# Patient Record
Sex: Male | Born: 1950 | Race: White | Hispanic: No | Marital: Married | Smoking: Current every day smoker
Health system: Southern US, Community
[De-identification: ages and names within clinical notes are randomized; demographics above are authoritative.]

## PROBLEM LIST (undated history)

## (undated) DIAGNOSIS — I639 Cerebral infarction, unspecified: Secondary | ICD-10-CM

## (undated) DIAGNOSIS — F0390 Unspecified dementia without behavioral disturbance: Secondary | ICD-10-CM

## (undated) DIAGNOSIS — D649 Anemia, unspecified: Secondary | ICD-10-CM

---

## 2009-12-19 ENCOUNTER — Emergency Department (HOSPITAL_COMMUNITY): Admission: EM | Admit: 2009-12-19 | Discharge: 2009-12-19 | Payer: Self-pay | Admitting: Emergency Medicine

## 2021-04-25 ENCOUNTER — Other Ambulatory Visit: Payer: Self-pay | Admitting: Family Medicine

## 2021-04-25 ENCOUNTER — Ambulatory Visit
Admission: RE | Admit: 2021-04-25 | Discharge: 2021-04-25 | Disposition: A | Discharge status: Home / Self Care | Payer: Medicare Other | Source: Ambulatory Visit | Attending: Family Medicine | Admitting: Family Medicine

## 2021-04-25 DIAGNOSIS — Y92009 Unspecified place in unspecified non-institutional (private) residence as the place of occurrence of the external cause: Secondary | ICD-10-CM

## 2021-04-25 DIAGNOSIS — S0990XA Unspecified injury of head, initial encounter: Secondary | ICD-10-CM

## 2021-04-25 DIAGNOSIS — W19XXXA Unspecified fall, initial encounter: Secondary | ICD-10-CM

## 2021-04-26 ENCOUNTER — Other Ambulatory Visit: Payer: Self-pay | Admitting: Family Medicine

## 2021-04-26 DIAGNOSIS — S0990XA Unspecified injury of head, initial encounter: Secondary | ICD-10-CM

## 2021-04-27 ENCOUNTER — Emergency Department (HOSPITAL_COMMUNITY): Payer: Medicare Other

## 2021-04-27 ENCOUNTER — Encounter (HOSPITAL_COMMUNITY): Payer: Self-pay | Admitting: Emergency Medicine

## 2021-04-27 ENCOUNTER — Emergency Department (HOSPITAL_COMMUNITY)
Admission: EM | Admit: 2021-04-27 | Discharge: 2021-04-27 | Disposition: A | Discharge status: Home / Self Care | Payer: Medicare Other | Attending: Emergency Medicine | Admitting: Emergency Medicine

## 2021-04-27 DIAGNOSIS — W010XXA Fall on same level from slipping, tripping and stumbling without subsequent striking against object, initial encounter: Secondary | ICD-10-CM | POA: Diagnosis not present

## 2021-04-27 DIAGNOSIS — M6281 Muscle weakness (generalized): Secondary | ICD-10-CM | POA: Diagnosis not present

## 2021-04-27 DIAGNOSIS — M5442 Lumbago with sciatica, left side: Secondary | ICD-10-CM

## 2021-04-27 DIAGNOSIS — R32 Unspecified urinary incontinence: Secondary | ICD-10-CM | POA: Diagnosis not present

## 2021-04-27 DIAGNOSIS — Y92000 Kitchen of unspecified non-institutional (private) residence as  the place of occurrence of the external cause: Secondary | ICD-10-CM | POA: Diagnosis not present

## 2021-04-27 DIAGNOSIS — M5441 Lumbago with sciatica, right side: Secondary | ICD-10-CM

## 2021-04-27 DIAGNOSIS — M549 Dorsalgia, unspecified: Secondary | ICD-10-CM | POA: Diagnosis not present

## 2021-04-27 DIAGNOSIS — M48061 Spinal stenosis, lumbar region without neurogenic claudication: Secondary | ICD-10-CM

## 2021-04-27 LAB — CBC WITH DIFFERENTIAL/PLATELET
Abs Immature Granulocytes: 0.03 10*3/uL (ref 0.00–0.07)
Basophils Absolute: 0.1 10*3/uL (ref 0.0–0.1)
Basophils Relative: 1 %
Eosinophils Absolute: 0.1 10*3/uL (ref 0.0–0.5)
Eosinophils Relative: 1 %
HCT: 40.6 % (ref 39.0–52.0)
Hemoglobin: 14.4 g/dL (ref 13.0–17.0)
Immature Granulocytes: 0 %
Lymphocytes Relative: 30 %
Lymphs Abs: 2.7 10*3/uL (ref 0.7–4.0)
MCH: 33.6 pg (ref 26.0–34.0)
MCHC: 35.5 g/dL (ref 30.0–36.0)
MCV: 94.6 fL (ref 80.0–100.0)
Monocytes Absolute: 1.2 10*3/uL — ABNORMAL HIGH (ref 0.1–1.0)
Monocytes Relative: 13 %
Neutro Abs: 5 10*3/uL (ref 1.7–7.7)
Neutrophils Relative %: 55 %
Platelets: 309 10*3/uL (ref 150–400)
RBC: 4.29 MIL/uL (ref 4.22–5.81)
RDW: 12.4 % (ref 11.5–15.5)
WBC: 9 10*3/uL (ref 4.0–10.5)
nRBC: 0 % (ref 0.0–0.2)

## 2021-04-27 LAB — COMPREHENSIVE METABOLIC PANEL
ALT: 39 U/L (ref 0–44)
AST: 40 U/L (ref 15–41)
Albumin: 3.8 g/dL (ref 3.5–5.0)
Alkaline Phosphatase: 63 U/L (ref 38–126)
Anion gap: 10 (ref 5–15)
BUN: 12 mg/dL (ref 8–23)
CO2: 24 mmol/L (ref 22–32)
Calcium: 9.4 mg/dL (ref 8.9–10.3)
Chloride: 100 mmol/L (ref 98–111)
Creatinine, Ser: 0.95 mg/dL (ref 0.61–1.24)
GFR, Estimated: >60 mL/min (ref >60)
Glucose, Bld: 101 mg/dL — ABNORMAL HIGH (ref 70–99)
Potassium: 3.9 mmol/L (ref 3.5–5.1)
Sodium: 134 mmol/L — ABNORMAL LOW (ref 135–145)
Total Bilirubin: 1.4 mg/dL — ABNORMAL HIGH (ref 0.3–1.2)
Total Protein: 6.6 g/dL (ref 6.5–8.1)

## 2021-04-27 LAB — URINALYSIS, ROUTINE W REFLEX MICROSCOPIC
Bilirubin Urine: NEGATIVE
Glucose, UA: NEGATIVE mg/dL
Hgb urine dipstick: NEGATIVE
Ketones, ur: 5 mg/dL — AB
Leukocytes,Ua: NEGATIVE
Nitrite: NEGATIVE
Protein, ur: NEGATIVE mg/dL
Specific Gravity, Urine: 1.016 (ref 1.005–1.030)
pH: 5 (ref 5.0–8.0)

## 2021-04-27 LAB — MAGNESIUM: Magnesium: 1.9 mg/dL (ref 1.7–2.4)

## 2021-04-27 MED ORDER — PREDNISONE 20 MG PO TABS: ORAL_TABLET | ORAL | 0 refills | Status: DC

## 2021-04-27 MED ORDER — DEXAMETHASONE SODIUM PHOSPHATE 10 MG/ML IJ SOLN
10.0000 mg | Freq: Once | INTRAMUSCULAR | Status: AC
  Administered 2021-04-27: 10 mg via INTRAVENOUS
  Filled 2021-04-27: qty 1

## 2021-04-27 MED ORDER — LORAZEPAM 2 MG/ML IJ SOLN
0.5000 mg | Freq: Once | INTRAMUSCULAR | Status: AC
  Administered 2021-04-27: 0.5 mg via INTRAVENOUS
  Filled 2021-04-27: qty 1

## 2021-04-27 MED ORDER — GADOBUTROL 1 MMOL/ML IV SOLN
6.5000 mL | Freq: Once | INTRAVENOUS | Status: AC | PRN
  Administered 2021-04-27: 6.5 mL via INTRAVENOUS

## 2021-04-27 NOTE — ED Provider Notes (Signed)
Emergency Medicine Provider Triage Evaluation Note  Ernest Campbell , a 70 y.o. male  was evaluated in triage.  Pt complains of back pain.  States that 1 month ago he fell from atop his kitchen counter, hit his head, and landed on his back.  Was ambulatory and alert and oriented following, was not initially worked up for symptoms.  However, has been having more difficulty ambulating with tightness in bilateral lower extremities and decreased range of motion.  Had a head CT earlier this week that was negative.  Patient has now lost bladder control, which prompted ordering of outpatient MRI.  Patient was unable to tolerate this due to loud noise, was sent to the ER to receive MRI with sedation.   Review of Systems  Positive: Numbness, tingling, loss of bladder control Negative: Fevers, chills  Physical Exam  BP 134/74 (BP Location: Left Arm)   Pulse 89   Temp 97.9 F (36.6 C) (Oral)   Resp 16   Ht 5\' 7"  (1.702 m)   Wt 68 kg   SpO2 100%   BMI 23.49 kg/m  Gen:   Awake, no distress   Resp:  Normal effort  MSK:   Moves extremities without difficulty  Other:  Mild midline tenderness noted to thoracic and lumbar spine.  Medical Decision Making  Medically screening exam initiated at 6:57 PM.  Appropriate orders placed.  Ernest Campbell was informed that the remainder of the evaluation will be completed by another provider, this initial triage assessment does not replace that evaluation, and the importance of remaining in the ED until their evaluation is complete.     Jacob Moores 04/27/21 1900    04/29/21, DO 04/27/21 1950

## 2021-04-27 NOTE — ED Provider Notes (Signed)
[facility] EMERGENCY DEPARTMENT Provider Note   CSN: [id number] Arrival date & time: 04/27/21  1604     History Chief Complaint  Patient presents with   Ernest Campbell is a 70 y.o. male.  Patient presents to the emergency department for evaluation of back pain and falls.  Patient had a fall about 7 weeks ago in his kitchen.  He landed on his back and hit his head.  Over the past 3 weeks patient has had increasing weakness in his lower extremities, falls, and difficulty with ambulation.  He states that he has had a tight sensation in his legs.  Patient follows with [facility].  He was sent for a head CT 2 days ago which was negative for acute bleeding.  Patient had an MRI attempted today but could not tolerate procedure and was sent to the emergency department for work-up.  He denies headaches or neck pain.  No weakness, numbness, or tingling in his arms.  He reports weakness in his legs, especially when trying to pick up his legs.  It is present on both sides but worse on the right.  Denies numbness in his legs.  Patient has had some urinary incontinence at nighttime but feels that he is usually able to control his urine.  No fecal incontinence.  He has had some shortness of breath with exertion but no chest pain.  Wife remarks on steep physical decline over the past several weeks.      History reviewed. No pertinent past medical history.  There are no problems to display for this patient.   History reviewed. No pertinent surgical history.     No family history on file.     Home Medications Prior to Admission medications   Not on File    Allergies    Patient has no known allergies.  Review of Systems   Review of Systems  Constitutional:  Negative for fever and unexpected weight change.  HENT:  Negative for rhinorrhea and sore throat.   Eyes:  Negative for redness.  Respiratory:  Positive for shortness of breath. Negative for  cough.   Cardiovascular:  Negative for chest pain.  Gastrointestinal:  Negative for abdominal pain, constipation, diarrhea, nausea and vomiting.       Neg for fecal incontinence  Genitourinary:  Positive for enuresis (at night). Negative for difficulty urinating, dysuria, flank pain and hematuria.       Negative for urinary incontinence or retention  Musculoskeletal:  Positive for back pain and gait problem. Negative for myalgias.  Skin:  Negative for rash.  Neurological:  Positive for weakness. Negative for numbness and headaches.       Negative for saddle paresthesias    Physical Exam Updated Vital Signs BP 134/74 (BP Location: Left Arm)   Pulse 89   Temp 97.9 F (36.6 C) (Oral)   Resp 16   Ht 5\' 7"  (1.702 m)   Wt 68 kg   SpO2 100%   BMI 23.49 kg/m   Physical Exam Vitals and nursing note reviewed.  Constitutional:      General: He is not in acute distress.    Appearance: He is well-developed.  HENT:     Head: Normocephalic and atraumatic.  Eyes:     General:        Right eye: No discharge.        Left eye: No discharge.     Conjunctiva/sclera: Conjunctivae normal.  Cardiovascular:  Rate and Rhythm: Normal rate and regular rhythm.     Heart sounds: Normal heart sounds.  Pulmonary:     Effort: Pulmonary effort is normal.     Breath sounds: Normal breath sounds.  Abdominal:     Palpations: Abdomen is soft.     Tenderness: There is no abdominal tenderness. There is no right CVA tenderness or left CVA tenderness.  Musculoskeletal:        General: No tenderness. Normal range of motion.     Cervical back: Normal range of motion and neck supple.     Comments: No step-off noted with palpation of spine.   Skin:    General: Skin is warm and dry.  Neurological:     Mental Status: He is alert and oriented to person, place, and time.     Sensory: No sensory deficit.     Motor: No abnormal muscle tone.     Deep Tendon Reflexes: Reflexes are normal and symmetric. Reflexes  normal.     Comments: Patient was 4 out of 5 strength in lower extremities, weakness is worse on the right.  More pronounced weakness with hip flexion on the right.  No sensation deficit.  Normal bilateral patellar reflexes.  Patient is sitting on the side of the bed.  He is unable to stand to pivot to get into bed due to weakness in his legs.  He needed assistance from 2 people to position him in bed.  Psychiatric:        Mood and Affect: Mood normal.    ED Results / Procedures / Treatments   Labs (all labs ordered are listed, but only abnormal results are displayed) Labs Reviewed  CBC WITH DIFFERENTIAL/PLATELET - Abnormal; Notable for the following components:      Result Value   Monocytes Absolute 1.2 (*)    All other components within normal limits  COMPREHENSIVE METABOLIC PANEL - Abnormal; Notable for the following components:   Sodium 134 (*)    Glucose, Bld 101 (*)    Total Bilirubin 1.4 (*)    All other components within normal limits  URINALYSIS, ROUTINE W REFLEX MICROSCOPIC - Abnormal; Notable for the following components:   Ketones, ur 5 (*)    All other components within normal limits  MAGNESIUM    EKG None  Radiology MR THORACIC SPINE W WO CONTRAST  Result Date: 04/27/2021 CLINICAL DATA:  Low back pain, progressive neurologic deficit. EXAM: MRI THORACIC AND LUMBAR SPINE WITH AND WITHOUT CONTRAST TECHNIQUE: Multiplanar and multiecho pulse sequences of the thoracic and lumbar spine were obtained with and without intravenous contrast. CONTRAST:  6.5  ML Gadavist COMPARISON:  None. FINDINGS: Evaluation is somewhat limited by motion artifact. MRI THORACIC SPINE FINDINGS Alignment:  Physiologic. Vertebrae: No acute or chronic fracture, evidence of discitis, or suspicious bone lesion. Cord: Normal signal and morphology. Evaluation for spinal cord enhancement is somewhat limited by significant motion on postcontrast sequences; within this limitation, no abnormal enhancement is  seen. Paraspinal and other soft tissues: Negative. Disc levels: No significant spinal canal stenosis or neural foraminal narrowing. MRI LUMBAR SPINE FINDINGS Segmentation: 5 lumbar type vertebral bodies, with sacralization of L5. Alignment:  Physiologic. Vertebrae: No acute fracture, evidence of discitis, or suspicious osseous lesion. Endplate degenerative changes at L4-L5. no abnormal enhancement. Conus medullaris and cauda equina: Conus extends to the T12 level. Conus and cauda equina appear normal. No abnormal enhancement, although evaluation is somewhat limited by motion artifact affecting the post-contrast sequences. Paraspinal and other soft  tissues: Right renal cyst. Disc levels: T12-L1: Minimal disc bulge. No spinal canal stenosis or neural foraminal narrowing. L1-L2: Mild disc bulge. Mild facet arthropathy. No spinal canal stenosis or neural foraminal narrowing. L2-L3: Mild disc bulge. Mild facet arthropathy. No spinal canal stenosis or neural foraminal narrowing. L3-L4: Mild disc bulge. No spinal canal stenosis or neural foraminal narrowing. L4-L5: Severe disc height loss with broad-based disc bulge. Narrowing of the right lateral recess. Moderate facet arthropathy. No spinal canal stenosis. Mild left-greater-than-right neural foraminal narrowing. L5-S1: No significant disc bulge. Moderate facet arthropathy. No spinal canal stenosis or neural foraminal narrowing. IMPRESSION: MR THORACIC SPINE IMPRESSION Evaluation is somewhat limited by motion artifact. Within this limitation, no acute process in the thoracic spine. No acute or chronic fracture, spinal canal stenosis, or neural foraminal narrowing. MR LUMBAR SPINE IMPRESSION Evaluation is somewhat limited by motion artifact from within limitation, there are degenerative changes in the lumbar spine, worst at L4-L5, where there is significant disc height loss and mild left greater than right neural foraminal narrowing, as well as narrowing of the right  lateral recess, which could affect the descending right L5 nerve. No spinal canal stenosis. Electronically Signed   By: Wiliam Ke M.D.   On: 04/27/2021 22:02   MR Lumbar Spine W Wo Contrast  Result Date: 04/27/2021 CLINICAL DATA:  Low back pain, progressive neurologic deficit. EXAM: MRI THORACIC AND LUMBAR SPINE WITH AND WITHOUT CONTRAST TECHNIQUE: Multiplanar and multiecho pulse sequences of the thoracic and lumbar spine were obtained with and without intravenous contrast. CONTRAST:  6.5  ML Gadavist COMPARISON:  None. FINDINGS: Evaluation is somewhat limited by motion artifact. MRI THORACIC SPINE FINDINGS Alignment:  Physiologic. Vertebrae: No acute or chronic fracture, evidence of discitis, or suspicious bone lesion. Cord: Normal signal and morphology. Evaluation for spinal cord enhancement is somewhat limited by significant motion on postcontrast sequences; within this limitation, no abnormal enhancement is seen. Paraspinal and other soft tissues: Negative. Disc levels: No significant spinal canal stenosis or neural foraminal narrowing. MRI LUMBAR SPINE FINDINGS Segmentation: 5 lumbar type vertebral bodies, with sacralization of L5. Alignment:  Physiologic. Vertebrae: No acute fracture, evidence of discitis, or suspicious osseous lesion. Endplate degenerative changes at L4-L5. no abnormal enhancement. Conus medullaris and cauda equina: Conus extends to the T12 level. Conus and cauda equina appear normal. No abnormal enhancement, although evaluation is somewhat limited by motion artifact affecting the post-contrast sequences. Paraspinal and other soft tissues: Right renal cyst. Disc levels: T12-L1: Minimal disc bulge. No spinal canal stenosis or neural foraminal narrowing. L1-L2: Mild disc bulge. Mild facet arthropathy. No spinal canal stenosis or neural foraminal narrowing. L2-L3: Mild disc bulge. Mild facet arthropathy. No spinal canal stenosis or neural foraminal narrowing. L3-L4: Mild disc bulge. No  spinal canal stenosis or neural foraminal narrowing. L4-L5: Severe disc height loss with broad-based disc bulge. Narrowing of the right lateral recess. Moderate facet arthropathy. No spinal canal stenosis. Mild left-greater-than-right neural foraminal narrowing. L5-S1: No significant disc bulge. Moderate facet arthropathy. No spinal canal stenosis or neural foraminal narrowing. IMPRESSION: MR THORACIC SPINE IMPRESSION Evaluation is somewhat limited by motion artifact. Within this limitation, no acute process in the thoracic spine. No acute or chronic fracture, spinal canal stenosis, or neural foraminal narrowing. MR LUMBAR SPINE IMPRESSION Evaluation is somewhat limited by motion artifact from within limitation, there are degenerative changes in the lumbar spine, worst at L4-L5, where there is significant disc height loss and mild left greater than right neural foraminal narrowing, as well as narrowing  of the right lateral recess, which could affect the descending right L5 nerve. No spinal canal stenosis. Electronically Signed   By: Wiliam Ke M.D.   On: 04/27/2021 22:02    Procedures Procedures   Medications Ordered in ED Medications  dexamethasone (DECADRON) injection 10 mg (has no administration in time range)  LORazepam (ATIVAN) injection 0.5 mg (0.5 mg Intravenous Given 04/27/21 2009)  gadobutrol (GADAVIST) 1 MMOL/ML injection 6.5 mL (6.5 mLs Intravenous Contrast Given 04/27/21 2130)  gadobutrol (GADAVIST) 1 MMOL/ML injection 6.5 mL (6.5 mLs Intravenous Contrast Given 04/27/21 2134)    ED Course  I have reviewed the triage vital signs and the nursing notes.  Pertinent labs & imaging results that were available during my care of the patient were reviewed by me and considered in my medical decision making (see chart for details).  Patient seen and examined. Work-up initiated. MRIs ordered. Will attempt with dose of ativan.   Vital signs reviewed and are as follows: BP 134/74 (BP Location:  Left Arm)   Pulse 89   Temp 97.9 F (36.6 C) (Oral)   Resp 16   Ht 5\' 7"  (1.702 m)   Wt 68 kg   SpO2 100%   BMI 23.49 kg/m   MRI performed with findings as above.  Discussed findings with patient and wife at bedside.  Discussed admission if unable to walk versus discharge to home with close follow-up.  Both patient and wife are comfortable with discharge.  Patient has a rolling walker which he has been using.  Also family member coming into town tomorrow.  He will be closely monitored.  They are willing to follow-up with Ortho or neurosurgery.  Will give referral to neurosurgery.  Will give dose of Decadron 10 mg IV and started on tapered course at home.  Encouraged return to the emergency department with worsening weakness, inability to ambulate or transfer. Patient was counseled on back pain precautions and told to do activity as tolerated but do not lift, push, or pull.    Again reiterated importance of close follow-up and return with worsening.  The patient verbalizes understanding and agrees with the plan.    MDM Rules/Calculators/A&P                           Patient with back pain and lower extremity weakness, concern for cord etiology.  Patient symptoms are subacute at this point with gradual worsening in his gait and mobility.  Sent by orthopedics today given red flag symptoms including loss of bladder control at night.  He does have some weakness in his legs, right greater than left.  Normal reflexes.  No other warning symptoms of back pain including: fecal incontinence, urinary retention, night sweats, waking from sleep with back pain, unexplained fevers or weight loss, h/o cancer, IVDU, recent trauma.  MRI today without any emergent surgical conditions.  He does have lateral recess stenosis at L4/L5 with mild foraminal stenosis.  Otherwise, no cauda equina or other cord compression.  Lab work-up today with normal hemoglobin, UA, electrolytes and kidney function.  Patient is  motivated to go home.  His wife will continue to help take care of him and she is comfortable with discharge.  He has a rolling walker now and his sister is coming into town tomorrow to help.  Wife works at home.  Discussed importance of close follow-up.  They will either follow-up with their orthopedist or neurosurgery referral given.  Discussed signs  symptoms to return.    Final Clinical Impression(s) / ED Diagnoses Final diagnoses:  Acute bilateral low back pain with bilateral sciatica  Stenosis of lateral recess of lumbar spine    Rx / DC Orders ED Discharge Orders          Ordered    predniSONE (DELTASONE) 20 MG tablet        04/27/21 2303             Renne Crigler, PA-C 04/28/21 2346    Ernie Avena, MD 05/01/21 1513

## 2021-04-27 NOTE — ED Triage Notes (Signed)
Patient sent to [facility] from [facility] for evaluation of ongoing symptoms after a fall at the beginning of August. Patient was standing on kitchen counter when he tripped and fell to tile floor, hit his head on another counter top, and landed on his back. Wife states patient immediately stood back up after falling and did not lose consciousness. MRI attempted at [facility] but patient unable to tolerate noise of machine. Wife reports patient has had an increasing number of falls since original event. Patient alert, oriented, and in no apparent distress at this time.

## 2021-04-27 NOTE — Discharge Instructions (Addendum)
Please read and follow all provided instructions.  Your diagnoses today include:  1. Acute bilateral low back pain with bilateral sciatica   2. Stenosis of lateral recess of lumbar spine     Tests performed today include: Vital signs - see below for your results today MRI mid and lower spine - shows significant disc bulging at the L4/L5 level which may be causing some of the symptoms Blood cell counts (white, red, and platelets) Electrolytes  Kidney function test Urine test to check for infection  Medications prescribed:  Prednisone - steroid medicine   It is best to take this medication in the morning to prevent sleeping problems. If you are diabetic, monitor your blood sugar closely and stop taking Prednisone if blood sugar is over 300. Take with food to prevent stomach upset.   Take any prescribed medications only as directed.  Home care instructions:  Follow any educational materials contained in this packet Please rest, use ice or heat on your back for the next several days Do not lift, push, pull anything more than 10 pounds for the next week  Follow-up instructions: Please follow-up with your orthopedist or the neurosurgeon referral listed.   Return instructions:  SEEK IMMEDIATE MEDICAL ATTENTION IF YOU HAVE: New numbness, tingling, weakness, or problem with the use of your arms or legs Severe back pain not relieved with medications Loss control of your bowels or bladder Increasing pain in any areas of the body (such as chest or abdominal pain) Shortness of breath, dizziness, or fainting.  Worsening nausea (feeling sick to your stomach), vomiting, fever, or sweats Any other emergent concerns regarding your health   Additional Information:  Your vital signs today were: BP 107/65 (BP Location: Right Arm)   Pulse 95   Temp (!) 97.1 F (36.2 C) (Oral)   Resp 16   Ht 5\' 7"  (1.702 m)   Wt 68 kg   SpO2 96%   BMI 23.49 kg/m  If your blood pressure (BP) was elevated  above 135/85 this visit, please have this repeated by your doctor within one month. --------------

## 2021-05-10 ENCOUNTER — Other Ambulatory Visit: Payer: Self-pay | Admitting: Family Medicine

## 2021-05-10 DIAGNOSIS — R269 Unspecified abnormalities of gait and mobility: Secondary | ICD-10-CM

## 2021-05-10 DIAGNOSIS — R413 Other amnesia: Secondary | ICD-10-CM

## 2021-05-12 ENCOUNTER — Other Ambulatory Visit: Payer: Self-pay | Admitting: Neurological Surgery

## 2021-05-12 DIAGNOSIS — M4712 Other spondylosis with myelopathy, cervical region: Secondary | ICD-10-CM

## 2021-05-18 ENCOUNTER — Ambulatory Visit
Admission: RE | Admit: 2021-05-18 | Discharge: 2021-05-18 | Disposition: A | Discharge status: Home / Self Care | Payer: Medicare Other | Source: Ambulatory Visit | Attending: Neurological Surgery | Admitting: Neurological Surgery

## 2021-05-18 ENCOUNTER — Ambulatory Visit
Admission: RE | Admit: 2021-05-18 | Discharge: 2021-05-18 | Disposition: A | Discharge status: Home / Self Care | Payer: Medicare Other | Source: Ambulatory Visit | Attending: Family Medicine | Admitting: Family Medicine

## 2021-05-18 ENCOUNTER — Other Ambulatory Visit: Payer: Self-pay

## 2021-05-18 DIAGNOSIS — M4712 Other spondylosis with myelopathy, cervical region: Secondary | ICD-10-CM

## 2021-05-18 DIAGNOSIS — R413 Other amnesia: Secondary | ICD-10-CM

## 2021-05-18 DIAGNOSIS — R269 Unspecified abnormalities of gait and mobility: Secondary | ICD-10-CM

## 2021-05-18 MED ORDER — GADOBENATE DIMEGLUMINE 529 MG/ML IV SOLN
14.0000 mL | Freq: Once | INTRAVENOUS | Status: AC | PRN
  Administered 2021-05-18: 14 mL via INTRAVENOUS

## 2021-07-03 ENCOUNTER — Ambulatory Visit (INDEPENDENT_AMBULATORY_CARE_PROVIDER_SITE_OTHER): Payer: Medicare Other | Admitting: Neurology

## 2021-07-03 ENCOUNTER — Encounter: Payer: Self-pay | Admitting: Neurology

## 2021-07-03 ENCOUNTER — Other Ambulatory Visit: Payer: Self-pay

## 2021-07-03 VITALS — BP 113/62 | HR 82 | Ht 65.0 in | Wt 141.0 lb

## 2021-07-03 DIAGNOSIS — R441 Visual hallucinations: Secondary | ICD-10-CM

## 2021-07-03 DIAGNOSIS — Z87898 Personal history of other specified conditions: Secondary | ICD-10-CM | POA: Insufficient documentation

## 2021-07-03 DIAGNOSIS — H510 Palsy (spasm) of conjugate gaze: Secondary | ICD-10-CM

## 2021-07-03 DIAGNOSIS — G218 Other secondary parkinsonism: Secondary | ICD-10-CM | POA: Insufficient documentation

## 2021-07-03 DIAGNOSIS — S069X0S Unspecified intracranial injury without loss of consciousness, sequela: Secondary | ICD-10-CM

## 2021-07-03 DIAGNOSIS — R2689 Other abnormalities of gait and mobility: Secondary | ICD-10-CM | POA: Insufficient documentation

## 2021-07-03 DIAGNOSIS — S069XAA Unspecified intracranial injury with loss of consciousness status unknown, initial encounter: Secondary | ICD-10-CM | POA: Insufficient documentation

## 2021-07-03 DIAGNOSIS — N3944 Nocturnal enuresis: Secondary | ICD-10-CM

## 2021-07-03 MED ORDER — CARBIDOPA-LEVODOPA 25-100 MG PO TABS: 1.0000 | ORAL_TABLET | Freq: Three times a day (TID) | ORAL | 5 refills | Status: DC

## 2021-07-03 NOTE — Patient Instructions (Signed)
Normal-Pressure Hydrocephalus Normal-pressure hydrocephalus (NPH) is a buildup of fluid (cerebrospinal fluid, or CSF) inside the brain. This does not typically increase the pressure inside the brain (intracranial pressure). CSF is normally present in the brain, but when too much CSF is produced in the brain, it can affect brain function. NPH usually occurs in people who are older than age 68. Many symptoms of NPH are also associated with aging or certain medical conditions. It is important to pay attention to changes in behavior and mental function. What are the causes? This condition may be caused by anything that blocks the flow of CSF, such as: Head injury. Infections. Bleeding from a blood vessel in the brain. In many cases, the cause of this condition is not known. What are the signs or symptoms? Symptoms of this condition may include: Difficulty walking, such as: Shuffling feet when walking. Unsteadiness. Problems when beginning to walk. Feet feeling as though they are stuck or "frozen" to the floor. Problems with bowel and bladder control. Problems with memory and thinking, such as: Forgetfulness. Lack of concentration. Mood problems. Confusion. How is this diagnosed? This condition is diagnosed based on: Your medical history. A physical exam. This can reveal walking (gait) changes or twitching or sudden muscle tightening (spasms) in the legs. Other tests to confirm the diagnosis and identify the best options for treatment. These tests include: Removal and examination of a large amount of CSF (large-volume lumbar puncture). CT scan of your head. MRI scan of your head. Cisternogram of your head. This test involves a lumbar puncture where a radioactive dye is put into your CSF. Nuclear medicine images are taken to see how well the dye is flowing through the network of cavities within your brain (ventricles). How is this treated? This condition may be treated with surgery in which  a narrow tube (ventriculoperitoneal shunt, or VP shunt) is placed in the brain to drain excess CSF into another part of the body. After a shunt is placed, you will be monitored to make sure CSF is draining properly. Depending on your age and overall health condition, you may not be a candidate for surgery. In some cases, a shunt may be placed in the lumbar spine (lumbar peritoneal shunt) instead of the brain. If your symptoms are mild, your condition may be monitored on a regular basis before a decision is made about whether a shunt is needed. Follow these instructions at home: Medicines Take over-the-counter and prescription medicines only as told by your health care provider. Avoid taking medicines that can affect thinking, such as pain or sleeping medicines. Lifestyle Do not use any products that contain nicotine or tobacco. These products include cigarettes, chewing tobacco, and vaping devices, such as e-cigarettes. If you need help quitting, ask your health care provider. Drink enough fluid to keep your urine pale yellow. If you have a shunt: Do not wear tight-fitting hats or headgear. Before having an MRI or abdominal surgery, tell your health care provider that you have a shunt. Watch for signs of infection, such as: Fever. Redness, pain, or swelling of the skin along the shunt path. Headache or stiff neck. Nausea or vomiting. General instructions Work with your health care provider to determine what you need help with and what your safety needs are. Ask your health care provider when you can return to your normal activities. Keep all follow-up visits. This is important. Contact a health care provider if: You have any new symptoms. Your symptoms get worse. Your symptoms do not  improve after surgery. You lose coordination or balance. You or your family members become concerned for your safety. Get help right away if: You have: A fever or other signs of infection. New or worsening  confusion. New or worsening sleepiness. A hard time staying awake. A headache that is getting worse. Blurred vision, especially early in the morning. You start to twitch or shake (seizure). These symptoms may represent a serious problem that is an emergency. Do not wait to see if the symptoms will go away. Get medical help right away. Call your local emergency services (911 in the [place] Do not drive yourself to the hospital. Summary Normal-pressure hydrocephalus (NPH) is a buildup of fluid (cerebrospinal fluid, or CSF) inside the brain. When too much CSF is produced in the brain, it can affect brain function. Anything that blocks the flow of CSF can cause this condition. In some cases, the cause of this condition is not known. This condition may be treated with surgery in which a narrow tube (ventriculoperitoneal shunt, or VP shunt) is placed in the brain to drain excess CSF into another part of the body. This information is not intended to replace advice given to you by your health care provider. Make sure you discuss any questions you have with your health care provider. Document Revised: 06/20/2020 Document Reviewed: 06/20/2020 Elsevier Patient Education  2022 Elsevier Inc. Carbidopa; Levodopa Tablets What is this medication? CARBIDOPA; LEVODOPA (kar bi DOE pa; lee voe DOE pa) treats the symptoms of Parkinson disease. It works by increasing the amount of dopamine in your brain, a substance which helps manage body movements and coordination. This reduces the symptoms of Parkinson, such as body stiffness and tremors. This medicine may be used for other purposes; ask your health care provider or pharmacist if you have questions. COMMON BRAND NAME(S): Atamet, Dhivy, SINEMET What should I tell my care team before I take this medication? They need to know if you have any of these conditions: Depression or other mental illness Diabetes Glaucoma Heart disease, including history of a heart  attack History of irregular heartbeat Kidney disease Liver disease Lung or breathing disease, like asthma Narcolepsy Sleep apnea Stomach or intestine problems An unusual or allergic reaction to levodopa, carbidopa, other medications, foods, dyes, or preservatives Pregnant or trying to get pregnant Breast-feeding How should I use this medication? Take this medication by mouth with a glass of water. Follow the directions on the prescription label. Take your doses at regular intervals. Do not take your medication more often than directed. Do not stop taking except on the advice of your care team. One form of this medication is a breakable tablet. If your care team has prescribed this, this medication comes with INSTRUCTIONS FOR USE. Ask your pharmacist for directions on how to use this medicine. Read the information carefully. Talk to your pharmacist or care team if you have questions. Talk to your care team about the use of this medication in children. Special care may be needed. Overdosage: If you think you have taken too much of this medicine contact a poison control center or emergency room at once. NOTE: This medicine is only for you. Do not share this medicine with others. What if I miss a dose? If you miss a dose, take it as soon as you can. If it is almost time for your next dose, take only that dose. Do not take double or extra doses. What may interact with this medication? Do not take this medication with any  of the following: MAOIs like Marplan, Nardil, and Parnate Reserpine Tetrabenazine This medication may also interact with the following: Alcohol Droperidol Entacapone Iron supplements or multivitamins with iron Isoniazid, INH Linezolid Medications for depression, anxiety, or psychotic disturbances Medications for high blood pressure Medications for sleep Metoclopramide Papaverine Procarbazine Tedizolid Rasagiline Selegiline Tolcapone This list may not describe all  possible interactions. Give your health care provider a list of all the medicines, herbs, non-prescription drugs, or dietary supplements you use. Also tell them if you smoke, drink alcohol, or use illegal drugs. Some items may interact with your medicine. What should I watch for while using this medication? Visit your care team for regular checks on your progress. Tell your care team if your symptoms do not start to get better or if they get worse. Do not stop taking except on your care team's advice. You may develop a severe reaction. Your care team will tell you how much medication to take. You may experience a wearing of effect prior to the time for your next dose of this medication. You may also experience an on-off effect where the medication apparently stops working for anything from a minute to several hours, then suddenly starts working again. Tell your care team if any of these symptoms happen to you. Your dose may need to be changed. A high protein diet can slow or prevent absorption of this medication. Avoid high protein foods near the time of taking this medication to help to prevent these problems. Take this medication at least 30 minutes before eating or one hour after meals. You may want to eat higher protein foods later in the day or in small amounts. Discuss your diet with your care team. You may get drowsy or dizzy. Do not drive, use machinery, or do anything that needs mental alertness until you know how this medication affects you. Do not stand or sit up quickly, especially if you are an older patient. This reduces the risk of dizzy or fainting spells. Alcohol may interfere with the effect of this medication. Avoid alcoholic drinks. When taking this medication, you may fall asleep without notice. You may be doing activities like driving a car, talking, or eating. You may not feel drowsy before it happens. Contact your care team right away if this happens to you. There have been reports of  increased sexual urges or other strong urges such as gambling while taking this medication. If you experience any of these while taking this medication, you should report this to your care team as soon as possible. If you are diabetic, this medication may interfere with the accuracy of some tests for sugar or ketones in the urine (does not interfere with blood tests). Check with your care team before changing the dose of your diabetic medication. This medication may discolor the urine or sweat, making it look darker or red in color. This is of no cause for concern. However, this may stain clothing or fabrics. This medication may cause a decrease in vitamin B6. You should make sure that you get enough vitamin B6 while you are taking this medication. Discuss the foods you eat and the vitamins you take with your care team. What side effects may I notice from receiving this medication? Side effects that you should report to your care team as soon as possible: Allergic reactions--skin rash, itching, hives, swelling of the face, lips, tongue, or throat Falling asleep during daily activities Heart rhythm changes--fast or irregular heartbeat, dizziness, feeling faint or lightheaded, chest  pain, trouble breathing Low blood pressure--dizziness, feeling faint or lightheaded, blurry vision Mood and behavior changes--anxiety, nervousness, confusion, hallucinations, irritability, hostility, thoughts of suicide or self-harm, worsening mood, feelings of depression New or worsening uncontrolled and repetitive movements of the face, mouth, or upper body Stomach bleeding--bloody or black, tar-like stools, vomiting blood or brown material that looks like coffee grounds Sudden eye pain or change in vision such as blurry vision, seeing halos around lights, vision loss Urges to engage in impulsive behaviors such as gambling, binge eating, sexual activity, or shopping in ways that are unusual for you Side effects that  usually do not require medical attention (report to your care team if they continue or are bothersome): Dark red or black saliva, sweat, or urine Dizziness Drowsiness Headache Nausea This list may not describe all possible side effects. Call your doctor for medical advice about side effects. You may report side effects to FDA at 1-800-FDA-1088. Where should I keep my medication? Keep out of the reach of children. Store at room temperature between 15 and 30 degrees C (59 and 86 degrees F). Protect from light. Throw away any unused medication after the expiration date. NOTE: This sheet is a summary. It may not cover all possible information. If you have questions about this medicine, talk to your doctor, pharmacist, or health care provider.  2022 Elsevier/Gold Standard (2021-02-13 00:00:00)

## 2021-07-03 NOTE — Progress Notes (Signed)
[facility]EP [facility]INE [facility]    Provider:  Melvyn Novas, MD  Primary Care Physician:  Pcp, No No address on file     Referring Provider: Dawley, Alan Mulder, Do [address] [place],  [place] [address]          Chief Complaint according to patient   Patient presents with:     New Patient (Initial Visit)           HISTORY OF PRESENT ILLNESS:  Ernest Campbell is a 70 y.o. year old White or Caucasian male patient seen here in the presence of his spouse, upon consultation request/  referral on 07/03/2021 from PCP .   Chief concern according to patient's wife: The patient fell from a kitchen counter on which she worked backwards and hit his head so this was a quite significant traumatic impact also initially neither his personality nor his abilities for change.  Within 3 weeks or so the spells noted a decline in gait stability, some more forgetfulness and memory lapses and also a change in facial expression.  He was not himself.  The patient presentedv to his orthopedic surgeon Dr. Althea Charon.  From there he was referred for an MRI of the brain but he could not tolerate lying flat in the loud machine and therefore had to do it under sedation at the hospital.  At the hospital there was also an MRI of the spine all 3 compartments were reviewed cervical, lumbar and thoracic.  He was treated with steroids also I am not sure what the treated disease was.  He did have some lower back pain. 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  Rather mild canal stenosis.  Based on these findings and May 18, 2021 the patient had been started on steroids.  He was started I on steroids and became delirious. S he wouldn't sleep and would crawl on the floor, was not responding to verbal stimuli.     Ernest Campbell has no medical HISTORY abstracted in EPIC.     The  patent is a smoker, he hasn't been drinking alcohol  for 2 decades.  Retired from  [employer], since 2016.at age 60, worked outdoors a lot. Caffeine - 2 coffees a day, in AM.   Family medical /sleep history: no  other family member on CPAP with OSA, insomnia, sleep walkers.    Social history:  Patient is lives in a household with spouse, childless,  married for 40 years.  Pets; one cat. Tobacco use yes.  ETOH use moderate not in years.        Review of Systems: Out of a complete 14 system review, the patient complains of only the following symptoms, and all other reviewed systems are negative.:   Memory, falls and urinary incontinence. NPH post fall ?    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  Dentures.  Masked face.  Edentulous.  Laquered tongue.    Falls. Shuffling.  Bedwetting  Misplacing things, trouble folding clothes.   Social History   Socioeconomic History   Marital status: Married    Spouse name: Not on file   Number of children: Not on file   Years of education: Not on file   Highest education level: Not on file  Occupational History   Not on file  Tobacco Use  Smoking status: Every Day    Packs/day: 0.25    Types: Cigarettes   Smokeless tobacco: Not on file  Substance and Sexual Activity   Alcohol use: Never   Drug use: Never   Sexual activity: Not on file  Other Topics Concern   Not on file  Social History Narrative   Not on file   Social Determinants of Health   Financial Resource Strain: Not on file  Food Insecurity: Not on file  Transportation Needs: Not on file  Physical Activity: Not on file  Stress: Not on file  Social Connections: Not on file    Family History  Problem Relation Age of Onset   Dementia Mother    Parkinsonism Father    Breast cancer Sister     No past medical history on file.  No past surgical history on file.   Current Outpatient Medications  on File Prior to Visit  Medication Sig Dispense Refill   ibuprofen (ADVIL) 200 MG tablet Take 200 mg by mouth every 6 (six) hours as needed.     No current facility-administered medications on file prior to visit.    Allergies  Allergen Reactions   Prednisone Other (See Comments)    Physical exam:  Today's Vitals   07/03/21 1422  BP: 113/62  Pulse: 82  Weight: 141 lb (64 kg)  Height: 5\' 5"  (1.651 m)   Body mass index is 23.46 kg/m.   Wt Readings from Last 3 Encounters:  07/03/21 141 lb (64 kg)  04/27/21 150 lb (68 kg)     Ht Readings from Last 3 Encounters:  07/03/21 5\' 5"  (1.651 m)  04/27/21 5\' 7"  (1.702 m)      General: The patient is awake, alert and appears not in acute distress. The patient is well groomed. Head: Normocephalic, atraumatic. Neck is supple.   Mallampati 2,  neck circumference:15 inches . Nasal airflow barely patent.  Retrognathia is  seen.  Dental status: none  Cardiovascular:  Regular rate and cardiac rhythm by pulse,  without distended neck veins. Respiratory: Lungs are clear to auscultation.  Skin:  Without evidence of ankle edema, or rash. Trunk: The patient's posture is erect.   Neurologic exam : The patient is awake and alert, oriented to place and time.   Memory subjective described as  impaired.     MMSE - Mini Mental State Exam 07/03/2021  Orientation to time 5  Orientation to Place 4  Registration 3  Attention/ Calculation 2  Recall 1  Language- name 2 objects 2  Language- repeat 1  Language- follow 3 step command 3  Language- read & follow direction 1  Write a sentence 1  Copy design 0  Total score 23    Attention span & concentration ability not assessed.    Speech with  dysarthria, with dysphonia , and possibly aphasia.  Mood and affect are aloof .   Cranial nerves: no loss of smell or taste reported  Pupils are unequal , left 2 mm, right 3 mm, and briskly reactive to light. Funduscopic exam deferred.     Extraocular movements in vertical and horizontal planes were coatsely saccadic not smooth intact and without nystagmus. No Diplopia. Visual fields by finger perimetry are intact. Hearing was intact to soft voice and finger rubbing.   Facial sensation intact to fine touch. Facial motor strength is symmetric and tongue and uvula move midline.  Neck ROM : rotation, tilt and flexion extension were normal for age and shoulder shrug was  symmetrical.    Motor exam:  Symmetric bulk, tone and ROM.   Normal tone without cog-wheeling, symmetric grip strength.   Sensory:  Fine touch, and vibration were reduced more in the left ankle and patella than on the right.   Proprioception tested in the upper extremities was normal.   Coordination: Rapid alternating movements in the fingers/hands were of normal speed.  The Finger-to-nose maneuver was intact without evidence of ataxia, dysmetria or tremor.   Gait and station: Patient could rise assisted from a seated position, he stood up but couldn't straight himself, stooped posture, delayed initiation of movement,   walked without assistive device. He is shuffling , has no arm-swing  He turned with 5 steps and had to hold on to the left wall- , and no visible tremor. Stance is stooped, arms flexed in front of body-  Toe and heel walk were deferred.  Deep tendon reflexes: in the upper and lower extremities are symmetric -  Babinski response was down going bilaterally.        After spending a total time of 60 minutes face to face and additional time for physical and neurologic examination, review of laboratory studies,  personal review of imaging studies, reports and results of other testing and review of referral information / records as far as provided in visit, I have established the following assessments:  I reviewed the [facility] notes from Sherrilyn Rist MD from 11-17 2022 who had seen the patient for back pain trouble walking buttock pain.   Lumbar axial myofascial pain verbose noted.  There were degenerative changes in the lumbar spine the worst at L4 and L5 is significant loss of height.  The right-sided neural foraminal narrowing was much greater than on the left.  No canal stenosis was noted.  As to the patient's thoracic spine severe disc height loss was not noted evaluation was somewhat limited by motion artifact.  No acute or chronic fracture or spinal canal stenosis was seen.  Patient also underwent cervical spine MRI without contrast severe bilateral foraminal stenosis at C5-C6 foraminal stenosis on the left C4-C5 on the right at C6-C7 and moderate canal stenosis.  The study was read by Mervyn Skeeters on 05-18-2021.   Now becoming to the brain MRI 05/18/2021 normal arterial flow voids were noted there was no evidence of acute intracranial abnormality only a right maxillary sinus retention and anterior right ethmoid air cells were noted.  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  No enhancement was noted.  I reviewed the study with the patient and see no NPH signs, some atrophy of the sulcus.  CT looks as if brain has swelling.   1) visual hallucinations.  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 He began having urinary incontinence.  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  4) MMSE  memory test was abnormal, he had no comparison tests/ studies from PCP or neurosurgeon.    My Plan is to proceed with: CT repeat to allow for spinal tap- opening pressure under fluoroscopy. He needs to be in hospital for testing, cannot tolerate MRI easliy.   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  3)   differential diagnosis is the possible Lewy Body dementia, his mother had the same condition.   I would like to thank Dawley, Alan Mulder, [address] [place],  [place] [address] for allowing me to meet with and to take care of this pleasant patient.   In short, Ernest Campbell is presenting with the possible mentioned symptoms,    I plan to follow up either personally or through our NP within 3-4 month. MMSE repeat.   CC: I will share my notes with PCP.  Electronically signed by: Melvyn Novas, MD 07/03/2021 2:35 PM  [facility] Board certified by The [employer] of Sleep Medicine and Diplomate of the [employer] of Sleep Medicine. Board certified In Neurology through the ABPN, Fellow of the [employer] of Neu[employer]. Medical Director of [facility].

## 2021-07-03 NOTE — Addendum Note (Signed)
Addended by: Judi Cong on: 07/03/2021 03:49 PM   Modules accepted: Orders

## 2021-07-04 ENCOUNTER — Other Ambulatory Visit: Payer: Self-pay | Admitting: *Deleted

## 2021-07-04 ENCOUNTER — Encounter: Payer: Self-pay | Admitting: Neurology

## 2021-07-04 DIAGNOSIS — R2689 Other abnormalities of gait and mobility: Secondary | ICD-10-CM

## 2021-07-04 DIAGNOSIS — R441 Visual hallucinations: Secondary | ICD-10-CM

## 2021-07-04 DIAGNOSIS — G218 Other secondary parkinsonism: Secondary | ICD-10-CM

## 2021-07-04 DIAGNOSIS — S069X0S Unspecified intracranial injury without loss of consciousness, sequela: Secondary | ICD-10-CM

## 2021-07-04 DIAGNOSIS — Z87898 Personal history of other specified conditions: Secondary | ICD-10-CM

## 2021-07-04 DIAGNOSIS — H510 Palsy (spasm) of conjugate gaze: Secondary | ICD-10-CM

## 2021-07-04 LAB — COMPREHENSIVE METABOLIC PANEL
ALT: 14 IU/L (ref 0–44)
AST: 14 IU/L (ref 0–40)
Albumin/Globulin Ratio: 2 (ref 1.2–2.2)
Albumin: 4.3 g/dL (ref 3.8–4.8)
Alkaline Phosphatase: 75 IU/L (ref 44–121)
BUN/Creatinine Ratio: 17 (ref 10–24)
BUN: 19 mg/dL (ref 8–27)
Bilirubin Total: 0.3 mg/dL (ref 0.0–1.2)
CO2: 22 mmol/L (ref 20–29)
Calcium: 9.4 mg/dL (ref 8.6–10.2)
Chloride: 104 mmol/L (ref 96–106)
Creatinine, Ser: 1.1 mg/dL (ref 0.76–1.27)
Globulin, Total: 2.1 g/dL (ref 1.5–4.5)
Glucose: 99 mg/dL (ref 70–99)
Potassium: 4.8 mmol/L (ref 3.5–5.2)
Sodium: 138 mmol/L (ref 134–144)
Total Protein: 6.4 g/dL (ref 6.0–8.5)
eGFR: 73 mL/min/{1.73_m2} (ref >59)

## 2021-07-04 MED ORDER — ALPRAZOLAM 0.25 MG PO TABS: ORAL_TABLET | ORAL | 0 refills | Status: DC

## 2021-07-04 NOTE — Telephone Encounter (Signed)
Called and spoke w/ Ernest Campbell at [facility] (PT). He is already scheduled for pre-evaluation at 12:30pm on 07/13/21 (1 hour prior to LP). She would like [place] to let pt know to check in between 12-1230p. When he gets to [facility], he should go to entrance A off of [address] [employer] parking. Go through double doors and let them know at desk he is there for LP. They will check him in and bring him to radiology. He should be called by radiology this week or early next week to do pre-screening.

## 2021-07-04 NOTE — Progress Notes (Signed)
Ordered prn Xanax  pre imaging study

## 2021-07-04 NOTE — Progress Notes (Signed)
All normal metabolic panel results

## 2021-07-05 ENCOUNTER — Other Ambulatory Visit: Payer: Self-pay | Admitting: Physician Assistant

## 2021-07-13 ENCOUNTER — Ambulatory Visit (HOSPITAL_COMMUNITY)
Admission: RE | Admit: 2021-07-13 | Discharge: 2021-07-13 | Disposition: A | Discharge status: Home / Self Care | Payer: Medicare Other | Source: Ambulatory Visit | Attending: Neurology | Admitting: Neurology

## 2021-07-13 ENCOUNTER — Ambulatory Visit: Payer: Medicare Other | Admitting: Neurology

## 2021-07-13 ENCOUNTER — Other Ambulatory Visit: Payer: Self-pay

## 2021-07-13 ENCOUNTER — Other Ambulatory Visit: Payer: Self-pay | Admitting: Student

## 2021-07-13 DIAGNOSIS — H510 Palsy (spasm) of conjugate gaze: Secondary | ICD-10-CM

## 2021-07-13 DIAGNOSIS — G218 Other secondary parkinsonism: Secondary | ICD-10-CM | POA: Insufficient documentation

## 2021-07-13 DIAGNOSIS — N3944 Nocturnal enuresis: Secondary | ICD-10-CM

## 2021-07-13 DIAGNOSIS — R2689 Other abnormalities of gait and mobility: Secondary | ICD-10-CM

## 2021-07-13 DIAGNOSIS — Z87898 Personal history of other specified conditions: Secondary | ICD-10-CM

## 2021-07-13 DIAGNOSIS — S069X0S Unspecified intracranial injury without loss of consciousness, sequela: Secondary | ICD-10-CM | POA: Insufficient documentation

## 2021-07-13 DIAGNOSIS — X58XXXS Exposure to other specified factors, sequela: Secondary | ICD-10-CM | POA: Insufficient documentation

## 2021-07-13 DIAGNOSIS — R441 Visual hallucinations: Secondary | ICD-10-CM | POA: Insufficient documentation

## 2021-07-13 LAB — CSF CELL COUNT WITH DIFFERENTIAL
RBC Count, CSF: 0 /mm3
Tube #: 3
WBC, CSF: 3 /mm3 (ref 0–5)

## 2021-07-13 LAB — GLUCOSE, CSF: Glucose, CSF: 65 mg/dL (ref 40–70)

## 2021-07-13 LAB — PROTEIN, CSF: Total  Protein, CSF: 47 mg/dL — ABNORMAL HIGH (ref 15–45)

## 2021-07-13 MED ORDER — LIDOCAINE HCL (PF) 1 % IJ SOLN
5.0000 mL | Freq: Once | INTRAMUSCULAR | Status: AC
  Administered 2021-07-13: 5 mL via INTRADERMAL

## 2021-07-13 NOTE — Progress Notes (Signed)
Normal opening pressure, 28 ml were removed CSF testing is documented separately.

## 2021-07-13 NOTE — Procedures (Signed)
Technically successful L3/L4 lumbar puncture yielding 28 ml clear CSF for laboratory studies.  Patient also scheduled for non-contrast MRI today and post-LP physical therapy examination.   Alwyn Ren, [place] [phone] 07/13/2021, 2:11 PM

## 2021-07-13 NOTE — Progress Notes (Signed)
Motion degraded examination, as described and limiting evaluation. No evidence of acute intracranial abnormality. 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 Severe right maxillary sinusitis.

## 2021-07-14 ENCOUNTER — Telehealth: Payer: Self-pay | Admitting: Psychiatry

## 2021-07-14 NOTE — Telephone Encounter (Signed)
Received call from lab regarding gram positive rods on CSF gram stain. On discussion with lab finding was favored to be contaminant. CSF culture preliminarily negative for growth (<24 hours). Routine analysis noninfectious with 3 WBC, normal glucose. Patient afebrile on recorded vitals 12/6 with temperature of 97.4 F.  Victorino Dike Keeshawn Fakhouri 07/14/21 3:34 PM

## 2021-07-15 LAB — VDRL, CSF: VDRL Quant, CSF: NONREACTIVE

## 2021-07-17 ENCOUNTER — Telehealth: Payer: Self-pay | Admitting: *Deleted

## 2021-07-17 DIAGNOSIS — N3944 Nocturnal enuresis: Secondary | ICD-10-CM

## 2021-07-17 DIAGNOSIS — R441 Visual hallucinations: Secondary | ICD-10-CM

## 2021-07-17 DIAGNOSIS — G218 Other secondary parkinsonism: Secondary | ICD-10-CM

## 2021-07-17 DIAGNOSIS — R2689 Other abnormalities of gait and mobility: Secondary | ICD-10-CM

## 2021-07-17 DIAGNOSIS — H510 Palsy (spasm) of conjugate gaze: Secondary | ICD-10-CM

## 2021-07-17 DIAGNOSIS — S069X0S Unspecified intracranial injury without loss of consciousness, sequela: Secondary | ICD-10-CM

## 2021-07-17 DIAGNOSIS — Z87898 Personal history of other specified conditions: Secondary | ICD-10-CM

## 2021-07-17 LAB — CSF CULTURE W GRAM STAIN: Culture: NO GROWTH

## 2021-07-17 NOTE — Telephone Encounter (Signed)
Called and spoke w/ Ernest Campbell (wife). Relayed results:  MRI brain: "Motion degraded examination, as described and limiting evaluation.  No evidence of acute intracranial abnormality.  Moderate chronic small vessel ischemic changes within the cerebral  white matter, not appreciably changed from the brain MRI of 05/18/2021.   Severe right maxillary sinusitis."  LP/CSF: "Mildly elevated protein, normal glucose in CSF and only 3 cells- normal   (not NPH by low opening pressure )" "Normal opening pressure, 28 ml were removed CSF testing is documented separately." "Slight elevation of protein in CSF , no cells- need RV for rule out of GBS or CIDP"   Per Dr. Vickey Huger, she would like to r/o GBS or CIDP via EMG/NCS. She would like him to complete this first and then come in for OV after that to discuss treatment plan. Aware I will have someone reach out to her to schedule EMG/NCS for husband.

## 2021-07-17 NOTE — Telephone Encounter (Signed)
-----   Message from Melvyn Novas, MD sent at 07/17/2021  8:39 AM EST ----- Slight elevation of protein in CSF , no cells- need RV for rule out of GBS or AIDP

## 2021-07-17 NOTE — Telephone Encounter (Signed)
Received the report from the PT that completed testing on the day of the LP procedure. I will provide to Dr Vickey Huger to review and contact pt with results after reviewed.

## 2021-07-17 NOTE — Telephone Encounter (Signed)
Contaminant bacterial culture- no indication of CSF infection.   The pre and post spinal tap evaluation by physical therapy revealed no benefit.  Has a history of urinary incontinence sometimes usually at night, cognitive deficits and restrictive gait disturbance with a problem to initiate movement.  There was no improvement of heel slides, or finger-to-nose coordination testing.  Gait speed was minimally improved.  The fragmentation of turns was even worse after LP.  Mini-Mental status exam revealed 23 out of 30 points and post LP 24 out of 30 points.  Overall no improvement not a candidate for any VP shunt intervention, the differential diagnosis of normal pressure hydrocephalus has been ruled out.

## 2021-07-17 NOTE — Telephone Encounter (Signed)
Took call from phone staff and spoke with Clifton Custard w/ [facility]. She wanted to make a correction to previous report given. She reviewed sample again and it showed:  Did NOT show any gram positive rods and there was NO culture growth

## 2021-09-14 ENCOUNTER — Ambulatory Visit (INDEPENDENT_AMBULATORY_CARE_PROVIDER_SITE_OTHER): Payer: Medicare Other | Admitting: Neurology

## 2021-09-14 DIAGNOSIS — R2689 Other abnormalities of gait and mobility: Secondary | ICD-10-CM | POA: Diagnosis not present

## 2021-09-14 DIAGNOSIS — M545 Low back pain, unspecified: Secondary | ICD-10-CM

## 2021-09-14 DIAGNOSIS — Z87898 Personal history of other specified conditions: Secondary | ICD-10-CM

## 2021-09-14 DIAGNOSIS — G5732 Lesion of lateral popliteal nerve, left lower limb: Secondary | ICD-10-CM

## 2021-09-14 DIAGNOSIS — N3944 Nocturnal enuresis: Secondary | ICD-10-CM

## 2021-09-14 DIAGNOSIS — S069X0S Unspecified intracranial injury without loss of consciousness, sequela: Secondary | ICD-10-CM

## 2021-09-14 DIAGNOSIS — R269 Unspecified abnormalities of gait and mobility: Secondary | ICD-10-CM | POA: Diagnosis not present

## 2021-09-14 DIAGNOSIS — R441 Visual hallucinations: Secondary | ICD-10-CM

## 2021-09-14 DIAGNOSIS — H510 Palsy (spasm) of conjugate gaze: Secondary | ICD-10-CM

## 2021-09-14 DIAGNOSIS — G218 Other secondary parkinsonism: Secondary | ICD-10-CM

## 2021-09-14 NOTE — Progress Notes (Signed)
Patient is here with his wife for EMG nerve conduction study referred by Dr. Porfirio Mylar Dohmeier.  Per Dr. Oliva Bustard notes and report from patient and wife, he has had a decline in gait stability, forgetfulness, and a change in facial expression.  He does have some lower back pain and he was seen at [facility] for low back pain radiating into the buttocks,lumbar axial myofascial pain, degenerative changes in the lumbar spine the worst at L4-L5 with significant loss of height, the right-sided neuroforaminal narrowing was much greater than the left.  On examination Dr. Vickey Huger noted a parkinsonian gait.  His lumbar puncture showed elevated protein, he was sent for EMG nerve conduction studies to evaluate for Guillain-Barr or AIDP.Feeling much better since having the lumbar puncture and starting dopamine. Memory has significantly improved, waking is "perfect" and he is driving now.   I reviewed results of emg/ncs with wife and patient. He has a history of low back pain which can explain the findings of possible remote L5/S1 radiculopathy. He appears asymptomatic for left-sided peroneal neuropathy. He has had lumbar injections which helped tremendously. Discussed peroneal neuropathy with wife and patient and conservative measures such as not crossing legs but he is asymptomatic, only some mild tenderness to palpation at the fibular head. He has also gained 16 pounds. They are extremely appreciative to Dr Vickey Huger. He does have low back pain which is also improved. They saw the neurosurgeon and injections helped.   I spent 20 minutes of face-to-face and non-face-to-face time with patient on the  1. Abnormal gait   2. Shuffling gait   3. Low back pain, unspecified back pain laterality, unspecified chronicity, unspecified whether sciatica present   4. Neuropathy of left peroneal nerve    diagnosis.  This included previsit chart review, lab review, study review, order entry, electronic health record documentation,  patient education on the different diagnostic and therapeutic options, counseling and coordination of care, risks and benefits of management, compliance, or risk factor reduction This does not include time spent on emg/ncs.

## 2021-09-18 NOTE — Procedures (Signed)
Full Name: Ernest Campbell Gender: Male MRN #: [id number] Date of Birth: Dec 20, 1950    Visit Date: 09/14/2021 09:47 Age: 71 Years Examining Physician: Naomie Dean, MD  Referring Physician: Melvyn Novas, MD  History: Decline in gait stability, lower back pain, here with his wife and stating he is much improved with Dr. Oliva Bustard treatment plan.   Summary: Nerve conduction studies were performed on the bilateral lower extremities.  The right peroneal motor nerve (recording from the EDB) showed reduced amplitude (1.7 mV, normal greater than 2).  The left peroneal motor nerve (recording from the EDB) showed reduced amplitude (1.1 mV, normal greater than 2).  The left peroneal motor nerve (recording from the tibialis anterior) showed a conduction block across the fibular head. All remaining nerves (as indicated in the following tables) were within normal limits.  All muscles (as indicated in the following tables) were within normal limits.     Conclusion: There is reduced amplitude of the bilateral EDB peroneal motor nerves along with normal sensory conductions suggestive of bilateral L5/S1 radiculopathy. 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 EMG needle study was however normal which could indicate that these are remote findings. MRI of the lumbar spine could be considered however patient is feeling extremely better.  Please see note dated same day with my feedback to patient.   ------------------------------- Naomie Dean M.D.  [facility] [address], [address] [place], [place] [address] Tel: [phone] Fax: [phone]  Verbal informed consent was obtained from the patient, patient was informed of potential risk of procedure, including bruising, bleeding, hematoma formation, infection, muscle  weakness, muscle pain, numbness, among others.        MNC    Nerve / Sites Muscle Latency Ref. Amplitude Ref. Rel Amp Segments Distance Velocity Ref. Area    ms ms mV mV %  cm m/s m/s mVms  R Peroneal - EDB     Ankle EDB 6.5 ?6.5 1.7 ?2.0 100 Ankle - EDB 9   6.3     Fib head EDB 12.9  1.6  91.5 Fib head - Ankle 28 44 ?44 7.2     Pop fossa EDB 15.1  1.3  85.6 Pop fossa - Fib head 10 44 ?44 6.3         Pop fossa - Ankle      L Peroneal - EDB     Ankle EDB 6.5 ?6.5 1.1 ?2.0 100 Ankle - EDB 9   5.3     Fib head EDB 12.6  1.0  95.3 Fib head - Ankle 27 44 ?44 4.8     Pop fossa EDB 14.9  0.8  83.2 Pop fossa - Fib head 10 44 ?44 3.9         Pop fossa - Ankle      R Tibial - AH     Ankle AH 3.2 ?5.8 14.1 ?4.0 100 Ankle - AH 9   37.8     Pop fossa AH 11.5  11.2  79.5 Pop fossa - Ankle 38 45 ?41 34.3  L Tibial - AH     Ankle AH 3.3 ?5.8 8.8 ?4.0 100 Ankle - AH 9   27.9     Pop fossa AH 12.3  6.6  74.3 Pop fossa - Ankle 39 44 ?41 24.7  R Peroneal - Tib Ant  Fib Head Tib Ant 2.6 ?4.7 4.6 ?3.0 100 Fib Head - Tib Ant 10   27.0     Pop fossa Tib Ant 4.7  5.1  111 Pop fossa - Fib Head 10 48 ?44 30.2  L Peroneal - Tib Ant     Fib Head Tib Ant 2.3 ?4.7 5.4 ?3.0 100 Fib Head - Tib Ant 10   44.9     Pop fossa Tib Ant NR  NR  NR Pop fossa - Fib Head 10 NR ?44 NR                   SNC    Nerve / Sites Rec. Site Peak Lat Ref.  Amp Ref. Segments Distance    ms ms V V  cm  R Sural - Ankle (Calf)     Calf Ankle 3.6 ?4.4 11 ?6 Calf - Ankle 14  L Sural - Ankle (Calf)     Calf Ankle 3.7 ?4.4 8 ?6 Calf - Ankle 14  R Superficial peroneal - Ankle     Lat leg Ankle 3.2 ?4.4 9 ?6 Lat leg - Ankle 14  L Superficial peroneal - Ankle     Lat leg Ankle 4.0 ?4.4 8 ?6 Lat leg - Ankle 14             F  Wave    Nerve F Lat Ref.   ms ms  R Tibial - AH 49.9 ?56.0  L Tibial - AH 49.0 ?56.0         EMG Summary Table    Spontaneous MUAP Recruitment  Muscle IA Fib PSW Fasc Other Amp Dur. Poly Pattern  L.  Vastus medialis Normal None None None _______ Normal Normal Normal Normal  L. Peroneus longus Normal None None None _______ Normal Normal Normal Normal  L. Tibialis anterior Normal None None None _______ Normal Normal Normal Normal  L. Gastrocnemius (Medial head) Normal None None None _______ Normal Normal Normal Normal  L. Extensor hallucis longus Normal None None None _______ Normal Normal Normal Normal  L. Lumbar paraspinals (low) Normal None None None _______ Normal Normal Normal Normal  L. Gluteus maximus Normal None None None _______ Normal Normal Normal Normal  L. Gluteus medius Normal None None None _______ Normal Normal Normal Normal  L. Biceps femoris (long head) Normal None None None _______ Normal Normal Normal Normal

## 2021-09-18 NOTE — Progress Notes (Signed)
Full Name: Ernest Campbell Gender: Male MRN #: [id number] Date of Birth: Dec 20, 1950    Visit Date: 09/14/2021 09:47 Age: 71 Years Examining Physician: Naomie Dean, MD  Referring Physician: Melvyn Novas, MD  History: Decline in gait stability, lower back pain, here with his wife and stating he is much improved with Dr. Oliva Bustard treatment plan.   Summary: Nerve conduction studies were performed on the bilateral lower extremities.  The right peroneal motor nerve (recording from the EDB) showed reduced amplitude (1.7 mV, normal greater than 2).  The left peroneal motor nerve (recording from the EDB) showed reduced amplitude (1.1 mV, normal greater than 2).  The left peroneal motor nerve (recording from the tibialis anterior) showed a conduction block across the fibular head. All remaining nerves (as indicated in the following tables) were within normal limits.  All muscles (as indicated in the following tables) were within normal limits.     Conclusion: There is reduced amplitude of the bilateral EDB peroneal motor nerves along with normal sensory conductions suggestive of bilateral L5/S1 radiculopathy. 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 EMG needle study was however normal which could indicate that these are remote findings. MRI of the lumbar spine could be considered however patient is feeling extremely better.  Please see note dated same day with my feedback to patient.   ------------------------------- Naomie Dean M.D.  [facility] [address], [address] [place], [place] [address] Tel: [phone] Fax: [phone]  Verbal informed consent was obtained from the patient, patient was informed of potential risk of procedure, including bruising, bleeding, hematoma formation, infection, muscle  weakness, muscle pain, numbness, among others.        MNC    Nerve / Sites Muscle Latency Ref. Amplitude Ref. Rel Amp Segments Distance Velocity Ref. Area    ms ms mV mV %  cm m/s m/s mVms  R Peroneal - EDB     Ankle EDB 6.5 ?6.5 1.7 ?2.0 100 Ankle - EDB 9   6.3     Fib head EDB 12.9  1.6  91.5 Fib head - Ankle 28 44 ?44 7.2     Pop fossa EDB 15.1  1.3  85.6 Pop fossa - Fib head 10 44 ?44 6.3         Pop fossa - Ankle      L Peroneal - EDB     Ankle EDB 6.5 ?6.5 1.1 ?2.0 100 Ankle - EDB 9   5.3     Fib head EDB 12.6  1.0  95.3 Fib head - Ankle 27 44 ?44 4.8     Pop fossa EDB 14.9  0.8  83.2 Pop fossa - Fib head 10 44 ?44 3.9         Pop fossa - Ankle      R Tibial - AH     Ankle AH 3.2 ?5.8 14.1 ?4.0 100 Ankle - AH 9   37.8     Pop fossa AH 11.5  11.2  79.5 Pop fossa - Ankle 38 45 ?41 34.3  L Tibial - AH     Ankle AH 3.3 ?5.8 8.8 ?4.0 100 Ankle - AH 9   27.9     Pop fossa AH 12.3  6.6  74.3 Pop fossa - Ankle 39 44 ?41 24.7  R Peroneal - Tib Ant  Fib Head Tib Ant 2.6 ?4.7 4.6 ?3.0 100 Fib Head - Tib Ant 10   27.0  °   Pop fossa Tib Ant 4.7  5.1  111 Pop fossa - Fib Head 10 48 ?44 30.2  °L Peroneal - Tib Ant  °   Fib Head Tib Ant 2.3 ?4.7 5.4 ?3.0 100 Fib Head - Tib Ant 10   44.9  °   Pop fossa Tib Ant NR  NR  NR Pop fossa - Fib Head 10 NR ?44 NR  °               ° ° °SNC °   °Nerve / Sites Rec. Site Peak Lat Ref.  Amp Ref. Segments Distance  °  ms ms µV µV  cm  °R Sural - Ankle (Calf)  °   Calf Ankle 3.6 ?4.4 11 ?6 Calf - Ankle 14  °L Sural - Ankle (Calf)  °   Calf Ankle 3.7 ?4.4 8 ?6 Calf - Ankle 14  °R Superficial peroneal - Ankle  °   Lat leg Ankle 3.2 ?4.4 9 ?6 Lat leg - Ankle 14  °L Superficial peroneal - Ankle  °   Lat leg Ankle 4.0 ?4.4 8 ?6 Lat leg - Ankle 14  °           °F  Wave °   °Nerve F Lat Ref.  ° ms ms  °R Tibial - AH 49.9 ?56.0  °L Tibial - AH 49.0 ?56.0  °       °EMG Summary Table   ° Spontaneous MUAP Recruitment  °Muscle IA Fib PSW Fasc Other Amp Dur. Poly Pattern  °L.  Vastus medialis Normal None None None _______ Normal Normal Normal Normal  °L. Peroneus longus Normal None None None _______ Normal Normal Normal Normal  °L. Tibialis anterior Normal None None None _______ Normal Normal Normal Normal  °L. Gastrocnemius (Medial head) Normal None None None _______ Normal Normal Normal Normal  °L. Extensor hallucis longus Normal None None None _______ Normal Normal Normal Normal  °L. Lumbar paraspinals (low) Normal None None None _______ Normal Normal Normal Normal  °L. Gluteus maximus Normal None None None _______ Normal Normal Normal Normal  °L. Gluteus medius Normal None None None _______ Normal Normal Normal Normal  °L. Biceps femoris (long head) Normal None None None _______ Normal Normal Normal Normal  ° °  °

## 2021-09-21 NOTE — Progress Notes (Signed)
Dr Jaynee Eagles: There is reduced amplitude of the bilateral EDB peroneal motor nerves along with normal sensory conductions suggestive of bilateral L5/S1 radiculopathy.  There is also a conduction block across the left fibular head indicating possible left peroneal neuropathy;  L5 radiculopathy with peroneal neuropathy could indicate a left lower extremity double crush syndrome.  EMG needle study was however normal which could indicate that these are remote findings.  MRI of the lumbar spine could be considered however patient is feeling extremely better. Patient will continue with exercises.   I will hold of on imaging study.  Larey Seat, MD

## 2021-09-21 NOTE — Progress Notes (Signed)
There is reduced amplitude of the bilateral EDB peroneal motor nerves along with normal sensory conductions suggestive of bilateral L5/S1 radiculopathy. 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 EMG needle study was however normal which could indicate that these are remote findings. MRI of the lumbar spine could be considered however patient is feeling extremely better. I will hold off on imaging study for now, the patient is only 6 weeks out from L4-5 TFESI at [facility] and can follow there. He has all spine imaging at [facility]-   He is on sinemet 25-100 tid.  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 Endplate degenerative changes at L4-5 - with loss of disc volume and sacralization.

## 2021-10-30 ENCOUNTER — Encounter: Payer: Self-pay | Admitting: Neurology

## 2021-10-30 ENCOUNTER — Other Ambulatory Visit: Payer: Self-pay | Admitting: Neurology

## 2021-10-30 NOTE — Telephone Encounter (Signed)
Called pt twice to schedule an appt for him but kept on being disconnected.  ?

## 2022-02-01 ENCOUNTER — Ambulatory Visit (INDEPENDENT_AMBULATORY_CARE_PROVIDER_SITE_OTHER): Payer: Medicare Other | Admitting: Neurology

## 2022-02-01 ENCOUNTER — Encounter: Payer: Self-pay | Admitting: Neurology

## 2022-02-01 VITALS — BP 124/61 | HR 63 | Ht 66.0 in | Wt 142.4 lb

## 2022-02-01 DIAGNOSIS — Z87898 Personal history of other specified conditions: Secondary | ICD-10-CM

## 2022-02-01 DIAGNOSIS — R2689 Other abnormalities of gait and mobility: Secondary | ICD-10-CM

## 2022-02-01 MED ORDER — CARBIDOPA-LEVODOPA 25-100 MG PO TABS: 1.0000 | ORAL_TABLET | Freq: Three times a day (TID) | ORAL | 3 refills | Status: DC

## 2022-02-01 NOTE — Patient Instructions (Signed)
Management of Memory Problems  There are some general things you can do to help manage your memory problems.  Your memory may not in fact recover, but by using techniques and strategies you will be able to manage your memory difficulties better.  1)  Establish a routine. Try to establish and then stick to a regular routine.  By doing this, you will get used to what to expect and you will reduce the need to rely on your memory.  Also, try to do things at the same time of day, such as taking your medication or checking your calendar first thing in the morning. Think about think that you can do as a part of a regular routine and make a list.  Then enter them into a daily planner to remind you.  This will help you establish a routine.  2)  Organize your environment. Organize your environment so that it is uncluttered.  Decrease visual stimulation.  Place everyday items such as keys or cell phone in the same place every day (ie.  Basket next to front door) Use post it notes with a brief message to yourself (ie. Turn off light, lock the door) Use labels to indicate where things go (ie. Which cupboards are for food, dishes, etc.) Keep a notepad and pen by the telephone to take messages  3)  Memory Aids A diary or journal/notebook/daily planner Making a list (shopping list, chore list, to do list that needs to be done) Using an alarm as a reminder (kitchen timer or cell phone alarm) Using cell phone to store information (Notes, Calendar, Reminders) Calendar/White board placed in a prominent position Post-it notes  In order for memory aids to be useful, you need to have good habits.  It's no good remembering to make a note in your journal if you don't remember to look in it.  Try setting aside a certain time of day to look in journal.  4)  Improving mood and managing fatigue. There may be other factors that contribute to memory difficulties.  Factors, such as anxiety, depression and tiredness can  affect memory. Regular gentle exercise can help improve your mood and give you more energy. Simple relaxation techniques may help relieve symptoms of anxiety Try to get back to completing activities or hobbies you enjoyed doing in the past. Learn to pace yourself through activities to decrease fatigue. Find out about some local support groups where you can share experiences with others. Try and achieve 7-8 hours of sleep at night.Memory Compensation Strategies  Use "WARM" strategy.  W= write it down  A= associate it  R= repeat it  M= make a mental note  2.   You can keep a Social worker.  Use a 3-ring notebook with sections for the following: calendar, important names and phone numbers,  medications, doctors' names/phone numbers, lists/reminders, and a section to journal what you did  each day.   3.    Use a calendar to write appointments down.  4.    Write yourself a schedule for the day.  This can be placed on the calendar or in a separate section of the Memory Notebook.  Keeping a  regular schedule can help memory.  5.    Use medication organizer with sections for each day or morning/evening pills.  You may need help loading it  6.    Keep a basket, or pegboard by the door.  Place items that you need to take out with you in the basket  or on the pegboard.  You may also want to  include a message board for reminders.  7.    Use sticky notes.  Place sticky notes with reminders in a place where the task is performed.  For example: " turn off the  stove" placed by the stove, "lock the door" placed on the door at eye level, " take your medications" on  the bathroom mirror or by the place where you normally take your medications.  8.    Use alarms/timers.  Use while cooking to remind yourself to check on food or as a reminder to take your medicine, or as a  reminder to make a call, or as a reminder to perform another task, etc.    Carbidopa; Levodopa Tablets What is this  medication? CARBIDOPA; LEVODOPA (kar bi DOE pa; lee voe DOE pa) treats the symptoms of Parkinson disease. It works by increasing the amount of dopamine in your brain, a substance which helps manage body movements and coordination. This reduces the symptoms of Parkinson, such as body stiffness and tremors. This medicine may be used for other purposes; ask your health care provider or pharmacist if you have questions. COMMON BRAND NAME(S): Atamet, Dhivy, SINEMET What should I tell my care team before I take this medication? They need to know if you have any of these conditions: Depression or other mental illness Diabetes Glaucoma Heart disease, including history of a heart attack History of irregular heartbeat Kidney disease Liver disease Lung or breathing disease, like asthma Narcolepsy Sleep apnea Stomach or intestine problems An unusual or allergic reaction to levodopa, carbidopa, other medications, foods, dyes, or preservatives Pregnant or trying to get pregnant Breast-feeding How should I use this medication? Take this medication by mouth with a glass of water. Follow the directions on the prescription label. Take your doses at regular intervals. Do not take your medication more often than directed. Do not stop taking except on the advice of your care team. One form of this medication is a breakable tablet. If your care team has prescribed this, this medication comes with INSTRUCTIONS FOR USE. Ask your pharmacist for directions on how to use this medicine. Read the information carefully. Talk to your pharmacist or care team if you have questions. Talk to your care team about the use of this medication in children. Special care may be needed. Overdosage: If you think you have taken too much of this medicine contact a poison control center or emergency room at once. NOTE: This medicine is only for you. Do not share this medicine with others. What if I miss a dose? If you miss a dose, take  it as soon as you can. If it is almost time for your next dose, take only that dose. Do not take double or extra doses. What may interact with this medication? Do not take this medication with any of the following: MAOIs like Marplan, Nardil, and Parnate Reserpine Tetrabenazine This medication may also interact with the following: Alcohol Droperidol Entacapone Iron supplements or multivitamins with iron Isoniazid, INH Linezolid Medications for depression, anxiety, or psychotic disturbances Medications for high blood pressure Medications for sleep Metoclopramide Papaverine Procarbazine Tedizolid Rasagiline Selegiline Tolcapone This list may not describe all possible interactions. Give your health care provider a list of all the medicines, herbs, non-prescription drugs, or dietary supplements you use. Also tell them if you smoke, drink alcohol, or use illegal drugs. Some items may interact with your medicine. What should I watch for while using  this medication? Visit your care team for regular checks on your progress. Tell your care team if your symptoms do not start to get better or if they get worse. Do not stop taking except on your care team's advice. You may develop a severe reaction. Your care team will tell you how much medication to take. You may experience a wearing of effect prior to the time for your next dose of this medication. You may also experience an on-off effect where the medication apparently stops working for anything from a minute to several hours, then suddenly starts working again. Tell your care team if any of these symptoms happen to you. Your dose may need to be changed. A high protein diet can slow or prevent absorption of this medication. Avoid high protein foods near the time of taking this medication to help to prevent these problems. Take this medication at least 30 minutes before eating or one hour after meals. You may want to eat higher protein foods later  in the day or in small amounts. Discuss your diet with your care team. You may get drowsy or dizzy. Do not drive, use machinery, or do anything that needs mental alertness until you know how this medication affects you. Do not stand or sit up quickly, especially if you are an older patient. This reduces the risk of dizzy or fainting spells. Alcohol may interfere with the effect of this medication. Avoid alcoholic drinks. When taking this medication, you may fall asleep without notice. You may be doing activities like driving a car, talking, or eating. You may not feel drowsy before it happens. Contact your care team right away if this happens to you. There have been reports of increased sexual urges or other strong urges such as gambling while taking this medication. If you experience any of these while taking this medication, you should report this to your care team as soon as possible. If you are diabetic, this medication may interfere with the accuracy of some tests for sugar or ketones in the urine (does not interfere with blood tests). Check with your care team before changing the dose of your diabetic medication. This medication may discolor the urine or sweat, making it look darker or red in color. This is of no cause for concern. However, this may stain clothing or fabrics. This medication may cause a decrease in vitamin B6. You should make sure that you get enough vitamin B6 while you are taking this medication. Discuss the foods you eat and the vitamins you take with your care team. What side effects may I notice from receiving this medication? Side effects that you should report to your care team as soon as possible: Allergic reactions--skin rash, itching, hives, swelling of the face, lips, tongue, or throat Falling asleep during daily activities Heart rhythm changes--fast or irregular heartbeat, dizziness, feeling faint or lightheaded, chest pain, trouble breathing Low blood  pressure--dizziness, feeling faint or lightheaded, blurry vision Mood and behavior changes--anxiety, nervousness, confusion, hallucinations, irritability, hostility, thoughts of suicide or self-harm, worsening mood, feelings of depression New or worsening uncontrolled and repetitive movements of the face, mouth, or upper body Stomach bleeding--bloody or black, tar-like stools, vomiting blood or brown material that looks like coffee grounds Sudden eye pain or change in vision such as blurry vision, seeing halos around lights, vision loss Urges to engage in impulsive behaviors such as gambling, binge eating, sexual activity, or shopping in ways that are unusual for you Side effects that usually do not  require medical attention (report to your care team if they continue or are bothersome): Dark red or black saliva, sweat, or urine Dizziness Drowsiness Headache Nausea This list may not describe all possible side effects. Call your doctor for medical advice about side effects. You may report side effects to FDA at 1-800-FDA-1088. Where should I keep my medication? Keep out of the reach of children. Store at room temperature between 15 and 30 degrees C (59 and 86 degrees F). Protect from light. Throw away any unused medication after the expiration date. NOTE: This sheet is a summary. It may not cover all possible information. If you have questions about this medicine, talk to your doctor, pharmacist, or health care provider.  2023 Elsevier/Gold Standard (2021-02-13 00:00:00)

## 2022-02-01 NOTE — Progress Notes (Signed)
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  After dr Lucia Gaskins reevaluated his  foot drop and back pain: bilateral EDB peroneal motor nerves along with normal sensory conductions suggestive of bilateral L5/S1 radiculopathy. 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 EMG needle study was however normal which could indicate that these are remote findings. He is now receiving epidural injection through [facility] [employer], Dr.O'tuel ,MD.  Here reporting improvement , better strength but not yet stamina. He had almost not walked at all for 4 months when initially seen here. Has been shuffling less, memory improved as well ( subjective). He takes sinemet 25/ 100 mg 90 days for tid use and that has made the most impact.            Chief concern according to patient's wife: The patient fell from a kitchen counter on which she worked backwards and hit his head so this was a quite significant traumatic impact also initially neither his personality nor his abilities for change.  Within 3 wee  ks or so the spells noted a decline in gait stability, some more forgetfulness and memory lapses and also a change in facial expression.  He was not himself.  The patient presentedv to his  orthopedic surgeon Dr. Althea Charon.  From there he was referred for an MRI of the brain but he could not tolerate lying flat in the loud machine and therefore had to do it under sedation at the hospital.  At the hospital there was also an MRI of the spine all 3 compartments were reviewed cervical, lumbar and thoracic.  He was treated with steroids also I am not sure what the treated disease was.  He did have some lower back pain. 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  Rather mild canal stenosis.  Based on these findings and May 18, 2021 the patient had been started on steroids.  He was started I on steroids and became delirious. S he wouldn't sleep and would crawl on the floor, was not responding to verbal stimuli.     BRYLON BRENNING has no medical HISTORY abstracted in EPIC.     The patent is a smoker, he hasn't been drinking alcohol  for 2 decades.  Retired from  [employer], since 2016.at age 72, worked outdoors a lot. Caffeine - 2 coffees a day, in AM.   Family medical /sleep history: no  other family member on CPAP with OSA, insomnia, sleep walkers.    Social history:  Patient is lives in a household with spouse, childless,  married  for 40 years.  Pets; one cat. Tobacco use yes.  ETOH use moderate not in years.        Review of Systems: Out of a complete 14 system review, the patient complains of only the following symptoms, and all other reviewed systems are negative.:   Memory, falls and urinary incontinence. NPH post fall ?    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  Dentures.  Masked face.  Edentulous.  Laquered tongue.    Falls. Shuffling. NPH ? PD?  Bedwetting  Misplacing things, trouble folding clothes. MCI  Social History   Socioeconomic History   Marital status:  Married    Spouse name: Not on file   Number of children: Not on file   Years of education: Not on file   Highest education level: Not on file  Occupational History   Not on file  Tobacco Use   Smoking status: Every Day    Packs/day: 0.25    Types: Cigarettes   Smokeless tobacco: Not on file  Substance and Sexual Activity   Alcohol use: Never   Drug use: Never   Sexual activity: Not on file  Other Topics Concern   Not on file  Social History Narrative   Not on file   Social Determinants of Health   Financial Resource Strain: Not on file  Food Insecurity: Not on file  Transportation Needs: Not on file  Physical Activity: Not on file  Stress: Not on file  Social Connections: Not on file    Family History  Problem Relation Age of Onset   Dementia Mother    Parkinsonism Father    Breast cancer Sister     No past medical history on file.  No past surgical history on file.   Current Outpatient Medications on File Prior to Visit  Medication Sig Dispense Refill   carbidopa-levodopa (SINEMET IR) 25-100 MG tablet TAKE 1 TABLET BY MOUTH THREE TIMES A DAY 270 tablet 0   ibuprofen (ADVIL) 200 MG tablet Take 200 mg by mouth every 6 (six) hours as needed.     No current facility-administered medications on file prior to visit.    Allergies  Allergen Reactions   Prednisone Other (See Comments)    Physical exam:  Today's Vitals   02/01/22 1145  BP: 124/61  Pulse: 63  Weight: 142 lb 6.4 oz (64.6 kg)  Height: 5\' 6"  (1.676 m)   Body mass index is 22.98 kg/m.   Wt Readings from Last 3 Encounters:  02/01/22 142 lb 6.4 oz (64.6 kg)  07/13/21 142 lb (64.4 kg)  07/03/21 141 lb (64 kg)     Ht Readings from Last 3 Encounters:  02/01/22 5\' 6"  (1.676 m)  07/13/21 5\' 6"  (1.676 m)  07/03/21 5\' 5"  (1.651 m)      General: The patient is awake, alert and appears not in acute distress. The patient is well groomed. Head: Normocephalic, atraumatic. Neck is supple.    Mallampati 2,  neck circumference:15 inches . Nasal airflow barely patent.  Retrognathia is  seen.  Dental status: .   Cardiovascular:  Regular rate and cardiac rhythm by pulse,  without distended neck veins. Respiratory: Lungs are clear to auscultation.  Skin:  Without evidence of ankle edema, or rash. Trunk: The patient's posture is erect.   Neurologic exam : The patient is awake and alert, oriented to place and time.   Memory subjective described as  impaired. It has improved- he  was able to spell WORLD backwards but not finish serial 7th.        02/01/2022   11:53 AM 07/03/2021    2:26 PM  MMSE - Mini Mental State Exam  Orientation to time 3 5  Orientation to Place 5 4  Registration 3 3  Attention/ Calculation 1 2  Recall 2 1  Language- name 2 objects 2 2  Language- repeat 1 1  Language- follow 3 step command 3 3  Language- read & follow direction 1 1  Write a sentence 1 1  Copy design 0 0  Total score 22 23    Attention span & concentration ability were limited, losing train of thought   Speech with dysphonia , and possibly aphasia. Sparse output.  Mood and affect are aloof .   Cranial nerves: no loss of smell or taste reported  Pupils are unequal , left 2 mm, right 3 mm, and briskly reactive to light. Funduscopic exam deferred.  Extraocular movements in vertical and horizontal planes were coatsely saccadic not smooth intact and without nystagmus. No Diplopia. Visual fields by finger perimetry are intact. Hearing was intact to soft voice and finger rubbing.   Facial sensation intact to fine touch. Facial motor strength is symmetric and tongue and uvula move midline.  Neck ROM : rotation, tilt and flexion extension were normal for age and shoulder shrug was symmetrical.    Motor exam:  Symmetric bulk, tone and ROM.   Shuffling gait, stooped, turning with 4 steps. Diffuse increased tone without clear cog-wheeling, symmetrically reduced grip strength.    Sensory:  Fine touch, and vibration were reduced more in the left ankle and patella than on the right.  Numbness between hallux and second toe left side.   Proprioception tested in the upper extremities was normal.   Coordination: Rapid alternating movements in the fingers/hands were of reduced speed.  The Finger-to-nose maneuver was intact without evidence of ataxia, dysmetria or tremor.   Gait and station: Patient could rise assisted from a seated position, he stood up but couldn't straight himself, stooped posture, delayed initiation of movement,   walked without assistive device. He is shuffling , has minimal  arm-swing  He turned with 4 steps and no longer  had to hold on to the left wall- ,  and no visible tremor. Stance is stooped. Toe and heel walk were deferred.  Deep tendon reflexes: in the upper and lower extremities are symmetric - trace only  Babinski response was down going bilaterally.        After spending a total time of 40 minutes face to face and additional time for physical and neurologic examination, review of laboratory studies, EMG, NCV, Images and medication, personal review of imaging studies, reports and results of other testing and review of referral information / records as far as provided in visit, I have established the following assessments: 07/17/2021: MRI brain: "Motion degraded examination, as described and limiting evaluation.  No evidence of acute intracranial abnormality.  Moderate chronic small vessel ischemic changes within the cerebral  white matter, not appreciably changed from the brain MRI of 05/18/2021.   Severe right maxillary sinusitis."   LP/CSF: "Mildly elevated protein, normal glucose in CSF and only 3 cells- normal   (not NPH by low opening pressure of 10 cm water )" "Normal opening pressure, 28 ml were removed CSF testing is documented separately." "Slight elevation of protein in CSF , no cells- need RV for rule out of GBS or  CIDP"    not NPH.   Improved movement fluency on Sinemet and with treatment of the L5/ S1 pain.   1) No longer has visual hallucinations. ( very likely Lewy body disease by description, with an appearance of atypical parkinsonism, but this is improved ) .  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 He began having urinary incontinence, now he has no more accidents for the last 4 months. He is also no longer hypersomnolent- used to fall asleep any time of the day, now stays awake until 10 PM. .  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 From 22/ 30 to  26/ 30. Educational level is 11th grade.    My Plan is to proceed with:    MRI was motion degraded.  1)  Post traumatic movement changes have improved, steadier gait and rises unassisted out of a chair.  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  3)  He may tolerate Aricept, MMSE in hospital was 23 and 24 points, here 22 points and 26/30 points.    I would like to thank Buzzy Han, Md No address on file for allowing me to meet with and to take care of this pleasant patient.   In short, LINUX EICHER is presenting with the possible mentioned symptoms, associated with  posttraumatic  parkinsonism.    I plan to follow up either personally or through our NP within 12 month.  MMSE repeat, replace serial 7 with WORLD spelling. Consider aricept.   CC: I will share my notes with PCP.  Electronically signed by: Larey Seat, MD 02/01/2022 12:03 PM  [facility] Board certified by The [employer] of Sleep Medicine and Diplomate of the [employer] of Sleep Medicine. Board  certified In Neurology through the [place], Fellow of the [employer] of Neurology. Medical Director of [facility].

## 2022-12-04 IMAGING — MR MR HEAD W/O CM
7 of 11 series · 40 of 48 positions shown · non-contrast
Comparison: Brain MRI 05/18/2021.

CLINICAL DATA: History of short-term memory loss. Traumatic brain
injury, without loss of consciousness, sequela. Other secondary
parkinsonism. Vertical dissociated gaze palsy. Nocturnal enuresis.
Episodes of formed visual hallucinations. Shuffling gait. Dementia,
[place] body suspected; extrapyramidal or movement disorder; NPH versus
parkinsonism.

EXAM:
MRI HEAD WITHOUT CONTRAST
TECHNIQUE: Multiplanar, multiecho pulse sequences of the brain and surrounding
structures were obtained without intravenous contrast.

[Series 3: DWI · axial · 3.0mm · 1.09mm/px · z∈[-44,+118]mm · 11 of 110 slices shown (1 of 4)]
[im 1/110]
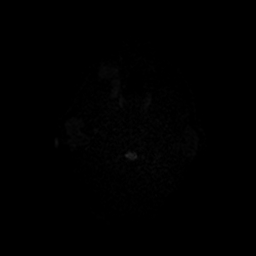
[im 11/110]
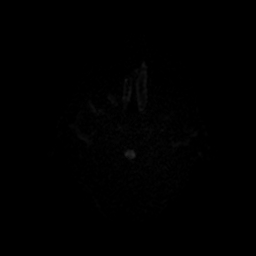
[im 22/110]
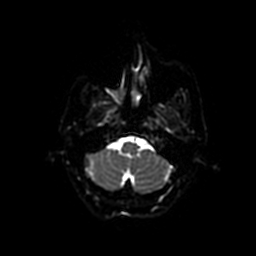
[im 33/110]
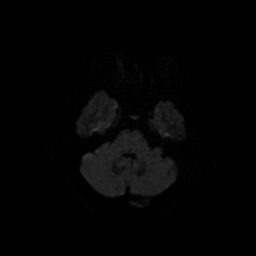
[im 44/110]
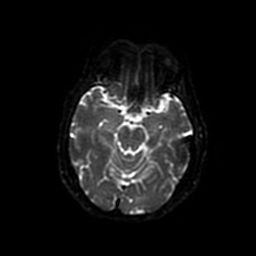
[im 55/110]
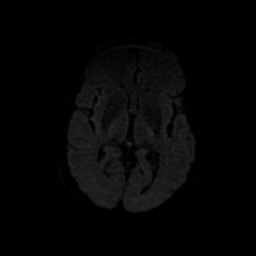
[im 66/110]
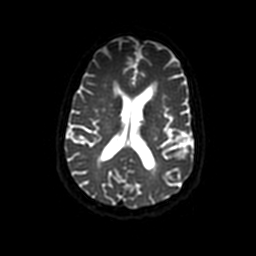
[im 77/110]
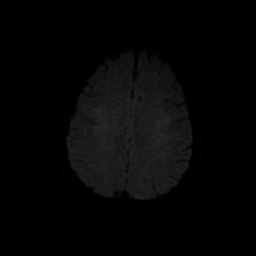
[im 88/110]
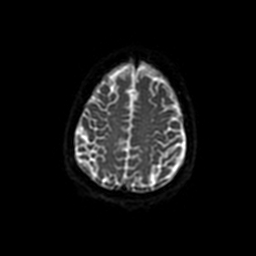
[im 99/110]
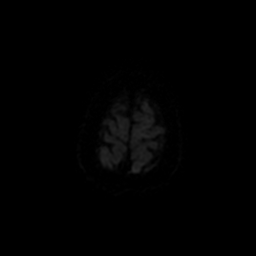
[im 110/110]
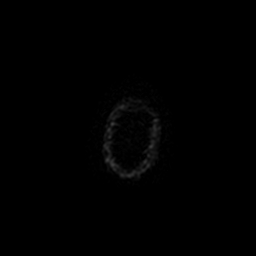

[Series 7: DWI · coronal · 5.0mm · 1.09mm/px · 9 of 88 slices shown (2 of 4)]
[im 1/88]
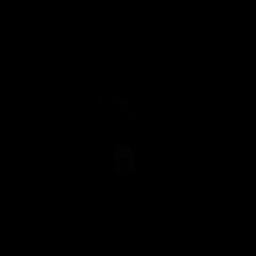
[im 11/88]
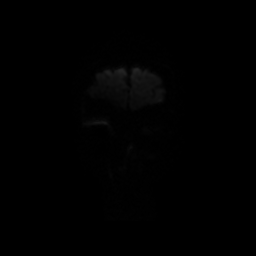
[im 22/88]
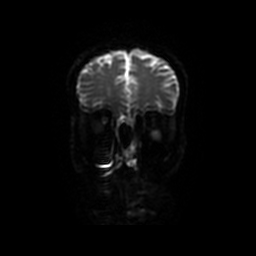
[im 33/88]
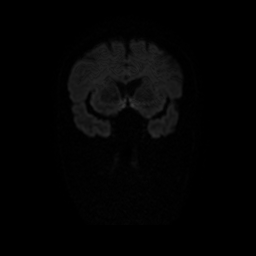
[im 44/88]
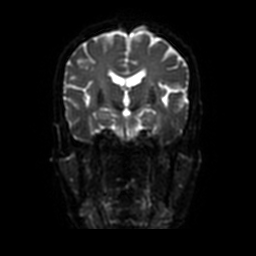
[im 55/88]
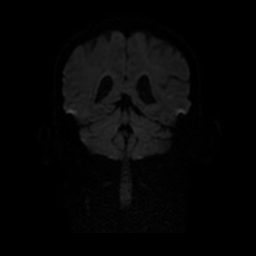
[im 66/88]
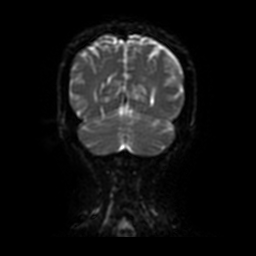
[im 77/88]
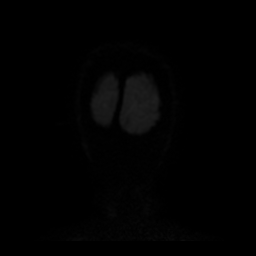
[im 88/88]
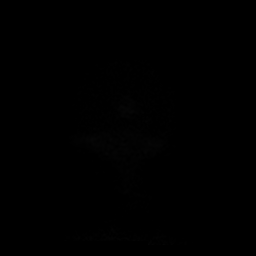

[Series 8: T2 · axial · 5.0mm · 0.45mm/px · z∈[-37,+107]mm · 3 of 25 slices shown (1 of 2)]
[im 1/25]
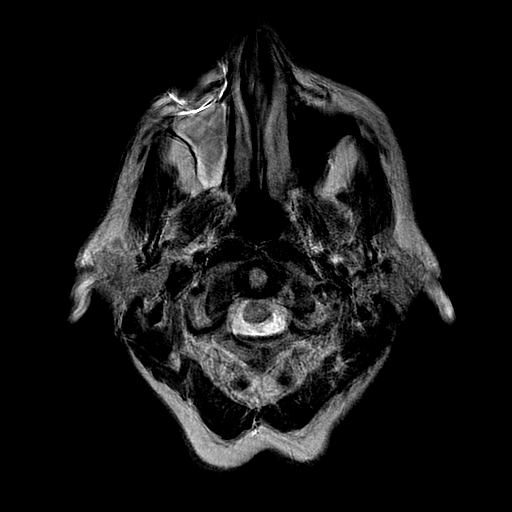
[im 13/25]
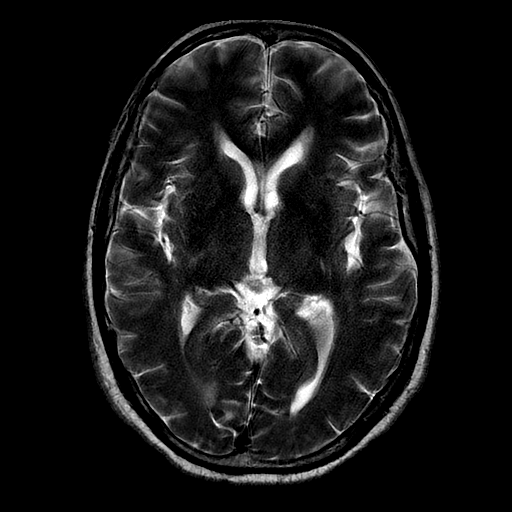
[im 25/25]
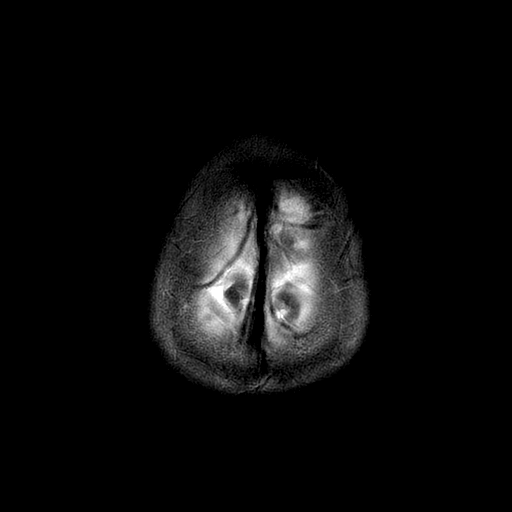

[Series 9: FLAIR · axial · 3.0mm · 0.45mm/px · z∈[-37,+107]mm · 3 of 25 slices shown]
[im 1/25]
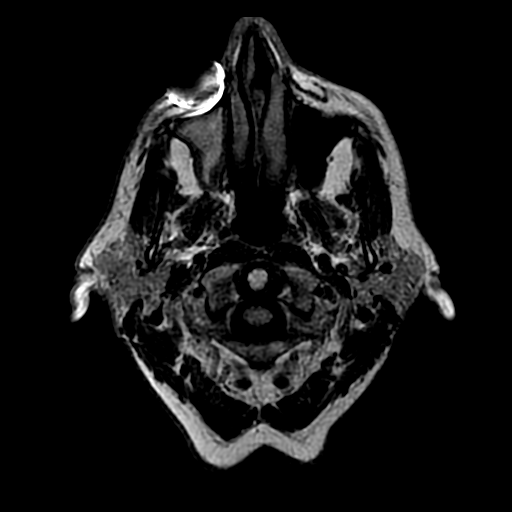
[im 13/25]
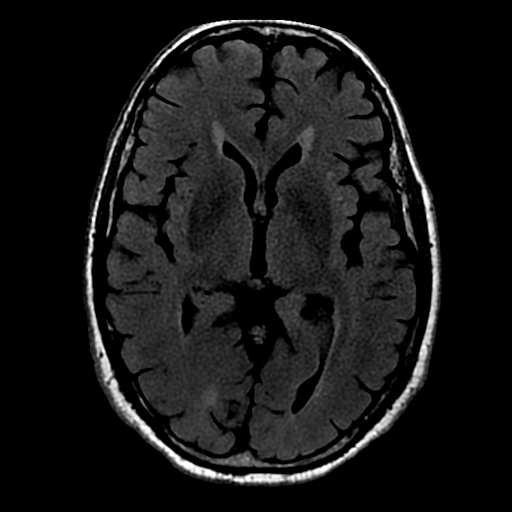
[im 25/25]
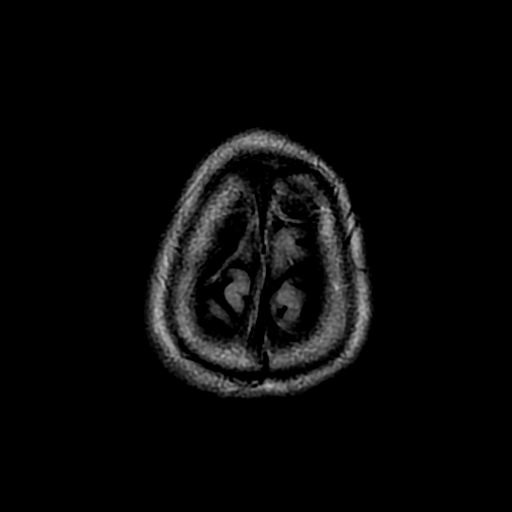

[Series 12: T2 · coronal · 5.0mm · 0.39mm/px · 3 of 24 slices shown (2 of 2)]
[im 1/24]
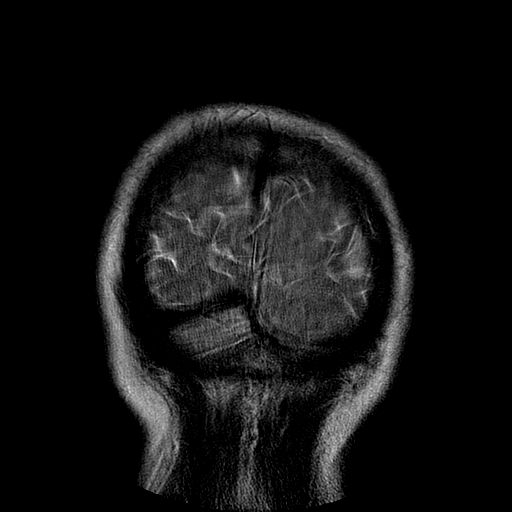
[im 12/24]
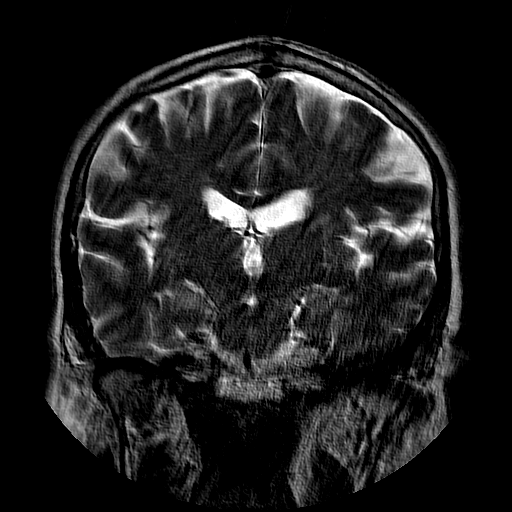
[im 24/24]
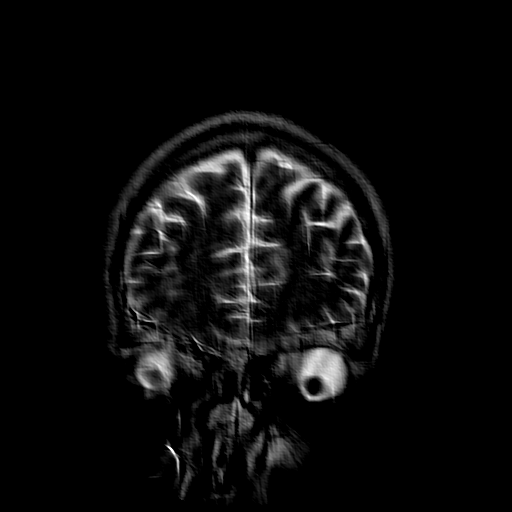

[Series 300: DWI · axial · 3.0mm · 1.09mm/px · z∈[-44,+118]mm · 6 of 55 slices shown (3 of 4)]
[im 1/55]
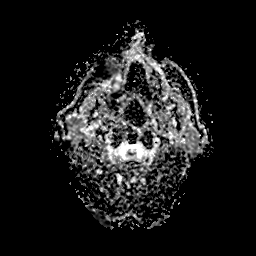
[im 11/55]
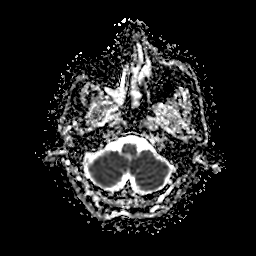
[im 22/55]
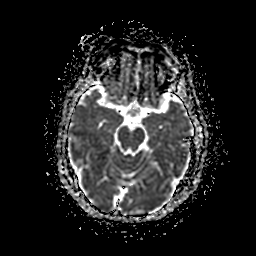
[im 33/55]
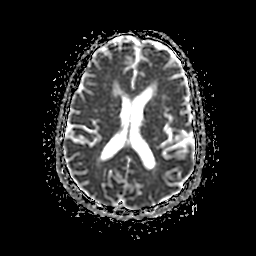
[im 44/55]
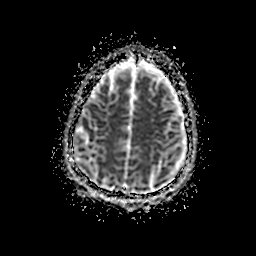
[im 55/55]
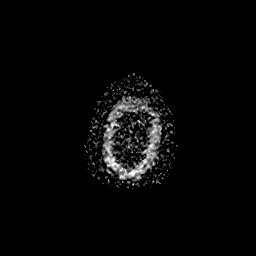

[Series 700: DWI · coronal · 5.0mm · 1.09mm/px · 5 of 43 slices shown (4 of 4)]
[im 1/43]
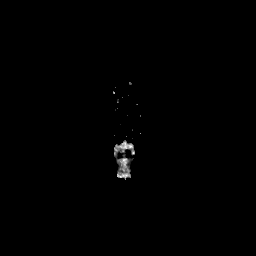
[im 11/43]
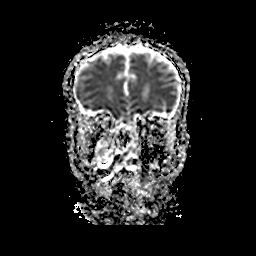
[im 22/43]
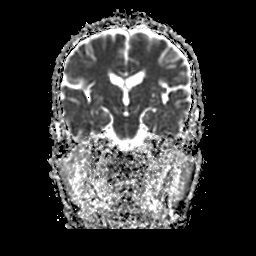
[im 32/43]
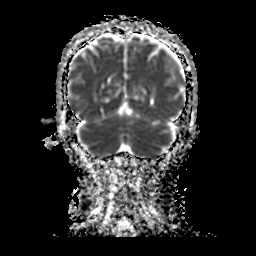
[im 43/43]
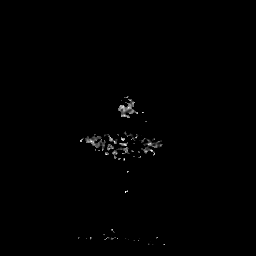

[40 of 48 positions shown; findings below may reference images not displayed]

FINDINGS: Brain:

Intermittently motion degraded examination, limiting evaluation.
Most notably, there is moderate motion degradation of the axial T2
TSE sequence, moderate to moderately severe motion degradation of
the axial T2 FLAIR sequence, moderate to moderately severe motion
degradation of the axial T1 weighted sequence and severe motion
degradation of the coronal T2 TSE sequence.

Cerebral volume appears normal for age.

Moderate multifocal T2 FLAIR hyperintense signal abnormality within
the cerebral white matter, nonspecific but compatible with chronic
small vessel ischemic disease. These findings have not appreciably
changed from the brain MRI of 05/18/2021.

Partially empty sella turcica, a nonspecific finding.

There is no acute infarct. Within the limitations of motion
degradation, no intracranial mass, chronic intracranial blood
products or extra-axial fluid collection is identified. No midline
shift.

Vascular: Maintained flow voids within the proximal large arterial
vessels.

Skull and upper cervical spine: No focal suspicious marrow lesion.

Sinuses/Orbits: Visualized orbits show no acute finding. Complete
opacification of the right maxillary sinus at the imaged levels,
unchanged.
IMPRESSION: Motion degraded examination, as described and limiting evaluation.

No evidence of acute intracranial abnormality.

Moderate chronic small vessel ischemic changes within the cerebral
white matter, not appreciably changed from the brain MRI of
05/18/2021.

Severe right maxillary sinusitis.

## 2023-01-31 ENCOUNTER — Ambulatory Visit (INDEPENDENT_AMBULATORY_CARE_PROVIDER_SITE_OTHER): Payer: Medicare Other | Admitting: Neurology

## 2023-01-31 ENCOUNTER — Encounter: Payer: Self-pay | Admitting: Neurology

## 2023-01-31 VITALS — BP 128/68 | HR 69 | Ht 67.0 in | Wt 140.5 lb

## 2023-01-31 DIAGNOSIS — S069X0S Unspecified intracranial injury without loss of consciousness, sequela: Secondary | ICD-10-CM | POA: Diagnosis not present

## 2023-01-31 DIAGNOSIS — R2689 Other abnormalities of gait and mobility: Secondary | ICD-10-CM

## 2023-01-31 DIAGNOSIS — G218 Other secondary parkinsonism: Secondary | ICD-10-CM | POA: Diagnosis not present

## 2023-01-31 MED ORDER — DONEPEZIL HCL 5 MG PO TABS: ORAL_TABLET | ORAL | 5 refills | Status: DC

## 2023-01-31 MED ORDER — DONEPEZIL HCL 10 MG PO TABS: ORAL_TABLET | ORAL | 5 refills | Status: DC

## 2023-01-31 MED ORDER — CARBIDOPA-LEVODOPA 25-100 MG PO TABS: 1.0000 | ORAL_TABLET | Freq: Three times a day (TID) | ORAL | 3 refills | Status: DC

## 2023-01-31 NOTE — Progress Notes (Signed)
Patient: Ernest Campbell Date of Birth: [birth year]  Reason for Visit: Follow up History from: Patient, wife  Primary Neurologist: Dohmeier   ASSESSMENT AND PLAN 72 y.o. year old male   1.  Posttraumatic parkinsonism  2.  Gait abnormality 3.  Memory impairment, MOCA 17/30  -Overall, feels nearly back to normal, and has had signifincat improvement in his gait/memory since Fall in 2022 -Start Aricept working up to 10 mg at bedtime -Continue the Sinemet 25/100 mg 3 times daily, still some bradykinesia, stiffness on the right side (more in the arm than leg) -Keeps close follow up with neurosurgery for back pain, gets ESI every 3 months -Follow up in 6 months or sooner if needed with Dr. Vickey Huger   Meds ordered this encounter  Medications   DISCONTD: donepezil (ARICEPT) 10 MG tablet    Sig: Start taking 1/2 tablet at bedtime for 1 month, then take 1 tablet    Dispense:  30 tablet    Refill:  5   carbidopa-levodopa (SINEMET IR) 25-100 MG tablet    Sig: Take 1 tablet by mouth 3 (three) times daily.    Dispense:  270 tablet    Refill:  3   donepezil (ARICEPT) 5 MG tablet    Sig: Start taking 1 tablet at bedtime for 1 month, then take 2 tablets at bedtime    Dispense:  60 tablet    Refill:  5    Please cancel Aricept 10 mg, he has arthritis in his hands, hard to cut pills   HISTORY OF PRESENT ILLNESS: Today 01/31/23 Last saw Dr. Vickey Huger in June 2023.  He sustained a backwards fall hitting his head in 2022 which was felt to have initiated his troubles.  Visual hallucinations had resolved.  Responding well to Sinemet 25/100 mg 3 times daily.  His MMSE was 22/30 then 26/30 when calculations were swapped. Dr. Vickey Huger felt atypical parkinsonian after trauma.  He may tolerate Aricept.  Not felt to be NPH.  He is seeing [facility] neurosurgery for low back pain.  Planning for caudal ESI.  It is difficult for him to have MRI due to laying flat. MOCA 17/30 today. Still trouble with short term  memory. Feels his memory is improving to some degree. Wife credits removal of CSF from LP as improving his condition. He remains on Sinemet 25/100 3 times daily 8, 12, 6. They are not sure how much it has helped him. Wife mentions since the fall discovered his back issues, now gets every 3 months injections. When he gets tired his right leg will have a "skip" (tip toe walk) in it, felt to be back related. At his worst with memory, he didn't know his wife, thought she was a new woman. He is essentially back to normal with his cognition, in a lot of other ways he is a lot better. Wife recalls when Sinemet was started due to rigidity in his arms, no arm swing. Only 1-2 urinary accidents in the last year. Good daytime energy. He works in his yard, has a Probation officer.   HISTORY  02/01/22 Dr. Vickey Huger: Ernest Campbell is a 25 - year- old Caucasian male patient seen here in the presence of his spouse, upon consultation request by PCP,RV on  02/01/2022 ,  Dx with S 1/ L5 radiculopathy, with PD and MCI. He had a fall and developed TBI.  After dr Lucia Gaskins reevaluated his  foot drop and back pain: bilateral EDB peroneal motor nerves along with normal  sensory conductions suggestive of bilateral L5/S1 radiculopathy. 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 EMG needle study was however normal which could indicate that these are remote findings. He is now receiving epidural injection through [employer], Dr.O'tuel ,MD.   Here reporting improvement , better strength but not yet stamina. He had almost not walked at all for 4 months when initially seen here. Has been shuffling less, memory improved as well ( subjective). He takes sinemet 25/ 100 mg 90 days for tid use and that has made the most impact.    Chief concern according to patient's wife: The  patient fell from a kitchen counter on which she worked backwards and hit his head so this was a quite significant traumatic impact also initially neither his personality nor his abilities for change.  Within 3 wee   ks or so the spells noted a decline in gait stability, some more forgetfulness and memory lapses and also a change in facial expression.  He was not himself.  The patient presentedv to his orthopedic surgeon Dr. Althea Charon.  From there he was referred for an MRI of the brain but he could not tolerate lying flat in the loud machine and therefore had to do it under sedation at the hospital.  At the hospital there was also an MRI of the spine all 3 compartments were reviewed cervical, lumbar and thoracic.  He was treated with steroids also I am not sure what the treated disease was.  He did have some lower back pain. 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  Rather mild canal stenosis.  Based on these findings and May 18, 2021 the patient had been started on steroids.   He was started I on steroids and became delirious. S he wouldn't sleep and would crawl on the floor, was not responding to verbal stimuli.       [name] has no medical HISTORY abstracted in EPIC.     The patent is a smoker, he hasn't been drinking alcohol  for 2 decades.  Retired from  [employer] representative, since 2016.at age 60, worked outdoors a lot. Caffeine - 2 coffees a day, in AM.   Family medical /sleep history: no  other family member on CPAP with OSA, insomnia, sleep walkers.    Social history:  Patient is lives in a household with spouse, childless,  married for 40 years.  Pets; one cat. Tobacco use yes.  ETOH use moderate not in years.  REVIEW OF SYSTEMS: Out of a complete 14 system review of symptoms, the patient complains only of the following symptoms, and all other  reviewed systems are negative.  See HPI  ALLERGIES: Allergies  Allergen Reactions   Other     Other Reaction(s): Hallucinations  According to patient all opioids cause hallucinations   Prednisone Other (See Comments)   Acetaminophen    Codeine    Morphine    Oxycodone    Oxycodone Hcl     HOME MEDICATIONS: Outpatient Medications Prior to Visit  Medication Sig Dispense Refill   alfuzosin (UROXATRAL) 10 MG 24 hr tablet Take 10 mg by mouth daily with breakfast.     ibuprofen (ADVIL) 200 MG tablet Take 200 mg by mouth every 6 (six) hours as needed.  carbidopa-levodopa (SINEMET IR) 25-100 MG tablet Take 1 tablet by mouth 3 (three) times daily. 270 tablet 3   No facility-administered medications prior to visit.    PAST MEDICAL HISTORY: No past medical history on file.  PAST SURGICAL HISTORY: No past surgical history on file.  FAMILY HISTORY: Family History  Problem Relation Age of Onset   Dementia Mother    Parkinsonism Father    Breast cancer Sister     SOCIAL HISTORY: Social History   Socioeconomic History   Marital status: Married    Spouse name: Not on file   Number of children: Not on file   Years of education: Not on file   Highest education level: Not on file  Occupational History   Not on file  Tobacco Use   Smoking status: Every Day    Packs/day: .25    Types: Cigarettes   Smokeless tobacco: Not on file  Substance and Sexual Activity   Alcohol use: Never   Drug use: Never   Sexual activity: Not on file  Other Topics Concern   Not on file  Social History Narrative   Not on file   Social Determinants of Health   Financial Resource Strain: Not on file  Food Insecurity: Not on file  Transportation Needs: Not on file  Physical Activity: Not on file  Stress: Not on file  Social Connections: Not on file  Intimate Partner Violence: Not on file    PHYSICAL EXAM  Vitals:   01/31/23 0955  BP: 128/68  Pulse: 69  Weight: 140 lb 8 oz (63.7  kg)  Height: 5\' 7"  (1.702 m)   Body mass index is 22.01 kg/m.  Generalized: Well developed, in no acute distress, mild masking face Neurological examination  Mentation: Alert oriented to time, place, history taking. Mild trouble following exam commands, speech is slightly slowed, wife elaborates more Cranial nerve II-XII: Pupils were equal round reactive to light. Extraocular movements were full, visual field were full on confrontational test. Facial sensation and strength were normal. Head turning and shoulder shrug  were normal and symmetric. Motor: The motor testing reveals 5 over 5 strength of all 4 extremities. Good symmetric motor tone is noted throughout.  Sensory: Sensory testing is intact to soft touch on all 4 extremities. No evidence of extinction is noted.  Coordination: Cerebellar testing reveals good finger-nose-finger and heel-to-shin bilaterally.  Slight bradykinesia with uppers, slower.  Gait and station: Able to stand from seated position without push off, gait is normal stance, decreased arm swing on the right, slight limp on the right, caution with turns, appears mildly stiff on the right Reflexes: Deep tendon reflexes are symmetric and normal bilaterally.   DIAGNOSTIC DATA (LABS, IMAGING, TESTING) - I reviewed patient records, labs, notes, testing and imaging myself where available.  Lab Results  Component Value Date   WBC 9.0 04/27/2021   HGB 14.4 04/27/2021   HCT 40.6 04/27/2021   MCV 94.6 04/27/2021   PLT 309 04/27/2021      Component Value Date/Time   NA 138 07/03/2021 1539   K 4.8 07/03/2021 1539   CL 104 07/03/2021 1539   CO2 22 07/03/2021 1539   GLUCOSE 99 07/03/2021 1539   GLUCOSE 101 (H) 04/27/2021 1915   BUN 19 07/03/2021 1539   CREATININE 1.10 07/03/2021 1539   CALCIUM 9.4 07/03/2021 1539   PROT 6.4 07/03/2021 1539   ALBUMIN 4.3 07/03/2021 1539   AST 14 07/03/2021 1539   ALT 14 07/03/2021 1539  ALKPHOS 75 07/03/2021 1539   BILITOT 0.3  07/03/2021 1539   GFRNONAA >60 04/27/2021 1915   No results found for: "CHOL", "HDL", "LDLCALC", "LDLDIRECT", "TRIG", "CHOLHDL" No results found for: "HGBA1C" No results found for: "VITAMINB12" No results found for: "TSH"  Margie Ege, AGNP-C, DNP 01/31/2023, 10:36 AM [facility] [address], [address] [place], [place] [address] [phone]

## 2023-01-31 NOTE — Patient Instructions (Addendum)
Start Aricept working up to 10 mg at bedtime, start taking 5 mg at bedtime for 1 month then increase to 10 mg . Watch for GI upset, Continue Sinemet 25/100 mg 3 times daily. Recommend brain stimulating activity, recommend exercise. Follow up in 6 months

## 2023-04-18 ENCOUNTER — Other Ambulatory Visit: Payer: Self-pay | Admitting: Physician Assistant

## 2023-04-18 DIAGNOSIS — M5416 Radiculopathy, lumbar region: Secondary | ICD-10-CM

## 2023-04-27 ENCOUNTER — Ambulatory Visit
Admission: RE | Admit: 2023-04-27 | Discharge: 2023-04-27 | Disposition: A | Discharge status: Home / Self Care | Payer: Medicare Other | Source: Ambulatory Visit | Attending: Physician Assistant | Admitting: Physician Assistant

## 2023-04-27 DIAGNOSIS — M5416 Radiculopathy, lumbar region: Secondary | ICD-10-CM

## 2023-07-18 ENCOUNTER — Other Ambulatory Visit: Payer: Self-pay

## 2023-07-18 ENCOUNTER — Telehealth: Payer: Self-pay | Admitting: Neurology

## 2023-07-18 ENCOUNTER — Encounter (HOSPITAL_COMMUNITY): Payer: Self-pay

## 2023-07-18 ENCOUNTER — Inpatient Hospital Stay (HOSPITAL_COMMUNITY)
Admission: EM | Admit: 2023-07-18 | Discharge: 2023-07-21 | DRG: 064 | Disposition: A | Discharge status: Home / Self Care | Payer: Medicare Other | Attending: Internal Medicine | Admitting: Internal Medicine

## 2023-07-18 ENCOUNTER — Emergency Department (HOSPITAL_COMMUNITY): Payer: Medicare Other

## 2023-07-18 DIAGNOSIS — G20A1 Parkinson's disease without dyskinesia, without mention of fluctuations: Secondary | ICD-10-CM | POA: Diagnosis present

## 2023-07-18 DIAGNOSIS — F05 Delirium due to known physiological condition: Secondary | ICD-10-CM | POA: Diagnosis not present

## 2023-07-18 DIAGNOSIS — R451 Restlessness and agitation: Secondary | ICD-10-CM | POA: Diagnosis present

## 2023-07-18 DIAGNOSIS — T450X5A Adverse effect of antiallergic and antiemetic drugs, initial encounter: Secondary | ICD-10-CM | POA: Diagnosis present

## 2023-07-18 DIAGNOSIS — I10 Essential (primary) hypertension: Secondary | ICD-10-CM | POA: Diagnosis present

## 2023-07-18 DIAGNOSIS — H532 Diplopia: Secondary | ICD-10-CM | POA: Diagnosis present

## 2023-07-18 DIAGNOSIS — I639 Cerebral infarction, unspecified: Principal | ICD-10-CM | POA: Diagnosis present

## 2023-07-18 DIAGNOSIS — J320 Chronic maxillary sinusitis: Secondary | ICD-10-CM | POA: Diagnosis present

## 2023-07-18 DIAGNOSIS — F17210 Nicotine dependence, cigarettes, uncomplicated: Secondary | ICD-10-CM | POA: Diagnosis present

## 2023-07-18 DIAGNOSIS — Z885 Allergy status to narcotic agent status: Secondary | ICD-10-CM | POA: Diagnosis not present

## 2023-07-18 DIAGNOSIS — Z886 Allergy status to analgesic agent status: Secondary | ICD-10-CM

## 2023-07-18 DIAGNOSIS — Z888 Allergy status to other drugs, medicaments and biological substances status: Secondary | ICD-10-CM | POA: Diagnosis not present

## 2023-07-18 DIAGNOSIS — Z87820 Personal history of traumatic brain injury: Secondary | ICD-10-CM

## 2023-07-18 DIAGNOSIS — R4182 Altered mental status, unspecified: Secondary | ICD-10-CM | POA: Diagnosis not present

## 2023-07-18 DIAGNOSIS — G44009 Cluster headache syndrome, unspecified, not intractable: Secondary | ICD-10-CM | POA: Diagnosis present

## 2023-07-18 DIAGNOSIS — G3183 Dementia with Lewy bodies: Secondary | ICD-10-CM | POA: Diagnosis present

## 2023-07-18 DIAGNOSIS — Z7982 Long term (current) use of aspirin: Secondary | ICD-10-CM

## 2023-07-18 DIAGNOSIS — Z803 Family history of malignant neoplasm of breast: Secondary | ICD-10-CM | POA: Diagnosis not present

## 2023-07-18 DIAGNOSIS — G9349 Other encephalopathy: Secondary | ICD-10-CM | POA: Diagnosis present

## 2023-07-18 DIAGNOSIS — R569 Unspecified convulsions: Secondary | ICD-10-CM | POA: Diagnosis not present

## 2023-07-18 DIAGNOSIS — Z79899 Other long term (current) drug therapy: Secondary | ICD-10-CM

## 2023-07-18 DIAGNOSIS — G928 Other toxic encephalopathy: Secondary | ICD-10-CM | POA: Diagnosis present

## 2023-07-18 DIAGNOSIS — I6389 Other cerebral infarction: Secondary | ICD-10-CM | POA: Diagnosis not present

## 2023-07-18 DIAGNOSIS — I63531 Cerebral infarction due to unspecified occlusion or stenosis of right posterior cerebral artery: Principal | ICD-10-CM | POA: Diagnosis present

## 2023-07-18 DIAGNOSIS — Z1152 Encounter for screening for COVID-19: Secondary | ICD-10-CM | POA: Diagnosis not present

## 2023-07-18 DIAGNOSIS — Z8673 Personal history of transient ischemic attack (TIA), and cerebral infarction without residual deficits: Secondary | ICD-10-CM | POA: Diagnosis not present

## 2023-07-18 LAB — TROPONIN I (HIGH SENSITIVITY)
Troponin I (High Sensitivity): <2 ng/L (ref <18)
Troponin I (High Sensitivity): <2 ng/L (ref <18)

## 2023-07-18 LAB — CBC
HCT: 41.1 % (ref 39.0–52.0)
Hemoglobin: 14.1 g/dL (ref 13.0–17.0)
MCH: 33.3 pg (ref 26.0–34.0)
MCHC: 34.3 g/dL (ref 30.0–36.0)
MCV: 97.2 fL (ref 80.0–100.0)
Platelets: 239 10*3/uL (ref 150–400)
RBC: 4.23 MIL/uL (ref 4.22–5.81)
RDW: 12.9 % (ref 11.5–15.5)
WBC: 9.9 10*3/uL (ref 4.0–10.5)
nRBC: 0 % (ref 0.0–0.2)

## 2023-07-18 LAB — COMPREHENSIVE METABOLIC PANEL
ALT: 6 U/L (ref 0–44)
AST: 16 U/L (ref 15–41)
Albumin: 4.2 g/dL (ref 3.5–5.0)
Alkaline Phosphatase: 61 U/L (ref 38–126)
Anion gap: 8 (ref 5–15)
BUN: 21 mg/dL (ref 8–23)
CO2: 26 mmol/L (ref 22–32)
Calcium: 9.5 mg/dL (ref 8.9–10.3)
Chloride: 103 mmol/L (ref 98–111)
Creatinine, Ser: 0.93 mg/dL (ref 0.61–1.24)
GFR, Estimated: >60 mL/min (ref >60)
Glucose, Bld: 96 mg/dL (ref 70–99)
Potassium: 4 mmol/L (ref 3.5–5.1)
Sodium: 137 mmol/L (ref 135–145)
Total Bilirubin: 0.8 mg/dL (ref <1.2)
Total Protein: 6.6 g/dL (ref 6.5–8.1)

## 2023-07-18 LAB — DIFFERENTIAL
Abs Immature Granulocytes: 0.05 10*3/uL (ref 0.00–0.07)
Basophils Absolute: 0.1 10*3/uL (ref 0.0–0.1)
Basophils Relative: 1 %
Eosinophils Absolute: 0 10*3/uL (ref 0.0–0.5)
Eosinophils Relative: 0 %
Immature Granulocytes: 1 %
Lymphocytes Relative: 20 %
Lymphs Abs: 2 10*3/uL (ref 0.7–4.0)
Monocytes Absolute: 1.1 10*3/uL — ABNORMAL HIGH (ref 0.1–1.0)
Monocytes Relative: 11 %
Neutro Abs: 6.8 10*3/uL (ref 1.7–7.7)
Neutrophils Relative %: 67 %

## 2023-07-18 LAB — I-STAT CHEM 8, ED
BUN: 20 mg/dL (ref 8–23)
Calcium, Ion: 1.24 mmol/L (ref 1.15–1.40)
Chloride: 102 mmol/L (ref 98–111)
Creatinine, Ser: 1.1 mg/dL (ref 0.61–1.24)
Glucose, Bld: 95 mg/dL (ref 70–99)
HCT: 42 % (ref 39.0–52.0)
Hemoglobin: 14.3 g/dL (ref 13.0–17.0)
Potassium: 3.9 mmol/L (ref 3.5–5.1)
Sodium: 138 mmol/L (ref 135–145)
TCO2: 27 mmol/L (ref 22–32)

## 2023-07-18 LAB — CBG MONITORING, ED
Glucose-Capillary: 110 mg/dL — ABNORMAL HIGH (ref 70–99)
Glucose-Capillary: 110 mg/dL — ABNORMAL HIGH (ref 70–99)

## 2023-07-18 LAB — ETHANOL: Alcohol, Ethyl (B): <10 mg/dL (ref <10)

## 2023-07-18 LAB — PROTIME-INR
INR: 1 (ref 0.8–1.2)
Prothrombin Time: 13.5 s (ref 11.4–15.2)

## 2023-07-18 LAB — APTT: aPTT: 34 s (ref 24–36)

## 2023-07-18 MED ORDER — LORAZEPAM 2 MG/ML IJ SOLN
0.5000 mg | Freq: Once | INTRAMUSCULAR | Status: AC
  Administered 2023-07-18: 0.5 mg via INTRAVENOUS
  Filled 2023-07-18: qty 1

## 2023-07-18 MED ORDER — FENTANYL CITRATE PF 50 MCG/ML IJ SOSY
50.0000 ug | PREFILLED_SYRINGE | Freq: Once | INTRAMUSCULAR | Status: AC
Start: 2023-07-18 — End: 2023-07-18
  Administered 2023-07-18: 50 ug via INTRAVENOUS
  Filled 2023-07-18: qty 1

## 2023-07-18 MED ORDER — DIPHENHYDRAMINE HCL 50 MG/ML IJ SOLN
12.5000 mg | Freq: Once | INTRAMUSCULAR | Status: AC
  Administered 2023-07-18: 12.5 mg via INTRAVENOUS
  Filled 2023-07-18: qty 1

## 2023-07-18 MED ORDER — METOCLOPRAMIDE HCL 5 MG/ML IJ SOLN
5.0000 mg | Freq: Once | INTRAMUSCULAR | Status: AC
  Administered 2023-07-18: 5 mg via INTRAVENOUS
  Filled 2023-07-18: qty 2

## 2023-07-18 MED ORDER — IOHEXOL 350 MG/ML SOLN
80.0000 mL | Freq: Once | INTRAVENOUS | Status: AC | PRN
  Administered 2023-07-18: 80 mL via INTRAVENOUS

## 2023-07-18 NOTE — ED Notes (Signed)
Patients linens changed.

## 2023-07-18 NOTE — Telephone Encounter (Signed)
Return call to wife, who states her husband is in [place] long ED to rule out stroke. Wanted to see if Dr. Vickey Huger wanted to order MRI. I advised wife that we do not have privileges while patient being treated in hospital. Advised wife to reach back out after husband is discharged. She verbalized understanding.

## 2023-07-18 NOTE — ED Notes (Signed)
Nurse received pt resting in bed with wife at bedside. Wife asked nurse to take temp. Axillary temp 98.30f. Pt couldn't follow commands for oral temp.

## 2023-07-18 NOTE — ED Notes (Addendum)
Patient trying to get out of bed, unable to be still for MRI. Dr. Freida Busman verbal order for 0.5mg  ativan IV.

## 2023-07-18 NOTE — H&P (Signed)
History and Physical    Patient: Ernest Campbell ZOX:[id number] DOB: 02-17-1951 DOA: 07/18/2023 DOS: the patient was seen and examined on 07/18/2023 PCP: Margot Ables, MD (Inactive)  Patient coming from: Home  Chief Complaint:  Chief Complaint  Patient presents with   Chest Pain   Headache   Blurred Vision   HPI: REEGAN Campbell is a 72 y.o. male with medical history significant for traumatic brain injury 1 year ago which led to 180 days of severe cognitive and physical disability per the wife's report.  He has made a full recovery from those injuries and was active and alert and back to baseline.  This morning the patient woke up with a severe headache.  He does have a history of cluster headaches so it seemed not out of the ordinary.  They were able to get rid of the headache but around 1130 the patient started to have a headache again which is very unusual.  He also complained of some blurry vision and later double vision.  The wife said she heard some banging and went to the bathroom and the patient was in the bathroom and could not figure out how to get out of the bathroom.  None of this behavior is baseline.  He usually has some coffee while sitting in the recliner.  She found him laying on the floor on the table in front of the recliner.  The patient said he was not able to see the table.  He also needed help getting dressed he could not figure out how to put his shirt on himself.  None of that is baseline.  The patient's wife called the doctor and was instructed to bring him to the ER right away.  The patient never had any difficulty speaking or eating or swallowing or walking.  He walked independently into the ER.  He it was the headache and double vision and the behavior that was her primary concern. In the emergency department the patient did have a CT scan of the head which was unremarkable.  A code stroke was not called because the patient woke up with headache.  He did have  an MRI which revealed an acute nonhemorrhagic right PCA stroke.  The patient will be admitted for further workup and neurology consultation. Later in the emergency department the patient developed another headache.  He did receive Benadryl and fentanyl and Reglan.  And later received Ativan but the patient got very agitated and more confused after these medications.  He was not able to lay still to have the CTA that was ordered.   Review of Systems: unable to review all systems due to the inability of the patient to answer questions. The patient's wife answers all the questions.  She is an excellent historian. History reviewed. No pertinent past medical history. History reviewed. No pertinent surgical history. Social History:  reports that he has been smoking cigarettes. He does not have any smokeless tobacco history on file. He reports that he does not drink alcohol and does not use drugs.  Allergies  Allergen Reactions   Other Other (See Comments)    According to the patient, all opioids cause hallucinations   Prednisone Other (See Comments)    Hallucinations    Acetaminophen     Hallucinations   Codeine Other (See Comments)    Hallucinations   Morphine Other (See Comments)    Hallucinations   Oxycodone Other (See Comments)    Hallucinations    Family History  Problem Relation Age of Onset   Dementia Mother    Parkinsonism Father    Breast cancer Sister     Prior to Admission medications   Medication Sig Start Date End Date Taking? Authorizing Provider  Aspirin-Acetaminophen-Caffeine (GOODY HEADACHE PO) Take 1 packet by mouth 2 (two) times daily as needed (for headaches).   Yes [provider]  carbidopa-levodopa (SINEMET IR) 25-100 MG tablet Take 1 tablet by mouth 3 (three) times daily. Patient taking differently: Take 1 tablet by mouth 3 (three) times daily with meals. 01/31/23  Yes Glean Salvo, NP  ibuprofen (ADVIL) 200 MG tablet Take 600 mg by mouth 2 (two)  times daily as needed (for back pain).   Yes [provider]  donepezil (ARICEPT) 5 MG tablet Start taking 1 tablet at bedtime for 1 month, then take 2 tablets at bedtime Patient not taking: Reported on 07/18/2023 01/31/23   Glean Salvo, NP    Physical Exam: Vitals:   07/18/23 2200 07/18/23 2230 07/18/23 2300 07/18/23 2315  BP: 129/84 (!) 127/57 (!) 133/100 92/67  Pulse:  85 80 88  Resp:   16   Temp:      TempSrc:      SpO2:  100% 100% 100%  Weight:      Height:       Physical Exam:  General: The patient was sedated at the time of my exam HEENT: Normocephalic, atraumatic, PERRL Cardiovascular: Normal rate and rhythm. Distal pulses intact. Pulmonary: Normal pulmonary effort, normal breath sounds Gastrointestinal: Nondistended abdomen, soft, non-tender, normoactive bowel sounds Musculoskeletal:Normal ROM, no lower ext edema Skin: Skin is warm and dry. Neuro: patient sedated.  When I wake him up he is slightly agitated and not following commands.  He has observed to move each of his extremities purposefully and without clear evidence of weakness.  PSYCH: sedated  Data Reviewed:  Results for orders placed or performed during the hospital encounter of 07/18/23 (from the past 24 hours)  CBG monitoring, ED     Status: Abnormal   Collection Time: 07/18/23  3:08 PM  Result Value Ref Range   Glucose-Capillary 110 (H) 70 - 99 mg/dL  CBG monitoring, ED     Status: Abnormal   Collection Time: 07/18/23  3:19 PM  Result Value Ref Range   Glucose-Capillary 110 (H) 70 - 99 mg/dL  Troponin I (High Sensitivity)     Status: None   Collection Time: 07/18/23  3:23 PM  Result Value Ref Range   Troponin I (High Sensitivity) <2 <18 ng/L  Protime-INR     Status: None   Collection Time: 07/18/23  3:23 PM  Result Value Ref Range   Prothrombin Time 13.5 11.4 - 15.2 seconds   INR 1.0 0.8 - 1.2  APTT     Status: None   Collection Time: 07/18/23  3:23 PM  Result Value Ref Range   aPTT  34 24 - 36 seconds  CBC     Status: None   Collection Time: 07/18/23  3:23 PM  Result Value Ref Range   WBC 9.9 4.0 - 10.5 K/uL   RBC 4.23 4.22 - 5.81 MIL/uL   Hemoglobin 14.1 13.0 - 17.0 g/dL   HCT 16.1 09.6 - 04.5 %   MCV 97.2 80.0 - 100.0 fL   MCH 33.3 26.0 - 34.0 pg   MCHC 34.3 30.0 - 36.0 g/dL   RDW 40.9 81.1 - 91.4 %   Platelets 239 150 - 400 K/uL   nRBC  0.0 0.0 - 0.2 %  Differential     Status: Abnormal   Collection Time: 07/18/23  3:23 PM  Result Value Ref Range   Neutrophils Relative % 67 %   Neutro Abs 6.8 1.7 - 7.7 K/uL   Lymphocytes Relative 20 %   Lymphs Abs 2.0 0.7 - 4.0 K/uL   Monocytes Relative 11 %   Monocytes Absolute 1.1 (H) 0.1 - 1.0 K/uL   Eosinophils Relative 0 %   Eosinophils Absolute 0.0 0.0 - 0.5 K/uL   Basophils Relative 1 %   Basophils Absolute 0.1 0.0 - 0.1 K/uL   Immature Granulocytes 1 %   Abs Immature Granulocytes 0.05 0.00 - 0.07 K/uL  Comprehensive metabolic panel     Status: None   Collection Time: 07/18/23  3:23 PM  Result Value Ref Range   Sodium 137 135 - 145 mmol/L   Potassium 4.0 3.5 - 5.1 mmol/L   Chloride 103 98 - 111 mmol/L   CO2 26 22 - 32 mmol/L   Glucose, Bld 96 70 - 99 mg/dL   BUN 21 8 - 23 mg/dL   Creatinine, Ser 0.98 0.61 - 1.24 mg/dL   Calcium 9.5 8.9 - 11.9 mg/dL   Total Protein 6.6 6.5 - 8.1 g/dL   Albumin 4.2 3.5 - 5.0 g/dL   AST 16 15 - 41 U/L   ALT 6 0 - 44 U/L   Alkaline Phosphatase 61 38 - 126 U/L   Total Bilirubin 0.8 <1.2 mg/dL   GFR, Estimated >14 >78 mL/min   Anion gap 8 5 - 15  Ethanol     Status: None   Collection Time: 07/18/23  3:23 PM  Result Value Ref Range   Alcohol, Ethyl (B) <10 <10 mg/dL  I-stat chem 8, ED     Status: None   Collection Time: 07/18/23  3:28 PM  Result Value Ref Range   Sodium 138 135 - 145 mmol/L   Potassium 3.9 3.5 - 5.1 mmol/L   Chloride 102 98 - 111 mmol/L   BUN 20 8 - 23 mg/dL   Creatinine, Ser 2.95 0.61 - 1.24 mg/dL   Glucose, Bld 95 70 - 99 mg/dL   Calcium, Ion 6.21  3.08 - 1.40 mmol/L   TCO2 27 22 - 32 mmol/L   Hemoglobin 14.3 13.0 - 17.0 g/dL   HCT 65.7 84.6 - 96.2 %  Troponin I (High Sensitivity)     Status: None   Collection Time: 07/18/23  5:28 PM  Result Value Ref Range   Troponin I (High Sensitivity) <2 <18 ng/L     Assessment and Plan: Acute right PCA stroke - Echo, CTA neck, PT/OT/Speech per protocol - Await recommendations from neurology.  The patient follows with Dr. Vickey Huger (neurology) as an outpatient - Aspirin x 1.  He had trouble with very severe bruising when he was on aspirin in the past.    Advance Care Planning:   Code Status: Not on file the patient will be full code by default.  CODE STATUS discussion was deferred as he was sedated.  Consults: Neurology  Family Communication: The patient's wife is at bedside and gives the full history  Severity of Illness: The appropriate patient status for this patient is INPATIENT. Inpatient status is judged to be reasonable and necessary in order to provide the required intensity of service to ensure the patient's safety. The patient's presenting symptoms, physical exam findings, and initial radiographic and laboratory data in the context of their chronic comorbidities  is felt to place them at high risk for further clinical deterioration. Furthermore, it is not anticipated that the patient will be medically stable for discharge from the hospital within 2 midnights of admission.   * I certify that at the point of admission it is my clinical judgment that the patient will require inpatient hospital care spanning beyond 2 midnights from the point of admission due to high intensity of service, high risk for further deterioration and high frequency of surveillance required.*  Author: Buena Irish, MD 07/18/2023 11:55 PM  For on call review [email or web address].

## 2023-07-18 NOTE — ED Provider Notes (Signed)
[place] EMERGENCY DEPARTMENT AT [facility] Provider Note   CSN: [id number] Arrival date & time: 07/18/23  1441     History  Chief Complaint  Patient presents with   Chest Pain   Headache   Blurred Vision    Ernest Campbell is a 72 y.o. male.  72 year old male presents with headaches and diplopia that began today.  Patient awoke with the symptoms.  Has history of cluster headaches and this is similar.  States he currently has a headache but not having some diplopia since that time.  Patient states that the headaches have been bitemporal and was today relieved with taking Goody powders as well as ibuprofen.  Patient's gait is normally off due to prior low back issues.  He has not had any fever or neck pain or vomiting.  No focal weakness in his arms or legs.  Has been having recurrent chest pain over the last several days.  States that the pain is not exertional.  Not associated with nausea, diaphoresis, shortness of breath.  Had 1 and bout of chest pain today was last for proxy 15 minutes and that occurred at rest and was resolved spontaneously.       Home Medications Prior to Admission medications   Medication Sig Start Date End Date Taking? Authorizing Provider  alfuzosin (UROXATRAL) 10 MG 24 hr tablet Take 10 mg by mouth daily with breakfast.    [provider]  carbidopa-levodopa (SINEMET IR) 25-100 MG tablet Take 1 tablet by mouth 3 (three) times daily. 01/31/23   Glean Salvo, NP  donepezil (ARICEPT) 5 MG tablet Start taking 1 tablet at bedtime for 1 month, then take 2 tablets at bedtime 01/31/23   Glean Salvo, NP  ibuprofen (ADVIL) 200 MG tablet Take 200 mg by mouth every 6 (six) hours as needed.    [provider]      Allergies    Other, Prednisone, Acetaminophen, Codeine, Morphine, Oxycodone, and Oxycodone hcl    Review of Systems   Review of Systems  All other systems reviewed and are negative.   Physical Exam Updated Vital  Signs BP 125/64   Pulse 75   Temp 97.7 F (36.5 C)   Resp 17   Ht 1.702 m (5\' 7" )   Wt 63.5 kg   SpO2 100%   BMI 21.93 kg/m  Physical Exam Vitals and nursing note reviewed.  Constitutional:      General: He is not in acute distress.    Appearance: Normal appearance. He is well-developed. He is not toxic-appearing.  HENT:     Head: Normocephalic and atraumatic.  Eyes:     General: Lids are normal. No visual field deficit.    Conjunctiva/sclera: Conjunctivae normal.     Pupils: Pupils are equal, round, and reactive to light.  Neck:     Thyroid: No thyroid mass.     Trachea: No tracheal deviation.  Cardiovascular:     Rate and Rhythm: Normal rate and regular rhythm.     Heart sounds: Normal heart sounds. No murmur heard.    No gallop.  Pulmonary:     Effort: Pulmonary effort is normal. No respiratory distress.     Breath sounds: Normal breath sounds. No stridor. No decreased breath sounds, wheezing, rhonchi or rales.  Abdominal:     General: There is no distension.     Palpations: Abdomen is soft.     Tenderness: There is no abdominal tenderness. There is no rebound.  Musculoskeletal:        General: No tenderness. Normal range of motion.     Cervical back: Normal range of motion and neck supple.  Skin:    General: Skin is warm and dry.     Findings: No abrasion or rash.  Neurological:     General: No focal deficit present.     Mental Status: He is alert and oriented to person, place, and time. Mental status is at baseline.     GCS: GCS eye subscore is 4. GCS verbal subscore is 5. GCS motor subscore is 6.     Cranial Nerves: Cranial nerves 2-12 are intact. No cranial nerve deficit.     Sensory: No sensory deficit.     Motor: Tremor present. No weakness.     Coordination: Finger-Nose-Finger Test normal.  Psychiatric:        Attention and Perception: Attention normal.        Speech: Speech normal.        Behavior: Behavior normal.     ED Results / Procedures /  Treatments   Labs (all labs ordered are listed, but only abnormal results are displayed) Labs Reviewed  DIFFERENTIAL - Abnormal; Notable for the following components:      Result Value   Monocytes Absolute 1.1 (*)    All other components within normal limits  CBG MONITORING, ED - Abnormal; Notable for the following components:   Glucose-Capillary 110 (*)    All other components within normal limits  CBG MONITORING, ED - Abnormal; Notable for the following components:   Glucose-Capillary 110 (*)    All other components within normal limits  CBC  PROTIME-INR  APTT  COMPREHENSIVE METABOLIC PANEL  ETHANOL  I-STAT CHEM 8, ED  TROPONIN I (HIGH SENSITIVITY)    EKG EKG Interpretation Date/Time:  Thursday July 18 2023 15:10:03 EST Ventricular Rate:  79 PR Interval:  150 QRS Duration:  74 QT Interval:  340 QTC Calculation: 389 R Axis:   71  Text Interpretation: Normal sinus rhythm Septal infarct , age undetermined Abnormal ECG No previous ECGs available Confirmed by Lorre Nick ([id number]) on 07/18/2023 3:17:24 PM  Radiology No results found.  Procedures Procedures    Medications Ordered in ED Medications - No data to display  ED Course/ Medical Decision Making/ A&P                                 Medical Decision Making Amount and/or Complexity of Data Reviewed Labs: ordered. Radiology: ordered.  Risk Prescription drug management.   Patient is EKG per interpretation shows normal sinus rhythm.  Patient given medication for his headache which reoccurred.  Head CT per my interpretation showed no acute findings.  Patient continued to no diplopia. Patient did require Ativan for his MRI due to increased agitation. 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  Blood pressure stable at this time.  No acute intervention needed. Will consult neurology due to his acute stroke.  Will likely admit to the  hospitalist team or at [facility].  Family at bedside and notified along with patient  CRITICAL CARE Performed by: Toy Baker Total critical care time: 50 minutes Critical care time was exclusive of separately billable procedures and treating other patients. Critical care was necessary to treat or prevent imminent or life-threatening deterioration. Critical care was time spent personally by me  on the following activities: development of treatment plan with patient and/or surrogate as well as nursing, discussions with consultants, evaluation of patient's response to treatment, examination of patient, obtaining history from patient or surrogate, ordering and performing treatments and interventions, ordering and review of laboratory studies, ordering and review of radiographic studies, pulse oximetry and re-evaluation of patient's condition.         Final Clinical Impression(s) / ED Diagnoses Final diagnoses:  None    Rx / DC Orders ED Discharge Orders     None         Lorre Nick, MD 07/18/23 [phone]

## 2023-07-18 NOTE — Telephone Encounter (Signed)
Pt's wife states pt is currently hospitalized and would like to go over the current issue's with a Engineer, maintenance (IT).  Requesting call back

## 2023-07-18 NOTE — H&P (Incomplete)
  History and Physical    Patient: Ernest Campbell HQI:[id number] DOB: [birth year] DOA: 07/18/2023 DOS: the patient was seen and examined on 07/18/2023 PCP: Ernest Ables, MD (Inactive)  Patient coming from: Home  Chief Complaint:  Chief Complaint  Patient presents with  . Chest Pain  . Headache  . Blurred Vision   HPI: Ernest Campbell is a 72 y.o. 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 The patient's wife answers all the questions.  She is an excellent historian. History reviewed. No pertinent past medical history. History reviewed. No pertinent surgical history. Social History:  reports that he has been smoking cigarettes. He does not have any smokeless tobacco history on file. He reports that he does not drink alcohol and does not use drugs.  Allergies  Allergen Reactions  . Other Other (See Comments)    According to the patient, all opioids cause hallucinations  . Prednisone Other (See Comments)    Hallucinations   . Acetaminophen     Hallucinations  . Codeine Other (See Comments)    Hallucinations  . Morphine Other (See Comments)    Hallucinations  . Oxycodone Other (See Comments)    Hallucinations    Family History  Problem Relation Age of Onset  . Dementia Mother   . Parkinsonism Father   . Breast cancer Sister     Prior to Admission medications   Medication Sig Start Date End Date Taking? Authorizing Provider  Aspirin-Acetaminophen-Caffeine (GOODY HEADACHE PO) Take 1 packet by mouth 2 (two) times daily as needed (for headaches).   Yes [provider]  carbidopa-levodopa (SINEMET IR) 25-100 MG tablet Take 1 tablet by mouth 3 (three) times daily. Patient taking differently: Take 1 tablet by mouth 3 (three) times daily with meals. 01/31/23  Yes Ernest Salvo, NP   ibuprofen (ADVIL) 200 MG tablet Take 600 mg by mouth 2 (two) times daily as needed (for back pain).   Yes [provider]  donepezil (ARICEPT) 5 MG tablet Start taking 1 tablet at bedtime for 1 month, then take 2 tablets at bedtime Patient not taking: Reported on 07/18/2023 01/31/23   Ernest Salvo, NP    Physical Exam: Vitals:   07/18/23 2200 07/18/23 2230 07/18/23 2300 07/18/23 2315  BP: 129/84 (!) 127/57 (!) 133/100 92/67  Pulse:  85 80 88  Resp:   16   Temp:      TempSrc:      SpO2:  100% 100% 100%  Weight:      Height:       *** Data Reviewed: {Tip this will not be part of the note when signed- Document your independent interpretation of telemetry tracing, EKG, lab, Radiology test or any other diagnostic tests. Add any new diagnostic test ordered today. (Optional):26781} {Results:26384}  Assessment and Plan: No notes have been filed under this hospital service. Service: Hospitalist     Advance Care Planning:   Code Status: Not on file ***  Consults: ***  Family Communication: ***  Severity of Illness: {Observation/Inpatient:21159}  Author: Buena Irish, MD 07/18/2023 11:55 PM  For on call review [email or web address].

## 2023-07-18 NOTE — ED Notes (Signed)
Patient transported to MRI 

## 2023-07-18 NOTE — ED Triage Notes (Addendum)
Pt arrives via POV. Pt reports intermittent chest pain for the past two weeks. He woke up this morning around 0830 with a bad headache. Since then family reports he has had difficulty doing normal tasks at home. Pt arrives AxOx4. Pt currently denies headache or chest pain at this time. Pt states he does currently have blurred and double vision in both eyes. Denies dizziness. Pt has hx brain bleed back in 2022.

## 2023-07-19 ENCOUNTER — Inpatient Hospital Stay (HOSPITAL_COMMUNITY): Payer: Medicare Other

## 2023-07-19 DIAGNOSIS — R569 Unspecified convulsions: Secondary | ICD-10-CM

## 2023-07-19 DIAGNOSIS — R4182 Altered mental status, unspecified: Secondary | ICD-10-CM | POA: Diagnosis not present

## 2023-07-19 DIAGNOSIS — I6389 Other cerebral infarction: Secondary | ICD-10-CM

## 2023-07-19 DIAGNOSIS — I639 Cerebral infarction, unspecified: Secondary | ICD-10-CM | POA: Diagnosis not present

## 2023-07-19 LAB — URINALYSIS, ROUTINE W REFLEX MICROSCOPIC
Bilirubin Urine: NEGATIVE
Glucose, UA: NEGATIVE mg/dL
Hgb urine dipstick: NEGATIVE
Ketones, ur: NEGATIVE mg/dL
Leukocytes,Ua: NEGATIVE
Nitrite: NEGATIVE
Protein, ur: NEGATIVE mg/dL
Specific Gravity, Urine: 1.014 (ref 1.005–1.030)
pH: 5 (ref 5.0–8.0)

## 2023-07-19 LAB — SEDIMENTATION RATE: Sed Rate: 1 mm/h (ref 0–16)

## 2023-07-19 LAB — ECHOCARDIOGRAM COMPLETE
Height: 67 in
S' Lateral: 2.6 cm
Weight: 2240 [oz_av]

## 2023-07-19 LAB — LIPID PANEL
Cholesterol: 189 mg/dL (ref 0–200)
HDL: 51 mg/dL (ref >40)
LDL Cholesterol: 128 mg/dL — ABNORMAL HIGH (ref 0–99)
Total CHOL/HDL Ratio: 3.7 {ratio}
Triglycerides: 48 mg/dL (ref <150)
VLDL: 10 mg/dL (ref 0–40)

## 2023-07-19 LAB — C-REACTIVE PROTEIN: CRP: 0.5 mg/dL (ref <1.0)

## 2023-07-19 LAB — HIV ANTIBODY (ROUTINE TESTING W REFLEX): HIV Screen 4th Generation wRfx: NONREACTIVE

## 2023-07-19 LAB — HEMOGLOBIN A1C
Hgb A1c MFr Bld: 5.8 % — ABNORMAL HIGH (ref 4.8–5.6)
Mean Plasma Glucose: 119.76 mg/dL

## 2023-07-19 MED ORDER — HALOPERIDOL LACTATE 5 MG/ML IJ SOLN
INTRAMUSCULAR | Status: AC
  Administered 2023-07-19: 5 mg
  Filled 2023-07-19: qty 1

## 2023-07-19 MED ORDER — ASPIRIN 81 MG PO CHEW
324.0000 mg | CHEWABLE_TABLET | Freq: Once | ORAL | Status: AC
  Administered 2023-07-19: 324 mg via ORAL
  Filled 2023-07-19: qty 4

## 2023-07-19 MED ORDER — SODIUM CHLORIDE 0.9 % IV SOLN
2.0000 g | Freq: Two times a day (BID) | INTRAVENOUS | Status: DC
  Administered 2023-07-19 – 2023-07-21 (×5): 2 g via INTRAVENOUS
  Filled 2023-07-19 (×5): qty 20

## 2023-07-19 MED ORDER — DEXTROSE 5 % IV SOLN
650.0000 mg | Freq: Three times a day (TID) | INTRAVENOUS | Status: DC
  Administered 2023-07-19 – 2023-07-20 (×4): 650 mg via INTRAVENOUS
  Filled 2023-07-19 (×6): qty 13

## 2023-07-19 MED ORDER — DONEPEZIL HCL 10 MG PO TABS
5.0000 mg | ORAL_TABLET | Freq: Every day | ORAL | Status: DC
  Administered 2023-07-19 – 2023-07-20 (×2): 5 mg via ORAL
  Filled 2023-07-19 (×3): qty 1

## 2023-07-19 MED ORDER — LACTATED RINGERS IV SOLN: INTRAVENOUS | Status: AC

## 2023-07-19 MED ORDER — HALOPERIDOL LACTATE 5 MG/ML IJ SOLN
2.0000 mg | Freq: Once | INTRAMUSCULAR | Status: DC
  Filled 2023-07-19: qty 1

## 2023-07-19 MED ORDER — VANCOMYCIN HCL 1250 MG/250ML IV SOLN
1250.0000 mg | Freq: Once | INTRAVENOUS | Status: AC
  Administered 2023-07-19: 1250 mg via INTRAVENOUS
  Filled 2023-07-19: qty 250

## 2023-07-19 MED ORDER — SENNOSIDES-DOCUSATE SODIUM 8.6-50 MG PO TABS: 1.0000 | ORAL_TABLET | Freq: Every evening | ORAL | Status: DC | PRN

## 2023-07-19 MED ORDER — ASPIRIN 81 MG PO TBEC
81.0000 mg | DELAYED_RELEASE_TABLET | Freq: Every day | ORAL | Status: DC
  Administered 2023-07-19 – 2023-07-21 (×3): 81 mg via ORAL
  Filled 2023-07-19 (×3): qty 1

## 2023-07-19 MED ORDER — ENOXAPARIN SODIUM 40 MG/0.4ML IJ SOSY
40.0000 mg | PREFILLED_SYRINGE | INTRAMUSCULAR | Status: DC
  Administered 2023-07-19 – 2023-07-20 (×2): 40 mg via SUBCUTANEOUS
  Filled 2023-07-19 (×2): qty 0.4

## 2023-07-19 MED ORDER — CARBIDOPA-LEVODOPA 25-100 MG PO TABS
1.0000 | ORAL_TABLET | Freq: Three times a day (TID) | ORAL | Status: DC
  Administered 2023-07-19 – 2023-07-21 (×8): 1 via ORAL
  Filled 2023-07-19 (×9): qty 1

## 2023-07-19 MED ORDER — AMPICILLIN SODIUM 2 G IJ SOLR
2.0000 g | INTRAMUSCULAR | Status: DC
  Administered 2023-07-19 – 2023-07-21 (×11): 2 g via INTRAVENOUS
  Filled 2023-07-19 (×13): qty 2000

## 2023-07-19 MED ORDER — VANCOMYCIN HCL 500 MG/100ML IV SOLN
500.0000 mg | Freq: Two times a day (BID) | INTRAVENOUS | Status: DC
  Administered 2023-07-19 – 2023-07-21 (×3): 500 mg via INTRAVENOUS
  Filled 2023-07-19 (×4): qty 100

## 2023-07-19 MED ORDER — HALOPERIDOL LACTATE 5 MG/ML IJ SOLN: 3.0000 mg | Freq: Once | INTRAMUSCULAR | Status: DC

## 2023-07-19 MED ORDER — STROKE: EARLY STAGES OF RECOVERY BOOK
Freq: Once | Status: AC
  Filled 2023-07-19: qty 1

## 2023-07-19 MED ORDER — ORAL CARE MOUTH RINSE: 15.0000 mL | OROMUCOSAL | Status: DC | PRN

## 2023-07-19 MED ORDER — LORAZEPAM 2 MG/ML IJ SOLN: 2.0000 mg | Freq: Once | INTRAMUSCULAR | Status: DC

## 2023-07-19 MED ORDER — OLANZAPINE 10 MG IM SOLR
2.5000 mg | Freq: Once | INTRAMUSCULAR | Status: AC
Start: 2023-07-19 — End: 2023-07-19
  Administered 2023-07-19: 2.5 mg via INTRAMUSCULAR
  Filled 2023-07-19: qty 10

## 2023-07-19 MED ORDER — HALOPERIDOL LACTATE 5 MG/ML IJ SOLN
5.0000 mg | Freq: Once | INTRAMUSCULAR | Status: AC
Start: 2023-07-19 — End: 2023-07-19
  Administered 2023-07-19: 5 mg via INTRAVENOUS
  Filled 2023-07-19: qty 1

## 2023-07-19 NOTE — Progress Notes (Signed)
Patient continues to pull lines, tele sitter alerted that patient need to be helped in bed because her was sideways with his legs hanging off.  Wife was in room, on phone not facing patient.  During bedside report, wife asked that we remove mittens because they were making the patient agitated, I told her that we can try him out of mitts if she stays at the bedside and keeps him from pulling at lines, and she agreed that she would.  Once myself and the charge nurse, Marchelle Folks, placed the patient back correctly in bed, we applied the mittens because the patient's condom cath had been pulled off and he was wet, and his IV site was dripping blood from being pulled.  We then walked out of the room, and within seconds that wife came to the door, yelling at myself and the charge nurse to "take those gd mittens off my husband".  We both attempted to explain that he needs the mittens to keep his lines so he can get the antibiotics he needs.  She was irate, told many times to stop yelling in the hallway.  I told her that she is impeding the care of the patient and I would have to ask her to leave, she refused.  Security was called to the unit, they explained that because she was yelling and not following staff rules that she is being asked to leave and now trespassing.  Once she understood that one of the officers was actual [employer], she asked to stay and I said no, that she could return tomorrow and only during regular visitor hours, no staying overnight.  Patient complied with Security, gathered her belongs and left.  Patient was then found to be increasing agitated , on hands and knees in bed and attempting to hit staff as we tried to get him back in bed correctly.  Dr Minerva Areola made aware, new orders received to include restraints.  Will continue to monitor.

## 2023-07-19 NOTE — Progress Notes (Signed)
Pt is severely agitated and trying to hit staff. Haldol was ordered last shift but not yet given. Plan to give Haldol now and use restraints for now d/t concern for patient and staff safety.

## 2023-07-19 NOTE — ED Notes (Signed)
Nurse unable to perform NIH SS. Pt cannot follow commands, spouse stated she believes this is an adverse reaction to ativan. Nurse made MD aware.

## 2023-07-19 NOTE — ED Notes (Signed)
Nurse gave report to [facility].

## 2023-07-19 NOTE — Progress Notes (Signed)
EEG complete - results pending 

## 2023-07-19 NOTE — Progress Notes (Signed)
  Inpatient Rehabilitation Admissions Coordinator   Met with patient at bedside for rehab assessment. No family at bedside. They had just returned home. I contacted his wife by phone. His previous rehab was provided in the home. We discussed goals and expectations of a CIR admit. Discussed that it is too early to know what rehab venue will be needed. We will follow up next week. Please call me with any questions.   Ottie Glazier, RN, MSN Rehab Admissions Coordinator [phone]

## 2023-07-19 NOTE — Progress Notes (Signed)
? ?  Inpatient Rehab Admissions Coordinator : ? ?Per therapy recommendations, patient was screened for CIR candidacy by Blue Ruggerio RN MSN.  At this time patient appears to be a potential candidate for CIR. I will place a rehab consult per protocol for full assessment. Please call me with any questions. ? ?Jerriyah Louis RN MSN ?Admissions Coordinator ?[phone] ?  ?

## 2023-07-19 NOTE — Hospital Course (Addendum)
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  He has made a full recovery from those injuries and was active and alert and back to baseline.Marland Kitchen He iwoke up with a severe headache, does have a history of cluster headaches so it seemed not out of the ordinary.  They were able to get rid of the headache but around 1130 the patient started to have a headache again which is very unusual.  He also complained of some blurry vision and later double vision.  The wife said she heard some banging and went to the bathroom and the patient was in the bathroom and could not figure out how to get out of the bathroom.  None of this behavior is baseline.  He usually has some coffee while sitting in the recliner.  She found him laying on the floor on the table in front of the recliner. The patient said he was not able to see the table.  He also needed help getting dressed he could not figure out how to put his shirt on himself.  None of that is baseline.  The patient's wife called the doctor and was instructed to bring him to the ER right away.  The patient never had any difficulty speaking or eating or swallowing or walking.  He walked independently into the ER.  He it was the headache and double vision and the behavior that was her primary concern. In the ED: Vitals fairly stable afebrile Unremarkable labs CT scan of the head which was unremarkable. MRI which revealed an acute nonhemorrhagic right PCA stroke. Neurology was consulted and admitted for further management Later in the emergency department the patient developed another headache.  He did receive Benadryl and fentanyl and Reglan.  And later received Ativan but the patient got very agitated and more confused after these medications.  He was not able to lay still to have the CTA that was ordered. Seen by neurology placed on empiric antibiotics for meningitis 12/14:  Underwent LP,  CT angio head and neck- no LVO Per neuro no further work up- okay for stopping antibiotics and okay for dc home today.

## 2023-07-19 NOTE — Procedures (Addendum)
LUMBAR PUNCTURE (SPINAL TAP) PROCEDURE NOTE  Indication: AMS    Proceduralists: Gevena Mart NP/Dr. Amada Jupiter    Risks, benefits and alternatives of the procedure were dicussed with the patient including but not limited to post-LP headache, bleeding, infection, weakness/numbness of legs(radiculopathy), death.    Consent obtained from: patient's wife Dreydan Sotto   Procedure Note The patient was prepped and draped, and using sterile technique a 20 gauge quinke spinal needle was inserted in the L4-5 space despite multiple attempts, bony resistance was repeatedly met.  Patient tolerated the procedure well and blood loss was minimal.    Gevena Mart DNP, ACNPC-AG  [facility]  I was present for the entirety of the above procedure.  Ritta Slot, MD [facility] [phone]  If 7pm- 7am, please page neurology on call as listed in AMION.

## 2023-07-19 NOTE — Progress Notes (Signed)
Pt doesn't follow instructions, unable to complete NIHSS scale

## 2023-07-19 NOTE — Progress Notes (Signed)
  Echocardiogram 2D Echocardiogram has been performed. Patient unable to tolerate exam. Patient pushed probe away.   Ernest Campbell 07/19/2023, 2:55 PM

## 2023-07-19 NOTE — Progress Notes (Signed)
PHARMACY ANTIBIOTIC CONSULT NOTE   Ernest Campbell a 72 y.o. male presenting with stroke. Also concern for meningitis/encephalitis given acute HA and neck stiffness Pharmacy has been consulted for acyclovir, vancomycin, ampicillin, CRO dosing.  12/12: Scr 1.10 (~BL), WBC 9.9  Vital Signs: afebrile, BP soft, HR WNL   Estimated Creatinine Clearance: 56.1 mL/min (by C-G formula based on SCr of 1.1 mg/dL).  Plan: START Vancomycin 1,500 mg IV x1, followed by 500 mg IV Q12H (dosing via nomogram, trough based dosing)  START Ceftriaxone 2g IV Q12H  START ampicillin 2g IV Q4H  START acyclovir 650 mg IV Q8H ( ~10 mg/kg)  START LR @125  mL/hour (patient must remain on mIVF while on acyclovir to minimize risk of post-renal AKI)  Monitor renal function, F/U LP results, F/U de-escalation   Allergies:  Allergies  Allergen Reactions   Other Other (See Comments)    According to the patient, all opioids cause hallucinations   Prednisone Other (See Comments)    Hallucinations    Acetaminophen     Hallucinations   Codeine Other (See Comments)    Hallucinations   Morphine Other (See Comments)    Hallucinations   Oxycodone Other (See Comments)    Hallucinations    Filed Weights   07/18/23 1457  Weight: 63.5 kg (140 lb)       Latest Ref Rng & Units 07/18/2023    3:28 PM 07/18/2023    3:23 PM 04/27/2021    7:15 PM  CBC  WBC 4.0 - 10.5 K/uL  9.9  9.0   Hemoglobin 13.0 - 17.0 g/dL 16.6  06.3  01.6   Hematocrit 39.0 - 52.0 % 42.0  41.1  40.6   Platelets 150 - 400 K/uL  239  309     Antibiotics Given (last 72 hours)     None       Antimicrobials this admission: Vancomycin 12/13>>c Acyclovir 12/13>>c  Ampicillin 12/13>>c  Ceftriaxone 12/13>c   Microbiology results: none  Thank you for allowing pharmacy to be a part of this patient's care.  Jani Gravel, PharmD Clinical Pharmacist  07/19/2023 10:25 AM

## 2023-07-19 NOTE — Progress Notes (Signed)
Pt wife refused to have pt get Aricept, she said they stopped taking at home a long time ago

## 2023-07-19 NOTE — Evaluation (Signed)
Physical Therapy Evaluation Patient Details Name: Ernest Campbell MRN: [id number] DOB: 30-Apr-1951 Today's Date: 07/19/2023  History of Present Illness  72 y.o. male Presents to ED 12/13 with complaints of headache, and double vision. 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  He has made a full recovery and was active and alert and back to baseline.  Clinical Impression  PTA pt living with his wife in multistory home with 6 steps to enter. Per wife pt was independent in community ambulation and driving. Pt unable to participate in questions regarding PLOF and home set up. Pt currently with functional limitations due to the deficits listed below (see PT Problem List). Patient will benefit from intensive inpatient follow up therapy, >3 hours/day. PT will continue to follow acutely.          If plan is discharge home, recommend the following: A lot of help with walking and/or transfers;A lot of help with bathing/dressing/bathroom;Assistance with cooking/housework;Assistance with feeding;Direct supervision/assist for medications management;Direct supervision/assist for financial management;Assist for transportation;Help with stairs or ramp for entrance;Supervision due to cognitive status   Can travel by private vehicle        Equipment Recommendations Wheelchair (measurements PT);Wheelchair cushion (measurements PT)  Recommendations for Other Services  Rehab consult    Functional Status Assessment Patient has had a recent decline in their functional status and demonstrates the ability to make significant improvements in function in a reasonable and predictable amount of time.     Precautions / Restrictions Precautions Precautions: Fall Restrictions Weight Bearing Restrictions Per Provider Order: No      Mobility  Bed Mobility Overal bed  mobility: Needs Assistance Bed Mobility: Supine to Sit, Sit to Supine     Supine to sit: Mod assist, +2 for physical assistance Sit to supine: Total assist, +2 for physical assistance   General bed mobility comments: mod physical Ax2, maximal cuing, and hand over hand placement of hand on bed rail. Pt did scoot hips towards EOB while seated when provided VC    Transfers Overall transfer level: Needs assistance Equipment used: None, 2 person hand held assist Transfers: Sit to/from Stand, Bed to chair/wheelchair/BSC Sit to Stand: Mod assist, +2 physical assistance, +2 safety/equipment, From elevated surface           General transfer comment: VC provided to iniitate sit to stand with 2 person assist. Pt with strong posterior bias. Unable to take steps forward toward window when cued. [place] steps taken when cued to side step towards HOB.         Balance Overall balance assessment: Needs assistance, History of Falls Sitting-balance support: Bilateral upper extremity supported, Feet supported Sitting balance-Leahy Scale: Zero Sitting balance - Comments: strong posterior bias. Able to intermittently maintain sitting balance with Min A. Posterior and anterior support used for safety. Postural control: Posterior lean Standing balance support: Bilateral upper extremity supported Standing balance-Leahy Scale: Poor Standing balance comment: standing EOB. Reliant on 2 person assist to maintain standing balance. Fluctuated between requiring max physical assist to Mod with 2 person.                             Pertinent Vitals/Pain Pain Assessment Pain Assessment: Faces Faces Pain Scale: No hurt    Home Living Family/patient expects to be discharged to:: Private residence Living Arrangements:  Spouse/significant other Available Help at Discharge: Family;Available 24 hours/day Type of Home: House Home Access: Stairs to enter Entrance Stairs-Rails: None Entrance  Stairs-Number of Steps: 6   Home Layout: Two level;Able to live on main level with bedroom/bathroom Home Equipment: Cane - single point;Rollator (4 wheels);Rolling Walker (2 wheels);Grab bars - tub/shower      Prior Function Prior Level of Function : Independent/Modified Independent;Driving             Mobility Comments: Was not using any AD at baseline ADLs Comments: Wife manages finances     Extremity/Trunk Assessment   Upper Extremity Assessment Upper Extremity Assessment: Defer to OT evaluation    Lower Extremity Assessment Lower Extremity Assessment: Difficult to assess due to impaired cognition;Generalized weakness    Cervical / Trunk Assessment Cervical / Trunk Assessment: Kyphotic  Communication   Communication Communication: Difficulty following commands/understanding;Difficulty communicating thoughts/reduced clarity of speech Following commands: Follows one step commands inconsistently Cueing Techniques: Verbal cues;Gestural cues;Tactile cues;Visual cues  Cognition Arousal: Alert Behavior During Therapy: Restless, Flat affect, Impulsive Overall Cognitive Status: Impaired/Different from baseline Area of Impairment: Orientation, Attention, Following commands, Safety/judgement, Awareness, Problem solving                 Orientation Level: Disoriented to, Place, Time, Situation Current Attention Level: Selective   Following Commands: Follows one step commands inconsistently, Follows multi-step commands inconsistently Safety/Judgement: Decreased awareness of safety, Decreased awareness of deficits Awareness: Intellectual Problem Solving: Slow processing, Decreased initiation, Difficulty sequencing, Requires verbal cues, Requires tactile cues General Comments: impaired command follow, difficult to distinguish how much visual deficits are impairing ability to follow commands but definitely contributary        General Comments General comments (skin  integrity, edema, etc.): VSS on RA        Assessment/Plan    PT Assessment Patient needs continued PT services  PT Problem List Decreased strength;Decreased range of motion;Decreased activity tolerance;Decreased balance;Decreased mobility;Decreased coordination;Decreased cognition;Decreased knowledge of use of DME;Decreased safety awareness;Decreased knowledge of precautions       PT Treatment Interventions DME instruction;Gait training;Stair training;Functional mobility training;Therapeutic activities;Therapeutic exercise;Balance training;Neuromuscular re-education;Cognitive remediation;Patient/family education;Wheelchair mobility training    PT Goals (Current goals can be found in the Care Plan section)  Acute Rehab PT Goals PT Goal Formulation: Patient unable to participate in goal setting Time For Goal Achievement: 08/02/23 Potential to Achieve Goals: Fair    Frequency Min 1X/week     Co-evaluation PT/OT/SLP Co-Evaluation/Treatment: Yes Reason for Co-Treatment: To address functional/ADL transfers PT goals addressed during session: Mobility/safety with mobility OT goals addressed during session: ADL's and self-care;Proper use of Adaptive equipment and DME;Strengthening/ROM       AM-PAC PT "6 Clicks" Mobility  Outcome Measure Help needed turning from your back to your side while in a flat bed without using bedrails?: A Little Help needed moving from lying on your back to sitting on the side of a flat bed without using bedrails?: Total Help needed moving to and from a bed to a chair (including a wheelchair)?: Total Help needed standing up from a chair using your arms (e.g., wheelchair or bedside chair)?: Total Help needed to walk in hospital room?: Total Help needed climbing 3-5 steps with a railing? : Total 6 Click Score: 8    End of Session Equipment Utilized During Treatment: Gait belt Activity Tolerance: Patient tolerated treatment well Patient left: in bed;with call  bell/phone within reach;with bed alarm set Nurse Communication: Mobility status PT Visit Diagnosis: Unsteadiness on feet (R26.81);Other abnormalities  of gait and mobility (R26.89);Muscle weakness (generalized) (M62.81);Difficulty in walking, not elsewhere classified (R26.2);History of falling (Z91.81);Other symptoms and signs involving the nervous system (A63.016)    Time: 0109-3235 PT Time Calculation (min) (ACUTE ONLY): 21 min   Charges:   PT Evaluation $PT Eval Moderate Complexity: 1 Mod   PT General Charges $$ ACUTE PT VISIT: 1 Visit         Harrol Novello B. Beverely Risen PT, DPT Acute Rehabilitation Services Please use secure chat or  Call Office [phone]   Elon Alas [facility] 07/19/2023, 11:07 AM

## 2023-07-19 NOTE — Procedures (Signed)
Patient Name: Ernest Campbell  MRN: [id number]  Epilepsy Attending: Charlsie Quest  Referring Physician/Provider: Caryl Pina, MD  Date: 07/19/2023 Duration: 23.34 mins  Patient history: 72 yo M presented on a bad headache that started the morning he presented to the ED, in conjunction with blurred vision, diplopia and acute confusion. EEG to evaluate for seizure  Level of alertness: Awake  AEDs during EEG study: None  Technical aspects: This EEG study was done with scalp electrodes positioned according to the 10-20 International system of electrode placement. Electrical activity was reviewed with band pass filter of 1-70Hz , sensitivity of 7 uV/mm, display speed of 46mm/sec with a 60Hz  notched filter applied as appropriate. EEG data were recorded continuously and digitally stored.  Video monitoring was available and reviewed as appropriate.  Description: The posterior dominant rhythm consists of 7 Hz activity of moderate voltage (25-35 uV) seen predominantly in posterior head regions, symmetric and reactive to eye opening and eye closing. EEG showed continuous generalized 5 to 7 Hz theta slowing. Hyperventilation and photic stimulation were not performed.     ABNORMALITY - Continuous slow, generalized  IMPRESSION: This study is suggestive of mild diffuse encephalopathy. No seizures or epileptiform discharges were seen throughout the recording.  [name] Annabelle Harman

## 2023-07-19 NOTE — ED Notes (Signed)
Nurse attempted to perform swallow screening, pt declined. Pt spouse attempted as well and he declined.

## 2023-07-19 NOTE — ED Notes (Signed)
[facility] contacted 

## 2023-07-19 NOTE — Progress Notes (Signed)
Carotid artery duplex has been completed. Preliminary results can be found in CV Proc through chart review.   07/19/23 1:41 PM Olen Cordial RVT

## 2023-07-19 NOTE — Plan of Care (Signed)

## 2023-07-19 NOTE — Evaluation (Signed)
Occupational Therapy Evaluation Patient Details Name: Ernest Campbell MRN: [id number] DOB: Jun 24, 1951 Today's Date: 07/19/2023   History of Present Illness 72 y.o. male Presents to ED 12/13 with complaints of headache, and double vision. 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  He has made a full recovery and was active and alert and back to baseline.   Clinical Impression   Pt admitted with R PCA stroke. Pt currently with functional limitations due to the deficits listed below (see OT Problem List). Prior to admit, pt was living with his wife independent with all ADL tasks and functional mobility including driving. Wife provided background information as pt was unable to. Patient will benefit from intensive inpatient follow up therapy, >3 hours/day. Pt will benefit from acute skilled OT to increase their safety and independence with ADL and functional mobility for ADL to facilitate discharge. OT will continue to follow patient acutely.         If plan is discharge home, recommend the following: Two people to help with walking and/or transfers;Two people to help with bathing/dressing/bathroom;Assistance with feeding;Help with stairs or ramp for entrance    Functional Status Assessment  Patient has had a recent decline in their functional status and demonstrates the ability to make significant improvements in function in a reasonable and predictable amount of time.  Equipment Recommendations  Other (comment) (defer to next venue of care)    Recommendations for Other Services Rehab consult     Precautions / Restrictions Precautions Precautions: Fall Restrictions Weight Bearing Restrictions Per Provider Order: No      Mobility Bed Mobility    General bed mobility comments: mod physical Ax2, maximal cuing, and hand over hand placement of  hand on bed rail. Pt did scoot hips towards EOB while seated when provided VC    Transfers Overall transfer level: Needs assistance Equipment used: None, 2 person hand held assist Transfers: Sit to/from Stand, Bed to chair/wheelchair/BSC Sit to Stand: Mod assist, +2 physical assistance, +2 safety/equipment, From elevated surface           General transfer comment: VC provided to iniitate sit to stand with 2 person assist. Pt with strong posterior bias. Unable to take steps forward toward window when cued. [place] steps taken when cued to side step towards HOB.      Balance Overall balance assessment: Needs assistance, History of Falls Sitting-balance support: Bilateral upper extremity supported, Feet supported Sitting balance-Leahy Scale: Zero Sitting balance - Comments: strong posterior bias. Able to intermittently maintain sitting balance with Min A. Posterior and anterior support used for safety. Postural control: Posterior lean Standing balance support: Bilateral upper extremity supported Standing balance-Leahy Scale: Poor Standing balance comment: standing EOB. Reliant on 2 person assist to maintain standing balance. Fluctuated between requiring max physical assist to Mod with 2 person.          ADL either performed or assessed with clinical judgement   ADL    General ADL Comments: At this time, pt requires 2 person assist for all BADL tasks for safety and cognition     Vision Baseline Vision/History: 1 Wears glasses (pt reports bifocals) Ability to See in Adequate Light: 0 Adequate Patient Visual Report:  (unable to assess) Vision Assessment?: Wears glasses for reading;Vision impaired- to be further tested in functional context Additional Comments: While seated on EOB, pt unable to visuallty  track. Pt looking at items and attempting to grab at items not there.     Perception Perception: Impaired Preception Impairment Details: Inattention/Neglect, Figure ground,  Spatial orientation Perception-Other Comments: severely impaired   Praxis Praxis: Impaired Praxis Impairment Details: Motor planning, Organization, Perseveration     Pertinent Vitals/Pain       Extremity/Trunk Assessment Upper Extremity Assessment Upper Extremity Assessment: Generalized weakness;Difficult to assess due to impaired cognition   Lower Extremity Assessment Lower Extremity Assessment: Difficult to assess due to impaired cognition   Cervical / Trunk Assessment Cervical / Trunk Assessment: Kyphotic   Communication Communication Communication: Difficulty following commands/understanding;Difficulty communicating thoughts/reduced clarity of speech Following commands: Follows one step commands inconsistently Cueing Techniques: Verbal cues;Gestural cues;Tactile cues;Visual cues   Cognition Arousal: Alert    General Comments  VSS on RA            Home Living Family/patient expects to be discharged to:: Private residence Living Arrangements: Spouse/significant other Available Help at Discharge: Family;Available 24 hours/day Type of Home: House Home Access: Stairs to enter [employer] of Steps: 6 Entrance Stairs-Rails: None Home Layout: Two level;Able to live on main level with bedroom/bathroom     Bathroom Shower/Tub: Walk-in shower;Tub/shower unit   Bathroom Toilet: Standard Bathroom Accessibility: Yes   Home Equipment: Cane - single point;Rollator (4 wheels);Rolling Walker (2 wheels);Grab bars - tub/shower          Prior Functioning/Environment Prior Level of Function : Independent/Modified Independent;Driving             Mobility Comments: Was not using any AD at baseline ADLs Comments: Wife manages finances        OT Problem List: Decreased strength;Decreased activity tolerance;Impaired balance (sitting and/or standing);Decreased coordination;Decreased cognition;Decreased safety awareness;Decreased knowledge of use of DME or  AE;Decreased knowledge of precautions      OT Treatment/Interventions: Self-care/ADL training;Therapeutic exercise;Energy conservation;Neuromuscular education;DME and/or AE instruction;Therapeutic activities;Cognitive remediation/compensation;Visual/perceptual remediation/compensation;Patient/family education;Balance training    OT Goals(Current goals can be found in the care plan section) Acute Rehab OT Goals Patient Stated Goal: None stated OT Goal Formulation: Patient unable to participate in goal setting Time For Goal Achievement: 08/02/23 Potential to Achieve Goals: Good  OT Frequency: Min 1X/week    Co-evaluation PT/OT/SLP Co-Evaluation/Treatment: Yes Reason for Co-Treatment: To address functional/ADL transfers PT goals addressed during session: Mobility/safety with mobility OT goals addressed during session: ADL's and self-care;Proper use of Adaptive equipment and DME;Strengthening/ROM      AM-PAC OT "6 Clicks" Daily Activity     Outcome Measure Help from another person eating meals?: Total Help from another person taking care of personal grooming?: Total Help from another person toileting, which includes using toliet, bedpan, or urinal?: Total Help from another person bathing (including washing, rinsing, drying)?: Total Help from another person to put on and taking off regular upper body clothing?: Total Help from another person to put on and taking off regular lower body clothing?: Total 6 Click Score: 6   End of Session Equipment Utilized During Treatment: Gait belt Nurse Communication: Mobility status  Activity Tolerance: Other (comment) (treatment limited secondary to cognition) Patient left: in bed;with call bell/phone within reach;with bed alarm set  OT Visit Diagnosis: Unsteadiness on feet (R26.81);Muscle weakness (generalized) (M62.81);History of falling (Z91.81)                Time: 0093-8182 OT Time Calculation (min): 22 min Charges:  OT General Charges $OT  Visit: 1 Visit OT Evaluation $OT Eval Moderate Complexity: 1 Mod  Tunisha Ruland, OTR/L,CBIS  Supplemental OT - [facility] and [facility] Secure Chat Preferred    Lecretia Buczek, Charisse March 07/19/2023, 10:54 AM

## 2023-07-19 NOTE — Progress Notes (Signed)
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  He has made a full recovery from those injuries and was active and alert and back to baseline.Marland Kitchen He iwoke up with a severe headache, does have a history of cluster headaches so it seemed not out of the ordinary.  They were able to get rid of the headache but around 1130 the patient started to have a headache again which is very unusual.  He also complained of some blurry vision and later double vision.  The wife said she heard some banging and went to the bathroom and the patient was in the bathroom and could not figure out how to get out of the bathroom.  None of this behavior is baseline.  He usually has some coffee while sitting in the recliner.  She found him laying on the floor on the table in front of the recliner. The patient said he was not able to see the table.  He also needed help getting dressed he could not figure out how to put his shirt on himself.  None of that is baseline.  The patient's wife called the doctor and was instructed to bring him to the ER right away.  The patient never had any difficulty speaking or eating or swallowing or walking.  He walked independently into the ER.  He it was the headache and double vision and the behavior that was her primary concern. In the ED: Vitals fairly stable afebrile Unremarkable labs CT scan of the head which was unremarkable. MRI which revealed an acute nonhemorrhagic right PCA stroke. Neurology was consulted and admitted for further management Later in the emergency department the patient developed another headache.  He did receive Benadryl and fentanyl and Reglan.  And later received Ativan but the patient got very agitated and more confused  after these medications.  He was not able to lay still to have the CTA that was ordered.     Subjective: Seen this am  ife at bedside  LAst night was thrashing was bad per wife Now mo realert awake no mr oeheadache but bit confused speaks low and answeriing questions intermittnetly Later patient was getting agitated  Assessment and Plan: Principal Problem:   CVA (cerebral vascular accident) (HCC)   Acute right PCA stroke: Neurology consulted, complete stroke workup PT OT speech eval as below.  LDL 128 goal less than 70 F6E 5.8 goal less than 7. BP goal : Permissive HTN upto 220/110 mmHg  fu Echocardiogram. Prophylactic therapy- pending neuro recs PT consult, OT consult, Speech consult Telemetry monitoring. Frequent neuro checks   Acute encephalopathy: ?Etiology. ? due to Ativan. Keep on Delirium precaution supportive care fall precaution ?LP- empiric Abx for ?meningitis ordered per neuro.  History of TBI Cluster headache: Continue home Sinemet, pain management, Aricept  DVT prophylaxis: enoxaparin (LOVENOX) injection 40 mg Start: 07/19/23 2016 Code Status:   Code Status: Full Code Family Communication: plan of care discussed with patient/wife at bedside. Patient status is: Remains hospitalized because of severity of illness Level of care: Progressive   Dispo: The patient is from: home            Anticipated disposition: TBD Objective: Vitals last 24 hrs: Vitals:   07/19/23 0000 07/19/23 3329 07/19/23 0421 07/19/23 0757  BP:  114/63 107/64 (!) 118/59  Pulse:  85 78 75  Resp:  16 18 17   Temp: 98.8 F (37.1 C) 98.6 F (37 C) 98.1 F (36.7 C) 98.5 F (36.9 C)  TempSrc: Axillary Axillary Oral Oral  SpO2:  99% 100% 98%  Weight:      Height:       Weight change:   Physical Examination: General exam: alert awake, intermittently answers questions, mildly agitated at baseline, older than stated age HEENT:Oral mucosa moist, Ear/Nose WNL grossly Respiratory system:  Bilaterally clear BS,no use of accessory muscle Cardiovascular system: S1 & S2 +, No JVD. Gastrointestinal system: Abdomen soft,NT,ND, BS+ Nervous System: Alert, awake, moving all extremities,and following commands. Extremities: LE edema neg,distal peripheral pulses palpable and warm.  Skin: No rashes,no icterus. MSK: Normal muscle bulk,tone, power   Medications reviewed:  Scheduled Meds:  [START ON 07/20/2023]  stroke: early stages of recovery book   Does not apply Once   carbidopa-levodopa  1 tablet Oral TID WC   donepezil  5 mg Oral QHS   enoxaparin (LOVENOX) injection  40 mg Subcutaneous Q24H   Continuous Infusions:    Diet Order             Diet NPO time specified  Diet effective now                           No intake or output data in the 24 hours ending 07/19/23 0839 Net IO Since Admission: No IO data has been entered for this period [07/19/23 0839]  Wt Readings from Last 3 Encounters:  07/18/23 63.5 kg  01/31/23 63.7 kg  02/01/22 64.6 kg     Unresulted Labs (From admission, onward)     Start     Ordered   07/19/23 0017  HIV Antibody (routine testing w rflx)  (HIV Antibody (Routine testing w reflex) panel)  Once,   R        07/19/23 0019          Data Reviewed: I have personally reviewed following labs and imaging studies CBC: Recent Labs  Lab 07/18/23 1523 07/18/23 1528  WBC 9.9  --   NEUTROABS 6.8  --   HGB 14.1 14.3  HCT 41.1 42.0  MCV 97.2  --   PLT 239  --    Basic Metabolic Panel:  Recent Labs  Lab 07/18/23 1523 07/18/23 1528  NA 137 138  K 4.0 3.9  CL 103 102  CO2 26  --   GLUCOSE 96 95  BUN 21 20  CREATININE 0.93 1.10  CALCIUM 9.5  --    GFR: Estimated Creatinine Clearance: 56.1 mL/min (by C-G formula based on SCr of 1.1 mg/dL). Liver Function Tests:  Recent Labs  Lab 07/18/23 1523  AST 16  ALT 6  ALKPHOS 61  BILITOT 0.8  PROT 6.6  ALBUMIN 4.2   No results for input(s): "LIPASE", "AMYLASE" in the last 168 hours.  No results for input(s): "AMMONIA" in the last 168 hours. Coagulation Profile:  Recent Labs  Lab 07/18/23 1523  INR 1.0   No results for input(s): "PROBNP" in the last 168 hours.  Recent Labs    07/19/23 0642  HGBA1C 5.8*   Recent Labs  Lab 07/18/23 1508 07/18/23 1519  GLUCAP 110* 110*   Recent Labs    07/19/23 0642  CHOL 189  HDL 51  LDLCALC 128*  TRIG 48  CHOLHDL 3.7   No  results for input(s): "TSH", "T4TOTAL", "FREET4", "T3FREE", "THYROIDAB" in the last 72 hours. Sepsis Labs: No results for input(s): "PROCALCITON", "LATICACIDVEN" in the last 168 hours. No results found for this or any previous visit (from the past 240 hours).  Antimicrobials/Microbiology: Anti-infectives (From admission, onward)    None         Component Value Date/Time   SDES CSF 07/13/2021 1505   SPECREQUEST NONE 07/13/2021 1505   CULT  07/13/2021 1505    NO GROWTH 3 DAYS Performed at [facility], [address], [place], [place] [address]    REPTSTATUS 07/17/2021 FINAL 07/13/2021 1505     Radiology Studies: MR Brain Wo Contrast (neuro protocol) Result Date: 07/18/2023 CLINICAL DATA:  Initial evaluation for acute neuro deficit, stroke suspected. EXAM: MRI HEAD WITHOUT CONTRAST TECHNIQUE: Multiplanar, multiecho pulse sequences of the brain and surrounding structures were obtained without intravenous contrast. COMPARISON:  Prior CT from earlier the same day. FINDINGS: Brain: Examination mildly limited due to motion artifact. Cerebral volume within normal limits. Patchy T2/FLAIR hyperintensity seen involving the periventricular deep white matter both cerebral hemispheres, consistent with chronic small vessel ischemic disease, mild to moderate in nature. Punctate 4 mm focus of restricted diffusion seen involving the right occipital pole, consistent with a small acute ischemic infarct, right PCA distribution (series 5, image 12). No associated hemorrhage or mass effect. No other evidence  for acute or subacute ischemia. Gray-white matter differentiation otherwise maintained. No areas of chronic cortical infarction. No acute or chronic intracranial blood products. No mass lesion, midline shift or mass effect. No hydrocephalus or extra-axial fluid collection. Pituitary gland and suprasellar region within normal limits. Vascular: Major intracranial vascular flow voids are maintained. Skull and upper cervical spine: Craniocervical junction within normal limits. Bone marrow signal intensity normal. No scalp soft tissue abnormality. Sinuses/Orbits: Susceptibility artifact partially obscures the right globe. Visualized globes and orbits grossly within normal limits. Chronic right maxillary sinusitis. No mastoid effusion. Other: None. IMPRESSION: 1. Punctate 4 mm acute ischemic nonhemorrhagic right PCA distribution infarct involving the right occipital pole. 2. Underlying mild to moderate chronic microvascular ischemic disease. 3. Chronic right maxillary sinusitis. Electronically Signed   By: Rise Mu M.D.   On: 07/18/2023 19:34   DG Chest 2 View Result Date: 07/18/2023 CLINICAL DATA:  Chest pain. EXAM: CHEST - 2 VIEW COMPARISON:  None Available. FINDINGS: Clear lungs. Normal heart size and mediastinal contours. No pleural effusion or pneumothorax. Visualized bones and upper abdomen are unremarkable. IMPRESSION: No evidence of acute cardiopulmonary disease. Electronically Signed   By: Orvan Falconer M.D.   On: 07/18/2023 16:15   CT HEAD WO CONTRAST Result Date: 07/18/2023 CLINICAL DATA:  Headache, neuro deficit. EXAM: CT HEAD WITHOUT CONTRAST TECHNIQUE: Contiguous axial images were obtained from the base of the skull through the vertex without intravenous contrast. RADIATION DOSE REDUCTION: This exam was performed according to the departmental dose-optimization program which includes automated exposure control, adjustment of the mA and/or kV according to patient size and/or use of  iterative reconstruction technique. COMPARISON:  Head CT 04/25/2021.  MRI brain 07/13/2021. FINDINGS: Brain: No acute hemorrhage. Unchanged mild chronic small-vessel disease. Cortical gray-white differentiation is otherwise preserved. Prominence of the ventricles and sulci within expected range for age. No hydrocephalus or extra-axial collection. No mass effect or midline shift. Vascular: No hyperdense vessel or unexpected calcification. Skull: No calvarial fracture or suspicious bone lesion. Skull base is unremarkable. Sinuses/Orbits: Right maxillary sinus atelectasis. Orbits are unremarkable. Other: None. IMPRESSION: 1. No acute  intracranial abnormality. 2. Unchanged mild chronic small-vessel disease. 3. Right maxillary sinus atelectasis. Electronically Signed   By: Orvan Falconer M.D.   On: 07/18/2023 16:14     LOS: 1 day   Total time spent in review of labs and imaging, patient evaluation, formulation of plan, documentation and communication with family: 50 minutes  Lanae Boast, MD [facility]  07/19/2023, 8:39 AM

## 2023-07-19 NOTE — Consult Note (Signed)
NEUROLOGY CONSULT NOTE   Date of service: July 19, 2023 Patient Name: Ernest Campbell MRN:  [id number] DOB:  January 08, 1951 Chief Complaint: Headache, double vision, blurred vision and AMS Requesting Provider: Lanae Boast, MD  History of Present Illness  Ernest Campbell is a 72 y.o. male with a PMHx of Parkinson's disease on Sinemet, mild cognitive impairment, TBI and brain bleed s/p fall in 2022 which led to 180 days of severe cognitive and physical disability per the wife's report, cluster headaches, back pain with bilateral S1/L5 radiculopathy (diagnosed at [facility] with electrophysiological and clinical assessment), prior epidural injection through [facility] Dr. Merilyn Baba and foot drop, last seen by outpatient Neurologist Dr. Vickey Huger on 02/01/22, who presented on Thursday afternoon with intermittent chest pain x 2 weeks as well as a bad headache that started the morning he presented to the ED. Wife felt that he was having trouble doing normal tasks at home. The wife said she heard some banging and went to the bathroom and the patient was in the bathroom and could not figure out how to get out of the bathroom. None of this behavior is baseline. He usually has some coffee while sitting in the recliner. She found him laying on the floor on the table in front of the recliner. The patient said he was not able to see the table. He also needed help getting dressed he could not figure out how to put his shirt on himself. None of that is baseline. The patient's wife called the doctor and was instructed to bring him to the ER right away. He endorsed blurred and double vision in both eyes on initial assessment. The patient had endorsed that prior episodes of cluster headache had been associated with diplopia as well. His headache was described as bitemporal.   MRI was obtained for his headache, which revealed a punctate 4 mm acute ischemic nonhemorrhagic right PCA infarct in the occipital lobe.   He required  sedation with Ativan, Benadryl and fentanyl yesterday for MRI. Since then he has had worsened mental status. His mentation had deteriorated yesterday night to the extent that he was not able to follow commands for taking his oral temperature. He was afebrile by axillary temp and does not have a white count.   Prior MRI brain: "Motion degraded examination, as described and limiting evaluation.  No evidence of acute intracranial abnormality.  Moderate chronic small vessel ischemic changes within the cerebral  white matter, not appreciably changed from the brain MRI of 05/18/2021.   Severe right maxillary sinusitis."   Prior LP/CSF: "Mildly elevated protein, normal glucose in CSF and only 3 cells- normal   (not NPH by low opening pressure of 10 cm water )" "Normal opening pressure, 28 ml were removed CSF testing is documented separately." "Slight elevation of protein in CSF , no cells- need RV for rule out of GBS or CIDP"  Of note, he has had visual hallucinations in the past, that Dr. Vickey Huger felt were very likely due to Lewy body disease based on the description, in conjunction with his Parkinsonism. Last MMSE at her office was improved to 26/30 from 22/30 on a prior visit. 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 She did not feel that he had NPH at his last visit to her office in June of 2023.     ROS  Unable to obtain due to AMS.   Past  History    History reviewed. No pertinent surgical history.  Family History: Family History  Problem Relation Age of Onset   Dementia Mother    Parkinsonism Father    Breast cancer Sister     Social History  reports that he has been smoking cigarettes. He does not have any smokeless tobacco history on file. He reports that he does not drink alcohol and does not use drugs.  Allergies  Allergen Reactions   Other Other (See Comments)     According to the patient, all opioids cause hallucinations   Prednisone Other (See Comments)    Hallucinations    Acetaminophen     Hallucinations   Codeine Other (See Comments)    Hallucinations   Morphine Other (See Comments)    Hallucinations   Oxycodone Other (See Comments)    Hallucinations    Medications   Current Facility-Administered Medications:    [START ON 07/20/2023]  stroke: early stages of recovery book, , Does not apply, Once, Buena Irish, MD   carbidopa-levodopa (SINEMET IR) 25-100 MG per tablet immediate release 1 tablet, 1 tablet, Oral, TID WC, Buena Irish, MD   donepezil (ARICEPT) tablet 5 mg, 5 mg, Oral, QHS, Buena Irish, MD   enoxaparin (LOVENOX) injection 40 mg, 40 mg, Subcutaneous, Q24H, Buena Irish, MD   Oral care mouth rinse, 15 mL, Mouth Rinse, PRN, Buena Irish, MD   senna-docusate (Senokot-S) tablet 1 tablet, 1 tablet, Oral, QHS PRN, Buena Irish, MD  Vitals   Vitals:   07/18/23 2315 07/19/23 0000 07/19/23 0204 07/19/23 0421  BP: 92/67  114/63 107/64  Pulse: 88  85 78  Resp:   16 18  Temp:  98.8 F (37.1 C) 98.6 F (37 C) 98.1 F (36.7 C)  TempSrc:  Axillary Axillary Oral  SpO2: 100%  99% 100%  Weight:      Height:        Body mass index is 21.93 kg/m.  Physical Exam   Physical Exam  HEENT-  [place]/AT. No neck stiffness with lateral rotation of the head/neck to left and right. Mild resistance when flexing the neck.     Lungs- Respirations unlabored Extremities- No edema  Neurological Examination Mental Status: Lethargic and drowsy. Initially was sleeping with sonorous respirations. On arousal he was somnolent, frequently falling back asleep without stimulation, then drowsy and able to stay awake later in the exam. Speech is sparse and hypophonic, consisting only of 1-3 word answers to questions. He is only oriented to the city and state, not being able to recall the day of the week, year or month. He is  not able to state why he is in the hospital. Has difficulty following several commands. Able to name a pen and a penlight. Little to no eye contact with his gaze having a staring quality.  Cranial Nerves: II:  PERRL No consistent responses when testing blink to threat. Unable to cooperate with formal visual field testing. Able to visually identify two objects, but was unable to target objects visually with either hand when asking him to grasp a sock and when attempting to test FNF. Does not appear to fixate on any objects held in front of him and exhibited searching-like movements of his hands when attempting to test FNF.  III,IV, VI: No ptosis. EOMI when asked to look to the left and right, but was not able to follow commands for assessment of visual tracking. No nystagmus. .  V: Reacts to touch bilaterally  VII: Smile symmetric VIII:  HOH IX,X: Prominently hypophonic XI: Head is midline XII: Midline tongue extension; required multiple requests for him to comprehend command Motor: BUE 5/5 proximally and distally with bradykinesia and mild rigidity BLE 5/5 proximally and distally with bradykinesia and mild rigidity Sensory: Reacts to touch x 4 Deep Tendon Reflexes: 1+ and symmetric throughout Cerebellar: No gross ataxia. 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  Gait: Deferred   Labs/Imaging/Neurodiagnostic studies   CBC:  Recent Labs  Lab August 15, 2023 1523 08-15-23 1528  WBC 9.9  --   NEUTROABS 6.8  --   HGB 14.1 14.3  HCT 41.1 42.0  MCV 97.2  --   PLT 239  --    Basic Metabolic Panel:  Lab Results  Component Value Date   NA 138 15-Aug-2023   K 3.9 15-Aug-2023   CO2 26 08/15/2023   GLUCOSE 95 15-Aug-2023   BUN 20 Aug 15, 2023   CREATININE 1.10 2023-08-15   CALCIUM 9.5 2023/08/15   GFRNONAA >60 08/15/23   Lipid Panel: No results found for: "LDLCALC" HgbA1c: No results found for: "HGBA1C" Urine Drug Screen: No results found for: "LABOPIA",  "COCAINSCRNUR", "LABBENZ", "AMPHETMU", "THCU", "LABBARB"  Alcohol Level     Component Value Date/Time   ETH <10 08-15-2023 1523   INR  Lab Results  Component Value Date   INR 1.0 08-15-2023   APTT  Lab Results  Component Value Date   APTT 34 08-15-23   AED levels: No results found for: "PHENYTOIN", "ZONISAMIDE", "LAMOTRIGINE", "LEVETIRACETA"    ASSESSMENT  72 y.o. male with a PMHx of Parkinson's disease on Sinemet, mild cognitive impairment, TBI and brain bleed s/p fall in 2022 which led to 180 days of severe cognitive and physical disability per the wife's report, possible Lewy body disease per prior outpatient neurology assessment in 2023, cluster headaches, back pain with bilateral S1/L5 radiculopathy (diagnosed at [facility] with electrophysiological and clinical assessment), prior epidural injection through [facility] Dr. Merilyn Baba and foot drop, last seen by outpatient Neurologist Dr. Vickey Huger on 02/01/22, who presented on a bad headache that started the morning he presented to the ED, in conjunction with blurred vision, diplopia and acute confusion.  - Exam findings reveal an encephalopathic patient with poor orientation, sparse speech output, difficulty following commands, optic ataxia and findings suggestive of cortical blindness.  - MRI brain: Punctate 4 mm acute ischemic nonhemorrhagic right PCA distribution infarct involving the right occipital pole is only visible on axial DWI, with no ADC correlate and no corresponding hyperintensity on coronal DWI. Underlying mild to moderate chronic microvascular ischemic disease. Chronic right maxillary sinusitis. - CMP on presentation was normal. Troponin < 2. WBC normal. Glucose 110. EtOH < 10.  - Impressions:  - DDx for his cognitive presentation includes Lewy body dementia with severe acute cognitive fluctuation, a bizarre presentation of subclinical status epilepticus, infection and acute worsening of mentation secondary to the  cocktail of sedating meds (fentanyl, Benadryl and Ativan) that he received yesterday for MRI. The punctate right occipital lobe DWI hyperintensity that was seen on MRI does not explain his presentation.  - Meningitis/encephalitis is also on the DDx given acute headache, AMS and mild neck stiffness in flexion. However, his neck is supple in rotation, he has no white count and he is afebrile - DDx for his acute visual impairment includes a bizarre presentation of temporal arteritis versus lethargy/AMS masquerading as cortical blindness - He has baseline decreased neurological reserve given his known chronic cognitive impairment.  - PRES would  be a consideration given his visual impairment on exam, but MRI shows no evidence for this and he is not hypertensive. His apparent visual impairment with ability to answer correctly to some visual identification questions may also be explained by severe delirium.    RECOMMENDATIONS  - ESR and CRP (ordered) - EEG (ordered) - Frequent neuro checks - Empiric meningitis-dose antibiotics: Ampicillin, acyclovir, vancomycin and ceftriaxone. Pharmacy has been consulted.  - Uncertain if an LP will be able to be obtained without fluoro guidance under sedation as the patient is uncooperative due to his AMS, frequently moving about in his bed.  - Stroke work up to include cardiac telemetry, CTA of head and neck and TTE - Continue Sinemet and Aricept - ASA 81 mg po every day.  - BP management per standard protocol. He is out of the permissive HTN time window.  - PT/OT/Speech  ______________________________________________________________________    Ernest Campbell, Ernest Avakian, MD [facility]

## 2023-07-19 NOTE — Evaluation (Signed)
Clinical/Bedside Swallow Evaluation Patient Details  Name: Ernest Campbell MRN: [id number] Date of Birth: October 31, 1950  Today's Date: 07/19/2023 Time: SLP Start Time (ACUTE ONLY): 1052 SLP Stop Time (ACUTE ONLY): 1102 SLP Time Calculation (min) (ACUTE ONLY): 10 min  Past Medical History: History reviewed. No pertinent past medical history. Past Surgical History: History reviewed. No pertinent surgical history. HPI:  72 y.o. male presents to ED 12/13 with complaints of headache and double vision. 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  He has made a full recovery and was active and alert and back to baseline.    Assessment / Plan / Recommendation  Clinical Impression  Pt's oropharyngeal swallow appears to be [facility], although there is some elevated risk for inadequate intake and/or aspiration in light of current mentation. During this evaluation he was not very attentive to POs as he was actively trying to get OOB for most of it. Discussed with nurse that I do think he is capable of PO intake, but how much he takes in will depend on how his cognition progresses. Suspect that alternative feeding may only further increase confusion and current behaviors, so would try feeding him PO as much as possible. Will start with Dys 3 solids and thin liquids with careful assistance from staff during meals.  SLP Visit Diagnosis: Dysphagia, unspecified (R13.10)    Aspiration Risk  Mild aspiration risk;Moderate aspiration risk;Risk for inadequate nutrition/hydration    Diet Recommendation Dysphagia 3 (Mech soft);Thin liquid    Liquid Administration via: Cup;Straw Medication Administration: Whole meds with liquid Supervision: Full supervision/cueing for  compensatory strategies Compensations: Minimize environmental distractions;Slow rate;Small sips/bites Postural Changes: Seated upright at 90 degrees    Other  Recommendations Oral Care Recommendations: Oral care BID    Recommendations for follow up therapy are one component of a multi-disciplinary discharge planning process, led by the attending physician.  Recommendations may be updated based on patient status, additional functional criteria and insurance authorization.  Follow up Recommendations Acute inpatient rehab (3hours/day)      Assistance Recommended at Discharge    Functional Status Assessment Patient has had a recent decline in their functional status and demonstrates the ability to make significant improvements in function in a reasonable and predictable amount of time.  Frequency and Duration min 2x/week  2 weeks       Prognosis Prognosis for improved oropharyngeal function: Good Barriers to Reach Goals: Cognitive deficits      Swallow Study   General HPI: 72 y.o. male presents to ED 12/13 with complaints of headache and double vision. 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  He has made a full recovery and was active and alert and back to baseline. Type of Study: Bedside Swallow Evaluation Previous Swallow Assessment: none in chart Diet Prior to this Study: NPO Temperature Spikes Noted: No Respiratory Status: Room air History of Recent Intubation: No Behavior/Cognition: Alert;Confused;Agitated Oral Care Completed by SLP: No Oral Cavity - Dentition: Adequate natural dentition Self-Feeding Abilities: Total assist Patient Positioning: Upright in bed Baseline Vocal  Quality: Normal    Oral/Motor/Sensory  Function     Ice Chips Ice chips: Not tested   Thin Liquid Thin Liquid: Within functional limits Presentation: Straw    Nectar Thick Nectar Thick Liquid: Not tested   Honey Thick Honey Thick Liquid: Not tested   Puree Puree: Within functional limits Presentation: Spoon   Solid     Solid: Within functional limits      Mahala Menghini., M.A. CCC-SLP Acute Rehabilitation Services Office [phone]  Secure chat preferred  07/19/2023,11:28 AM

## 2023-07-20 ENCOUNTER — Inpatient Hospital Stay (HOSPITAL_COMMUNITY): Payer: Medicare Other

## 2023-07-20 DIAGNOSIS — I639 Cerebral infarction, unspecified: Secondary | ICD-10-CM | POA: Diagnosis not present

## 2023-07-20 DIAGNOSIS — R4182 Altered mental status, unspecified: Secondary | ICD-10-CM | POA: Diagnosis not present

## 2023-07-20 LAB — COMPREHENSIVE METABOLIC PANEL
ALT: 17 U/L (ref 0–44)
AST: 20 U/L (ref 15–41)
Albumin: 3 g/dL — ABNORMAL LOW (ref 3.5–5.0)
Alkaline Phosphatase: 49 U/L (ref 38–126)
Anion gap: 9 (ref 5–15)
BUN: 10 mg/dL (ref 8–23)
CO2: 23 mmol/L (ref 22–32)
Calcium: 8.6 mg/dL — ABNORMAL LOW (ref 8.9–10.3)
Chloride: 107 mmol/L (ref 98–111)
Creatinine, Ser: 1.12 mg/dL (ref 0.61–1.24)
GFR, Estimated: >60 mL/min (ref >60)
Glucose, Bld: 85 mg/dL (ref 70–99)
Potassium: 4.2 mmol/L (ref 3.5–5.1)
Sodium: 139 mmol/L (ref 135–145)
Total Bilirubin: 1.3 mg/dL — ABNORMAL HIGH (ref <1.2)
Total Protein: 5.3 g/dL — ABNORMAL LOW (ref 6.5–8.1)

## 2023-07-20 LAB — CBC
HCT: 38 % — ABNORMAL LOW (ref 39.0–52.0)
Hemoglobin: 13.1 g/dL (ref 13.0–17.0)
MCH: 32.5 pg (ref 26.0–34.0)
MCHC: 34.5 g/dL (ref 30.0–36.0)
MCV: 94.3 fL (ref 80.0–100.0)
Platelets: 216 10*3/uL (ref 150–400)
RBC: 4.03 MIL/uL — ABNORMAL LOW (ref 4.22–5.81)
RDW: 12.8 % (ref 11.5–15.5)
WBC: 8.5 10*3/uL (ref 4.0–10.5)
nRBC: 0 % (ref 0.0–0.2)

## 2023-07-20 LAB — CSF CELL COUNT WITH DIFFERENTIAL
RBC Count, CSF: 3200 /mm3 — ABNORMAL HIGH
RBC Count, CSF: 660 /mm3 — ABNORMAL HIGH
Tube #: 3
Tube #: 1
WBC, CSF: 9 /mm3 — ABNORMAL HIGH (ref 0–5)
WBC, CSF: 3 /mm3 (ref 0–5)

## 2023-07-20 LAB — PROTEIN AND GLUCOSE, CSF
Glucose, CSF: 59 mg/dL (ref 40–70)
Total  Protein, CSF: 52 mg/dL — ABNORMAL HIGH (ref 15–45)

## 2023-07-20 MED ORDER — DEXTROSE 5 % IV SOLN
650.0000 mg | Freq: Three times a day (TID) | INTRAVENOUS | Status: DC
  Administered 2023-07-20 – 2023-07-21 (×2): 650 mg via INTRAVENOUS
  Filled 2023-07-20 (×4): qty 13

## 2023-07-20 MED ORDER — ATORVASTATIN CALCIUM 40 MG PO TABS
40.0000 mg | ORAL_TABLET | Freq: Every day | ORAL | Status: DC
  Administered 2023-07-20 – 2023-07-21 (×2): 40 mg via ORAL
  Filled 2023-07-20 (×2): qty 1

## 2023-07-20 MED ORDER — NICOTINE 14 MG/24HR TD PT24
14.0000 mg | MEDICATED_PATCH | Freq: Every day | TRANSDERMAL | Status: DC
  Administered 2023-07-20 – 2023-07-21 (×2): 14 mg via TRANSDERMAL
  Filled 2023-07-20 (×2): qty 1

## 2023-07-20 MED ORDER — LIDOCAINE 1 % OPTIME INJ - NO CHARGE
5.0000 mL | Freq: Once | INTRAMUSCULAR | Status: AC
  Administered 2023-07-20: 2 mL via INTRADERMAL

## 2023-07-20 NOTE — Progress Notes (Signed)
Physical Therapy Treatment Patient Details Name: Ernest Campbell MRN: [id number] DOB: 1950-12-14 Today's Date: 07/20/2023   History of Present Illness 72 y.o. male Presents to ED 12/13 with complaints of headache, and double vision. 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  He has made a full recovery and was active and alert and back to baseline.    PT Comments  Pt received in bed and with large BM of which is was unaware. Pt oriented only to self. Pt reports L shoulder pain with mvmt though later reports that is does not hurt, continues to grimace with elevation though. Pt needed mod A to come to EOB and maintains posterior bias throughout all mobility. Mod A +2 needed to transfer to [facility] and then ambulate 4' with recliner. Pt has difficulty sequencing steps and needs visual and tactile cues throughout. Visual deficits playing into these balance deficits as well. Pt positioned in recliner with posey alarm belt and wrist restraints. PT will continue to follow.     If plan is discharge home, recommend the following: A lot of help with walking and/or transfers;A lot of help with bathing/dressing/bathroom;Assistance with cooking/housework;Assistance with feeding;Direct supervision/assist for medications management;Direct supervision/assist for financial management;Assist for transportation;Help with stairs or ramp for entrance;Supervision due to cognitive status   Can travel by private vehicle        Equipment Recommendations  Wheelchair (measurements PT);Wheelchair cushion (measurements PT)    Recommendations for Other Services       Precautions / Restrictions Precautions Precautions: Fall Restrictions Weight Bearing Restrictions Per Provider Order: No     Mobility  Bed Mobility Overal bed mobility: Needs Assistance Bed Mobility: Supine to  Sit, Rolling Rolling: Min assist   Supine to sit: Mod assist     General bed mobility comments: min A to roll R and L for bowel cleanup, heavy use of rail. Mod A at LE's and trunk to come to sitting and max A needed to scoot hips to EOB. Posterior bias noted in sitting    Transfers Overall transfer level: Needs assistance Equipment used: 2 person hand held assist Transfers: Sit to/from Stand, Bed to chair/wheelchair/BSC Sit to Stand: Mod assist, +2 physical assistance, +2 safety/equipment, From elevated surface   Step pivot transfers: Mod assist, +2 physical assistance       General transfer comment: pt woth posterior lean throughout standing. Pivoted from bed to [facility]. Needed single step cues for stepping each foot and pt still struggled to move feet    Ambulation/Gait Ambulation/Gait assistance: Mod assist, +2 physical assistance Gait Distance (Feet): 4 Feet Assistive device: 2 person hand held assist Gait Pattern/deviations: Step-to pattern, Leaning posteriorly Gait velocity: decreased Gait velocity interpretation: <1.31 ft/sec, indicative of household ambulator   General Gait Details: stepped from [facility] to recliner. Visual cues needed ("Step towards my foot") to keep moving. Pt had difficulty stepping bkwd once at chair. Posterior lean maintained furing ambulation.   Stairs             Wheelchair Mobility     Tilt Bed    Modified Rankin (Stroke Patients Only) Modified Rankin (Stroke Patients Only) Pre-Morbid Rankin Score: No symptoms Modified Rankin: Severe disability     Balance Overall balance assessment: Needs assistance, History of Falls Sitting-balance support: Bilateral upper extremity supported, Feet supported Sitting balance-Leahy Scale: Zero Sitting balance - Comments: strong posterior  bias. Able to intermittently maintain sitting balance with Min A. Posterior and anterior support used for safety. Postural control: Posterior lean Standing balance  support: Bilateral upper extremity supported Standing balance-Leahy Scale: Poor Standing balance comment: standing EOB. Reliant on 2 person assist to maintain standing balance. Fluctuated between requiring max physical assist to Mod with 2 person.                            Cognition Arousal: Alert Behavior During Therapy: Restless, Flat affect, Impulsive Overall Cognitive Status: Impaired/Different from baseline Area of Impairment: Orientation, Attention, Following commands, Safety/judgement, Awareness, Problem solving                 Orientation Level: Disoriented to, Place, Time, Situation Current Attention Level: Sustained   Following Commands: Follows one step commands inconsistently, Follows multi-step commands inconsistently Safety/Judgement: Decreased awareness of safety, Decreased awareness of deficits Awareness: Intellectual Problem Solving: Slow processing, Decreased initiation, Difficulty sequencing, Requires verbal cues, Requires tactile cues General Comments: pt has difficulty sequencing and follows commands ~50% of time. R sided visual deficits playing into this.        Exercises      General Comments General comments (skin integrity, edema, etc.): VSS on RA. Pt seated in recliner with posey alarm belt and wrist restraints      Pertinent Vitals/Pain Pain Assessment Pain Assessment: Faces Faces Pain Scale: Hurts little more Pain Location: L shoudler with mvmt, though later denied that it hurt. Still winced when elevating though Pain Descriptors / Indicators: Grimacing Pain Intervention(s): Monitored during session, Repositioned    Home Living                          Prior Function            PT Goals (current goals can now be found in the care plan section) Acute Rehab PT Goals Patient Stated Goal: none stated PT Goal Formulation: Patient unable to participate in goal setting Time For Goal Achievement: 08/02/23 Potential to  Achieve Goals: Fair Progress towards PT goals: Progressing toward goals    Frequency    Min 1X/week      PT Plan      Co-evaluation              AM-PAC PT "6 Clicks" Mobility   Outcome Measure  Help needed turning from your back to your side while in a flat bed without using bedrails?: A Little Help needed moving from lying on your back to sitting on the side of a flat bed without using bedrails?: Total Help needed moving to and from a bed to a chair (including a wheelchair)?: Total Help needed standing up from a chair using your arms (e.g., wheelchair or bedside chair)?: Total Help needed to walk in hospital room?: Total Help needed climbing 3-5 steps with a railing? : Total 6 Click Score: 8    End of Session Equipment Utilized During Treatment: Gait belt Activity Tolerance: Patient tolerated treatment well Patient left: with call bell/phone within reach;in chair;with chair alarm set;with restraints reapplied;with nursing/sitter in room Nurse Communication: Mobility status PT Visit Diagnosis: Unsteadiness on feet (R26.81);Other abnormalities of gait and mobility (R26.89);Muscle weakness (generalized) (M62.81);Difficulty in walking, not elsewhere classified (R26.2);History of falling (Z91.81);Other symptoms and signs involving the nervous system (R29.898)     Time: 1610-9604 PT Time Calculation (min) (ACUTE ONLY): 40 min  Charges:    $Gait Training:  8-22 mins $Therapeutic Activity: 23-37 mins PT General Charges $$ ACUTE PT VISIT: 1 Visit                     Lyanne Co, PT  Acute Rehab Services Secure chat preferred Office [phone]    Lawana Chambers Stefen Juba 07/20/2023, 11:06 AM

## 2023-07-20 NOTE — Progress Notes (Signed)
Speech Language Pathology Treatment: Dysphagia  Patient Details Name: Ernest Campbell MRN: [id number] DOB: 11-19-1950 Today's Date: 07/20/2023 Time: 6644-0347 SLP Time Calculation (min) (ACUTE ONLY): 9 min  Assessment / Plan / Recommendation Clinical Impression  SLP followed up for dysphagia management. Pt upright in chair at bedside restrained and with spouse at bedside. Pt cooperative throughout sessions but remains confused overall. Upper dentures in place, lower dentures placed. Assessed with regular, thin liquids, and mixed consistency textures. Pt without overt s/sx of aspiration with any PO. Recommend diet upgrade to regular textures and thin liquids. Will follow dysphagia for additional meal observation with regular textures in setting of pt hx of PD, TBI, and most recent acute CVA with exacerbation of cognitive deficits.    HPI HPI: SELIG Campbell is a 72 y.o. male with a PMHx of Parkinson's disease on Sinemet, mild cognitive impairment, TBI and brain bleed s/p fall in 2022 which led to 180 days of severe cognitive and physical disability per the wife's report, cluster headaches, back pain with bilateral S1/L5 radiculopathy (diagnosed at [facility] with electrophysiological and clinical assessment), prior epidural injection through [facility] Ernest Campbell and foot drop, last seen by outpatient Neurologist Dr. Vickey Campbell on 02/01/22, who presented on Thursday afternoon with intermittent chest pain x 2 weeks as well as a bad headache that started the morning he presented to the ED. Wife felt that he was having trouble doing normal tasks at home.  MRI of head revealed  Punctate 4 mm acute ischemic nonhemorrhagic right PCA  distribution infarct involving the right occipital pole.      SLP Plan  Continue with current plan of care      Recommendations for follow up therapy are one component of a multi-disciplinary discharge planning process, led by the attending physician.  Recommendations may  be updated based on patient status, additional functional criteria and insurance authorization.    Recommendations  Diet recommendations: Regular;Thin liquid Liquids provided via: Cup;Straw Medication Administration: Whole meds with liquid Supervision: Patient able to self feed;Full supervision/cueing for compensatory strategies Compensations: Minimize environmental distractions;Slow rate;Small sips/bites Postural Changes and/or Swallow Maneuvers: Seated upright 90 degrees;Upright 30-60 min after meal                Rehab consult Oral care BID   Frequent or constant Supervision/Assistance Dysphagia, unspecified (R13.10)     Continue with current plan of care     Ernest Gal MA, CCC-SLP Acute Rehabilitation Services   07/20/2023, 11:39 AM

## 2023-07-20 NOTE — Progress Notes (Signed)
NEUROLOGY CONSULT FOLLOW UP NOTE   Date of service: July 20, 2023 Patient Name: Ernest Campbell:  [id number] DOB:  [birth year]  Brief HPI  Ernest Campbell is a 72 y.o. male who presented on 12/12 with headache, and mild confusion.   He was given fentanyl, Benadryl, lorazepam, Reglan. While in the emergency department, his confusion became markedly worse.   He was transferred to [facility] for neurology evaluation and MRI.  MRI revealed an incidental punctate area of diffusion abnormality, but on evaluation by neurology there was concern for neck stiffness.  He was therefore started on empiric meningitic antibiotic coverage.  He has a history of difficulty walking, but had marked improvement when started on Sinemet.  It was noted by Dr. Vickey Huger that he was complaining of visual hallucinations at night and sundowning.  There was concern for Lewy body dementia versus atypical parkinsonism.  He has not seen her since 2023.  MoCA in June 23 was 22.   Interval Hx/subjective   He is much better than he was yesterday. Vitals   Vitals:   07/20/23 0404 07/20/23 0733 07/20/23 0735 07/20/23 1122  BP: (!) 130/92 (!) 106/50  110/61  Pulse: 71 68  78  Resp:  20  20  Temp: 98 F (36.7 C)  98 F (36.7 C) 97.7 F (36.5 C)  TempSrc: Oral  Oral Oral  SpO2:  100%  100%  Weight:      Height:         Body mass index is 21.93 kg/m.  Physical Exam   General: In chair, NAD  Neurologic Examination   MS: Awake, alert, oriented to month, year, recommends his wife.  World backwards is "d-l-o-r-d" CN: EOMI, visual fields full Motor: Moves all extremities well Sensory: Intact to light touch  Labs and Diagnostic Imaging   CBC:  Recent Labs  Lab 07/18/23 1523 07/18/23 1528 07/20/23 0843  WBC 9.9  --  8.5  NEUTROABS 6.8  --   --   HGB 14.1 14.3 13.1  HCT 41.1 42.0 38.0*  MCV 97.2  --  94.3  PLT 239  --  216    Basic Metabolic Panel:  Lab Results  Component Value Date   NA 139  07/20/2023   K 4.2 07/20/2023   CO2 23 07/20/2023   GLUCOSE 85 07/20/2023   BUN 10 07/20/2023   CREATININE 1.12 07/20/2023   CALCIUM 8.6 (L) 07/20/2023   GFRNONAA >60 07/20/2023   Lipid Panel:  Lab Results  Component Value Date   LDLCALC 128 (H) 07/19/2023   HgbA1c:  Lab Results  Component Value Date   HGBA1C 5.8 (H) 07/19/2023   Urine Drug Screen: No results found for: "LABOPIA", "COCAINSCRNUR", "LABBENZ", "AMPHETMU", "THCU", "LABBARB"  Alcohol Level     Component Value Date/Time   ETH <10 07/18/2023 1523   INR  Lab Results  Component Value Date   INR 1.0 07/18/2023   APTT  Lab Results  Component Value Date   APTT 34 07/18/2023    Impression   [name] is a 72 y.o. male who presented with headache and confusion and has had marked improvement on antibiotics.  He was given several deliriogenic medications (Benadryl and fentanyl) and treatment of his headache and therefore I do think it is slightly unclear whether his worsening mental status in the [facility], ED was true worsening versus medication effect.  In any case, however, given that he presented with headache and has improved in the  setting of antibiotics, I do think a lumbar puncture continues to be indicated as it will guide antibiotic treatment.  Recommendations  LP for cells, protein, glucose, cultures Continue antibiotics pending LP Continue home Sinemet Avoid neuroleptics including Reglan Neurology will follow ______________________________________________________________________   Thank you for the opportunity to take part in the care of this patient. If you have any further questions, please contact the neurology consultation team on call. Updated oncall schedule is listed on AMION.  Signed,  Ritta Slot, MD [facility] [phone]  If 7pm- 7am, please page neurology on call as listed in AMION.

## 2023-07-20 NOTE — Progress Notes (Signed)
Patient transported to [facility]uro for lumbar puncture.

## 2023-07-20 NOTE — Evaluation (Addendum)
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 No pertinent past medical history. Past Surgical History: History reviewed. No pertinent surgical history. HPI:  Ernest Campbell is a 72 y.o. male with a PMHx of Parkinson's disease on Sinemet, mild cognitive impairment, TBI and brain bleed s/p fall in 2022 which led to 180 days of severe cognitive and physical disability per the wife's report, cluster headaches, back pain with bilateral S1/L5 radiculopathy (diagnosed at [facility] with electrophysiological and clinical assessment), prior epidural injection through [facility] Dr. Merilyn Baba and foot drop, last seen by outpatient Neurologist Dr. Vickey Huger on 02/01/22, who presented on Thursday afternoon with intermittent chest pain x 2 weeks as well as a bad headache that started the morning he presented to the ED. Wife felt that he was having trouble doing normal tasks at home.  MRI of head revealed  Punctate 4 mm acute ischemic nonhemorrhagic right PCA  distribution infarct involving the right occipital pole.   Assessment / Plan / Recommendation Clinical Impression   Pt presents with at least a moderate level cognitive impairment this date following acute CVA likely exacerbated with hx of TBI. Note hx of PD and mild cognitive impairment as described in neurology consult. [employer]  Mental Status exam given: 5/30 with deficits noted in orientation, immediate and delayed recall, visual spatial skills, executive function, awareness, and problem solving. Spouse present and assisted with PLOF. 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 Pts receptive and expressive language skills appeared intact. Pt did however have some lower vocal intensity of speech making it challenging to understand communication at the conversational level. Suspect this is multifactorial in nature from PD, prior TBI and CVA. Pt would benefit from respiratory muscle strength training if able to sequence appropriately in future sessions.  Pt resides with spouse (who works remote) and two granddaughters. Recommend continued speech intervention for novel deficits described above to maximize safety and independence.      SLP Assessment  SLP Recommendation/Assessment: Patient needs continued Speech Lanaguage Pathology Services SLP Visit Diagnosis: Cognitive communication deficit (R41.841)    Recommendations for follow up therapy are one component of a multi-disciplinary discharge planning process, led by the attending physician.  Recommendations may be updated based on patient status, additional functional criteria and insurance authorization.    Follow Up Recommendations  Acute inpatient rehab (3hours/day)    Assistance Recommended at Discharge  Frequent or constant Supervision/Assistance  Functional Status Assessment Patient has had a recent decline in their functional status and demonstrates the ability to make significant improvements in function in a reasonable and predictable amount of time.  Frequency and Duration min 2x/week  2 weeks      SLP Evaluation Cognition  Overall Cognitive Status: Impaired/Different from  baseline Arousal/Alertness: Awake/alert Orientation Level: Oriented  to person;Disoriented to time;Disoriented to situation Year:  (2004) Month: August Day of Week: Incorrect Attention: Focused;Sustained Focused Attention: Impaired Focused Attention Impairment: Verbal complex;Functional complex Sustained Attention: Impaired Sustained Attention Impairment: Verbal complex;Functional complex Memory: Impaired Memory Impairment: Decreased short term memory;Decreased recall of new information Decreased Short Term Memory: Verbal complex;Functional complex Awareness: Impaired Problem Solving: Impaired Problem Solving Impairment: Verbal complex;Verbal basic;Functional basic;Functional complex Executive Function: Organizing;Sequencing;Self Monitoring Sequencing: Impaired Organizing: Impaired Self Monitoring: Impaired Safety/Judgment: Impaired       Comprehension  Auditory Comprehension Overall Auditory Comprehension: Appears within functional limits for tasks assessed    Expression Expression Primary Mode of Expression: Verbal Verbal Expression Overall Verbal Expression: Appears within functional limits for tasks assessed Written Expression Dominant Hand: Right   Oral / Motor  Oral Motor/Sensory Function Overall Oral Motor/Sensory Function: Within functional limits Motor Speech Overall Motor Speech: Other (comment) (spouse reports low vocal intensity since TBI but notes no difference since stroke) Respiration: Impaired Level of Impairment: Sentence Phonation: Low vocal intensity Resonance: Within functional limits Articulation: Impaired Motor Planning: Witnin functional limits Effective Techniques: Slow rate;Increased vocal intensity;Over-articulate           Ardyth Gal MA, CCC-SLP Acute Rehabilitation Services   07/20/2023, 11:49 AM

## 2023-07-20 NOTE — Progress Notes (Signed)
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  He has made a full recovery from those injuries and was active and alert and back to baseline.Marland Kitchen He iwoke up with a severe headache, does have a history of cluster headaches so it seemed not out of the ordinary.  They were able to get rid of the headache but around 1130 the patient started to have a headache again which is very unusual.  He also complained of some blurry vision and later double vision.  The wife said she heard some banging and went to the bathroom and the patient was in the bathroom and could not figure out how to get out of the bathroom.  None of this behavior is baseline.  He usually has some coffee while sitting in the recliner.  She found him laying on the floor on the table in front of the recliner. The patient said he was not able to see the table.  He also needed help getting dressed he could not figure out how to put his shirt on himself.  None of that is baseline.  The patient's wife called the doctor and was instructed to bring him to the ER right away.  The patient never had any difficulty speaking or eating or swallowing or walking.  He walked independently into the ER.  He it was the headache and double vision and the behavior that was her primary concern. In the ED: Vitals fairly stable afebrile Unremarkable labs CT scan of the head which was unremarkable. MRI which revealed an acute nonhemorrhagic right PCA stroke. Neurology was consulted and admitted for further management Later in the emergency department the patient developed another headache.  He did receive Benadryl and fentanyl and Reglan.  And later received Ativan but the patient got very agitated and more confused  after these medications.  He was not able to lay still to have the CTA that was ordered. Seen by neurology placed on empiric antibiotics for meningitis   Subjective: Seen and examined. Working w/ PTOT on bedside commode No neck stiffness able to tell me his name but confused Overnight patient had been afebrile BP stable not hypoxic Labs reviewed with a stable CBC CRP 0.5 LDL 128, A1c 5.8 UA unremarkable Was agitated overnight-received Haldol 5 mg at 10:30 PM, had received Zyprexa in the afternoon 12/13  Assessment and Plan: Principal Problem:   CVA (cerebral vascular accident) (HCC)  Acute right PCA stroke: Complete stroke workup PT OT speech eval as below. LDL 128 goal <70,A1c 5.8 goal  BP goal : Permissive HTN upto 220/110 mmHg  Echocardiogram-was not completed but EF 55-60%. Carotid duplex ok. Cont PT OT SLP, Telemetry monitoring and frequent neuro checks Continue aspirin 81   Acute encephalopathy with agitation: ?Etiology. CRP 0.5 UA unremarkable, chest x-ray 12/12 no pneumonia. Appreciate input from neurology started on empiric antibiotic for meningitis, awaiting for LP studies Continue delirium precaution fall precaution supportive care. More alert today but still confused. Movign all hsi extremities and follows some commands. Cont one to one, restraints as needed: and prn haldol  1. Avoid/Minimize benzodiazepines, antihistamines, anticholinergics, and minimize opiate if possible 2: Assess, prevent and manage pain as lack of treatment can result in delirium.  3: Provide appropriate lighting and clear signage;  a clock and calendar should be easily visible to the patient. 4: Monitor environmental factors. Reduce light and noise at night (close shades, turn off lights, turn off TV, ect). Correct any alterations in sleep cycle. 5: Reorient the patient to person, place, time and situation on each encounter.  6: Correct sensory deficits if possible (replace eye glasses, hearing  aids, ect). 7: Avoid restraints if able. Severely delirious patients benefit from constant observation by a sitter  Keep on Delirium precaution supportive care fall precaution  History of TBI Cluster headache: Continue home Sinemet, pain management, Aricept  DVT prophylaxis: enoxaparin (LOVENOX) injection 40 mg Start: 07/19/23 2016 Code Status:   Code Status: Full Code Family Communication: plan of care discussed with patient/wife at bedside 12/13. Patient status is: Remains hospitalized because of severity of illness Level of care: Progressive   Dispo: The patient is from: home            Anticipated disposition: TBD Objective: Vitals last 24 hrs: Vitals:   07/20/23 0034 07/20/23 0404 07/20/23 0733 07/20/23 0735  BP: (!) 132/98 (!) 130/92 (!) 106/50   Pulse: 86 71 68   Resp:   20   Temp: 97.8 F (36.6 C) 98 F (36.7 C)  98 F (36.7 C)  TempSrc: Oral Oral  Oral  SpO2:   100%   Weight:      Height:       Weight change:   Physical Examination: General exam: alert awake, oriented  to self only HEENT:Oral mucosa moist, Ear/Nose WNL grossly Respiratory system: Bilaterally clear BS,no use of accessory muscle Cardiovascular system: S1 & S2 +, No JVD. Gastrointestinal system: Abdomen soft,NT,ND, BS+ Nervous System: Alert, awake, moving all his extremities ok and able to followi some commands. Extremities: LE edema neg,distal peripheral pulses palpable and warm.  Skin: No rashes,no icterus. MSK: Normal muscle bulk,tone, power   Medications reviewed:  Scheduled Meds:   stroke: early stages of recovery book   Does not apply Once   aspirin EC  81 mg Oral Daily   carbidopa-levodopa  1 tablet Oral TID WC   donepezil  5 mg Oral QHS   enoxaparin (LOVENOX) injection  40 mg Subcutaneous Q24H   Continuous Infusions:  acyclovir 650 mg (07/20/23 0403)   ampicillin (OMNIPEN) IV 2 g (07/20/23 0549)   cefTRIAXone (ROCEPHIN)  IV 2 g (07/19/23 2201)   lactated ringers 125 mL/hr at  07/19/23 1036   vancomycin 500 mg (07/19/23 2245)    Diet Order             DIET DYS 3 Room service appropriate? No; Fluid consistency: Thin  Diet effective now                  Intake/Output Summary (Last 24 hours) at 07/20/2023 0949 Last data filed at 07/20/2023 0900 Gross per 24 hour  Intake 1552.2 ml  Output 1600 ml  Net -47.8 ml   Net IO Since Admission: -47.8 mL [07/20/23 0949]  Wt Readings from Last 3 Encounters:  07/18/23 63.5 kg  01/31/23 63.7 kg  02/01/22 64.6 kg     Unresulted Labs (From admission, onward)     Start     Ordered   07/20/23 0817  Comprehensive metabolic panel  Daily,   R      07/20/23 0816   07/20/23 0817  CBC  Daily,   R      07/20/23 0816          Data Reviewed: I have  personally reviewed following labs and imaging studies CBC: Recent Labs  Lab 07/18/23 1523 07/18/23 1528 07/20/23 0843  WBC 9.9  --  8.5  NEUTROABS 6.8  --   --   HGB 14.1 14.3 13.1  HCT 41.1 42.0 38.0*  MCV 97.2  --  94.3  PLT 239  --  216   Basic Metabolic Panel:  Recent Labs  Lab 07/18/23 1523 07/18/23 1528 07/20/23 0843  NA 137 138 139  K 4.0 3.9 4.2  CL 103 102 107  CO2 26  --  23  GLUCOSE 96 95 85  BUN 21 20 10   CREATININE 0.93 1.10 1.12  CALCIUM 9.5  --  8.6*   GFR: Estimated Creatinine Clearance: 55.1 mL/min (by C-G formula based on SCr of 1.12 mg/dL). Liver Function Tests:  Recent Labs  Lab 07/18/23 1523 07/20/23 0843  AST 16 20  ALT 6 17  ALKPHOS 61 49  BILITOT 0.8 1.3*  PROT 6.6 5.3*  ALBUMIN 4.2 3.0*   No results for input(s): "LIPASE", "AMYLASE" in the last 168 hours. No results for input(s): "AMMONIA" in the last 168 hours. Coagulation Profile:  Recent Labs  Lab 07/18/23 1523  INR 1.0   No results for input(s): "PROBNP" in the last 168 hours.  Recent Labs    07/19/23 0642  HGBA1C 5.8*   Recent Labs  Lab 07/18/23 1508 07/18/23 1519  GLUCAP 110* 110*   Recent Labs    07/19/23 0642  CHOL 189  HDL 51  LDLCALC  128*  TRIG 48  CHOLHDL 3.7  Antimicrobials/Microbiology: Anti-infectives (From admission, onward)    Start     Dose/Rate Route Frequency Ordered Stop   07/19/23 2315  vancomycin (VANCOREADY) IVPB 500 mg/100 mL       Placed in "Followed by" Linked Group   500 mg 100 mL/hr over 60 Minutes Intravenous Every 12 hours 07/19/23 1024     07/19/23 1200  ampicillin (OMNIPEN) 2 g in sodium chloride 0.9 % 100 mL IVPB        2 g 300 mL/hr over 20 Minutes Intravenous Every 4 hours 07/19/23 1024     07/19/23 1115  cefTRIAXone (ROCEPHIN) 2 g in sodium chloride 0.9 % 100 mL IVPB        2 g 200 mL/hr over 30 Minutes Intravenous Every 12 hours 07/19/23 1024     07/19/23 1115  acyclovir (ZOVIRAX) 650 mg in dextrose 5 % 100 mL IVPB        650 mg 113 mL/hr over 60 Minutes Intravenous Every 8 hours 07/19/23 1024     07/19/23 1115  vancomycin (VANCOREADY) IVPB 1250 mg/250 mL       Placed in "Followed by" Linked Group   1,250 mg 166.7 mL/hr over 90 Minutes Intravenous  Once 07/19/23 1024 07/19/23 1508         Component Value Date/Time   SDES CSF 07/13/2021 1505   SPECREQUEST NONE 07/13/2021 1505   CULT  07/13/2021 1505    NO GROWTH 3 DAYS Performed at [facility], [address] [place], [place] [address]    REPTSTATUS 07/17/2021 FINAL 07/13/2021 1505     Radiology Studies: ECHOCARDIOGRAM COMPLETE Result Date: 07/19/2023    ECHOCARDIOGRAM REPORT   Patient Name:   Ernest Campbell Date of Exam: 07/19/2023 Medical Rec #:  [id number]        Height:       67.0 in Accession #:    [id number]  Weight:       140.0 lb Date of Birth:  1951-01-12         BSA:          1.738 m Patient Age:    72 years         BP:           118/59 mmHg Patient Gender: M                HR:           80 bpm. Exam Location:  Inpatient Procedure: 2D Echo Indications:    Stroke  History:        Patient has no prior history of Echocardiogram examinations.                 Stroke; Signs/Symptoms:Altered Mental Status and  Chest Pain.  Sonographer:    Sheralyn Boatman RDCS Referring Phys: [phone] CLAUDIA CLAIBORNE  Sonographer Comments: Image acquisition challenging due to uncooperative patient. Patient pushed probe away and put arms across chest. Exam ended. IMPRESSIONS  1. Incomplete echocardiogram due to patient preference. Only one diagnostic image obtained.  2. Left ventricular ejection fraction, by estimation, is 55 to 60%. The left ventricle has normal function.  3. Right ventricular systolic function is normal. The right ventricular size is normal. FINDINGS  Left Ventricle: Left ventricular ejection fraction, by estimation, is 55 to 60%. The left ventricle has normal function. There is no left ventricular hypertrophy. Right Ventricle: The right ventricular size is normal. Right ventricular systolic function is normal. Aorta: The aortic root is normal in size and structure.  LEFT VENTRICLE PLAX 2D LVIDd:         3.90 cm LVIDs:         2.60 cm LV PW:         0.80 cm LV IVS:        0.90 cm  LEFT ATRIUM         Index LA diam:    2.90 cm 1.67 cm/m   AORTA Ao Root diam: 3.10 cm Weston Brass MD Electronically signed by Weston Brass MD Signature Date/Time: 07/19/2023/4:51:32 PM    Final    VAS US CAROTID (at [facility] and [facility] only) Result Date: 07/19/2023 Carotid Arterial Duplex Study Patient Name:  Ernest Campbell  Date of Exam:   07/19/2023 Medical Rec #: [id number]         Accession #:    [id number] Date of Birth: Nov 20, 1950          Patient Gender: M Patient Age:   67 years Exam Location:  [facility] Procedure:      VAS US CAROTID Referring Phys: Buena Irish --------------------------------------------------------------------------------  Indications:       CVA. Risk Factors:      None. Limitations        Today's exam was limited due to the patient's inability or                    unwillingness to cooperate, the patient's respiratory                    variation and patient constant movement. Comparison Study:  No prior  studies. Performing Technologist: Chanda Busing RVT  Examination Guidelines: A complete evaluation includes B-mode imaging, spectral Doppler, color Doppler, and power Doppler as needed of all accessible portions of each vessel. Bilateral testing is considered an integral part of a complete examination. Limited examinations for reoccurring indications  may be performed as noted.  Right Carotid Findings: +----------+--------+--------+--------+-----------------------+--------+           PSV cm/sEDV cm/sStenosisPlaque Description     Comments +----------+--------+--------+--------+-----------------------+--------+ CCA Prox  105     17              smooth and heterogenous         +----------+--------+--------+--------+-----------------------+--------+ CCA Distal115     28              smooth and heterogenous         +----------+--------+--------+--------+-----------------------+--------+ ICA Prox  105     24              smooth and heterogenous         +----------+--------+--------+--------+-----------------------+--------+ ICA Mid   94      31                                              +----------+--------+--------+--------+-----------------------+--------+ ICA Distal60      18                                              +----------+--------+--------+--------+-----------------------+--------+ ECA       151     14                                              +----------+--------+--------+--------+-----------------------+--------+ +----------+--------+-------+--------+-------------------+           PSV cm/sEDV cmsDescribeArm Pressure (mmHG) +----------+--------+-------+--------+-------------------+ [id number]                                        +----------+--------+-------+--------+-------------------+ +---------+--------+--+--------+--+---------+ VertebralPSV cm/s77EDV cm/s16Antegrade +---------+--------+--+--------+--+---------+  Left Carotid  Findings: +----------+--------+--------+--------+-----------------------+--------+           PSV cm/sEDV cm/sStenosisPlaque Description     Comments +----------+--------+--------+--------+-----------------------+--------+ CCA Prox  152     27              smooth and heterogenous         +----------+--------+--------+--------+-----------------------+--------+ CCA Distal93      27              smooth and heterogenous         +----------+--------+--------+--------+-----------------------+--------+ ICA Prox  76      23              smooth and heterogenous         +----------+--------+--------+--------+-----------------------+--------+ ICA Mid   67      18                                              +----------+--------+--------+--------+-----------------------+--------+ ICA Distal101     26                                              +----------+--------+--------+--------+-----------------------+--------+  ECA       167     22                                              +----------+--------+--------+--------+-----------------------+--------+ +----------+--------+--------+--------+-------------------+           PSV cm/sEDV cm/sDescribeArm Pressure (mmHG) +----------+--------+--------+--------+-------------------+ Subclavian175                                         +----------+--------+--------+--------+-------------------+ +---------+--------+--+--------+--+---------+ VertebralPSV cm/s41EDV cm/s11Antegrade +---------+--------+--+--------+--+---------+   Summary: Right Carotid: Velocities in the right ICA are consistent with a 1-39% stenosis. Left Carotid: Velocities in the left ICA are consistent with a 1-39% stenosis. Vertebrals: Bilateral vertebral arteries demonstrate antegrade flow. *See table(s) above for measurements and observations.     Preliminary    EEG adult Result Date: 07/19/2023 Charlsie Quest, MD     07/19/2023 12:30 PM Patient  Name: Ernest Campbell MRN: [id number] Epilepsy Attending: Charlsie Quest Referring Physician/Provider: Caryl Pina, MD Date: 07/19/2023 Duration: 23.34 mins Patient history: 72 yo M presented on a bad headache that started the morning he presented to the ED, in conjunction with blurred vision, diplopia and acute confusion. EEG to evaluate for seizure Level of alertness: Awake AEDs during EEG study: None Technical aspects: This EEG study was done with scalp electrodes positioned according to the 10-20 International system of electrode placement. Electrical activity was reviewed with band pass filter of 1-70Hz , sensitivity of 7 uV/mm, display speed of 62mm/sec with a 60Hz  notched filter applied as appropriate. EEG data were recorded continuously and digitally stored.  Video monitoring was available and reviewed as appropriate. Description: The posterior dominant rhythm consists of 7 Hz activity of moderate voltage (25-35 uV) seen predominantly in posterior head regions, symmetric and reactive to eye opening and eye closing. EEG showed continuous generalized 5 to 7 Hz theta slowing. Hyperventilation and photic stimulation were not performed.   ABNORMALITY - Continuous slow, generalized IMPRESSION: This study is suggestive of mild diffuse encephalopathy. No seizures or epileptiform discharges were seen throughout the recording. Charlsie Quest   MR Brain Wo Contrast (neuro protocol) Result Date: 07/18/2023 CLINICAL DATA:  Initial evaluation for acute neuro deficit, stroke suspected. EXAM: MRI HEAD WITHOUT CONTRAST TECHNIQUE: Multiplanar, multiecho pulse sequences of the brain and surrounding structures were obtained without intravenous contrast. COMPARISON:  Prior CT from earlier the same day. FINDINGS: Brain: Examination mildly limited due to motion artifact. Cerebral volume within normal limits. Patchy T2/FLAIR hyperintensity seen involving the periventricular deep white matter both cerebral hemispheres,  consistent with chronic small vessel ischemic disease, mild to moderate in nature. Punctate 4 mm focus of restricted diffusion seen involving the right occipital pole, consistent with a small acute ischemic infarct, right PCA distribution (series 5, image 12). No associated hemorrhage or mass effect. No other evidence for acute or subacute ischemia. Gray-white matter differentiation otherwise maintained. No areas of chronic cortical infarction. No acute or chronic intracranial blood products. No mass lesion, midline shift or mass effect. No hydrocephalus or extra-axial fluid collection. Pituitary gland and suprasellar region within normal limits. Vascular: Major intracranial vascular flow voids are maintained. Skull and upper cervical spine: Craniocervical junction within normal limits. Bone marrow signal intensity normal. No scalp soft tissue abnormality. Sinuses/Orbits: Susceptibility artifact partially obscures  the right globe. Visualized globes and orbits grossly within normal limits. Chronic right maxillary sinusitis. No mastoid effusion. Other: None. IMPRESSION: 1. Punctate 4 mm acute ischemic nonhemorrhagic right PCA distribution infarct involving the right occipital pole. 2. Underlying mild to moderate chronic microvascular ischemic disease. 3. Chronic right maxillary sinusitis. Electronically Signed   By: Rise Mu M.D.   On: 07/18/2023 19:34   DG Chest 2 View Result Date: 07/18/2023 CLINICAL DATA:  Chest pain. EXAM: CHEST - 2 VIEW COMPARISON:  None Available. FINDINGS: Clear lungs. Normal heart size and mediastinal contours. No pleural effusion or pneumothorax. Visualized bones and upper abdomen are unremarkable. IMPRESSION: No evidence of acute cardiopulmonary disease. Electronically Signed   By: Orvan Falconer M.D.   On: 07/18/2023 16:15   CT HEAD WO CONTRAST Result Date: 07/18/2023 CLINICAL DATA:  Headache, neuro deficit. EXAM: CT HEAD WITHOUT CONTRAST TECHNIQUE: Contiguous axial  images were obtained from the base of the skull through the vertex without intravenous contrast. RADIATION DOSE REDUCTION: This exam was performed according to the departmental dose-optimization program which includes automated exposure control, adjustment of the mA and/or kV according to patient size and/or use of iterative reconstruction technique. COMPARISON:  Head CT 04/25/2021.  MRI brain 07/13/2021. FINDINGS: Brain: No acute hemorrhage. Unchanged mild chronic small-vessel disease. Cortical gray-white differentiation is otherwise preserved. Prominence of the ventricles and sulci within expected range for age. No hydrocephalus or extra-axial collection. No mass effect or midline shift. Vascular: No hyperdense vessel or unexpected calcification. Skull: No calvarial fracture or suspicious bone lesion. Skull base is unremarkable. Sinuses/Orbits: Right maxillary sinus atelectasis. Orbits are unremarkable. Other: None. IMPRESSION: 1. No acute intracranial abnormality. 2. Unchanged mild chronic small-vessel disease. 3. Right maxillary sinus atelectasis. Electronically Signed   By: Orvan Falconer M.D.   On: 07/18/2023 16:14     LOS: 2 days   Total time spent in review of labs and imaging, patient evaluation, formulation of plan, documentation and communication with family: 50 minutes  Lanae Boast, MD [facility]  07/20/2023, 9:49 AM

## 2023-07-20 NOTE — Procedures (Addendum)
PROCEDURE SUMMARY:  Successful image-guided lumbar puncture at level of L2-3.  Opening pressure 10 cm water.  Yielded 9 mL of  CSF, CSF was clear and colorless but had blood tinge in tube 3 - neurology notified.  No immediate complications.  EBL = trace. Patient tolerated well.   Specimen was sent for labs.  Please see imaging section of Epic for full dictation.   Post procedure orders placed.  - Bedrest with bathroom privileges for 4 hours  - Remain flat in bed for 2 hours  - Remain flat in the bed for rest of the day as much as possible  - Patient is allergic to Tylenol, may consider non opoid analgesic for HA, plan per primary team.   Willette Brace PA-C 07/20/2023 1:44 PM

## 2023-07-20 NOTE — Plan of Care (Signed)
  Problem: Elimination: Goal: Will not experience complications related to bowel motility Outcome: Progressing Goal: Will not experience complications related to urinary retention Outcome: Progressing   Problem: Pain Management: Goal: General experience of comfort will improve Outcome: Progressing   Problem: Safety: Goal: Ability to remain free from injury will improve Outcome: Progressing   Problem: Skin Integrity: Goal: Risk for impaired skin integrity will decrease Outcome: Progressing   Problem: Safety: Goal: Non-violent Restraint(s) Outcome: Progressing

## 2023-07-21 ENCOUNTER — Inpatient Hospital Stay (HOSPITAL_COMMUNITY): Payer: Medicare Other

## 2023-07-21 DIAGNOSIS — I639 Cerebral infarction, unspecified: Secondary | ICD-10-CM | POA: Diagnosis not present

## 2023-07-21 DIAGNOSIS — R4182 Altered mental status, unspecified: Secondary | ICD-10-CM

## 2023-07-21 LAB — COMPREHENSIVE METABOLIC PANEL
ALT: 18 U/L (ref 0–44)
AST: 20 U/L (ref 15–41)
Albumin: 3 g/dL — ABNORMAL LOW (ref 3.5–5.0)
Alkaline Phosphatase: 54 U/L (ref 38–126)
Anion gap: 11 (ref 5–15)
BUN: 9 mg/dL (ref 8–23)
CO2: 18 mmol/L — ABNORMAL LOW (ref 22–32)
Calcium: 8.7 mg/dL — ABNORMAL LOW (ref 8.9–10.3)
Chloride: 108 mmol/L (ref 98–111)
Creatinine, Ser: 1.19 mg/dL (ref 0.61–1.24)
GFR, Estimated: >60 mL/min (ref >60)
Glucose, Bld: 98 mg/dL (ref 70–99)
Potassium: 4 mmol/L (ref 3.5–5.1)
Sodium: 137 mmol/L (ref 135–145)
Total Bilirubin: 0.9 mg/dL (ref <1.2)
Total Protein: 5.6 g/dL — ABNORMAL LOW (ref 6.5–8.1)

## 2023-07-21 LAB — CBC
HCT: 40 % (ref 39.0–52.0)
Hemoglobin: 13.5 g/dL (ref 13.0–17.0)
MCH: 32.5 pg (ref 26.0–34.0)
MCHC: 33.8 g/dL (ref 30.0–36.0)
MCV: 96.4 fL (ref 80.0–100.0)
Platelets: 225 10*3/uL (ref 150–400)
RBC: 4.15 MIL/uL — ABNORMAL LOW (ref 4.22–5.81)
RDW: 12.8 % (ref 11.5–15.5)
WBC: 7.9 10*3/uL (ref 4.0–10.5)
nRBC: 0 % (ref 0.0–0.2)

## 2023-07-21 MED ORDER — IOHEXOL 350 MG/ML SOLN
75.0000 mL | Freq: Once | INTRAVENOUS | Status: AC | PRN
  Administered 2023-07-21: 75 mL via INTRAVENOUS

## 2023-07-21 MED ORDER — ATORVASTATIN CALCIUM 40 MG PO TABS: 40.0000 mg | ORAL_TABLET | Freq: Every day | ORAL | 0 refills | Status: DC

## 2023-07-21 MED ORDER — MENTHOL 3 MG MT LOZG: 1.0000 | LOZENGE | OROMUCOSAL | Status: DC | PRN

## 2023-07-21 MED ORDER — ASPIRIN 81 MG PO TBEC: 81.0000 mg | DELAYED_RELEASE_TABLET | Freq: Every day | ORAL | 12 refills | Status: DC

## 2023-07-21 NOTE — Progress Notes (Signed)
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  He has made a full recovery from those injuries and was active and alert and back to baseline.Marland Kitchen He iwoke up with a severe headache, does have a history of cluster headaches so it seemed not out of the ordinary.  They were able to get rid of the headache but around 1130 the patient started to have a headache again which is very unusual.  He also complained of some blurry vision and later double vision.  The wife said she heard some banging and went to the bathroom and the patient was in the bathroom and could not figure out how to get out of the bathroom.  None of this behavior is baseline.  He usually has some coffee while sitting in the recliner.  She found him laying on the floor on the table in front of the recliner. The patient said he was not able to see the table.  He also needed help getting dressed he could not figure out how to put his shirt on himself.  None of that is baseline.  The patient's wife called the doctor and was instructed to bring him to the ER right away.  The patient never had any difficulty speaking or eating or swallowing or walking.  He walked independently into the ER.  He it was the headache and double vision and the behavior that was her primary concern. In the ED: Vitals fairly stable afebrile Unremarkable labs CT scan of the head which was unremarkable. MRI which revealed an acute nonhemorrhagic right PCA stroke. Neurology was consulted and admitted for further management Later in the emergency department the patient developed another headache.  He did receive Benadryl and fentanyl and Reglan.  And later received Ativan but the patient got very agitated and more confused  after these medications.  He was not able to lay still to have the CTA that was ordered. Seen by neurology placed on empiric antibiotics for meningitis 12/14: Underwent LP,  CT angio head and neck- no LVO   Subjective: Seen this am Doing well this am aaox3 slept well-see she thinks patient is back to baseline Past 24 hours patient has been afebrile BP stable  CBC CMP unremarkable  Underwent successful LP under fluoroscopy yesterday   Assessment and Plan: Principal Problem:   CVA (cerebral vascular accident) (HCC)  Acute right PCA stroke: Complete stroke workup PT OT neurology eval So far LDL 128 goal <70,A1c 5.8 goal TTE- limited but EF 55-60%. Carotid duplex ok, CTAngio h/neck NO lvo Cont PT OT SLP, Telemetry monitoring and frequent neuro checks. Continue aspirin 81, started on Lipitor.   Acute encephalopathy with agitation: ?Etiology.  Multifactorial likely multifactorial, patient received multiple deliriogenic  MEDS- Benedryl fentanyl benzo for his headache.CRP 0.5.UA unremarkable, CXR 12/12 no pneumonia. Now he is aaox3 Seen by neurology-?  Meningitis-empiric acyclovir/ampicillin/ceftriaxone/vancomycin started  S/P  LP:12/14-WBC 3, glucose 59 protein 52, Gram stain no organisms seen culture pending.  Await further input from neurology-less likely meningitis.  Mentation has improved at this time. Continue on delirium precaution fall precaution supportive care PRN Haldol   History of TBI Cluster headache: Continue home Sinemet, pain management, Aricept  DVT prophylaxis: enoxaparin (LOVENOX) injection 40 mg Start: 07/19/23 2016 Code Status:  Code Status: Full Code Family Communication: plan of care discussed with patient/wife updated at bedside 12/13. Patient status is: Remains hospitalized because of severity of illness Level of care: Progressive   Dispo: The patient is from: home            Anticipated disposition: CIR per OT and they want to go home.  Anticipate discharge  tomorrow  Objective: Vitals last 24 hrs: Vitals:   07/21/23 0040 07/21/23 0400 07/21/23 0437 07/21/23 0756  BP: (!) 121/57 123/64  (!) 131/59  Pulse: 76 62  68  Resp: 17   17  Temp: 98.5 F (36.9 C) 98.1 F (36.7 C)  (!) 97.3 F (36.3 C)  TempSrc: Axillary Axillary  Oral  SpO2: 99% (!) 83% 99% 100%  Weight:      Height:       Weight change:   Physical Examination: General exam: alert awake, orientedx3, shuffling gait and weak HEENT:Oral mucosa moist, Ear/Nose WNL grossly Respiratory system: Bilaterally clear BS,no use of accessory muscle Cardiovascular system: S1 & S2 +, No JVD. Gastrointestinal system: Abdomen soft,NT,ND, BS+ Nervous System: Alert, awake, moving all extremities,and following commands. Extremities: LE edema neg,distal peripheral pulses palpable and warm.  Skin: No rashes,no icterus. MSK: Normal muscle bulk,tone, power   Medications reviewed:  Scheduled Meds:  aspirin EC  81 mg Oral Daily   atorvastatin  40 mg Oral Daily   carbidopa-levodopa  1 tablet Oral TID WC   donepezil  5 mg Oral QHS   enoxaparin (LOVENOX) injection  40 mg Subcutaneous Q24H   nicotine  14 mg Transdermal Daily   Continuous Infusions:  acyclovir 650 mg (07/21/23 0559)   ampicillin (OMNIPEN) IV 2 g (07/21/23 0844)   cefTRIAXone (ROCEPHIN)  IV 2 g (07/21/23 0847)   vancomycin 100 mL/hr at 07/21/23 0042    Diet Order             Diet regular Room service appropriate? Yes with Assist; Fluid consistency: Thin  Diet effective now                  Intake/Output Summary (Last 24 hours) at 07/21/2023 0858 Last data filed at 07/21/2023 0414 Gross per 24 hour  Intake 1646.34 ml  Output 1500 ml  Net 146.34 ml   Net IO Since Admission: 180.54 mL [07/21/23 0858]  Wt Readings from Last 3 Encounters:  07/18/23 63.5 kg  01/31/23 63.7 kg  02/01/22 64.6 kg     Unresulted Labs (From admission, onward)     Start     Ordered   07/20/23 0817  Comprehensive metabolic panel  Daily,    R      07/20/23 0816   07/20/23 0817  CBC  Daily,   R      07/20/23 0816          Data Reviewed: I have personally reviewed following labs and imaging studies CBC: Recent Labs  Lab 07/18/23 1523 07/18/23 1528 07/20/23 0843 07/21/23 0722  WBC 9.9  --  8.5 7.9  NEUTROABS 6.8  --   --   --   HGB 14.1 14.3 13.1 13.5  HCT 41.1 42.0 38.0* 40.0  MCV 97.2  --  94.3 96.4  PLT 239  --  216 225   Basic Metabolic Panel:  Recent Labs  Lab 07/18/23 1523 07/18/23 1528 07/20/23 0843 07/21/23 0722  NA 137 138 139 137  K 4.0 3.9 4.2 4.0  CL 103 102 107 108  CO2 26  --  23 18*  GLUCOSE 96 95 85 98  BUN 21 20 10 9   CREATININE 0.93 1.10 1.12 1.19  CALCIUM 9.5  --  8.6* 8.7*   GFR: Estimated Creatinine Clearance: 51.9 mL/min (by C-G formula based on SCr of 1.19 mg/dL). Liver Function Tests:  Recent Labs  Lab 07/18/23 1523 07/20/23 0843 07/21/23 0722  AST 16 20 20   ALT 6 17 18   ALKPHOS 61 49 54  BILITOT 0.8 1.3* 0.9  PROT 6.6 5.3* 5.6*  ALBUMIN 4.2 3.0* 3.0*   No results for input(s): "LIPASE", "AMYLASE" in the last 168 hours. No results for input(s): "AMMONIA" in the last 168 hours. Coagulation Profile:  Recent Labs  Lab 07/18/23 1523  INR 1.0   No results for input(s): "PROBNP" in the last 168 hours.  Recent Labs    07/19/23 0642  HGBA1C 5.8*   Recent Labs  Lab 07/18/23 1508 07/18/23 1519  GLUCAP 110* 110*   Recent Labs    07/19/23 0642  CHOL 189  HDL 51  LDLCALC 128*  TRIG 48  CHOLHDL 3.7  Antimicrobials/Microbiology: Anti-infectives (From admission, onward)    Start     Dose/Rate Route Frequency Ordered Stop   07/20/23 2230  acyclovir (ZOVIRAX) 650 mg in dextrose 5 % 100 mL IVPB        650 mg 113 mL/hr over 60 Minutes Intravenous Every 8 hours 07/20/23 1617     07/19/23 2315  vancomycin (VANCOREADY) IVPB 500 mg/100 mL       Placed in "Followed by" Linked Group   500 mg 100 mL/hr over 60 Minutes Intravenous Every 12 hours 07/19/23 1024      07/19/23 1200  ampicillin (OMNIPEN) 2 g in sodium chloride 0.9 % 100 mL IVPB        2 g 300 mL/hr over 20 Minutes Intravenous Every 4 hours 07/19/23 1024     07/19/23 1115  cefTRIAXone (ROCEPHIN) 2 g in sodium chloride 0.9 % 100 mL IVPB        2 g 200 mL/hr over 30 Minutes Intravenous Every 12 hours 07/19/23 1024     07/19/23 1115  acyclovir (ZOVIRAX) 650 mg in dextrose 5 % 100 mL IVPB  Status:  Discontinued        650 mg 113 mL/hr over 60 Minutes Intravenous Every 8 hours 07/19/23 1024 07/20/23 1617   07/19/23 1115  vancomycin (VANCOREADY) IVPB 1250 mg/250 mL       Placed in "Followed by" Linked Group   1,250 mg 166.7 mL/hr over 90 Minutes Intravenous  Once 07/19/23 1024 07/19/23 1508         Component Value Date/Time   SDES CSF 07/20/2023 1355   SPECREQUEST NONE 07/20/2023 1355   CULT PENDING 07/20/2023 1355   REPTSTATUS PENDING 07/20/2023 1355     Radiology Studies: CT ANGIO HEAD NECK W WO CM Result Date: 07/21/2023 CLINICAL DATA:  Follow-up examination for stroke, diplopia. EXAM: CT ANGIOGRAPHY HEAD AND NECK WITH AND WITHOUT CONTRAST TECHNIQUE: Multidetector CT imaging of the head and neck was performed using the standard protocol during bolus administration of intravenous contrast. Multiplanar CT image reconstructions and MIPs were obtained to evaluate the vascular anatomy. Carotid stenosis measurements (when applicable) are obtained utilizing NASCET criteria, using the distal internal carotid diameter as the denominator. RADIATION DOSE REDUCTION: This exam was performed according to the departmental dose-optimization program which includes automated exposure control, adjustment of the mA and/or kV according to patient size and/or use of iterative reconstruction technique. CONTRAST:  75mL OMNIPAQUE IOHEXOL 350 MG/ML SOLN COMPARISON:  Comparison made with prior CT and MRI from 07/18/2023. FINDINGS: CT HEAD FINDINGS Brain: Cerebral volume within normal limits. Mild-to-moderate chronic  microvascular ischemic disease noted. Previously identified punctate right occipital infarct not visible by CT. No other acute large vessel territory infarct. No acute intracranial hemorrhage. No mass lesion, midline shift or mass effect. No hydrocephalus or extra-axial fluid collection. Vascular: No abnormal hyperdense vessel. Scattered vascular calcifications noted within the carotid siphons. Skull: Scalp soft tissues within normal limits.  Calvarium intact. Sinuses/Orbits: Globes and orbital soft tissues within normal limits. Changes of chronic right maxillary sinusitis again noted. Mastoid air cells are clear. Other: None. Review of the MIP images confirms the above findings CTA NECK FINDINGS Aortic arch: Visualized aortic arch within normal limits for caliber. Bovine branching pattern noted. Mild aortic atherosclerosis. No significant stenosis about the origin of the great vessels. Right carotid system: Origin of the right CCA not well seen due to streak artifact from adjacent venous contamination. Visualized right common and internal carotid arteries or otherwise patent without stenosis or dissection. Left carotid system: Left common and internal carotid arteries are patent without dissection. Minor atheromatous change about the left carotid bulb without stenosis. Vertebral arteries: Both vertebral arteries arise from subclavian arteries. No proximal subclavian artery stenosis. Vertebral arteries are patent without stenosis or dissection. Skeleton: No discrete or worrisome osseous lesions. Moderate spondylosis present at C5-6 and C6-7. Patient is edentulous. Other neck: No other acute finding. 3 mm nodular lesion seen arising from the right true vocal cord (series 9, image 268), indeterminate. Upper chest: Emphysema.  No other acute finding. Review of the MIP images confirms the above findings CTA HEAD FINDINGS Anterior circulation: Mild atheromatous change about the carotid siphons without hemodynamically  significant stenosis. A1 segments patent bilaterally. Normal anterior communicating artery complex. Anterior cerebral arteries patent without stenosis. No M1 stenosis or occlusion. Distal MCA branches perfused and symmetric. Posterior circulation: Both V4 segments patent without stenosis. Both PICA patent. Basilar patent without stenosis superior cerebellar and posterior cerebral arteries widely patent bilaterally. Venous sinuses: Patent allowing for timing the contrast bolus. Anatomic variants: None significant.  No aneurysm. Review of the MIP images confirms the above findings IMPRESSION: CT HEAD: 1. No acute intracranial abnormality. Previously identified punctate right occipital infarct not visible by CT. 2. Mild-to-moderate chronic microvascular ischemic disease. CTA HEAD AND NECK: 1. Negative CTA for large vessel occlusion or other emergent finding. 2. Mild atheromatous change about the carotid siphons and left carotid bulb without hemodynamically significant stenosis. 3. 3 mm nodular lesion arising from the right true vocal cord, indeterminate. ENT consultation and referral for direct visualization recommended. 4. Aortic Atherosclerosis (ICD10-I70.0) and Emphysema (ICD10-J43.9). Electronically Signed   By: Rise Mu M.D.   On: 07/21/2023 04:10   DG FL GUIDED LUMBAR PUNCTURE Result Date: 07/20/2023 CLINICAL DATA:  72 year old male presents with confusion, headache, and neck stiffness concerning for meningitis. Request for fluoro guided lumbar puncture. EXAM: LUMBAR PUNCTURE UNDER FLUOROSCOPY PROCEDURE: Informed consent was obtained from the patient's granddaughter and spouse Mrs. Collie Siad prior to the procedure, including potential risks and complications of headache, allergy, bleeding, infection, spinal hematoma which may need surgery, and persistence CFS leak which may require additional procedure. Time-out was performed to verify correct patient incorrect procedure. Allergies were  reviewed. An appropriate skin entry site was determined fluoroscopically. Operator donned sterile gloves and mask. Skin site was marked, then prepped with Betadine, draped in usual sterile fashion, and infiltrated  locally with 1% lidocaine. A 20 gauge spinal needle advanced into the thecal sac at L2-L3 from a paramedial interlaminar approach. Clear colorless CSF spontaneously returned, with opening pressure of 10 cm water. 9 ml CSF were collected and divided among 4 sterile vials for the requested laboratory studies. Blood-tinged CSF was noted in vial number 3. The needle was then removed, sterile dressing was placed. The patient tolerated the procedure well and there were no complications. FLUOROSCOPY: Radiation Exposure Index (as provided by the fluoroscopic device): 2.8 mGy Kerma IMPRESSION: Technically successful lumbar puncture under fluoroscopic guidance. This exam was performed by Lawernce Ion, PA-C, and was supervised and interpreted by Myles Rosenthal, MD. Electronically Signed   By: Danae Orleans M.D.   On: 07/20/2023 14:48   ECHOCARDIOGRAM COMPLETE Result Date: 07/19/2023    ECHOCARDIOGRAM REPORT   Patient Name:   Ernest Campbell Date of Exam: 07/19/2023 Medical Rec #:  [id number]        Height:       67.0 in Accession #:    [id number]       Weight:       140.0 lb Date of Birth:  April 27, 1951         BSA:          1.738 m Patient Age:    72 years         BP:           118/59 mmHg Patient Gender: M                HR:           80 bpm. Exam Location:  Inpatient Procedure: 2D Echo Indications:    Stroke  History:        Patient has no prior history of Echocardiogram examinations.                 Stroke; Signs/Symptoms:Altered Mental Status and Chest Pain.  Sonographer:    Sheralyn Boatman RDCS Referring Phys: [phone] CLAUDIA CLAIBORNE  Sonographer Comments: Image acquisition challenging due to uncooperative patient. Patient pushed probe away and put arms across chest. Exam ended. IMPRESSIONS  1. Incomplete echocardiogram due  to patient preference. Only one diagnostic image obtained.  2. Left ventricular ejection fraction, by estimation, is 55 to 60%. The left ventricle has normal function.  3. Right ventricular systolic function is normal. The right ventricular size is normal. FINDINGS  Left Ventricle: Left ventricular ejection fraction, by estimation, is 55 to 60%. The left ventricle has normal function. There is no left ventricular hypertrophy. Right Ventricle: The right ventricular size is normal. Right ventricular systolic function is normal. Aorta: The aortic root is normal in size and structure.  LEFT VENTRICLE PLAX 2D LVIDd:         3.90 cm LVIDs:         2.60 cm LV PW:         0.80 cm LV IVS:        0.90 cm  LEFT ATRIUM         Index LA diam:    2.90 cm 1.67 cm/m   AORTA Ao Root diam: 3.10 cm Weston Brass MD Electronically signed by Weston Brass MD Signature Date/Time: 07/19/2023/4:51:32 PM    Final    VAS US CAROTID (at [facility] and [facility] only) Result Date: 07/19/2023 Carotid Arterial Duplex Study Patient Name:  HASNAIN MEITZ  Date of Exam:   07/19/2023 Medical Rec #: [id number]  Accession #:    [id number] Date of Birth: [birth year]          Patient Gender: M Patient Age:   56 years Exam Location:  [facility] Procedure:      VAS US CAROTID Referring Phys: Buena Irish --------------------------------------------------------------------------------  Indications:       CVA. Risk Factors:      None. Limitations        Today's exam was limited due to the patient's inability or                    unwillingness to cooperate, the patient's respiratory                    variation and patient constant movement. Comparison Study:  No prior studies. Performing Technologist: Chanda Busing RVT  Examination Guidelines: A complete evaluation includes B-mode imaging, spectral Doppler, color Doppler, and power Doppler as needed of all accessible portions of each vessel. Bilateral testing is considered an integral part  of a complete examination. Limited examinations for reoccurring indications may be performed as noted.  Right Carotid Findings: +----------+--------+--------+--------+-----------------------+--------+           PSV cm/sEDV cm/sStenosisPlaque Description     Comments +----------+--------+--------+--------+-----------------------+--------+ CCA Prox  105     17              smooth and heterogenous         +----------+--------+--------+--------+-----------------------+--------+ CCA Distal115     28              smooth and heterogenous         +----------+--------+--------+--------+-----------------------+--------+ ICA Prox  105     24              smooth and heterogenous         +----------+--------+--------+--------+-----------------------+--------+ ICA Mid   94      31                                              +----------+--------+--------+--------+-----------------------+--------+ ICA Distal60      18                                              +----------+--------+--------+--------+-----------------------+--------+ ECA       151     14                                              +----------+--------+--------+--------+-----------------------+--------+ +----------+--------+-------+--------+-------------------+           PSV cm/sEDV cmsDescribeArm Pressure (mmHG) +----------+--------+-------+--------+-------------------+ [id number]                                        +----------+--------+-------+--------+-------------------+ +---------+--------+--+--------+--+---------+ VertebralPSV cm/s77EDV cm/s16Antegrade +---------+--------+--+--------+--+---------+  Left Carotid Findings: +----------+--------+--------+--------+-----------------------+--------+           PSV cm/sEDV cm/sStenosisPlaque Description     Comments +----------+--------+--------+--------+-----------------------+--------+ CCA Prox  152     27              smooth and  heterogenous         +----------+--------+--------+--------+-----------------------+--------+  CCA Distal93      27              smooth and heterogenous         +----------+--------+--------+--------+-----------------------+--------+ ICA Prox  76      23              smooth and heterogenous         +----------+--------+--------+--------+-----------------------+--------+ ICA Mid   67      18                                              +----------+--------+--------+--------+-----------------------+--------+ ICA Distal101     26                                              +----------+--------+--------+--------+-----------------------+--------+ ECA       167     22                                              +----------+--------+--------+--------+-----------------------+--------+ +----------+--------+--------+--------+-------------------+           PSV cm/sEDV cm/sDescribeArm Pressure (mmHG) +----------+--------+--------+--------+-------------------+ Subclavian175                                         +----------+--------+--------+--------+-------------------+ +---------+--------+--+--------+--+---------+ VertebralPSV cm/s41EDV cm/s11Antegrade +---------+--------+--+--------+--+---------+   Summary: Right Carotid: Velocities in the right ICA are consistent with a 1-39% stenosis. Left Carotid: Velocities in the left ICA are consistent with a 1-39% stenosis. Vertebrals: Bilateral vertebral arteries demonstrate antegrade flow. *See table(s) above for measurements and observations.     Preliminary    EEG adult Result Date: 07/19/2023 Charlsie Quest, MD     07/19/2023 12:30 PM Patient Name: NOVAH TORRUELLA MRN: [id number] Epilepsy Attending: Charlsie Quest Referring Physician/Provider: Caryl Pina, MD Date: 07/19/2023 Duration: 23.34 mins Patient history: 72 yo M presented on a bad headache that started the morning he presented to the ED, in conjunction  with blurred vision, diplopia and acute confusion. EEG to evaluate for seizure Level of alertness: Awake AEDs during EEG study: None Technical aspects: This EEG study was done with scalp electrodes positioned according to the 10-20 International system of electrode placement. Electrical activity was reviewed with band pass filter of 1-70Hz , sensitivity of 7 uV/mm, display speed of 84mm/sec with a 60Hz  notched filter applied as appropriate. EEG data were recorded continuously and digitally stored.  Video monitoring was available and reviewed as appropriate. Description: The posterior dominant rhythm consists of 7 Hz activity of moderate voltage (25-35 uV) seen predominantly in posterior head regions, symmetric and reactive to eye opening and eye closing. EEG showed continuous generalized 5 to 7 Hz theta slowing. Hyperventilation and photic stimulation were not performed.   ABNORMALITY - Continuous slow, generalized IMPRESSION: This study is suggestive of mild diffuse encephalopathy. No seizures or epileptiform discharges were seen throughout the recording. Priyanka O Yadav     LOS: 3 days   Total time spent in review of labs and imaging, patient evaluation, formulation of plan, documentation and communication with family:  35 minutes  Lanae Boast, MD [facility]  07/21/2023, 8:58 AM

## 2023-07-21 NOTE — Progress Notes (Addendum)
NEUROLOGY CONSULT FOLLOW UP NOTE   Date of service: July 21, 2023 Patient Name: Ernest Campbell MRN:  [id number] DOB:  1951-03-08  Brief HPI  Ernest Campbell is a 72 y.o. 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   He was given fentanyl, Benadryl, lorazepam, Reglan. While in the emergency department, his confusion became markedly worse.   MRI revealed an incidental punctate area of diffusion abnormality, but on evaluation by neurology there was concern for neck stiffness. He was therefore started on empiric meningitic antibiotic coverage.  He has a history of difficulty walking, but had marked improvement when started on Sinemet.  It was noted by Dr. Vickey Huger that he was complaining of visual hallucinations at night and sundowning.  There was concern for Lewy body dementia versus atypical parkinsonism.  He has not seen her since 2023.  MoCA in June 23 was 22.   Interval Hx/subjective   Wife is at the bedside. He is awake and alert in NAD. Mental status is much improved today. No new neurological events overnight. He is hoping to go hope today.  LP was done yesterday, CSF fluid unremarkable   Vitals   Vitals:   07/21/23 0040 07/21/23 0400 07/21/23 0437 07/21/23 0756  BP: (!) 121/57 123/64  (!) 131/59  Pulse: 76 62  68  Resp: 17   17  Temp: 98.5 F (36.9 C) 98.1 F (36.7 C)  (!) 97.3 F (36.3 C)  TempSrc: Axillary Axillary  Oral  SpO2: 99% (!) 83% 99% 100%  Weight:      Height:         Body mass index is 21.93 kg/m.  Physical Exam   Constitutional: Appears well-developed and well-nourished.  Psych: Affect appropriate to situation.  Eyes: No scleral injection.  HENT: No OP obstrucion.  Head: Normocephalic.  Cardiovascular: Normal rate and regular rhythm.  Respiratory: Effort normal, non-labored  breathing.  GI: Soft.  No distension. There is no tenderness.  Skin: WDI.   Neurologic Examination   He is awake and alert, oriented to self, age and month. He states the year as "2023" and place "[address] Calm and cooperative, following commands. PERRL, EOMI, visual fields full to finger count, no facial droop, moves all extremities spontaneous and antigravity. Sensation intact bilaterally, no ataxia, gait not tested  Labs and Diagnostic Imaging   CBC:  Recent Labs  Lab 07/18/23 1523 07/18/23 1528 07/20/23 0843 07/21/23 0722  WBC 9.9  --  8.5 7.9  NEUTROABS 6.8  --   --   --   HGB 14.1   < > 13.1 13.5  HCT 41.1   < > 38.0* 40.0  MCV 97.2  --  94.3 96.4  PLT 239  --  216 225   < > = values in this interval not displayed.    Basic Metabolic Panel:  Lab Results  Component Value Date   NA 137 07/21/2023   K 4.0 07/21/2023   CO2 18 (L) 07/21/2023   GLUCOSE 98 07/21/2023   BUN 9 07/21/2023   CREATININE 1.19 07/21/2023   CALCIUM 8.7 (L) 07/21/2023   GFRNONAA >60 07/21/2023   Lipid Panel:  Lab Results  Component Value Date   LDLCALC 128 (H) 07/19/2023   HgbA1c:  Lab Results  Component Value Date   HGBA1C 5.8 (H) 07/19/2023  Urine Drug Screen: No results found for: "LABOPIA", "COCAINSCRNUR", "LABBENZ", "AMPHETMU", "THCU", "LABBARB"  Alcohol Level     Component Value Date/Time   ETH <10 07/18/2023 1523   INR  Lab Results  Component Value Date   INR 1.0 07/18/2023   APTT  Lab Results  Component Value Date   APTT 34 07/18/2023   AED levels: No results found for: "PHENYTOIN", "ZONISAMIDE", "LAMOTRIGINE", "LEVETIRACETA"   CSF fluid: protein 52, glucose 59, WBC 3, RBC 660, culture with gram stain pending   CT Head without contrast(Personally reviewed) 12/12  No acute process    CT angio Head and Neck with contrast(Personally reviewed): No LVO    MRI Brain(Personally reviewed): Punctate 4 mm acute ischemic nonhemorrhagic right PCA distribution  infarct involving the right occipital pole.  2d echo 12/13: EF 55-60%  Carotid US: negative   rEEG 12/13:  This study is suggestive of mild diffuse encephalopathy. No seizures or epileptiform discharges were seen throughout the recording.    Impression   Ernest Campbell is a 72 y.o. male  who presented with headache and confusion and has had marked improvement on antibiotics. He was given several deliriogenic medications (Benadryl and fentanyl) and treatment of his headache and therefore I do think it is slightly unclear whether his worsening mental status in the [facility], ED was true worsening versus medication effect. LP was unremarkable.   Recommendations  - Delirium precautions  - Continue home Sinemet - Avoid neuroleptics including Reglan - Discontinue abx as CSF is not suggestive of meningitis  - continue ASA - statin for LDL goal < 70 - PT/OT - neurology will sign off. Please call with questions or concerns   ______________________________________________________________________   Thank you for the opportunity to take part in the care of this patient. If you have any further questions, please contact the neurology consultation team on call. Updated oncall schedule is listed on AMION.  Signed,  Gevena Mart DNP, ACNPC-AG  [facility]

## 2023-07-21 NOTE — Discharge Summary (Signed)
Physician Discharge Summary  Ernest Campbell ZOX:[id number] DOB: Dec 29, 1950 DOA: 07/18/2023  PCP: Margot Ables, MD (Inactive)  Admit date: 07/18/2023 Discharge date: 07/21/2023 Recommendations for Outpatient Follow-up:  Follow up with PCP in 1 weeks-call for appointment Please obtain BMP/CBC in one week.  Discharge Dispo: Home Discharge Condition: Stable Code Status:   Code Status: Full Code Diet recommendation:  Diet Order             Diet regular Room service appropriate? 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  He has made a full recovery from those injuries and was active and alert and back to baseline.Marland Kitchen He iwoke up with a severe headache, does have a history of cluster headaches so it seemed not out of the ordinary.  They were able to get rid of the headache but around 1130 the patient started to have a headache again which is very unusual.  He also complained of some blurry vision and later double vision.  The wife said she heard some banging and went to the bathroom and the patient was in the bathroom and could not figure out how to get out of the bathroom.  None of this behavior is baseline.  He usually has some coffee while sitting in the recliner.  She found him laying on the floor on the table in front of the recliner. The patient said he was not able to see the table.  He also needed help getting dressed he could not figure out how to put his shirt on himself.  None of that is baseline.  The patient's wife called the doctor and was instructed to bring him to the ER right away.  The patient never had any difficulty speaking or eating or swallowing or walking.  He walked independently into the ER.  He it was the headache and double vision and the behavior that  was her primary concern. In the ED: Vitals fairly stable afebrile Unremarkable labs CT scan of the head which was unremarkable. MRI which revealed an acute nonhemorrhagic right PCA stroke. Neurology was consulted and admitted for further management Later in the emergency department the patient developed another headache.  He did receive Benadryl and fentanyl and Reglan.  And later received Ativan but the patient got very agitated and more confused after these medications.  He was not able to lay still to have the CTA that was ordered. Seen by neurology placed on empiric antibiotics for meningitis 12/14: Underwent LP,  CT angio head and neck- no LVO   Discharge Diagnoses:  Principal Problem:   CVA (cerebral vascular accident) (HCC) Active Problems:   Altered mental status  Complete stroke workup PT OT neurology eval So far LDL 128 goal <70,A1c 5.8 goal TTE- limited but EF 55-60%. Carotid duplex ok, CTAngio h/neck NO lvo Cont PT OT SLP, Telemetry monitoring and frequent neuro checks. Continue aspirin 81, started on Lipitor.   Acute encephalopathy with agitation: ?Etiology.  Multifactorial likely multifactorial, patient received multiple deliriogenic  MEDS- Benedryl fentanyl benzo for his headache.CRP 0.5.UA unremarkable, CXR 12/12 no pneumonia. Now he is aaox3 Seen by neurology-?  Meningitis-empiric acyclovir/ampicillin/ceftriaxone/vancomycin started  S/P  LP:12/14-WBC 3, glucose 59 protein 52, Gram stain no organisms  seen culture pending.  Await further input from neurology-less likely meningitis antibiotics stopped  History of TBI Cluster headache: Continue home Sinemet, pain management, Arice  Consults: Neuro Subjective: Aaox3, wants to go home  Discharge Exam: Vitals:   07/21/23 0756 07/21/23 1149  BP: (!) 131/59 122/71  Pulse: 68 73  Resp: 17 17  Temp: (!) 97.3 F (36.3 C) 97.6 F (36.4 C)  SpO2: 100% 99%   General: Pt is alert, awake, not in acute  distress Cardiovascular: RRR, S1/S2 +, no rubs, no gallops Respiratory: CTA bilaterally, no wheezing, no rhonchi Abdominal: Soft, NT, ND, bowel sounds + Extremities: no edema, no cyanosis  Discharge Instructions  Discharge Instructions     Discharge instructions   Complete by: As directed    Please call call MD or return to ER for similar or worsening recurring problem that brought you to hospital or if any fever,nausea/vomiting,abdominal pain, uncontrolled pain, chest pain,  shortness of breath or any other alarming symptoms.  Please follow-up your doctor as instructed in a week time and call the office for appointment.  Please avoid alcohol, smoking, or any other illicit substance and maintain healthy habits including taking your regular medications as prescribed.  You were cared for by a hospitalist during your hospital stay. If you have any questions about your discharge medications or the care you received while you were in the hospital after you are discharged, you can call the unit and ask to speak with the hospitalist on call if the hospitalist that took care of you is not available.  Once you are discharged, your primary care physician will handle any further medical issues. Please note that NO REFILLS for any discharge medications will be authorized once you are discharged, as it is imperative that you return to your primary care physician (or establish a relationship with a primary care physician if you do not have one) for your aftercare needs so that they can reassess your need for medications and monitor your lab values   Increase activity slowly   Complete by: As directed       Allergies as of 07/21/2023       Reactions   Other Other (See Comments)   According to the patient, all opioids cause hallucinations   Prednisone Other (See Comments)   Hallucinations   Acetaminophen    Hallucinations   Codeine Other (See Comments)   Hallucinations   Morphine Other (See  Comments)   Hallucinations   Oxycodone Other (See Comments)   Hallucinations        Medication List     TAKE these medications    aspirin EC 81 MG tablet Take 1 tablet (81 mg total) by mouth daily. Swallow whole. Start taking on: July 22, 2023   atorvastatin 40 MG tablet Commonly known as: LIPITOR Take 1 tablet (40 mg total) by mouth daily. Start taking on: July 22, 2023   carbidopa-levodopa 25-100 MG tablet Commonly known as: SINEMET IR Take 1 tablet by mouth 3 (three) times daily. What changed: when to take this   donepezil 5 MG tablet Commonly known as: ARICEPT Start taking 1 tablet at bedtime for 1 month, then take 2 tablets at bedtime   GOODY HEADACHE PO Take 1 packet by mouth 2 (two) times daily as needed (for headaches).   ibuprofen 200 MG tablet Commonly known as: ADVIL Take 600 mg by mouth 2 (two) times daily as needed (for back pain).        Follow-up Information  Margot Ables, MD Follow up in 1 week(s).   Specialty: Family Medicine Contact information: [address] [place] [address] [phone]                Allergies  Allergen Reactions   Other Other (See Comments)    According to the patient, all opioids cause hallucinations   Prednisone Other (See Comments)    Hallucinations    Acetaminophen     Hallucinations   Codeine Other (See Comments)    Hallucinations   Morphine Other (See Comments)    Hallucinations   Oxycodone Other (See Comments)    Hallucinations    The results of significant diagnostics from this hospitalization (including imaging, microbiology, ancillary and laboratory) are listed below for reference.    Microbiology: Recent Results (from the past 240 hours)  CSF culture w Gram Stain     Status: None (Preliminary result)   Collection Time: 07/20/23  1:55 PM   Specimen: PATH Cytology CSF; Cerebrospinal Fluid  Result Value Ref Range Status   Specimen Description CSF  Final    Special Requests NONE  Final   Gram Stain   Final    WBC PRESENT, PREDOMINANTLY PMN NO ORGANISMS SEEN CYTOSPIN SMEAR    Culture   Final    NO GROWTH < 24 HOURS Performed at [facility], [address] [place], [place] [address]    Report Status PENDING  Incomplete    Procedures/Studies: CT ANGIO HEAD NECK W WO CM Result Date: 07/21/2023 CLINICAL DATA:  Follow-up examination for stroke, diplopia. EXAM: CT ANGIOGRAPHY HEAD AND NECK WITH AND WITHOUT CONTRAST TECHNIQUE: Multidetector CT imaging of the head and neck was performed using the standard protocol during bolus administration of intravenous contrast. Multiplanar CT image reconstructions and MIPs were obtained to evaluate the vascular anatomy. Carotid stenosis measurements (when applicable) are obtained utilizing NASCET criteria, using the distal internal carotid diameter as the denominator. RADIATION DOSE REDUCTION: This exam was performed according to the departmental dose-optimization program which includes automated exposure control, adjustment of the mA and/or kV according to patient size and/or use of iterative reconstruction technique. CONTRAST:  75mL OMNIPAQUE IOHEXOL 350 MG/ML SOLN COMPARISON:  Comparison made with prior CT and MRI from 07/18/2023. FINDINGS: CT HEAD FINDINGS Brain: Cerebral volume within normal limits. Mild-to-moderate chronic microvascular ischemic disease noted. Previously identified punctate right occipital infarct not visible by CT. No other acute large vessel territory infarct. No acute intracranial hemorrhage. No mass lesion, midline shift or mass effect. No hydrocephalus or extra-axial fluid collection. Vascular: No abnormal hyperdense vessel. Scattered vascular calcifications noted within the carotid siphons. Skull: Scalp soft tissues within normal limits.  Calvarium intact. Sinuses/Orbits: Globes and orbital soft tissues within normal limits. Changes of chronic right maxillary sinusitis again noted.  Mastoid air cells are clear. Other: None. Review of the MIP images confirms the above findings CTA NECK FINDINGS Aortic arch: Visualized aortic arch within normal limits for caliber. Bovine branching pattern noted. Mild aortic atherosclerosis. No significant stenosis about the origin of the great vessels. Right carotid system: Origin of the right CCA not well seen due to streak artifact from adjacent venous contamination. Visualized right common and internal carotid arteries or otherwise patent without stenosis or dissection. Left carotid system: Left common and internal carotid arteries are patent without dissection. Minor atheromatous change about the left carotid bulb without stenosis. Vertebral arteries: Both vertebral arteries arise from subclavian arteries. No proximal subclavian artery stenosis. Vertebral arteries are patent without stenosis or dissection.  Skeleton: No discrete or worrisome osseous lesions. Moderate spondylosis present at C5-6 and C6-7. Patient is edentulous. Other neck: No other acute finding. 3 mm nodular lesion seen arising from the right true vocal cord (series 9, image 268), indeterminate. Upper chest: Emphysema.  No other acute finding. Review of the MIP images confirms the above findings CTA HEAD FINDINGS Anterior circulation: Mild atheromatous change about the carotid siphons without hemodynamically significant stenosis. A1 segments patent bilaterally. Normal anterior communicating artery complex. Anterior cerebral arteries patent without stenosis. No M1 stenosis or occlusion. Distal MCA branches perfused and symmetric. Posterior circulation: Both V4 segments patent without stenosis. Both PICA patent. Basilar patent without stenosis superior cerebellar and posterior cerebral arteries widely patent bilaterally. Venous sinuses: Patent allowing for timing the contrast bolus. Anatomic variants: None significant.  No aneurysm. Review of the MIP images confirms the above findings  IMPRESSION: CT HEAD: 1. No acute intracranial abnormality. Previously identified punctate right occipital infarct not visible by CT. 2. Mild-to-moderate chronic microvascular ischemic disease. CTA HEAD AND NECK: 1. Negative CTA for large vessel occlusion or other emergent finding. 2. Mild atheromatous change about the carotid siphons and left carotid bulb without hemodynamically significant stenosis. 3. 3 mm nodular lesion arising from the right true vocal cord, indeterminate. ENT consultation and referral for direct visualization recommended. 4. Aortic Atherosclerosis (ICD10-I70.0) and Emphysema (ICD10-J43.9). Electronically Signed   By: Rise Mu M.D.   On: 07/21/2023 04:10   DG FL GUIDED LUMBAR PUNCTURE Result Date: 07/20/2023 CLINICAL DATA:  72 year old male presents with confusion, headache, and neck stiffness concerning for meningitis. Request for fluoro guided lumbar puncture. EXAM: LUMBAR PUNCTURE UNDER FLUOROSCOPY PROCEDURE: Informed consent was obtained from the patient's granddaughter and spouse Mrs. Collie Siad prior to the procedure, including potential risks and complications of headache, allergy, bleeding, infection, spinal hematoma which may need surgery, and persistence CFS leak which may require additional procedure. Time-out was performed to verify correct patient incorrect procedure. Allergies were reviewed. An appropriate skin entry site was determined fluoroscopically. Operator donned sterile gloves and mask. Skin site was marked, then prepped with Betadine, draped in usual sterile fashion, and infiltrated locally with 1% lidocaine. A 20 gauge spinal needle advanced into the thecal sac at L2-L3 from a paramedial interlaminar approach. Clear colorless CSF spontaneously returned, with opening pressure of 10 cm water. 9 ml CSF were collected and divided among 4 sterile vials for the requested laboratory studies. Blood-tinged CSF was noted in vial number 3. The needle was then  removed, sterile dressing was placed. The patient tolerated the procedure well and there were no complications. FLUOROSCOPY: Radiation Exposure Index (as provided by the fluoroscopic device): 2.8 mGy Kerma IMPRESSION: Technically successful lumbar puncture under fluoroscopic guidance. This exam was performed by Lawernce Ion, PA-C, and was supervised and interpreted by Myles Rosenthal, MD. Electronically Signed   By: Danae Orleans M.D.   On: 07/20/2023 14:48   ECHOCARDIOGRAM COMPLETE Result Date: 07/19/2023    ECHOCARDIOGRAM REPORT   Patient Name:   Ernest Campbell Date of Exam: 07/19/2023 Medical Rec #:  [id number]        Height:       67.0 in Accession #:    [id number]       Weight:       140.0 lb Date of Birth:  1951/03/12         BSA:          1.738 m Patient Age:    83 years  BP:           118/59 mmHg Patient Gender: M                HR:           80 bpm. Exam Location:  Inpatient Procedure: 2D Echo Indications:    Stroke  History:        Patient has no prior history of Echocardiogram examinations.                 Stroke; Signs/Symptoms:Altered Mental Status and Chest Pain.  Sonographer:    Sheralyn Boatman RDCS Referring Phys: [phone] CLAUDIA CLAIBORNE  Sonographer Comments: Image acquisition challenging due to uncooperative patient. Patient pushed probe away and put arms across chest. Exam ended. IMPRESSIONS  1. Incomplete echocardiogram due to patient preference. Only one diagnostic image obtained.  2. Left ventricular ejection fraction, by estimation, is 55 to 60%. The left ventricle has normal function.  3. Right ventricular systolic function is normal. The right ventricular size is normal. FINDINGS  Left Ventricle: Left ventricular ejection fraction, by estimation, is 55 to 60%. The left ventricle has normal function. There is no left ventricular hypertrophy. Right Ventricle: The right ventricular size is normal. Right ventricular systolic function is normal. Aorta: The aortic root is normal in size and structure.   LEFT VENTRICLE PLAX 2D LVIDd:         3.90 cm LVIDs:         2.60 cm LV PW:         0.80 cm LV IVS:        0.90 cm  LEFT ATRIUM         Index LA diam:    2.90 cm 1.67 cm/m   AORTA Ao Root diam: 3.10 cm Weston Brass MD Electronically signed by Weston Brass MD Signature Date/Time: 07/19/2023/4:51:32 PM    Final    VAS US CAROTID (at [facility] and [facility] only) Result Date: 07/19/2023 Carotid Arterial Duplex Study Patient Name:  Ernest Campbell  Date of Exam:   07/19/2023 Medical Rec #: [id number]         Accession #:    [id number] Date of Birth: 1951/07/12          Patient Gender: M Patient Age:   36 years Exam Location:  [facility] Procedure:      VAS US CAROTID Referring Phys: Buena Irish --------------------------------------------------------------------------------  Indications:       CVA. Risk Factors:      None. Limitations        Today's exam was limited due to the patient's inability or                    unwillingness to cooperate, the patient's respiratory                    variation and patient constant movement. Comparison Study:  No prior studies. Performing Technologist: Chanda Busing RVT  Examination Guidelines: A complete evaluation includes B-mode imaging, spectral Doppler, color Doppler, and power Doppler as needed of all accessible portions of each vessel. Bilateral testing is considered an integral part of a complete examination. Limited examinations for reoccurring indications may be performed as noted.  Right Carotid Findings: +----------+--------+--------+--------+-----------------------+--------+           PSV cm/sEDV cm/sStenosisPlaque Description     Comments +----------+--------+--------+--------+-----------------------+--------+ CCA Prox  105     17  smooth and heterogenous         +----------+--------+--------+--------+-----------------------+--------+ CCA Distal115     28              smooth and heterogenous          +----------+--------+--------+--------+-----------------------+--------+ ICA Prox  105     24              smooth and heterogenous         +----------+--------+--------+--------+-----------------------+--------+ ICA Mid   94      31                                              +----------+--------+--------+--------+-----------------------+--------+ ICA Distal60      18                                              +----------+--------+--------+--------+-----------------------+--------+ ECA       151     14                                              +----------+--------+--------+--------+-----------------------+--------+ +----------+--------+-------+--------+-------------------+           PSV cm/sEDV cmsDescribeArm Pressure (mmHG) +----------+--------+-------+--------+-------------------+ [id number]                                        +----------+--------+-------+--------+-------------------+ +---------+--------+--+--------+--+---------+ VertebralPSV cm/s77EDV cm/s16Antegrade +---------+--------+--+--------+--+---------+  Left Carotid Findings: +----------+--------+--------+--------+-----------------------+--------+           PSV cm/sEDV cm/sStenosisPlaque Description     Comments +----------+--------+--------+--------+-----------------------+--------+ CCA Prox  152     27              smooth and heterogenous         +----------+--------+--------+--------+-----------------------+--------+ CCA Distal93      27              smooth and heterogenous         +----------+--------+--------+--------+-----------------------+--------+ ICA Prox  76      23              smooth and heterogenous         +----------+--------+--------+--------+-----------------------+--------+ ICA Mid   67      18                                              +----------+--------+--------+--------+-----------------------+--------+ ICA Distal101     26                                               +----------+--------+--------+--------+-----------------------+--------+ ECA       167     22                                              +----------+--------+--------+--------+-----------------------+--------+ +----------+--------+--------+--------+-------------------+  PSV cm/sEDV cm/sDescribeArm Pressure (mmHG) +----------+--------+--------+--------+-------------------+ Subclavian175                                         +----------+--------+--------+--------+-------------------+ +---------+--------+--+--------+--+---------+ VertebralPSV cm/s41EDV cm/s11Antegrade +---------+--------+--+--------+--+---------+   Summary: Right Carotid: Velocities in the right ICA are consistent with a 1-39% stenosis. Left Carotid: Velocities in the left ICA are consistent with a 1-39% stenosis. Vertebrals: Bilateral vertebral arteries demonstrate antegrade flow. *See table(s) above for measurements and observations.     Preliminary    EEG adult Result Date: 07/19/2023 Charlsie Quest, MD     07/19/2023 12:30 PM Patient Name: Ernest Campbell MRN: [id number] Epilepsy Attending: Charlsie Quest Referring Physician/Provider: Caryl Pina, MD Date: 07/19/2023 Duration: 23.34 mins Patient history: 72 yo M presented on a bad headache that started the morning he presented to the ED, in conjunction with blurred vision, diplopia and acute confusion. EEG to evaluate for seizure Level of alertness: Awake AEDs during EEG study: None Technical aspects: This EEG study was done with scalp electrodes positioned according to the 10-20 International system of electrode placement. Electrical activity was reviewed with band pass filter of 1-70Hz , sensitivity of 7 uV/mm, display speed of 70mm/sec with a 60Hz  notched filter applied as appropriate. EEG data were recorded continuously and digitally stored.  Video monitoring was available and reviewed as appropriate.  Description: The posterior dominant rhythm consists of 7 Hz activity of moderate voltage (25-35 uV) seen predominantly in posterior head regions, symmetric and reactive to eye opening and eye closing. EEG showed continuous generalized 5 to 7 Hz theta slowing. Hyperventilation and photic stimulation were not performed.   ABNORMALITY - Continuous slow, generalized IMPRESSION: This study is suggestive of mild diffuse encephalopathy. No seizures or epileptiform discharges were seen throughout the recording. Charlsie Quest   MR Brain Wo Contrast (neuro protocol) Result Date: 07/18/2023 CLINICAL DATA:  Initial evaluation for acute neuro deficit, stroke suspected. EXAM: MRI HEAD WITHOUT CONTRAST TECHNIQUE: Multiplanar, multiecho pulse sequences of the brain and surrounding structures were obtained without intravenous contrast. COMPARISON:  Prior CT from earlier the same day. FINDINGS: Brain: Examination mildly limited due to motion artifact. Cerebral volume within normal limits. Patchy T2/FLAIR hyperintensity seen involving the periventricular deep white matter both cerebral hemispheres, consistent with chronic small vessel ischemic disease, mild to moderate in nature. Punctate 4 mm focus of restricted diffusion seen involving the right occipital pole, consistent with a small acute ischemic infarct, right PCA distribution (series 5, image 12). No associated hemorrhage or mass effect. No other evidence for acute or subacute ischemia. Gray-white matter differentiation otherwise maintained. No areas of chronic cortical infarction. No acute or chronic intracranial blood products. No mass lesion, midline shift or mass effect. No hydrocephalus or extra-axial fluid collection. Pituitary gland and suprasellar region within normal limits. Vascular: Major intracranial vascular flow voids are maintained. Skull and upper cervical spine: Craniocervical junction within normal limits. Bone marrow signal intensity normal. No scalp  soft tissue abnormality. Sinuses/Orbits: Susceptibility artifact partially obscures the right globe. Visualized globes and orbits grossly within normal limits. Chronic right maxillary sinusitis. No mastoid effusion. Other: None. IMPRESSION: 1. Punctate 4 mm acute ischemic nonhemorrhagic right PCA distribution infarct involving the right occipital pole. 2. Underlying mild to moderate chronic microvascular ischemic disease. 3. Chronic right maxillary sinusitis. Electronically Signed   By: Rise Mu M.D.   On: 07/18/2023 19:34   DG Chest 2  View Result Date: 07/18/2023 CLINICAL DATA:  Chest pain. EXAM: CHEST - 2 VIEW COMPARISON:  None Available. FINDINGS: Clear lungs. Normal heart size and mediastinal contours. No pleural effusion or pneumothorax. Visualized bones and upper abdomen are unremarkable. IMPRESSION: No evidence of acute cardiopulmonary disease. Electronically Signed   By: Orvan Falconer M.D.   On: 07/18/2023 16:15   CT HEAD WO CONTRAST Result Date: 07/18/2023 CLINICAL DATA:  Headache, neuro deficit. EXAM: CT HEAD WITHOUT CONTRAST TECHNIQUE: Contiguous axial images were obtained from the base of the skull through the vertex without intravenous contrast. RADIATION DOSE REDUCTION: This exam was performed according to the departmental dose-optimization program which includes automated exposure control, adjustment of the mA and/or kV according to patient size and/or use of iterative reconstruction technique. COMPARISON:  Head CT 04/25/2021.  MRI brain 07/13/2021. FINDINGS: Brain: No acute hemorrhage. Unchanged mild chronic small-vessel disease. Cortical gray-white differentiation is otherwise preserved. Prominence of the ventricles and sulci within expected range for age. No hydrocephalus or extra-axial collection. No mass effect or midline shift. Vascular: No hyperdense vessel or unexpected calcification. Skull: No calvarial fracture or suspicious bone lesion. Skull base is unremarkable.  Sinuses/Orbits: Right maxillary sinus atelectasis. Orbits are unremarkable. Other: None. IMPRESSION: 1. No acute intracranial abnormality. 2. Unchanged mild chronic small-vessel disease. 3. Right maxillary sinus atelectasis. Electronically Signed   By: Orvan Falconer M.D.   On: 07/18/2023 16:14    Labs: BNP (last 3 results) No results for input(s): "BNP" in the last 8760 hours. Basic Metabolic Panel: Recent Labs  Lab 07/18/23 1523 07/18/23 1528 07/20/23 0843 07/21/23 0722  NA 137 138 139 137  K 4.0 3.9 4.2 4.0  CL 103 102 107 108  CO2 26  --  23 18*  GLUCOSE 96 95 85 98  BUN 21 20 10 9   CREATININE 0.93 1.10 1.12 1.19  CALCIUM 9.5  --  8.6* 8.7*   Liver Function Tests: Recent Labs  Lab 07/18/23 1523 07/20/23 0843 07/21/23 0722  AST 16 20 20   ALT 6 17 18   ALKPHOS 61 49 54  BILITOT 0.8 1.3* 0.9  PROT 6.6 5.3* 5.6*  ALBUMIN 4.2 3.0* 3.0*   No results for input(s): "LIPASE", "AMYLASE" in the last 168 hours. No results for input(s): "AMMONIA" in the last 168 hours. CBC: Recent Labs  Lab 07/18/23 1523 07/18/23 1528 07/20/23 0843 07/21/23 0722  WBC 9.9  --  8.5 7.9  NEUTROABS 6.8  --   --   --   HGB 14.1 14.3 13.1 13.5  HCT 41.1 42.0 38.0* 40.0  MCV 97.2  --  94.3 96.4  PLT 239  --  216 225   Cardiac Enzymes: No results for input(s): "CKTOTAL", "CKMB", "CKMBINDEX", "TROPONINI" in the last 168 hours. BNP: Invalid input(s): "POCBNP" CBG: Recent Labs  Lab 07/18/23 1508 07/18/23 1519  GLUCAP 110* 110*   D-Dimer No results for input(s): "DDIMER" in the last 72 hours. Hgb A1c Recent Labs    07/19/23 0642  HGBA1C 5.8*   Lipid Profile Recent Labs    07/19/23 0642  CHOL 189  HDL 51  LDLCALC 128*  TRIG 48  CHOLHDL 3.7   Thyroid function studies No results for input(s): "TSH", "T4TOTAL", "T3FREE", "THYROIDAB" in the last 72 hours.  Invalid input(s): "FREET3" Anemia work up No results for input(s): "VITAMINB12", "FOLATE", "FERRITIN", "TIBC", "IRON",  "RETICCTPCT" in the last 72 hours. Urinalysis    Component Value Date/Time   COLORURINE YELLOW 07/19/2023 1210   APPEARANCEUR CLEAR 07/19/2023 1210  LABSPEC 1.014 07/19/2023 1210   PHURINE 5.0 07/19/2023 1210   GLUCOSEU NEGATIVE 07/19/2023 1210   HGBUR NEGATIVE 07/19/2023 1210   BILIRUBINUR NEGATIVE 07/19/2023 1210   KETONESUR NEGATIVE 07/19/2023 1210   PROTEINUR NEGATIVE 07/19/2023 1210   NITRITE NEGATIVE 07/19/2023 1210   LEUKOCYTESUR NEGATIVE 07/19/2023 1210   Sepsis Labs Recent Labs  Lab 07/18/23 1523 07/20/23 0843 07/21/23 0722  WBC 9.9 8.5 7.9   Microbiology Recent Results (from the past 240 hours)  CSF culture w Gram Stain     Status: None (Preliminary result)   Collection Time: 07/20/23  1:55 PM   Specimen: PATH Cytology CSF; Cerebrospinal Fluid  Result Value Ref Range Status   Specimen Description CSF  Final   Special Requests NONE  Final   Gram Stain   Final    WBC PRESENT, PREDOMINANTLY PMN NO ORGANISMS SEEN CYTOSPIN SMEAR    Culture   Final    NO GROWTH < 24 HOURS Performed at [facility], [address] [place], [place] [address]    Report Status PENDING  Incomplete     Time coordinating discharge: 25 minutes  SIGNED: Lanae Boast, MD  [facility] 07/21/2023, 2:57 PM  If 7PM-7AM, please contact night-coverage [email or web address]

## 2023-07-21 NOTE — Plan of Care (Signed)
  Problem: Nutrition: Goal: Risk of aspiration will decrease Outcome: Progressing Goal: Dietary intake will improve Outcome: Progressing   Problem: Clinical Measurements: Goal: Ability to maintain clinical measurements within normal limits will improve Outcome: Progressing Goal: Will remain free from infection Outcome: Progressing Goal: Respiratory complications will improve Outcome: Progressing   Problem: Elimination: Goal: Will not experience complications related to bowel motility Outcome: Progressing Goal: Will not experience complications related to urinary retention Outcome: Progressing   Problem: Skin Integrity: Goal: Risk for impaired skin integrity will decrease Outcome: Progressing

## 2023-07-21 NOTE — Plan of Care (Signed)
  Problem: Education: Goal: Knowledge of disease or condition will improve Outcome: Adequate for Discharge Goal: Knowledge of secondary prevention will improve (MUST DOCUMENT ALL) Outcome: Adequate for Discharge Goal: Knowledge of patient specific risk factors will improve Loraine Leriche N/A or DELETE if not current risk factor) Outcome: Adequate for Discharge   Problem: Ischemic Stroke/TIA Tissue Perfusion: Goal: Complications of ischemic stroke/TIA will be minimized Outcome: Adequate for Discharge   Problem: Coping: Goal: Will verbalize positive feelings about self Outcome: Adequate for Discharge Goal: Will identify appropriate support needs Outcome: Adequate for Discharge   Problem: Health Behavior/Discharge Planning: Goal: Ability to manage health-related needs will improve Outcome: Adequate for Discharge Goal: Goals will be collaboratively established with patient/family Outcome: Adequate for Discharge   Problem: Self-Care: Goal: Ability to participate in self-care as condition permits will improve Outcome: Adequate for Discharge Goal: Verbalization of feelings and concerns over difficulty with self-care will improve Outcome: Adequate for Discharge Goal: Ability to communicate needs accurately will improve Outcome: Adequate for Discharge   Problem: Nutrition: Goal: Risk of aspiration will decrease Outcome: Adequate for Discharge Goal: Dietary intake will improve Outcome: Adequate for Discharge   Problem: Education: Goal: Knowledge of General Education information will improve Description: Including pain rating scale, medication(s)/side effects and non-pharmacologic comfort measures Outcome: Adequate for Discharge   Problem: Health Behavior/Discharge Planning: Goal: Ability to manage health-related needs will improve Outcome: Adequate for Discharge   Problem: Clinical Measurements: Goal: Ability to maintain clinical measurements within normal limits will improve Outcome:  Adequate for Discharge Goal: Will remain free from infection Outcome: Adequate for Discharge Goal: Diagnostic test results will improve Outcome: Adequate for Discharge Goal: Respiratory complications will improve Outcome: Adequate for Discharge Goal: Cardiovascular complication will be avoided Outcome: Adequate for Discharge   Problem: Activity: Goal: Risk for activity intolerance will decrease Outcome: Adequate for Discharge   Problem: Nutrition: Goal: Adequate nutrition will be maintained Outcome: Adequate for Discharge   Problem: Coping: Goal: Level of anxiety will decrease Outcome: Adequate for Discharge   Problem: Elimination: Goal: Will not experience complications related to bowel motility Outcome: Adequate for Discharge Goal: Will not experience complications related to urinary retention Outcome: Adequate for Discharge   Problem: Pain Management: Goal: General experience of comfort will improve Outcome: Adequate for Discharge   Problem: Safety: Goal: Ability to remain free from injury will improve Outcome: Adequate for Discharge   Problem: Skin Integrity: Goal: Risk for impaired skin integrity will decrease Outcome: Adequate for Discharge   Problem: Safety: Goal: Non-violent Restraint(s) Outcome: Adequate for Discharge

## 2023-07-23 LAB — CSF CULTURE W GRAM STAIN

## 2023-08-14 ENCOUNTER — Encounter: Payer: Self-pay | Admitting: Neurology

## 2023-08-14 ENCOUNTER — Ambulatory Visit: Payer: Medicare Other | Admitting: Neurology

## 2023-08-14 VITALS — BP 113/68 | HR 81 | Ht 66.0 in | Wt 129.0 lb

## 2023-08-14 DIAGNOSIS — M545 Low back pain, unspecified: Secondary | ICD-10-CM

## 2023-08-14 DIAGNOSIS — Z87898 Personal history of other specified conditions: Secondary | ICD-10-CM | POA: Diagnosis not present

## 2023-08-14 DIAGNOSIS — R269 Unspecified abnormalities of gait and mobility: Secondary | ICD-10-CM

## 2023-08-14 DIAGNOSIS — G5732 Lesion of lateral popliteal nerve, left lower limb: Secondary | ICD-10-CM

## 2023-08-14 DIAGNOSIS — R2689 Other abnormalities of gait and mobility: Secondary | ICD-10-CM | POA: Diagnosis not present

## 2023-08-14 DIAGNOSIS — G218 Other secondary parkinsonism: Secondary | ICD-10-CM

## 2023-08-14 DIAGNOSIS — F05 Delirium due to known physiological condition: Secondary | ICD-10-CM

## 2023-08-14 MED ORDER — CARBIDOPA-LEVODOPA 25-100 MG PO TABS: 1.0000 | ORAL_TABLET | Freq: Three times a day (TID) | ORAL | 3 refills | Status: DC

## 2023-08-14 MED ORDER — TEMAZEPAM 7.5 MG PO CAPS: 7.5000 mg | ORAL_CAPSULE | Freq: Every evening | ORAL | 0 refills | Status: DC | PRN

## 2023-08-14 MED ORDER — MEMANTINE HCL 5 MG PO TABS: 5.0000 mg | ORAL_TABLET | Freq: Two times a day (BID) | ORAL | 5 refills | Status: DC

## 2023-08-14 NOTE — Patient Instructions (Signed)
 Delirium Delirium is a state of mental confusion. It comes on quickly and causes significant changes in a person's thinking and behavior. People with delirium usually have trouble paying attention to what is going on or knowing where they are. They may become very withdrawn or very emotional and unable to sit still. They may even see or feel things that are not there (hallucinations). Delirium is a sign of a serious underlying medical condition. What are the causes? Delirium occurs when something suddenly affects the signals that the brain sends out. Brain signals can be affected by anything that puts severe stress on the body and brain and causes brain chemicals to be out of balance. The most common causes of delirium include: Infections. These may be bacterial, viral, fungal, or protozoal. Medicines. These include many over-the-counter and prescription medicines. Recreational drugs. Substance withdrawal. This occurs with sudden discontinuation of alcohol, certain medicines, or recreational drugs. Surgery and anesthesia. Sudden vascular events, such as stroke and brain hemorrhage. Other brain disorders, such as migraines, tumors, seizures, and physical head trauma. Metabolic disorders, such as kidney or liver failure. Low blood oxygen (anoxia). This may occur with lung disease, cardiac arrest, or carbon monoxide poisoning. Hormone imbalances (endocrinopathies), such as an overactive thyroid (hyperthyroidism) or underactive thyroid (hypothyroidism). Vitamin deficiencies. What increases the risk? The following factors may make someone more likely to develop this condition: Being a child. Being an older person. Living alone. Having vision loss or hearing loss. Having an existing brain disease, such as dementia. Having long-lasting (chronic) medical conditions, such as heart disease. Being hospitalized for long periods of time. What are the signs or symptoms? Delirium starts with a sudden  change in a person's thinking or behavior. Symptoms include: Not being able to stay awake (drowsiness) or pay attention. Being confused about places, time, and people. Forgetfulness. Having extreme energy levels. These may be low or high. Changes in sleep patterns. Extreme mood swings, such as sudden anger or anxiety. Focusing on things or ideas that are not important. Rambling and senseless talking. Difficulty speaking, understanding speech, or both. Hallucinations. Tremor or unsteady gait. Symptoms come and go throughout the day and are often worse at the end of the day. How is this diagnosed? People with delirium may not realize that they have the condition. Often, a family member or health care provider is the first person to notice the changes. This condition may be diagnosed based on a physical exam, health history, and tests. The health care provider will obtain a detailed history. This may include questions about: Current symptoms. Medical conditions that you have. Medicines. Drug use. The health care provider will perform a mental status test by: Asking questions to check for confusion. Watching for abnormal behavior. The health care provider may also order lab tests or additional studies to determine the cause of the delirium. How is this treated? Treatment of delirium depends on the cause and severity. Delirium usually goes away within days or weeks of treating the underlying cause. In the meantime, do not leave the person alone because he or she may accidentally cause self-harm. This condition may be treated with supportive care, such as: Increased light during the day and decreased light at night. Low noise level. Uninterrupted sleep. A regular daily schedule. Clocks and calendars to help with orientation. Familiar objects, including the person's pictures and clothing. Frequent visits from familiar family and friends. A healthy diet. Gentle exercise. In more severe  cases of delirium, medicine may be prescribed to help the  person keep calm and think more clearly. Follow these instructions at home:  Continue supportive care as told by a health care provider. Take over-the-counter and prescription medicines only as told by your health care provider. Ask a health care provider before using herbs or supplements. Do not use alcohol or illegal drugs. Keep all follow-up visits. This is important. Contact a health care provider if: Symptoms do not get better or they become worse. New symptoms of delirium develop. Caring for the person at home does not seem safe. Eating, drinking, or communicating stops. There are side effects of medicines, such as changes in sleep patterns, dizziness, weight gain, restlessness, movement changes, or tremors. Get help right away if: The person has thoughts of harming self or harming others. There are serious side effects of medicine, such as: Swelling of the face, lips, tongue, or throat. Fever, confusion, muscle spasms, or seizures. If you ever feel like a loved one may hurt himself or herself or others, or shares thoughts about taking his or her own life, get help right away. You can go to your nearest emergency department or: Call your local emergency services (911 in the [place] Call a suicide crisis helpline, such as the National Suicide Prevention Lifeline at 226 272 2977 or 988 in the [place] This is open 24 hours a day in the [place] Text the Crisis Text Line at 747-489-2732 (in the [place] Summary Delirium is a state of mental confusion. It comes on quickly and causes significant changes in a person's thinking and behavior. Delirium is a sign of a serious underlying medical condition. Certain medical conditions or a long hospital stay may increase the risk of developing delirium. Treatment of delirium involves treating the underlying cause and providing supportive treatments, such as a calm and familiar environment. This  information is not intended to replace advice given to you by your health care provider. Make sure you discuss any questions you have with your health care provider. Document Revised: 02/15/2021 Document Reviewed: 10/30/2019 Elsevier Patient Education  2024 [employer] Memantine  Tablets What is this medication? MEMANTINE  (MEM an teen) treats memory loss and confusion (dementia) in people who have Alzheimer disease. It works by improving attention, memory, and the ability to engage in daily activities. It is not a cure for dementia or Alzheimer disease. This medicine may be used for other purposes; ask your health care provider or pharmacist if you have questions. COMMON BRAND NAME(S): Namenda  What should I tell my care team before I take this medication? They need to know if you have any of these conditions: Kidney disease Liver disease Seizures Trouble passing urine An unusual or allergic reaction to memantine , other medications, foods, dyes, or preservatives Pregnant or trying to get pregnant Breast-feeding How should I use this medication? Take this medication by mouth with water . Follow the directions on the prescription label. You may take this medication with or without food. Take your doses at regular intervals. Do not take your medication more often than directed. Continue to take your medication even if you feel better. Do not stop taking except on the advice of your care team. Talk to your care team about the use of this medication in children. Special care may be needed Overdosage: If you think you have taken too much of this medicine contact a poison control center or emergency room at once. NOTE: This medicine is only for you. Do not share this medicine with others. What if I miss a dose? If you miss a dose, take  it as soon as you can. If it is almost time for your next dose, take only that dose. Do not take double or extra doses. If you do not take your medication for several  days, contact your care team. Your dose may need to be changed. What may interact with this medication? Acetazolamide Amantadine Cimetidine Dextromethorphan Dofetilide Hydrochlorothiazide Ketamine Metformin Methazolamide Quinidine Ranitidine Sodium bicarbonate Triamterene This list may not describe all possible interactions. Give your health care provider a list of all the medicines, herbs, non-prescription drugs, or dietary supplements you use. Also tell them if you smoke, drink alcohol, or use illegal drugs. Some items may interact with your medicine. What should I watch for while using this medication? Visit your care team for regular checks on your progress. Check with your care team if there is no improvement in your symptoms or if they get worse. This medication may affect your coordination, reaction time, or judgment. Do not drive or operate machinery until you know how this medication affects you. Sit up or stand slowly to reduce the risk of dizzy or fainting spells. Drinking alcohol with this medication can increase the risk of these side effects. What side effects may I notice from receiving this medication? Side effects that you should report to your care team as soon as possible: Allergic reactions--skin rash, itching, hives, swelling of the face, lips, tongue, or throat Side effects that usually do not require medical attention (report to your care team if they continue or are bothersome): Confusion Constipation Diarrhea Dizziness Headache This list may not describe all possible side effects. Call your doctor for medical advice about side effects. You may report side effects to FDA at 1-800-FDA-1088. Where should I keep my medication? Keep out of the reach of children. Store at room temperature between 15 degrees and 30 degrees C (59 degrees and 86 degrees F). Throw away any unused medication after the expiration date. NOTE: This sheet is a summary. It may not cover all  possible information. If you have questions about this medicine, talk to your doctor, pharmacist, or health care provider.  2024 Elsevier/Gold Standard (2021-08-15 00:00:00) Temazepam  Capsules What is this medication? TEMAZEPAM  (te MAZ e pam) treats insomnia. It helps you go to sleep faster and stay asleep through the night. It is often used for a short period of time. It belongs to a group of medications called benzodiazepines. This medicine may be used for other purposes; ask your health care provider or pharmacist if you have questions. COMMON BRAND NAME(S): Restoril  What should I tell my care team before I take this medication? They need to know if you have any of these conditions: Kidney disease Liver disease Lung or breathing disease Mental health condition Myasthenia gravis Parkinson's disease Porphyria Seizures or a history of seizures Substance use disorder Suicidal thoughts, plans, or attempt by you or a family member An unusual or allergic reaction to temazepam , other benzodiazepines, other medications, foods, dyes, or preservatives Pregnant or trying to get pregnant Breast-feeding How should I use this medication? Take this medication by mouth. It is only for use at bedtime. Follow the directions on the prescription label. Swallow the tablets or capsules with a drink of water . If it upsets your stomach, take it with food or milk. Do not take your medication more often than directed. Do not stop taking except on the advice of your care team. A special MedGuide will be given to you by the pharmacist with each prescription and refill. Be sure  to read this information carefully each time. Talk to your care team about the use of this medication in children. Special care may be needed. Overdosage: If you think you have taken too much of this medicine contact a poison control center or emergency room at once. NOTE: This medicine is only for you. Do not share this medicine with  others. What if I miss a dose? If you miss a dose, take it as soon as you can. If it is almost time for your next dose, take only that dose. Do not take double or extra doses. What may interact with this medication? Do not take this medication with any of the following: Opioid medications for cough Sodium oxybate This medication may also interact with the following: Alcohol Antihistamines for allergy, cough and cold Certain medications for anxiety or sleep Certain medications for depression, such as amitriptyline, fluoxetine, sertraline Certain medications for seizures, such as phenobarbital, primidone General anesthetics, such as lidocaine , pramoxine, tetracaine Medications that relax muscles for surgery Opioid medications for pain Phenothiazines, such as chlorpromazine, mesoridazine, prochlorperazine, thioridazine This list may not describe all possible interactions. Give your health care provider a list of all the medicines, herbs, non-prescription drugs, or dietary supplements you use. Also tell them if you smoke, drink alcohol, or use illegal drugs. Some items may interact with your medicine. What should I watch for while using this medication? Visit your care team for regular checks on your progress. Tell your care team if your symptoms do not start to get better or if they get worse. Do not stop taking this medication except on your care team's advice. You may develop a severe reaction. Your care team will tell you how much medication to take. Long term use of this medication may cause your brain and body to depend on it. This can happen even when used as directed by your care team. You and your care team will work together to determine how long you will need to take this medication. Plan to go to bed and stay in bed for a full night (7 to 8 hours) after you take this medication. You may still be drowsy the morning after taking this medication. This medication may affect your  coordination, reaction time, or judgment. Do not drive or operate machinery until you know how this medication affects you. Sit up or stand slowly to reduce the risk of dizzy or fainting spells. Drinking alcohol with this medication can increase the risk of these side effects. If you are taking another medication that also causes drowsiness, you may have more side effects. Give your care team a list of all medications you use. Your care team will tell you how much medication to take. Do not take more medication than directed. Get emergency help right away if you have problems breathing or unusual sleepiness. You may do unusual sleep behaviors or activities you do not remember the day after taking this medication. Activities include driving, making or eating food, talking on the phone, sexual activity, or sleep walking. Stop taking this medication and call your care team right away if you find out you have done activities like this. After you stop taking this medication, you may have trouble falling asleep. This is called rebound insomnia. This problem usually goes away on its own after 1 or 2 nights. If you or your family notice any changes in your behavior, such as new or worsening depression, thoughts of harming yourself, anxiety, other unusual or disturbing thoughts, or memory  loss, call your care team right away. Talk to your care team if you wish to become pregnant or think you might be pregnant. This medication can cause serious birth defects. Talk to your care team before breastfeeding. Changes to your treatment plan may be needed. What side effects may I notice from receiving this medication? Side effects that you should report to your care team as soon as possible: Allergic reactions--skin rash, itching, hives, swelling of the face, lips, tongue, or throat CNS depression--slow or shallow breathing, shortness of breath, feeling faint, dizziness, confusion, trouble staying awake Mood and behavior  changes--anxiety, nervousness, confusion, hallucinations, irritability, hostility, thoughts of suicide or self-harm, worsening mood, feelings of depression Unusual sleep behaviors or activities you do not remember such as driving, eating, or sexual activity Side effects that usually do not require medical attention (report to your care team if they continue or are bothersome): Dizziness Drowsiness the day after use Headache Nausea This list may not describe all possible side effects. Call your doctor for medical advice about side effects. You may report side effects to FDA at 1-800-FDA-1088. Where should I keep my medication? Keep out of the reach of children and pets. This medication can be abused. Keep your medication in a safe place to protect it from theft. Do not share this medication with anyone. Selling or giving away this medication is dangerous and against the law. This medication may cause accidental overdose and death if taken by other adults, children, or pets. Mix any unused medication with a substance like cat litter or coffee grounds. Then throw the medication away in a sealed container like a sealed bag or a coffee can with a lid. Do not use the medication after the expiration date. Store at room temperature below 30 degrees C (86 degrees F). Protect from light. Keep container tightly closed. NOTE: This sheet is a summary. It may not cover all possible information. If you have questions about this medicine, talk to your doctor, pharmacist, or health care provider.  2024 Elsevier/Gold Standard (2021-03-29 00:00:00)

## 2023-08-14 NOTE — Progress Notes (Addendum)
 Provider:  Dedra Gores, MD     Primary Care Physician:  Sallee Barks, MD (Inactive) No address on file     Referring Provider:  recent ED visit, follow up for MS/ confusion.         Chief Complaint according to patient   Patient presents with:     New problem , in an established patient with [facility] provider -  Patient (Initial Visit)           HISTORY OF PRESENT ILLNESS:  Ernest Campbell is a 73 y.o. male patient who is here for revisit 08/14/2023 for  ED follow up:  For  Delirium.   Chief concern according to patient :   The family reports the patient woke up with headaches, went to ED as he never had headaches- and when he was given a migraine  cocktail he became extremely confused,trying to grab invisible objects in the air,  shuffling gait, encephalopathy.  The family  reports ongoing confusion, no short term memory, repeated questions.  He has fallen  out of his chair. Last week he walked out of the home and went to a neighbour's house and asked them to call the police.(!) He is agitated , restless, shuffling - and in week 4 since the Delirium  became apparent. He is losing weight. Lost 10 pounds.   He had undergone gait evaluation, DDD at lumbar spine, LP to rule out NPH, or infection, cultures negative, opening at 10 cm water  pressure.   Protein elevated in CSF.  He has low protein / albumin in serum, low calcium  and no elevated glucose.  Had CT, LP and EEG- slow, diffuse. 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 2. Underlying mild to moderate chronic microvascular ischemic disease. 3. Chronic right maxillary sinusitis.  No discrete or worrisome osseous lesions. Moderate spondylosis present at C5-6 and C6-7. Patient is edentulous.   Other neck: No other acute finding. 3 mm nodular lesion seen arising from the right true vocal cord (series  9, image 268), indeterminate.   Upper chest: Emphysema.   ED NOte : Patient continues to pull lines, tele sitter alerted that patient need to be helped in bed because her was sideways with his legs hanging off.  Wife was in room, on phone not facing patient.  During bedside report, wife asked that we remove mittens because they were making the patient agitated, I told her that we can try him out of mitts if she stays at the bedside and keeps him from pulling at lines, and she agreed that she would.  Once myself and the charge nurse, Alan, placed the patient back correctly in bed, we applied the mittens because the patient's condom cath had been pulled off and he was wet, and his IV site was dripping blood from being pulled.  We then walked out of the room, and within seconds that wife came to the door, yelling at myself and the charge nurse to take those gd mittens off my husband.  We both attempted to explain that he needs the mittens to keep his lines so he can get the antibiotics he needs.  She was irate, told many times to stop yelling in the hallway.  I told her that she is impeding the care of the patient and I would have to ask her to leave, she refused.  Security was called to the unit, they explained that because she was yelling and not following staff rules that she is being asked to leave and now trespassing.  Once she understood that one of the officers was actual [employer], she asked to stay and I said no, that she could return tomorrow and only during regular visitor hours, no staying overnight.  Patient complied with Security, gathered her belongs and left.  Patient was then found to be increasing agitated , on hands and knees in bed and attempting to hit staff as we tried to get him back in bed correctly.  Dr Dyana made aware, new orders received to include restraints.  Will continue to monitor.     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  After Dr Ines reevaluated his foot drop and back pain: he was dx with  bilateral EDB peroneal motor nerves along with normal sensory conductions suggestive of bilateral L5/S1 radiculopathy.  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 EMG needle study was however normal which could indicate that these are remote findings.   He is now receiving epidural injection through [employer], Dr.O'tuel ,MD. Here reporting improvement , better strength but not yet stamina.  He had almost not walked at all for 4 months when initially seen here.  Has been shuffling less, memory improved as well ( subjective). He takes sinemet  25/ 100 mg 90 days for tid use and that has made the most impact.     First encounter: Ernest Campbell is a 73 y.o. year old White or Caucasian male patient seen here in the presence of his spouse, upon consultation request/  referral on 07/03/2021 from PCP .   Chief concern according to patient's wife: The patient fell from a kitchen counter on which he worked backwards and hit his head so this was a quite significant traumatic impact also initially neither his personality nor his abilities for change.  Within 3 weeks or so the spells noted a decline in gait stability, some more forgetfulness and memory lapses and also a change in facial expression.  He was not himself.  The patient presentedv to his orthopedic surgeon Dr. Irving.  From there he was referred for an MRI of the brain but he could not tolerate lying flat in the loud machine and therefore had to do it under sedation at the hospital.  At the hospital there was also an MRI of the spine all 3 compartments  were reviewed cervical, lumbar and thoracic.  He was treated with steroids also I am not sure what the treated disease was.  He did have some lower back pain. 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  Rather mild canal stenosis.  Based on these findings and May 18, 2021 the patient had been started on steroids.   He was started I on steroids and became delirious. S he wouldn't sleep and would crawl on the floor, was not responding to verbal stimuli.  Review of Systems: Out of a complete 14 system review, the patient complains of only the following symptoms, and all other reviewed systems are negative.:   Akathesia, restless,delirium.   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 Strain: Low Risk  (10/20/2022)   Received from [employer], [facility]   Overall Financial Resource Strain (CARDIA)    Difficulty of Paying Living Expenses: Not hard at all  Food Insecurity: No Food Insecurity (07/19/2023)   Hunger Vital Sign    Worried About Running Out of Food in the Last Year: Never true    Ran Out of Food in the Last Year: Never true  Transportation Needs: No Transportation Needs (07/19/2023)   PRAPARE - Administrator, Civil Service (Medical): No    Lack of  Transportation (Non-Medical): No  Physical Activity: Unknown (10/20/2022)   Received from [facility], [facility]   Exercise Vital Sign    Days of Exercise per Week: Patient declined    Minutes of Exercise per Session: 30 min  Stress: Patient Declined (10/20/2022)   Received from [facility], [facility] - Occupational Stress Questionnaire    Feeling of Stress : Patient declined  Social Connections: Socially Integrated (10/20/2022)   Received from [facility], [facility]   Social Network    How would you rate your social network (family, work, friends)?: Good participation with social networks    Family History  Problem Relation Age of Onset   Dementia Mother    Parkinsonism Father    Stroke Father    Breast cancer Sister     History reviewed. No pertinent past medical history.  History reviewed. No pertinent surgical history.   Current Outpatient Medications on File Prior to Visit  Medication Sig Dispense Refill   aspirin  EC 81 MG tablet Take 1 tablet (81 mg total) by mouth daily. Swallow whole. 30 tablet 12   Aspirin -Acetaminophen -Caffeine (GOODY HEADACHE PO) Take 1 packet by mouth 2 (two) times daily as needed (for headaches).     carbidopa -levodopa  (SINEMET  IR) 25-100 MG tablet Take 1 tablet by mouth 3 (three) times daily. (Patient taking differently: Take 1 tablet by mouth 3 (three) times daily with meals.) 270 tablet 3   ibuprofen  (ADVIL ) 200 MG tablet Take 600 mg by mouth 2 (two) times daily as needed (for back pain).     atorvastatin  (LIPITOR) 40 MG tablet Take 1 tablet (40 mg total) by mouth daily. 30 tablet 0   donepezil  (ARICEPT ) 5 MG tablet Start taking 1 tablet at bedtime for 1 month, then take 2 tablets at bedtime (Patient not taking: Reported on 07/18/2023) 60 tablet 5   No current facility-administered medications on file prior to visit.  Allergies  Allergen Reactions   Other Other (See Comments)     According to the patient, all opioids cause hallucinations   Prednisone  Other (See Comments)    Hallucinations    Acetaminophen      Hallucinations   Codeine Other (See Comments)    Hallucinations   Morphine  Other (See Comments)    Hallucinations   Oxycodone Other (See Comments)    Hallucinations     DIAGNOSTIC DATA (LABS, IMAGING, TESTING) - I reviewed patient records, labs, notes, testing and imaging myself where available.  Lab Results  Component Value Date   WBC 7.9 07/21/2023   HGB 13.5 07/21/2023   HCT 40.0 07/21/2023   MCV 96.4 07/21/2023   PLT 225 07/21/2023      Component Value Date/Time   NA 137 07/21/2023 0722   NA 138 07/03/2021 1539   K 4.0 07/21/2023 0722   CL 108 07/21/2023 0722   CO2 18 (L) 07/21/2023 0722   GLUCOSE 98 07/21/2023 0722   BUN 9 07/21/2023 0722   BUN 19 07/03/2021 1539   CREATININE 1.19 07/21/2023 0722   CALCIUM  8.7 (L) 07/21/2023 0722   PROT 5.6 (L) 07/21/2023 0722   PROT 6.4 07/03/2021 1539   ALBUMIN 3.0 (L) 07/21/2023 0722   ALBUMIN 4.3 07/03/2021 1539   AST 20 07/21/2023 0722   ALT 18 07/21/2023 0722   ALKPHOS 54 07/21/2023 0722   BILITOT 0.9 07/21/2023 0722   BILITOT 0.3 07/03/2021 1539   GFRNONAA >60 07/21/2023 0722   Lab Results  Component Value Date   CHOL 189 07/19/2023   HDL 51 07/19/2023   LDLCALC 128 (H) 07/19/2023   TRIG 48 07/19/2023   CHOLHDL 3.7 07/19/2023   Lab Results  Component Value Date   HGBA1C 5.8 (H) 07/19/2023   No results found for: VITAMINB12 No results found for: TSH  PHYSICAL EXAM:  Today's Vitals   08/14/23 1021  BP: 113/68  Pulse: 81  Weight: 129 lb (58.5 kg)  Height: 5' 6 (1.676 m)   Body mass index is 20.82 kg/m.   Wt Readings from Last 3 Encounters:  08/14/23 129 lb (58.5 kg)  07/18/23 140 lb (63.5 kg)  01/31/23 140 lb 8 oz (63.7 kg)     Ht Readings from Last 3 Encounters:  08/14/23 5' 6 (1.676 m)  07/18/23 5' 7 (1.702 m)  01/31/23 5' 7 (1.702 m)      General:  The patient is awake, but not fully  alert and appears  in distress. The patient is well groomed. He is restless, he has lost weight.  Dysphonia.  Head: Normocephalic, atraumatic. Neck is stooped.  Posture is stooped.   Dental status:  no biological teeth.  Cardiovascular:  Regular rate and cardiac rhythm by pulse,  without distended neck veins. Respiratory: Lungs are clear to auscultation.  Skin:  Without evidence of ankle edema, or rash. Trunk: The patient's posture is stooped NEUROLOGIC EXAM: Memory subjective described as impaired  Attention span & concentration ability appears limited  Speech is not fluent dysarthria, dysphonia or aphasia.  Mood and affect are appropriate.   Cranial nerves: no loss of smell or taste reported  Pupils are equal and briskly reactive to light. Funduscopic exam deferred. .  Extraocular movements in vertical and horizontal planes were intact and without nystagmus. No Diplopia. Visual fields by finger perimetry are intact. Hearing was intact to soft voice and finger rubbing.    Facial sensation intact to fine touch.  Facial motor strength is symmetric  and tongue and uvula move midline.  Neck ROM : rotation, tilt and flexion extension were normal for age and shoulder shrug was symmetrical.    Motor exam:  Symmetric bulk, tone and ROM.   no cogwheeling ! symmetric grip strength .   Sensory:  Fine touch,and vibration were impaired in both feet. At ankles and both knees.   Proprioception tested in the upper extremities was impaired    Coordination: Rapid alternating movements in the fingers/hands were of slowed  speed.  The Finger-to-nose maneuver was impaired with evidence of ataxia , dysmetria, right and left confusion-  and myoclonic jerks. Were seen but  no tremor.   Gait and station: Patient could rise assisted from a seated position, he walked stooped and only managed 3 steps, tiny steps- abnormal posture, .  Toe and heel walk were deferred.  Deep  tendon reflexes: in the  upper and lower extremities are symmetric and trace only.     ASSESSMENT AND PLAN 73 y.o. year old male  here with: Parkinsonism, hallucinations, agitation - delirium superimposed on underlying neurocognitive baseline impairment,     1) delirium  developed in response to being treated with a migraine cocktail - for a new acute headache onset.   The patient has suffered from cognitive impairment, parkinsonism.   I see not enough pathology on brain MRI to explain this.  EEG was abnormally slow.   Plan : Refilled sinemet , tid.  Aricept  was not tolerated, changing to Namenda .  Repeat EEG now that the medications have weaned down.  Temazepam  for night time to calm him , that's for a limited period , not chronic use of  medication   2)  this patient's  father had PD   3)  this patient's mother had dementia, unspecified form      We plan to follow up through our NP within 3-5 months. Monitoring the confusional state , medication and sun-downing.    I would like to thank Sallee Barks, MD (Inactive) and No referring provider defined for this encounter. for allowing me to meet with and to take care of this pleasant patient.   After spending a total time of  55  minutes face to face and additional time for physical and neurologic examination, review of laboratory studies,  personal review of imaging studies, reports and results of other testing and review of referral information / records as far as provided in visit,   Electronically signed by: Dedra Gores, MD 08/14/2023 10:59 AM  [facility] Board certified by The [employer] of Sleep Medicine and Diplomate of the [employer] of Sleep Medicine. Board certified In Neurology through the ABPN, Fellow of the [employer] of Neu[employer].

## 2023-08-21 ENCOUNTER — Telehealth: Payer: Self-pay | Admitting: Neurology

## 2023-08-21 NOTE — Telephone Encounter (Signed)
 Pt's wife, Johnathin Reinisch, Medication interaction with medication prescribed last visit.She thought the medication was Xanax . New medication prescribed at last office visit making  him more delirious and combative and have not slept in days. Would like a call from the nurse.  Check the patient's chart and could not find Xanax  in the chart. Ms Lemelin did not recognize the medication listed that was filled on 08/14/23

## 2023-08-22 NOTE — Telephone Encounter (Signed)
Called the wife back. She states that she tried the temazepam and it caused the delirium, confusion to worsened. She has decided to stop this medication due to the reaction to the multiple pain med,sedating medications. She is wanting to make aware that she stopped that. She states that he seems to be progressing through the phases of the delirium. States that he is some better but seems more out of it now. He is not sleeping and getting the restful  sleep needed to heal. She was asking about natural ways that could potentially help to avoid starting any new medication  that could potentially place at a higher risk of delirium worsening. I reviewed some natural ways to help with sleeping.  She also questioned about lab work being completed. It was ordered on 08/14/23 and reads in the computer as collected but doesn't appear to be released. The wife doesn't remember him getting lab work completed and wanted to know what they need to do about that. I don't see where results have posted. I have advised  I will send this information to lab tech and have her further investigate and reach out if he needs to come back. Wife was appreciative.

## 2023-08-23 ENCOUNTER — Other Ambulatory Visit: Payer: Self-pay | Admitting: Geriatric Medicine

## 2023-08-23 DIAGNOSIS — R410 Disorientation, unspecified: Secondary | ICD-10-CM

## 2023-08-23 DIAGNOSIS — S0990XA Unspecified injury of head, initial encounter: Secondary | ICD-10-CM

## 2023-08-23 DIAGNOSIS — Z87820 Personal history of traumatic brain injury: Secondary | ICD-10-CM

## 2023-08-23 DIAGNOSIS — G20A1 Parkinson's disease without dyskinesia, without mention of fluctuations: Secondary | ICD-10-CM

## 2023-08-28 ENCOUNTER — Ambulatory Visit
Admission: RE | Admit: 2023-08-28 | Discharge: 2023-08-28 | Disposition: A | Discharge status: Home / Self Care | Payer: Medicare Other | Source: Ambulatory Visit | Attending: Geriatric Medicine | Admitting: Geriatric Medicine

## 2023-08-28 ENCOUNTER — Telehealth: Payer: Self-pay | Admitting: Neurology

## 2023-08-28 DIAGNOSIS — S0990XA Unspecified injury of head, initial encounter: Secondary | ICD-10-CM

## 2023-08-28 DIAGNOSIS — G20A1 Parkinson's disease without dyskinesia, without mention of fluctuations: Secondary | ICD-10-CM

## 2023-08-28 DIAGNOSIS — Z87820 Personal history of traumatic brain injury: Secondary | ICD-10-CM

## 2023-08-28 DIAGNOSIS — R410 Disorientation, unspecified: Secondary | ICD-10-CM

## 2023-08-28 NOTE — Telephone Encounter (Signed)
Pt's wife has returned call to [facility]

## 2023-08-28 NOTE — Telephone Encounter (Signed)
Does one order a hospital bed through DME or through the PCP?

## 2023-08-28 NOTE — Telephone Encounter (Signed)
Spoke to wife (checked DPR)  Wife states   pt has increased confusion . Wife  states pt is no longer walking but crawling around the house Pt had 1 fall after last OV Wife states PCP aware has had labs drawn all normal . Pt did have CT scan waiting on results . Wife is asking for hospital bed . Pt is lying on covers on floor at night due to increased confusion . Wife states she is sleeping couch to watch pt . Wife states pt is a feeder Will forward to Dr Vickey Huger for referral for hospital bed Wife expressed understanding and thanked me for calling

## 2023-08-28 NOTE — Telephone Encounter (Signed)
Pt's wife states pt hasn't been able to walk for a week and it is hard to transport him. Asking if a order for a hospital bed can be placed. States the the pt is having a CT Scan today and would like the results to be looked over. Also had questions about bllod work that was needed. Requesting call back to discuss

## 2023-08-28 NOTE — Telephone Encounter (Signed)
LVM for call back to discuss phone message

## 2023-08-29 NOTE — Telephone Encounter (Addendum)
Spoke  to wife (checked DPR) made her aware please call PCP for order for hospital bed  Wife expressed understanding and thanked me for calling

## 2023-09-03 ENCOUNTER — Telehealth: Payer: Self-pay | Admitting: Neurology

## 2023-09-03 MED ORDER — MEMANTINE HCL 5 MG PO TABS: 5.0000 mg | ORAL_TABLET | Freq: Two times a day (BID) | ORAL | 5 refills | Status: DC

## 2023-09-03 NOTE — Telephone Encounter (Signed)
[facility]) pt said Dr. Vickey Huger had informed the patient needed blood drawn. We can draw labs when nurse goes out next week. If you would like [place] to draw labs we would need a verbal order. Also if there is a day you need labs drawn let us know. Would like a call back

## 2023-09-03 NOTE — Telephone Encounter (Signed)
Called Ernest Campbell back and provided her with the lab result that were ordered at the previous visit but was never collected. She wanted to also make Dr Dohmeier aware that the patient has not been taking his medication. He is alert and able to talk but not really communicate. He is taking some minimal food intake and drinking fluids but they are unable to get him to take medication.  Encouraged that family can try crushing meds and given in food if possible. She will educate on that.

## 2023-09-03 NOTE — Addendum Note (Signed)
Addended by: Melvyn Novas on: 09/03/2023 05:06 PM   Modules accepted: Orders

## 2023-09-09 ENCOUNTER — Other Ambulatory Visit: Payer: Medicare Other | Admitting: *Deleted

## 2023-09-13 ENCOUNTER — Emergency Department (HOSPITAL_COMMUNITY)
Admission: EM | Admit: 2023-09-13 | Discharge: 2023-09-14 | Disposition: A | Discharge status: Home / Self Care | Payer: Medicare Other | Attending: Emergency Medicine | Admitting: Emergency Medicine

## 2023-09-13 DIAGNOSIS — Z7982 Long term (current) use of aspirin: Secondary | ICD-10-CM | POA: Diagnosis not present

## 2023-09-13 DIAGNOSIS — R2689 Other abnormalities of gait and mobility: Secondary | ICD-10-CM | POA: Insufficient documentation

## 2023-09-13 DIAGNOSIS — R410 Disorientation, unspecified: Secondary | ICD-10-CM | POA: Diagnosis not present

## 2023-09-13 DIAGNOSIS — R451 Restlessness and agitation: Secondary | ICD-10-CM | POA: Insufficient documentation

## 2023-09-13 DIAGNOSIS — Z9181 History of falling: Secondary | ICD-10-CM | POA: Insufficient documentation

## 2023-09-13 DIAGNOSIS — R531 Weakness: Secondary | ICD-10-CM

## 2023-09-13 DIAGNOSIS — M6281 Muscle weakness (generalized): Secondary | ICD-10-CM | POA: Insufficient documentation

## 2023-09-13 DIAGNOSIS — R5381 Other malaise: Secondary | ICD-10-CM

## 2023-09-13 DIAGNOSIS — F4489 Other dissociative and conversion disorders: Secondary | ICD-10-CM

## 2023-09-13 LAB — CBC WITH DIFFERENTIAL/PLATELET
Abs Immature Granulocytes: 0.02 10*3/uL (ref 0.00–0.07)
Basophils Absolute: 0.1 10*3/uL (ref 0.0–0.1)
Basophils Relative: 1 %
Eosinophils Absolute: 0.1 10*3/uL (ref 0.0–0.5)
Eosinophils Relative: 1 %
HCT: 41 % (ref 39.0–52.0)
Hemoglobin: 14 g/dL (ref 13.0–17.0)
Immature Granulocytes: 0 %
Lymphocytes Relative: 39 %
Lymphs Abs: 2.3 10*3/uL (ref 0.7–4.0)
MCH: 33.2 pg (ref 26.0–34.0)
MCHC: 34.1 g/dL (ref 30.0–36.0)
MCV: 97.2 fL (ref 80.0–100.0)
Monocytes Absolute: 0.8 10*3/uL (ref 0.1–1.0)
Monocytes Relative: 13 %
Neutro Abs: 2.7 10*3/uL (ref 1.7–7.7)
Neutrophils Relative %: 46 %
Platelets: 290 10*3/uL (ref 150–400)
RBC: 4.22 MIL/uL (ref 4.22–5.81)
RDW: 13.2 % (ref 11.5–15.5)
WBC: 5.9 10*3/uL (ref 4.0–10.5)
nRBC: 0 % (ref 0.0–0.2)

## 2023-09-13 LAB — BASIC METABOLIC PANEL
Anion gap: 10 (ref 5–15)
BUN: 17 mg/dL (ref 8–23)
CO2: 22 mmol/L (ref 22–32)
Calcium: 9 mg/dL (ref 8.9–10.3)
Chloride: 107 mmol/L (ref 98–111)
Creatinine, Ser: 0.9 mg/dL (ref 0.61–1.24)
GFR, Estimated: >60 mL/min (ref >60)
Glucose, Bld: 104 mg/dL — ABNORMAL HIGH (ref 70–99)
Potassium: 3.9 mmol/L (ref 3.5–5.1)
Sodium: 139 mmol/L (ref 135–145)

## 2023-09-13 LAB — LAB REPORT - SCANNED
Free T4: 8.9 ng/dL
TSH: 1.17 (ref 0.41–5.90)

## 2023-09-13 NOTE — ED Notes (Signed)
 Pt Family stated that pt just need social work. They already have a place and they told them that they have a bed ready.  She stated that they only need him to go though the ER to get the insurance to pay for his stay.  Family member great niece [phone]. And Wife is also here.

## 2023-09-13 NOTE — ED Provider Triage Note (Signed)
 Emergency Medicine Provider Triage Evaluation Note  Ernest Campbell , a 73 y.o. male  was evaluated in triage.  Pt complains of SNF placement.  Review of Systems  Positive: Unable to gather ROS due to patient's disorientation Negative:   Physical Exam  BP 103/61   Pulse 82   Temp 98.1 F (36.7 C) (Oral)   Resp 16   SpO2 100%  Gen:   Awake, no distress, A&O x 0 Resp:  Normal effort  MSK:   Moves extremities without difficulty  Other:    Medical Decision Making  Medically screening exam initiated at 2:20 PM.  Appropriate orders placed.  Ernest Campbell was informed that the remainder of the evaluation will be completed by another provider, this initial triage assessment does not replace that evaluation, and the importance of remaining in the ED until their evaluation is complete.  General Labs ordered   Francis Ileana SAILOR, PA-C 09/13/23 1422

## 2023-09-13 NOTE — ED Notes (Signed)
 Pt brief changed, recliner provided, pillows and blankets given while pt is in WR.

## 2023-09-13 NOTE — ED Triage Notes (Signed)
 Patient from home via EMS because family would like him placed in a SNF and they are unable to take care of him at home. Patient with decreased PO intake and AMS at night. Patient disoriented x 4.

## 2023-09-14 ENCOUNTER — Emergency Department (HOSPITAL_COMMUNITY): Payer: Medicare Other

## 2023-09-14 LAB — URINALYSIS, ROUTINE W REFLEX MICROSCOPIC
Bilirubin Urine: NEGATIVE
Glucose, UA: NEGATIVE mg/dL
Hgb urine dipstick: NEGATIVE
Ketones, ur: NEGATIVE mg/dL
Leukocytes,Ua: NEGATIVE
Nitrite: NEGATIVE
Protein, ur: NEGATIVE mg/dL
Specific Gravity, Urine: 1.024 (ref 1.005–1.030)
pH: 5 (ref 5.0–8.0)

## 2023-09-14 MED ORDER — CARBIDOPA-LEVODOPA 25-100 MG PO TABS
1.0000 | ORAL_TABLET | Freq: Three times a day (TID) | ORAL | Status: DC
  Administered 2023-09-14: 1 via ORAL
  Filled 2023-09-14: qty 1

## 2023-09-14 MED ORDER — MEMANTINE HCL 10 MG PO TABS
5.0000 mg | ORAL_TABLET | Freq: Two times a day (BID) | ORAL | Status: DC
  Administered 2023-09-14: 5 mg via ORAL
  Filled 2023-09-14: qty 1

## 2023-09-14 NOTE — ED Notes (Signed)
 Spoke with nurse at [facility] and gave report

## 2023-09-14 NOTE — Progress Notes (Addendum)
 9:55am: CSW received bed offer from [facility].  CSW spoke with patient's wife Grayce to inform her of discharge plan and she is in agreement.   Patient will go to room 3221 at [facility]. Patient will travel via [facility] - RN to call when ready. The number to call for report is [phone], ext. [phone].  8:50am: CSW received consult for patient for SNF placement.   CSW spoke with Shona at [facility] who states the facility can accept patient after he is evaluated by PT. Rhonda requesting CSW complete FL2.  Niels Portugal, MSW, LCSW Transitions of Care  Clinical Social Worker II [phone]

## 2023-09-14 NOTE — ED Notes (Signed)
 Called [facility] for report spoke with receptionist she stated she would give the nurse my number and transferred me to nurse with no answer.

## 2023-09-14 NOTE — ED Notes (Signed)
EDP at [facility] speaking with pt and family.

## 2023-09-14 NOTE — ED Notes (Signed)
 [facility] called

## 2023-09-14 NOTE — ED Provider Notes (Addendum)
 Emergency Medicine Observation Re-evaluation Note  Ernest Campbell is a 73 y.o. male, seen on rounds today.  Pt initially presented to the ED for complaints of possible SNF placement. Spouse describes cognitive issues and physical deconditioning as reasons why she wishes to pursue SNF. From exam of patient and description of behaviors it appears may need memory care (vs home with supervision). Spouse indicates is seeking shorter term rehab, and indicates if became situation of longer term care or having to pay out of pocket, that she would take patient home.  She indicates is hoping to use short term SNF stay to improve strength and mobility.   Physical Exam  BP (!) 105/55 (BP Location: Left Arm)   Pulse 70   Temp 98 F (36.7 C) (Oral)   Resp 15   SpO2 100%  Physical Exam General: alert, confused. Cardiac: regular rate.  Lungs: breathing comfortably. Psych: calm. Appears confused - currently mental status described as being c/w recent baseline. Fidgety, tries to climb out of bed.   ED Course / MDM    I have reviewed the labs performed to date as well as medications administered while in observation.  Recent changes in the last 24 hours include ED obs, reassessment.   Plan    Not clear from my brief/initial contact w patient whether high probability for patient to be 'rehabable' and regain strength and mobility, and/or improvement in confusion/mental state with short term SNF stay, but may be worth effort to attempt.  As patient/spouse already waiting in ED for a period of  time awaiting  to speak with SW/TOC team, will [facility] team to assess and speak w spouse.   Med rec pending by pharmacy team.    Bernard Drivers, MD 09/14/23 0816  TOC team, Mathew, indicates pt has bed at [facility] and can be d/c to there. Pt/spouse agreeable w that plan.       Bernard Drivers, MD 09/14/23 [phone]

## 2023-09-14 NOTE — Evaluation (Signed)
 Physical Therapy Evaluation Patient Details Name: Ernest Campbell MRN: [id number] DOB: Oct 27, 1950 Today's Date: 09/14/2023  History of Present Illness  73 y.o. male presents to [facility] hospital on 09/13/2023 with intermittent bouts of agitation, spouse feels she is no longer able to care for the patient. PMH includes recent CVA, TBI, other secondary parkinsonism.  Clinical Impression  Pt presents to PT with deficits in functional mobiltiy, gait, balance, cognition, coordination, awareness. Pt demonstrates short shuffling steps and often with a posterior and retropulsive lean, is at a very high risk for falls. Pt appears to have poor awareness of this tendency and is unable to correct without cueing or physical assistance. Of note, pt was recently admitted for management of a R PCA infarct. When asked about his recent stroke the pt's wife reports that the pt did not have a stroke and that he was here for delirium. A cerebellar CVA could play a large role in the patient's current deficits, and a poor understanding of the pt's medical condition appears present at this time. Patient will benefit from continued inpatient follow up therapy, <3 hours/day.      If plan is discharge home, recommend the following: A lot of help with walking and/or transfers;A lot of help with bathing/dressing/bathroom;Assistance with cooking/housework;Assistance with feeding;Direct supervision/assist for medications management;Direct supervision/assist for financial management;Assist for transportation;Help with stairs or ramp for entrance;Supervision due to cognitive status   Can travel by private vehicle   Yes    Equipment Recommendations Wheelchair (measurements PT);Wheelchair cushion (measurements PT)  Recommendations for Other Services       Functional Status Assessment Patient has had a recent decline in their functional status and demonstrates the ability to make significant improvements in function in a reasonable and  predictable amount of time.     Precautions / Restrictions Precautions Precautions: Fall Precaution Comments: retropulsive Restrictions Weight Bearing Restrictions Per Provider Order: No      Mobility  Bed Mobility Overal bed mobility: Needs Assistance Bed Mobility: Rolling, Sidelying to Sit, Sit to Sidelying Rolling: Contact guard assist Sidelying to sit: Contact guard assist     Sit to sidelying: Min assist      Transfers Overall transfer level: Needs assistance Equipment used: Rolling walker (2 wheels) Transfers: Sit to/from Stand Sit to Stand: Min assist           General transfer comment: posterior lean    Ambulation/Gait Ambulation/Gait assistance: Mod assist Gait Distance (Feet): 40 Feet Assistive device: Rolling walker (2 wheels) Gait Pattern/deviations: Shuffle Gait velocity: reduced Gait velocity interpretation: <1.31 ft/sec, indicative of household ambulator   General Gait Details: pt with frequent posterior lean, requiring near constant min-modA to correct. Pt with difficulty turning and often with short shuffling steps. Gait appears very similar to that of an individual with Parkinson's disease.  Stairs            Wheelchair Mobility     Tilt Bed    Modified Rankin (Stroke Patients Only)       Balance Overall balance assessment: Needs assistance Sitting-balance support: Single extremity supported, Feet supported Sitting balance-Leahy Scale: Poor     Standing balance support: Bilateral upper extremity supported, Reliant on assistive device for balance Standing balance-Leahy Scale: Poor Standing balance comment: posterior lean                             Pertinent Vitals/Pain Pain Assessment Pain Assessment: No/denies pain    Home Living Family/patient  expects to be discharged to:: Private residence Living Arrangements: Spouse/significant other Available Help at Discharge: Family;Available 24 hours/day Type of  Home: House Home Access: Stairs to enter Entrance Stairs-Rails: None Entrance Stairs-Number of Steps: 6   Home Layout: Two level;Able to live on main level with bedroom/bathroom Home Equipment: Cane - single point;Rollator (4 wheels);Rolling Walker (2 wheels);Grab bars - tub/shower;Hospital bed      Prior Function Prior Level of Function : Independent/Modified Independent             Mobility Comments: pt was ambulatory without DME prior to december, had a fall at home on 1/6 and has not walked since per family       Extremity/Trunk Assessment   Upper Extremity Assessment Upper Extremity Assessment: Generalized weakness    Lower Extremity Assessment Lower Extremity Assessment: Generalized weakness;RLE deficits/detail;LLE deficits/detail RLE Coordination: decreased gross motor LLE Coordination: decreased gross motor    Cervical / Trunk Assessment Cervical / Trunk Assessment: Kyphotic  Communication   Communication Communication: Difficulty communicating thoughts/reduced clarity of speech Cueing Techniques: Verbal cues;Gestural cues;Visual cues;Tactile cues  Cognition Arousal: Alert Behavior During Therapy: Impulsive Overall Cognitive Status: Impaired/Different from baseline Area of Impairment: Orientation, Memory, Attention, Following commands, Safety/judgement, Awareness, Problem solving                 Orientation Level: Disoriented to, Time, Situation, Place Current Attention Level: Focused Memory: Decreased recall of precautions, Decreased short-term memory Following Commands: Follows one step commands with increased time, Follows multi-step commands inconsistently Safety/Judgement: Decreased awareness of safety, Decreased awareness of deficits Awareness: Intellectual Problem Solving: Slow processing, Difficulty sequencing, Requires verbal cues          General Comments General comments (skin integrity, edema, etc.): VSS on RA    Exercises      Assessment/Plan    PT Assessment Patient needs continued PT services  PT Problem List Decreased strength;Decreased activity tolerance;Decreased balance;Decreased mobility;Decreased coordination;Decreased cognition;Decreased knowledge of use of DME;Decreased safety awareness;Decreased knowledge of precautions       PT Treatment Interventions Gait training;DME instruction;Stair training;Functional mobility training;Therapeutic activities;Therapeutic exercise;Balance training;Neuromuscular re-education;Cognitive remediation;Patient/family education;Wheelchair mobility training    PT Goals (Current goals can be found in the Care Plan section)  Acute Rehab PT Goals Patient Stated Goal: to reduce falls risk, return to ambulation PT Goal Formulation: With patient/family Time For Goal Achievement: 09/28/23 Potential to Achieve Goals: Fair    Frequency Min 1X/week     Co-evaluation               AM-PAC PT 6 Clicks Mobility  Outcome Measure Help needed turning from your back to your side while in a flat bed without using bedrails?: A Little Help needed moving from lying on your back to sitting on the side of a flat bed without using bedrails?: A Little Help needed moving to and from a bed to a chair (including a wheelchair)?: A Lot Help needed standing up from a chair using your arms (e.g., wheelchair or bedside chair)?: A Little Help needed to walk in hospital room?: A Lot Help needed climbing 3-5 steps with a railing? : Total 6 Click Score: 14    End of Session Equipment Utilized During Treatment: Gait belt Activity Tolerance: Patient tolerated treatment well Patient left: in bed;with family/visitor present Nurse Communication: Mobility status PT Visit Diagnosis: Other abnormalities of gait and mobility (R26.89);Muscle weakness (generalized) (M62.81);History of falling (Z91.81);Other symptoms and signs involving the nervous system (M70.101)    Time: 9174-9152 PT Time  Calculation (min) (ACUTE ONLY): 22 min   Charges:   PT Evaluation $PT Eval Low Complexity: 1 Low   PT General Charges $$ ACUTE PT VISIT: 1 Visit         Bernardino JINNY Ruth, PT, DPT Acute Rehabilitation Office [phone]   Bernardino JINNY Ruth 09/14/2023, 9:01 AM

## 2023-09-14 NOTE — ED Notes (Addendum)
 PT at [facility] speaking with pt and souse. Pt mildly restless and fidgety. NAD, relatively calm at this time. Pending SNF placement and SW/ TOC assessment/ intervention.

## 2023-09-14 NOTE — Discharge Instructions (Signed)
 It was our pleasure to provide your ER care today - we hope that you feel better.  Transport patient to [facility].   Fall precautions.   Follow up closely with your primary care doctor and your neurologist in the next couple weeks.   Return to ER if worse, new symptoms, high fevers, increased trouble breathing, or other emergency concern.

## 2023-09-14 NOTE — NC FL2 (Signed)
  [place]  MEDICAID FL2 LEVEL OF CARE FORM     IDENTIFICATION  Patient Name: Ernest Campbell Birthdate: [birth year] Sex: male Admission Date (Current Location): 09/13/2023  [facility] and [place] Number:  Producer, Television/film/video and Address:  [facility], [address] [place], [place] [address]      Provider Number: [id number]  Attending Physician Name and Address:  System, Provider Not In  Relative Name and Phone Number:       Current Level of Care: Hospital Recommended Level of Care: Skilled Nursing Facility Prior Approval Number:    Date Approved/Denied:   PASRR Number: [id number]  Discharge Plan: SNF    Current Diagnoses: Patient Active Problem List   Diagnosis Date Noted   Altered mental status 07/21/2023   CVA (cerebral vascular accident) (HCC) 07/18/2023   Shuffling gait 07/03/2021   Episodes of formed visual hallucinations 07/03/2021   Nocturnal enuresis 07/03/2021   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   TBI (traumatic brain injury) (HCC) 07/03/2021   History of short term memory loss 07/03/2021    Orientation RESPIRATION BLADDER Height & Weight        Normal Continent Weight:   Height:     BEHAVIORAL SYMPTOMS/MOOD NEUROLOGICAL BOWEL NUTRITION STATUS      Continent Diet  AMBULATORY STATUS COMMUNICATION OF NEEDS Skin   Extensive Assist Verbally Normal                       Personal Care Assistance Level of Assistance  Bathing, Feeding, Dressing Bathing Assistance: Maximum assistance Feeding assistance: Limited assistance Dressing Assistance: Maximum assistance     Functional Limitations Info  Sight, Hearing, Speech Sight Info: Adequate Hearing Info: Adequate Speech Info: Adequate    SPECIAL CARE FACTORS FREQUENCY  PT (By licensed PT), OT (By licensed OT)     PT Frequency: 5x weekly OT Frequency: 5x weekly            Contractures Contractures Info: Not  present    Additional Factors Info  Code Status, Allergies Code Status Info: Full Code Allergies Info: Other  Prednisone   Acetaminophen   Codeine  Morphine   Oxycodone           Current Medications (09/14/2023):  This is the current hospital active medication list Current Facility-Administered Medications  Medication Dose Route Frequency Provider Last Rate Last Admin   carbidopa -levodopa  (SINEMET  IR) 25-100 MG per tablet immediate release 1 tablet  1 tablet Oral TID WC Cardama, Raynell Moder, MD       memantine  (NAMENDA ) tablet 5 mg  5 mg Oral BID Cardama, Raynell Moder, MD       Current Outpatient Medications  Medication Sig Dispense Refill   aspirin  EC 81 MG tablet Take 1 tablet (81 mg total) by mouth daily. Swallow whole. 30 tablet 12   carbidopa -levodopa  (SINEMET  IR) 25-100 MG tablet Take 1 tablet by mouth 3 (three) times daily with meals. 270 tablet 3   ibuprofen  (ADVIL ) 200 MG tablet Take 600 mg by mouth 2 (two) times daily as needed (for back pain).     memantine  (NAMENDA ) 5 MG tablet Take 1 tablet (5 mg total) by mouth 2 (two) times daily. 60 tablet 5     Discharge Medications: Please see discharge summary for a list of discharge medications.  Relevant Imaging Results:  Relevant Lab Results:   Additional Information SSN: [id number]  Niels LITTIE Portugal, LCSW

## 2023-09-14 NOTE — ED Provider Notes (Signed)
  EMERGENCY DEPARTMENT AT [facility] Provider Note  CSN: [id number] Arrival date & time: 09/13/23 1402  Chief Complaint(s) Nursing Home Placement  HPI Ernest Campbell is a 73 y.o. male with CVA and recent medication related delirium brought in by wife for placement for PT rehab. Wife has reportedly been in contact with [facility] and Mrs. Draper at the facility recommended she come to the ER for assistance with placement for insurance coverage. Wife reports that patient has intermittent bouts of agitation and has been harder to manage at home and perform PT.     The history is provided by the spouse.    Past Medical History No past medical history on file. 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 Authorizing Provider  aspirin  EC 81 MG tablet Take 1 tablet (81 mg total) by mouth daily. Swallow whole. 07/22/23   Christobal Guadalajara, MD  carbidopa -levodopa  (SINEMET  IR) 25-100 MG tablet Take 1 tablet by mouth 3 (three) times daily with meals. 08/14/23   Dohmeier, Dedra, MD  ibuprofen  (ADVIL ) 200 MG tablet Take 600 mg by mouth 2 (two) times daily as needed (for back pain).    [provider]  memantine  (NAMENDA ) 5 MG tablet Take 1 tablet (5 mg total) by mouth 2 (two) times daily. 09/03/23   Dohmeier, Dedra, MD                                                                                                                                    Allergies Other, Prednisone ,  Acetaminophen , Codeine, Morphine , and Oxycodone  Review of Systems Review of Systems As noted in HPI  Physical Exam Vital Signs  I have reviewed the triage vital signs BP (!) 105/55 (BP Location: Left Arm)   Pulse 70   Temp 98 F (36.7 C) (Oral)   Resp 15   SpO2 100%   Physical Exam Vitals reviewed.  Constitutional:      General: He is not in acute distress.    Appearance: He is well-developed. He is not diaphoretic.  HENT:     Head: Normocephalic and atraumatic.     Right Ear: External ear normal.     Left Ear: External ear normal.     Nose: Nose normal.     Mouth/Throat:     Mouth: Mucous membranes are moist.  Eyes:     General: No scleral icterus.    Conjunctiva/sclera: Conjunctivae normal.  Neck:  Trachea: Phonation normal.  Cardiovascular:     Rate and Rhythm: Normal rate and regular rhythm.  Pulmonary:     Effort: Pulmonary effort is normal. No respiratory distress.     Breath sounds: No stridor.  Abdominal:     General: There is no distension.  Musculoskeletal:        General: Normal range of motion.     Cervical back: Normal range of motion.  Neurological:     Mental Status: He is alert. He is disoriented.     Comments: Moving all extremities  Psychiatric:        Behavior: Behavior normal.     ED Results and Treatments Labs (all labs ordered are listed, but only abnormal results are displayed) Labs Reviewed  URINALYSIS, ROUTINE W REFLEX MICROSCOPIC - Abnormal; Notable for the following components:      Result Value   APPearance HAZY (*)    All other components within normal limits  BASIC METABOLIC PANEL - Abnormal; Notable for the following components:   Glucose, Bld 104 (*)    All other components within normal limits  CBC WITH DIFFERENTIAL/PLATELET                                                                                                                         EKG  EKG Interpretation Date/Time:  Friday September 13 2023 14:33:40  EST Ventricular Rate:  87 PR Interval:  156 QRS Duration:  76 QT Interval:  354 QTC Calculation: 425 R Axis:   66  Text Interpretation: Normal sinus rhythm Normal ECG When compared with ECG of 18-Jul-2023 15:47, PREVIOUS ECG IS PRESENT No acute changes Confirmed by Trine Likes [phone]) on 09/13/2023 11:33:08 PM       Radiology No results found.  Medications Ordered in ED Medications  carbidopa -levodopa  (SINEMET  IR) 25-100 MG per tablet immediate release 1 tablet (has no administration in time range)  memantine  (NAMENDA ) tablet 5 mg (has no administration in time range)   Procedures Procedures  (including critical care time) Medical Decision Making / ED Course   Medical Decision Making Risk Prescription drug management.    Labs without any acute changes.  UA without evidence of infection. TOC consult placed Attempted call Shona Chang unsuccessful at [phone] Informed wife that patient may not get placed from ER, but will have SW speak with them.    Final Clinical Impression(s) / ED Diagnoses Final diagnoses:  None    This chart was dictated using voice recognition software.  Despite best efforts to proofread,  errors can occur which can change the documentation meaning.    Trine Likes Moder, MD 09/14/23 [phone]

## 2023-09-16 ENCOUNTER — Telehealth: Payer: Self-pay | Admitting: Neurology

## 2023-09-16 NOTE — Telephone Encounter (Signed)
 Pt's niece, Cleve Dale calling to reschedule patient's EEG. Patient is in a rehabilitation facility and they will transport to the appt.

## 2023-09-25 ENCOUNTER — Emergency Department (HOSPITAL_COMMUNITY): Payer: Medicare Other

## 2023-09-25 ENCOUNTER — Encounter (HOSPITAL_COMMUNITY): Payer: Self-pay

## 2023-09-25 ENCOUNTER — Inpatient Hospital Stay (HOSPITAL_COMMUNITY)
Admission: EM | Admit: 2023-09-25 | Discharge: 2023-09-30 | DRG: 480 | Disposition: A | Discharge status: Skilled Nursing Facility | Payer: Medicare Other | Source: Skilled Nursing Facility | Attending: Internal Medicine | Admitting: Internal Medicine

## 2023-09-25 ENCOUNTER — Other Ambulatory Visit: Payer: Self-pay

## 2023-09-25 DIAGNOSIS — G218 Other secondary parkinsonism: Secondary | ICD-10-CM | POA: Diagnosis present

## 2023-09-25 DIAGNOSIS — E8809 Other disorders of plasma-protein metabolism, not elsewhere classified: Secondary | ICD-10-CM | POA: Diagnosis present

## 2023-09-25 DIAGNOSIS — Z803 Family history of malignant neoplasm of breast: Secondary | ICD-10-CM

## 2023-09-25 DIAGNOSIS — D62 Acute posthemorrhagic anemia: Secondary | ICD-10-CM | POA: Diagnosis not present

## 2023-09-25 DIAGNOSIS — M549 Dorsalgia, unspecified: Secondary | ICD-10-CM | POA: Diagnosis present

## 2023-09-25 DIAGNOSIS — Z886 Allergy status to analgesic agent status: Secondary | ICD-10-CM

## 2023-09-25 DIAGNOSIS — Z8673 Personal history of transient ischemic attack (TIA), and cerebral infarction without residual deficits: Secondary | ICD-10-CM

## 2023-09-25 DIAGNOSIS — F0390 Unspecified dementia without behavioral disturbance: Secondary | ICD-10-CM | POA: Diagnosis present

## 2023-09-25 DIAGNOSIS — I639 Cerebral infarction, unspecified: Secondary | ICD-10-CM | POA: Diagnosis present

## 2023-09-25 DIAGNOSIS — G8220 Paraplegia, unspecified: Secondary | ICD-10-CM | POA: Insufficient documentation

## 2023-09-25 DIAGNOSIS — S72001A Fracture of unspecified part of neck of right femur, initial encounter for closed fracture: Secondary | ICD-10-CM | POA: Diagnosis not present

## 2023-09-25 DIAGNOSIS — E43 Unspecified severe protein-calorie malnutrition: Secondary | ICD-10-CM | POA: Insufficient documentation

## 2023-09-25 DIAGNOSIS — F17210 Nicotine dependence, cigarettes, uncomplicated: Secondary | ICD-10-CM | POA: Diagnosis present

## 2023-09-25 DIAGNOSIS — Z7982 Long term (current) use of aspirin: Secondary | ICD-10-CM

## 2023-09-25 DIAGNOSIS — W06XXXA Fall from bed, initial encounter: Secondary | ICD-10-CM | POA: Diagnosis present

## 2023-09-25 DIAGNOSIS — D539 Nutritional anemia, unspecified: Secondary | ICD-10-CM | POA: Diagnosis present

## 2023-09-25 DIAGNOSIS — Z823 Family history of stroke: Secondary | ICD-10-CM | POA: Diagnosis not present

## 2023-09-25 DIAGNOSIS — S72141A Displaced intertrochanteric fracture of right femur, initial encounter for closed fracture: Principal | ICD-10-CM | POA: Diagnosis present

## 2023-09-25 DIAGNOSIS — Y92122 Bedroom in nursing home as the place of occurrence of the external cause: Secondary | ICD-10-CM

## 2023-09-25 DIAGNOSIS — S069XAA Unspecified intracranial injury with loss of consciousness status unknown, initial encounter: Secondary | ICD-10-CM | POA: Diagnosis present

## 2023-09-25 DIAGNOSIS — Z885 Allergy status to narcotic agent status: Secondary | ICD-10-CM | POA: Diagnosis not present

## 2023-09-25 DIAGNOSIS — M25551 Pain in right hip: Secondary | ICD-10-CM | POA: Diagnosis present

## 2023-09-25 DIAGNOSIS — Z87820 Personal history of traumatic brain injury: Secondary | ICD-10-CM

## 2023-09-25 DIAGNOSIS — Z79899 Other long term (current) drug therapy: Secondary | ICD-10-CM

## 2023-09-25 DIAGNOSIS — M47812 Spondylosis without myelopathy or radiculopathy, cervical region: Secondary | ICD-10-CM | POA: Insufficient documentation

## 2023-09-25 HISTORY — DX: Unspecified dementia, unspecified severity, without behavioral disturbance, psychotic disturbance, mood disturbance, and anxiety: F03.90

## 2023-09-25 LAB — BASIC METABOLIC PANEL
Anion gap: 6 (ref 5–15)
BUN: 16 mg/dL (ref 8–23)
CO2: 26 mmol/L (ref 22–32)
Calcium: 8.5 mg/dL — ABNORMAL LOW (ref 8.9–10.3)
Chloride: 105 mmol/L (ref 98–111)
Creatinine, Ser: 0.79 mg/dL (ref 0.61–1.24)
GFR, Estimated: >60 mL/min (ref >60)
Glucose, Bld: 105 mg/dL — ABNORMAL HIGH (ref 70–99)
Potassium: 3.9 mmol/L (ref 3.5–5.1)
Sodium: 137 mmol/L (ref 135–145)

## 2023-09-25 LAB — CBC WITH DIFFERENTIAL/PLATELET
Abs Immature Granulocytes: 0.57 10*3/uL — ABNORMAL HIGH (ref 0.00–0.07)
Basophils Absolute: 0.1 10*3/uL (ref 0.0–0.1)
Basophils Relative: 1 %
Eosinophils Absolute: 0.1 10*3/uL (ref 0.0–0.5)
Eosinophils Relative: 1 %
HCT: 34.4 % — ABNORMAL LOW (ref 39.0–52.0)
Hemoglobin: 11.3 g/dL — ABNORMAL LOW (ref 13.0–17.0)
Immature Granulocytes: 5 %
Lymphocytes Relative: 23 %
Lymphs Abs: 2.4 10*3/uL (ref 0.7–4.0)
MCH: 33.3 pg (ref 26.0–34.0)
MCHC: 32.8 g/dL (ref 30.0–36.0)
MCV: 101.5 fL — ABNORMAL HIGH (ref 80.0–100.0)
Monocytes Absolute: 1.3 10*3/uL — ABNORMAL HIGH (ref 0.1–1.0)
Monocytes Relative: 12 %
Neutro Abs: 6.1 10*3/uL (ref 1.7–7.7)
Neutrophils Relative %: 58 %
Platelets: 313 10*3/uL (ref 150–400)
RBC: 3.39 MIL/uL — ABNORMAL LOW (ref 4.22–5.81)
RDW: 13.6 % (ref 11.5–15.5)
WBC: 10.5 10*3/uL (ref 4.0–10.5)
nRBC: 0 % (ref 0.0–0.2)

## 2023-09-25 MED ORDER — SODIUM CHLORIDE 0.9 % IV SOLN: INTRAVENOUS | Status: DC

## 2023-09-25 MED ORDER — MEMANTINE HCL 10 MG PO TABS
5.0000 mg | ORAL_TABLET | Freq: Two times a day (BID) | ORAL | Status: DC
  Administered 2023-09-25 – 2023-09-30 (×9): 5 mg via ORAL
  Filled 2023-09-25 (×10): qty 1

## 2023-09-25 MED ORDER — IBUPROFEN 100 MG/5ML PO SUSP
400.0000 mg | Freq: Four times a day (QID) | ORAL | Status: DC
  Filled 2023-09-25: qty 20

## 2023-09-25 MED ORDER — CARBIDOPA-LEVODOPA 25-100 MG PO TABS
1.0000 | ORAL_TABLET | Freq: Three times a day (TID) | ORAL | Status: DC
  Administered 2023-09-26 – 2023-09-30 (×12): 1 via ORAL
  Filled 2023-09-25 (×14): qty 1

## 2023-09-25 MED ORDER — IBUPROFEN 100 MG/5ML PO SUSP
400.0000 mg | Freq: Four times a day (QID) | ORAL | Status: DC
  Administered 2023-09-25: 400 mg via ORAL

## 2023-09-25 MED ORDER — KETOROLAC TROMETHAMINE 15 MG/ML IJ SOLN
15.0000 mg | Freq: Four times a day (QID) | INTRAMUSCULAR | Status: AC | PRN
  Administered 2023-09-25: 15 mg via INTRAVENOUS
  Filled 2023-09-25: qty 1

## 2023-09-25 MED ORDER — ACETAMINOPHEN 325 MG PO TABS
650.0000 mg | ORAL_TABLET | Freq: Four times a day (QID) | ORAL | Status: DC | PRN
  Administered 2023-09-25 – 2023-09-30 (×9): 650 mg via ORAL
  Filled 2023-09-25 (×9): qty 2

## 2023-09-25 NOTE — Progress Notes (Signed)
Spoke with pt's wife, she expressed concern about use of Fentanyl in upcoming surgery. Wants to ensure it's not used.

## 2023-09-25 NOTE — Consult Note (Signed)
ORTHOPAEDIC CONSULTATION  REQUESTING PHYSICIAN: Bobette Mo, MD  Chief Complaint: Right hip pain  HPI: Ernest Campbell is a 73 y.o. male who complains of right hip pain after mechanical fall.  Most of the history that I got was from the wife was at the bedside.  He has had significant right sided radicular type pain, and has been getting injections periodically, he apparently had an episode where he received some opiates a couple of months ago, and he has never been able to mentally recover according to the wife.  She desperately does not want him to get any more opiates if at all possible.  The patient is basically nonverbal, and does not interact with me in any considerable way.  He is awake.  Past Medical History:  Diagnosis Date   Dementia (HCC)    History reviewed. No pertinent surgical history. Social History   Socioeconomic History   Marital status: Married    Spouse name: Not on file   Number of children: Not on file   Years of education: Not on file   Highest education level: Not on file  Occupational History   Not on file  Tobacco Use   Smoking status: Every Day    Current packs/day: 0.50    Types: Cigarettes   Smokeless tobacco: Not on file  Substance and Sexual Activity   Alcohol use: Never   Drug use: Never   Sexual activity: Not on file  Other Topics Concern   Not on file  Social History Narrative   Lives with wife    Pt retired    Chief Executive Officer Drivers of Corporate investment banker Strain: Low Risk  (10/20/2022)   Received from [employer], [facility]   Overall Financial Resource Strain (CARDIA)    Difficulty of Paying Living Expenses: Not hard at all  Food Insecurity: No Food Insecurity (09/25/2023)   Hunger Vital Sign    Worried About Running Out of Food in the Last Year: Never true    Ran Out of Food in the Last Year: Never true  Transportation Needs: No Transportation Needs (09/25/2023)   PRAPARE - Administrator, Civil Service  (Medical): No    Lack of Transportation (Non-Medical): No  Physical Activity: Unknown (10/20/2022)   [facility], [facility]   Exercise Vital Sign    Days of Exercise per Week: Patient declined    Minutes of Exercise per Session: 30 min  Stress: Patient Declined (10/20/2022)   Received from [facility], [facility] - Occupational Stress Questionnaire    Feeling of Stress : Patient declined  Social Connections: Moderately Isolated (09/25/2023)   Social Connection and Isolation Panel [NHANES]    Frequency of Communication with Friends and Family: More than three times a week    Frequency of Social Gatherings with Friends and Family: Three times a week    Attends Religious Services: Never    Active Member of Clubs or Organizations: No    Attends Engineer, structural: Never    Marital Status: Married   Family History  Problem Relation Age of Onset   Dementia Mother    Parkinsonism Father    Stroke Father    Breast cancer Sister    Allergies  Allergen Reactions   Fentanyl And Related Anaphylaxis   Other Other (See Comments)    According to the patient, all opioids cause hallucinations   Prednisone Other (See Comments)  Hallucinations    Acetaminophen     Hallucinations   Codeine Other (See Comments)    Hallucinations   Morphine Other (See Comments)    Hallucinations   Oxycodone Other (See Comments)    Hallucinations     Positive ROS: All other systems have been reviewed and were otherwise negative with the exception of those mentioned in the HPI and as above.  Physical Exam:BP 129/85   Pulse 83   Resp 16   Ht 5\' 6"  (1.676 m)   Wt 67.1 kg   SpO2 98%   BMI 23.89 kg/m   General: Alert, laying in bed, in mittens.   Cardiovascular: No pedal edema Respiratory: No cyanosis, no use of accessory musculature GI: No organomegaly, abdomen is soft and non-tender Skin: No lesions in the area of chief  complaint Neurologic: Sensation intact distally Psychiatric: Patient is not competent for consent but his wife is at the bedside  MUSCULOSKELETAL: Right leg has positive logroll, EHL and FHL are intact, he will follow commands minimally  Assessment: Principal Problem:   Closed right hip fracture, initial encounter [facility]) Active Problems:   Other secondary parkinsonism (HCC)   TBI (traumatic brain injury) (HCC)   CVA (cerebral vascular accident) (HCC)   Macrocytic anemia   Hypocalcemia   Plan: Plan for right hip internal fixation with Dr. Jena Gauss tomorrow.  I discussed the risks benefits and alternatives with the family.  They desperately want to avoid any narcotics.  I will write him for Tylenol, and he also apparently does well with liquid ibuprofen.  Plan for surgery tomorrow, n.p.o. after midnight per protocol.    Eulas Post, MD Cell [phone]   09/25/2023 6:21 PM

## 2023-09-25 NOTE — Progress Notes (Signed)
Patient with right hip fracture.  Plan for surgery once optimized.    Full consult to follow.   Eulas Post, MD

## 2023-09-25 NOTE — H&P (Signed)
History and Physical    Patient: Ernest Campbell MWN:[id number] DOB: 02-Jul-1951 DOA: 09/25/2023 DOS: the patient was seen and examined on 09/25/2023 PCP: Margot Ables, MD (Inactive)  Patient coming from: SNF  Chief Complaint:  Chief Complaint  Patient presents with   Fall   HPI: Ernest Campbell is a 73 y.o. male with medical history significant of CVA, dementia, parkinsonism, traumatic brain injury who was brought from his facility due to having a fall in the morning and no LOC, but the patient was unable to get up on his own due to right hip pain.  He is unable to provide any more history at the moment.  Lab work: CBC showed a white count of 10.5, hemoglobin 11.3 g/dL with an MCV of 403.4 fL and platelets 313.  BMP showed a glucose of 105 and calcium of 8.5 mg/dL, but is otherwise unremarkable.  Imaging: Right hip x-ray with possible nondisplaced intertrochanteric fracture that was confirmed on CT scan.  CT scan also prominent showed distention of the bladder.  2 view chest radiograph show aortic atherosclerosis, but no acute findings.  Heart size and mediastinal contours were normal.   ED course: Initial vital signs were pulse 80, respirations 16, BP 115/71 mmHg O2 sat 100% on room air.  Review of Systems: As mentioned in the history of present illness. All other systems reviewed and are negative. Past Medical History:  Diagnosis Date   Dementia (HCC)    History reviewed. No pertinent surgical history. Social History:  reports that he has been smoking cigarettes. He does not have any smokeless tobacco history on file. He reports that he does not drink alcohol and does not use drugs.  Allergies  Allergen Reactions   Other Other (See Comments)    According to the patient, all opioids cause hallucinations   Prednisone Other (See Comments)    Hallucinations    Acetaminophen     Hallucinations   Codeine Other (See Comments)    Hallucinations   Morphine Other (See  Comments)    Hallucinations   Oxycodone Other (See Comments)    Hallucinations    Family History  Problem Relation Age of Onset   Dementia Mother    Parkinsonism Father    Stroke Father    Breast cancer Sister     Prior to Admission medications   Medication Sig Start Date End Date Taking? Authorizing Provider  aspirin EC 81 MG tablet Take 1 tablet (81 mg total) by mouth daily. Swallow whole. 07/22/23   Lanae Boast, MD  carbidopa-levodopa (SINEMET IR) 25-100 MG tablet Take 1 tablet by mouth 3 (three) times daily with meals. 08/14/23   Dohmeier, Porfirio Mylar, MD  ibuprofen (ADVIL) 200 MG tablet Take 600 mg by mouth 2 (two) times daily as needed (for back pain).    [provider]  memantine (NAMENDA) 5 MG tablet Take 1 tablet (5 mg total) by mouth 2 (two) times daily. 09/03/23   Dohmeier, Porfirio Mylar, MD    Physical Exam: Vitals:   09/25/23 1000 09/25/23 1030 09/25/23 1130 09/25/23 1148  BP: 116/88 99/67 (!) 81/71 (!) 121/56  Pulse: 82 81 85 81  Resp: 18 16  18   SpO2: 100% 99% 100% 100%  Weight:      Height:       Physical Exam Vitals and nursing note reviewed.  Constitutional:      General: He is awake. He is not in acute distress.    Appearance: Normal appearance. He is ill-appearing.  HENT:  Head: Normocephalic.     Nose: No rhinorrhea.     Mouth/Throat:     Mouth: Mucous membranes are dry.  Eyes:     General: No scleral icterus.    Pupils: Pupils are equal, round, and reactive to light.  Cardiovascular:     Rate and Rhythm: Normal rate and regular rhythm.  Pulmonary:     Effort: Pulmonary effort is normal.     Breath sounds: Normal breath sounds. No wheezing, rhonchi or rales.  Abdominal:     General: Bowel sounds are normal. There is no distension.     Palpations: Abdomen is soft.     Tenderness: There is no abdominal tenderness.  Musculoskeletal:     Cervical back: Neck supple.     Right hip: Tenderness present. Decreased range of motion.     Right lower leg:  No edema.     Left lower leg: No edema.  Skin:    General: Skin is warm and dry.  Neurological:     General: No focal deficit present.     Mental Status: He is alert and oriented to person, place, and time.  Psychiatric:        Mood and Affect: Mood normal.        Behavior: Behavior normal. Behavior is cooperative.     Data Reviewed:  Results are pending, will review when available.  EKG: Vent. rate 80 BPM PR interval 157 ms QRS duration 92 ms QT/QTcB 364/420 ms P-R-T axes 68 70 48 Sinus rhythm  Assessment and Plan: Principal Problem:   Closed right hip fracture, initial encounter (HCC) Admit to t MedSurg/inpatient. Ice area as needed. Buck's traction per protocol. Analgesics as needed. Antiemetics as needed. Consult TOC team. Consult nutritional services. PT evaluation after surgery. Orthopedic surgery evaluation appreciated.  Active Problems:   Other secondary parkinsonism (HCC) Supportive care. Continue Sinemet IR 25-100 mg p.o. 3 times daily. Follow-up with neurology as an outpatient.    TBI (traumatic brain injury) [facility])   CVA (cerebral vascular accident) (HCC) Supportive care. Reorient as needed. Continue memantine 5 mg p.o. twice daily.    Macrocytic anemia Monitor hematocrit hemoglobin.    Hypocalcemia Recheck calcium with albumin level in AM. Further workup depending on results.      Advance Care Planning:   Code Status: Full Code   Consults: Orthopedic surgery Teryl Lucy, MD).  Family Communication:   Severity of Illness: The appropriate patient status for this patient is INPATIENT. Inpatient status is judged to be reasonable and necessary in order to provide the required intensity of service to ensure the patient's safety. The patient's presenting symptoms, physical exam findings, and initial radiographic and laboratory data in the context of their chronic comorbidities is felt to place them at high risk for further clinical  deterioration. Furthermore, it is not anticipated that the patient will be medically stable for discharge from the hospital within 2 midnights of admission.   * I certify that at the point of admission it is my clinical judgment that the patient will require inpatient hospital care spanning beyond 2 midnights from the point of admission due to high intensity of service, high risk for further deterioration and high frequency of surveillance required.*  Author: Bobette Mo, MD 09/25/2023 12:19 PM  For on call review [email or web address].   This document was prepared using Dragon voice recognition software and may contain some unintended transcription errors.

## 2023-09-25 NOTE — ED Triage Notes (Signed)
Patient at [facility], fell out of bed this AM, no LOC, used hoyer lift to get patient back in bed, normally walks with walker but couldn't get back up due to right leg pain unable to extend due to pain. Patient does not take blood thinner did not hitting head. A&Ox4.

## 2023-09-25 NOTE — Anesthesia Preprocedure Evaluation (Signed)
Anesthesia Evaluation  Patient identified by MRN, date of birth, ID band Patient awake    Reviewed: Allergy & Precautions, NPO status , Patient's Chart, lab work & pertinent test results  Airway Mallampati: II  TM Distance: >3 FB Neck ROM: Full    Dental  (+) Dental Advisory Given, Teeth Intact   Pulmonary Current Smoker and Patient abstained from smoking.   Pulmonary exam normal breath sounds clear to auscultation       Cardiovascular Normal cardiovascular exam Rhythm:Regular Rate:Normal  Echo 07/2023  1. Incomplete echocardiogram due to patient preference. Only one diagnostic image obtained.   2. Left ventricular ejection fraction, by estimation, is 55 to 60%. The left ventricle has normal function.   3. Right ventricular systolic function is normal. The right ventricular size is normal.   FINDINGS   Left Ventricle: Left ventricular ejection fraction, by estimation, is 55 to 60%. The left ventricle has normal function. There is no left ventricular hypertrophy.   Right Ventricle: The right ventricular size is normal. Right ventricular systolic function is normal.   Aorta: The aortic root is normal in size and structure.      Neuro/Psych  PSYCHIATRIC DISORDERS     Dementia CVA    GI/Hepatic   Endo/Other    Renal/GU      Musculoskeletal  (+) Arthritis ,    Abdominal   Peds  Hematology  (+) Blood dyscrasia, anemia   Anesthesia Other Findings   Reproductive/Obstetrics                             Anesthesia Physical Anesthesia Plan  ASA: 3  Anesthesia Plan: General   Post-op Pain Management: Gabapentin PO (pre-op)*   Induction:   PONV Risk Score and Plan: 2 and Ondansetron, Dexamethasone and Treatment may vary due to age or medical condition  Airway Management Planned: Oral ETT  Additional Equipment:   Intra-op Plan:   Post-operative Plan: Extubation in OR  Informed  Consent: I have reviewed the patients History and Physical, chart, labs and discussed the procedure including the risks, benefits and alternatives for the proposed anesthesia with the patient or authorized representative who has indicated his/her understanding and acceptance.     Dental advisory given  Plan Discussed with: CRNA  Anesthesia Plan Comments:        Anesthesia Quick Evaluation

## 2023-09-25 NOTE — ED Provider Notes (Signed)
Ernest Campbell Provider Note   CSN: [id number] Arrival date & time: 09/25/23  7829     History  Chief Complaint  Patient presents with   Ernest Campbell is a 73 y.o. male.  73 year old male who presents with right-sided hip pain.  Patient fell out of his bed onto the right hip.  No LOC.  No head or neck discomfort.  Complains of sharp pain to his right hip.  Patient normally is full assistance with ambulating.  Was unable to stand on the hip.  Notes severe pain when trying to extend his leg.  Presents via EMS       Home Medications Prior to Admission medications   Medication Sig Start Date End Date Taking? Authorizing Provider  aspirin EC 81 MG tablet Take 1 tablet (81 mg total) by mouth daily. Swallow whole. 07/22/23   Lanae Boast, MD  carbidopa-levodopa (SINEMET IR) 25-100 MG tablet Take 1 tablet by mouth 3 (three) times daily with meals. 08/14/23   Dohmeier, Porfirio Mylar, MD  ibuprofen (ADVIL) 200 MG tablet Take 600 mg by mouth 2 (two) times daily as needed (for back pain).    [provider]  memantine (NAMENDA) 5 MG tablet Take 1 tablet (5 mg total) by mouth 2 (two) times daily. 09/03/23   Dohmeier, Porfirio Mylar, MD      Allergies    Other, Prednisone, Acetaminophen, Codeine, Morphine, and Oxycodone    Review of Systems   Review of Systems  All other systems reviewed and are negative.   Physical Exam Updated Vital Signs Ht 1.676 m (5\' 6" )   Wt 67.1 kg   BMI 23.89 kg/m  Physical Exam Vitals and nursing note reviewed.  Constitutional:      General: He is not in acute distress.    Appearance: Normal appearance. He is well-developed. He is not toxic-appearing.  HENT:     Head: Normocephalic and atraumatic.  Eyes:     General: Lids are normal.     Conjunctiva/sclera: Conjunctivae normal.     Pupils: Pupils are equal, round, and reactive to light.  Neck:     Thyroid: No thyroid mass.     Trachea: No tracheal  deviation.  Cardiovascular:     Rate and Rhythm: Normal rate and regular rhythm.     Heart sounds: Normal heart sounds. No murmur heard.    No gallop.  Pulmonary:     Effort: Pulmonary effort is normal. No respiratory distress.     Breath sounds: Normal breath sounds. No stridor. No decreased breath sounds, wheezing, rhonchi or rales.  Abdominal:     General: There is no distension.     Palpations: Abdomen is soft.     Tenderness: There is no abdominal tenderness. There is no rebound.  Musculoskeletal:        General: No tenderness. Normal range of motion.     Cervical back: Normal range of motion and neck supple. No spinous process tenderness or muscular tenderness.       Legs:     Comments: Pain with range of motion to the right lower extremity.  Neurovasc intact at right foot  Skin:    General: Skin is warm and dry.     Findings: No abrasion or rash.  Neurological:     Mental Status: He is alert and oriented to person, place, and time. Mental status is at baseline.     GCS: GCS eye subscore is 4.  GCS verbal subscore is 5. GCS motor subscore is 6.     Cranial Nerves: No cranial nerve deficit.     Sensory: No sensory deficit.     Motor: Motor function is intact.  Psychiatric:        Attention and Perception: Attention normal.        Speech: Speech normal.        Behavior: Behavior normal.     ED Results / Procedures / Treatments   Labs (all labs ordered are listed, but only abnormal results are displayed) Labs Reviewed - No data to display  EKG EKG Interpretation Date/Time:  Wednesday September 25 2023 08:39:20 EST Ventricular Rate:  80 PR Interval:  157 QRS Duration:  92 QT Interval:  364 QTC Calculation: 420 R Axis:   70  Text Interpretation: Sinus rhythm Confirmed by Lorre Nick ([id number]) on 09/25/2023 10:07:59 AM  Radiology No results found.  Procedures Procedures    Medications Ordered in ED Medications - No data to display  ED Course/ Medical  Decision Making/ A&P                                 Medical Decision Making Amount and/or Complexity of Data Reviewed Labs: ordered. Radiology: ordered. ECG/medicine tests: ordered.  Risk Prescription drug management.   Patient's EKG shows sinus rhythm.  X-ray of his hip shows positive fracture.  Labs reviewed and are without significant abnormality.  Family at bedside and discussed findings with him.  Will consult orthopedics and admit to the medicine service        Final Clinical Impression(s) / ED Diagnoses Final diagnoses:  None    Rx / DC Orders ED Discharge Orders     None         Lorre Nick, MD 09/25/23 1141

## 2023-09-26 ENCOUNTER — Inpatient Hospital Stay (HOSPITAL_COMMUNITY): Payer: Medicare Other

## 2023-09-26 ENCOUNTER — Other Ambulatory Visit: Payer: Self-pay

## 2023-09-26 ENCOUNTER — Encounter (HOSPITAL_COMMUNITY)
Admission: EM | Disposition: A | Discharge status: Skilled Nursing Facility | Payer: Self-pay | Source: Skilled Nursing Facility | Attending: Internal Medicine

## 2023-09-26 ENCOUNTER — Inpatient Hospital Stay (HOSPITAL_COMMUNITY): Payer: Medicare Other | Admitting: Anesthesiology

## 2023-09-26 ENCOUNTER — Inpatient Hospital Stay (HOSPITAL_COMMUNITY): Payer: Self-pay | Admitting: Anesthesiology

## 2023-09-26 DIAGNOSIS — I639 Cerebral infarction, unspecified: Secondary | ICD-10-CM | POA: Diagnosis not present

## 2023-09-26 DIAGNOSIS — S72141A Displaced intertrochanteric fracture of right femur, initial encounter for closed fracture: Secondary | ICD-10-CM

## 2023-09-26 DIAGNOSIS — S72001A Fracture of unspecified part of neck of right femur, initial encounter for closed fracture: Secondary | ICD-10-CM | POA: Diagnosis not present

## 2023-09-26 HISTORY — PX: INTRAMEDULLARY (IM) NAIL INTERTROCHANTERIC: SHX5875

## 2023-09-26 LAB — CBC
HCT: 30.4 % — ABNORMAL LOW (ref 39.0–52.0)
Hemoglobin: 10.4 g/dL — ABNORMAL LOW (ref 13.0–17.0)
MCH: 32.9 pg (ref 26.0–34.0)
MCHC: 34.2 g/dL (ref 30.0–36.0)
MCV: 96.2 fL (ref 80.0–100.0)
Platelets: 278 10*3/uL (ref 150–400)
RBC: 3.16 MIL/uL — ABNORMAL LOW (ref 4.22–5.81)
RDW: 13.5 % (ref 11.5–15.5)
WBC: 9.1 10*3/uL (ref 4.0–10.5)
nRBC: 0 % (ref 0.0–0.2)

## 2023-09-26 LAB — COMPREHENSIVE METABOLIC PANEL
ALT: 44 U/L (ref 0–44)
AST: 18 U/L (ref 15–41)
Albumin: 2.4 g/dL — ABNORMAL LOW (ref 3.5–5.0)
Alkaline Phosphatase: 66 U/L (ref 38–126)
Anion gap: 9 (ref 5–15)
BUN: 14 mg/dL (ref 8–23)
CO2: 22 mmol/L (ref 22–32)
Calcium: 8.3 mg/dL — ABNORMAL LOW (ref 8.9–10.3)
Chloride: 105 mmol/L (ref 98–111)
Creatinine, Ser: 0.84 mg/dL (ref 0.61–1.24)
GFR, Estimated: >60 mL/min (ref >60)
Glucose, Bld: 91 mg/dL (ref 70–99)
Potassium: 4 mmol/L (ref 3.5–5.1)
Sodium: 136 mmol/L (ref 135–145)
Total Bilirubin: 1 mg/dL (ref 0.0–1.2)
Total Protein: 4.8 g/dL — ABNORMAL LOW (ref 6.5–8.1)

## 2023-09-26 LAB — VITAMIN D 25 HYDROXY (VIT D DEFICIENCY, FRACTURES): Vit D, 25-Hydroxy: 27.33 ng/mL — ABNORMAL LOW (ref 30–100)

## 2023-09-26 LAB — SURGICAL PCR SCREEN
MRSA, PCR: NEGATIVE
Staphylococcus aureus: POSITIVE — AB

## 2023-09-26 SURGERY — FIXATION, FRACTURE, INTERTROCHANTERIC, WITH INTRAMEDULLARY ROD
Anesthesia: General | Site: Leg Upper | Laterality: Right

## 2023-09-26 MED ORDER — CHLORHEXIDINE GLUCONATE 4 % EX SOLN: 1.0000 | CUTANEOUS | 1 refills | Status: DC

## 2023-09-26 MED ORDER — METOCLOPRAMIDE HCL 5 MG PO TABS: 5.0000 mg | ORAL_TABLET | Freq: Three times a day (TID) | ORAL | Status: DC | PRN

## 2023-09-26 MED ORDER — ASPIRIN 325 MG PO TABS
325.0000 mg | ORAL_TABLET | Freq: Every day | ORAL | Status: DC
  Administered 2023-09-27 – 2023-09-30 (×4): 325 mg via ORAL
  Filled 2023-09-26 (×4): qty 1

## 2023-09-26 MED ORDER — SUGAMMADEX SODIUM 200 MG/2ML IV SOLN
INTRAVENOUS | Status: AC
  Filled 2023-09-26: qty 4

## 2023-09-26 MED ORDER — LIDOCAINE 2% (20 MG/ML) 5 ML SYRINGE
INTRAMUSCULAR | Status: AC
  Filled 2023-09-26: qty 10

## 2023-09-26 MED ORDER — ONDANSETRON HCL 4 MG/2ML IJ SOLN
INTRAMUSCULAR | Status: AC
  Filled 2023-09-26: qty 4

## 2023-09-26 MED ORDER — ROCURONIUM BROMIDE 10 MG/ML (PF) SYRINGE
PREFILLED_SYRINGE | INTRAVENOUS | Status: AC
  Filled 2023-09-26: qty 20

## 2023-09-26 MED ORDER — POLYETHYLENE GLYCOL 3350 17 G PO PACK: 17.0000 g | PACK | Freq: Every day | ORAL | Status: DC | PRN

## 2023-09-26 MED ORDER — MUPIROCIN 2 % EX OINT: 1.0000 | TOPICAL_OINTMENT | Freq: Two times a day (BID) | CUTANEOUS | 0 refills | Status: DC

## 2023-09-26 MED ORDER — PHENYLEPHRINE 80 MCG/ML (10ML) SYRINGE FOR IV PUSH (FOR BLOOD PRESSURE SUPPORT)
PREFILLED_SYRINGE | INTRAVENOUS | Status: DC | PRN
  Administered 2023-09-26 (×4): 80 ug via INTRAVENOUS

## 2023-09-26 MED ORDER — MUPIROCIN 2 % EX OINT
1.0000 | TOPICAL_OINTMENT | Freq: Two times a day (BID) | CUTANEOUS | Status: DC
Start: 2023-09-26 — End: 2023-09-30
  Administered 2023-09-26 – 2023-09-29 (×9): 1 via NASAL
  Filled 2023-09-26 (×2): qty 22

## 2023-09-26 MED ORDER — ROCURONIUM BROMIDE 10 MG/ML (PF) SYRINGE
PREFILLED_SYRINGE | INTRAVENOUS | Status: DC | PRN
  Administered 2023-09-26: 50 mg via INTRAVENOUS

## 2023-09-26 MED ORDER — CEFAZOLIN SODIUM-DEXTROSE 2-4 GM/100ML-% IV SOLN
2.0000 g | INTRAVENOUS | Status: AC
  Administered 2023-09-26: 2 g via INTRAVENOUS

## 2023-09-26 MED ORDER — LIDOCAINE 2% (20 MG/ML) 5 ML SYRINGE
INTRAMUSCULAR | Status: DC | PRN
Start: 2023-09-26 — End: 2023-09-26
  Administered 2023-09-26: 60 mg via INTRAVENOUS

## 2023-09-26 MED ORDER — SUGAMMADEX SODIUM 200 MG/2ML IV SOLN
INTRAVENOUS | Status: DC | PRN
  Administered 2023-09-26: 150 mg via INTRAVENOUS

## 2023-09-26 MED ORDER — METOCLOPRAMIDE HCL 5 MG/ML IJ SOLN: 5.0000 mg | Freq: Three times a day (TID) | INTRAMUSCULAR | Status: DC | PRN

## 2023-09-26 MED ORDER — ORAL CARE MOUTH RINSE: 15.0000 mL | Freq: Once | OROMUCOSAL | Status: AC

## 2023-09-26 MED ORDER — ENSURE ENLIVE PO LIQD
237.0000 mL | Freq: Two times a day (BID) | ORAL | Status: DC
  Administered 2023-09-27 – 2023-09-30 (×6): 237 mL via ORAL

## 2023-09-26 MED ORDER — GABAPENTIN 300 MG PO CAPS
ORAL_CAPSULE | ORAL | Status: AC
  Administered 2023-09-26: 300 mg via ORAL
  Filled 2023-09-26: qty 1

## 2023-09-26 MED ORDER — 0.9 % SODIUM CHLORIDE (POUR BTL) OPTIME
TOPICAL | Status: DC | PRN
  Administered 2023-09-26: 1000 mL

## 2023-09-26 MED ORDER — CEFAZOLIN SODIUM-DEXTROSE 2-4 GM/100ML-% IV SOLN
INTRAVENOUS | Status: AC
  Filled 2023-09-26: qty 100

## 2023-09-26 MED ORDER — CHLORHEXIDINE GLUCONATE 0.12 % MT SOLN
OROMUCOSAL | Status: AC
  Administered 2023-09-26: 15 mL via OROMUCOSAL
  Filled 2023-09-26: qty 15

## 2023-09-26 MED ORDER — HYDROMORPHONE HCL 1 MG/ML IJ SOLN
INTRAMUSCULAR | Status: DC | PRN
  Administered 2023-09-26 (×2): .5 mg via INTRAVENOUS

## 2023-09-26 MED ORDER — GABAPENTIN 300 MG PO CAPS: 300.0000 mg | ORAL_CAPSULE | Freq: Once | ORAL | Status: AC

## 2023-09-26 MED ORDER — DOCUSATE SODIUM 100 MG PO CAPS
100.0000 mg | ORAL_CAPSULE | Freq: Two times a day (BID) | ORAL | Status: DC
  Administered 2023-09-26 – 2023-09-30 (×8): 100 mg via ORAL
  Filled 2023-09-26 (×9): qty 1

## 2023-09-26 MED ORDER — METHOCARBAMOL 500 MG PO TABS
500.0000 mg | ORAL_TABLET | Freq: Four times a day (QID) | ORAL | Status: DC | PRN
  Filled 2023-09-26: qty 1

## 2023-09-26 MED ORDER — ONDANSETRON HCL 4 MG/2ML IJ SOLN: 4.0000 mg | Freq: Four times a day (QID) | INTRAMUSCULAR | Status: DC | PRN

## 2023-09-26 MED ORDER — DEXAMETHASONE SODIUM PHOSPHATE 10 MG/ML IJ SOLN
INTRAMUSCULAR | Status: AC
  Filled 2023-09-26: qty 1

## 2023-09-26 MED ORDER — EPHEDRINE 5 MG/ML INJ
INTRAVENOUS | Status: AC
  Filled 2023-09-26: qty 5

## 2023-09-26 MED ORDER — HYDROMORPHONE HCL 1 MG/ML IJ SOLN: 0.2500 mg | INTRAMUSCULAR | Status: DC | PRN

## 2023-09-26 MED ORDER — ONDANSETRON HCL 4 MG/2ML IJ SOLN
INTRAMUSCULAR | Status: DC | PRN
Start: 2023-09-26 — End: 2023-09-26
  Administered 2023-09-26: 4 mg via INTRAVENOUS

## 2023-09-26 MED ORDER — PROPOFOL 10 MG/ML IV BOLUS
INTRAVENOUS | Status: AC
  Filled 2023-09-26: qty 20

## 2023-09-26 MED ORDER — POVIDONE-IODINE 10 % EX SWAB
2.0000 | Freq: Once | CUTANEOUS | Status: AC
  Administered 2023-09-26: 2 via TOPICAL

## 2023-09-26 MED ORDER — CHLORHEXIDINE GLUCONATE 4 % EX SOLN
60.0000 mL | Freq: Once | CUTANEOUS | Status: AC
  Administered 2023-09-26: 4 via TOPICAL
  Filled 2023-09-26: qty 15

## 2023-09-26 MED ORDER — HYDROMORPHONE HCL 1 MG/ML IJ SOLN
INTRAMUSCULAR | Status: AC
  Filled 2023-09-26: qty 0.5

## 2023-09-26 MED ORDER — METHOCARBAMOL 1000 MG/10ML IJ SOLN: 500.0000 mg | Freq: Four times a day (QID) | INTRAMUSCULAR | Status: DC | PRN

## 2023-09-26 MED ORDER — CHLORHEXIDINE GLUCONATE 0.12 % MT SOLN: 15.0000 mL | Freq: Once | OROMUCOSAL | Status: AC

## 2023-09-26 MED ORDER — TRANEXAMIC ACID-NACL 1000-0.7 MG/100ML-% IV SOLN
1000.0000 mg | Freq: Once | INTRAVENOUS | Status: AC
  Administered 2023-09-26: 1000 mg via INTRAVENOUS
  Filled 2023-09-26: qty 100

## 2023-09-26 MED ORDER — PHENYLEPHRINE HCL-NACL 20-0.9 MG/250ML-% IV SOLN
INTRAVENOUS | Status: DC | PRN
  Administered 2023-09-26: 25 ug/min via INTRAVENOUS

## 2023-09-26 MED ORDER — PROPOFOL 10 MG/ML IV BOLUS
INTRAVENOUS | Status: DC | PRN
  Administered 2023-09-26: 100 mg via INTRAVENOUS

## 2023-09-26 MED ORDER — DROPERIDOL 2.5 MG/ML IJ SOLN: 0.6250 mg | Freq: Once | INTRAMUSCULAR | Status: DC | PRN

## 2023-09-26 MED ORDER — CEFAZOLIN SODIUM-DEXTROSE 2-4 GM/100ML-% IV SOLN
2.0000 g | Freq: Three times a day (TID) | INTRAVENOUS | Status: AC
  Administered 2023-09-26 – 2023-09-27 (×3): 2 g via INTRAVENOUS
  Filled 2023-09-26 (×3): qty 100

## 2023-09-26 MED ORDER — HYDROMORPHONE HCL 1 MG/ML IJ SOLN
INTRAMUSCULAR | Status: AC
Start: 2023-09-26 — End: ?
  Filled 2023-09-26: qty 0.5

## 2023-09-26 MED ORDER — LACTATED RINGERS IV SOLN: INTRAVENOUS | Status: DC

## 2023-09-26 MED ORDER — PHENYLEPHRINE 80 MCG/ML (10ML) SYRINGE FOR IV PUSH (FOR BLOOD PRESSURE SUPPORT)
PREFILLED_SYRINGE | INTRAVENOUS | Status: AC
  Filled 2023-09-26: qty 10

## 2023-09-26 MED ORDER — ONDANSETRON HCL 4 MG PO TABS: 4.0000 mg | ORAL_TABLET | Freq: Four times a day (QID) | ORAL | Status: DC | PRN

## 2023-09-26 SURGICAL SUPPLY — 42 items
BAG COUNTER SPONGE SURGICOUNT (BAG) IMPLANT
BIT DRILL INTERTAN LAG SCREW (BIT) IMPLANT
BIT DRILL LONG 4.0 (BIT) IMPLANT
BRUSH SCRUB EZ PLAIN DRY (MISCELLANEOUS) ×4 IMPLANT
CHLORAPREP W/TINT 26 (MISCELLANEOUS) ×2 IMPLANT
COVER PERINEAL POST (MISCELLANEOUS) ×2 IMPLANT
COVER SURGICAL LIGHT HANDLE (MISCELLANEOUS) ×2 IMPLANT
DERMABOND ADVANCED .7 DNX12 (GAUZE/BANDAGES/DRESSINGS) ×2 IMPLANT
DRAPE C-ARM 35X43 STRL (DRAPES) ×2 IMPLANT
DRAPE IMP U-DRAPE 54X76 (DRAPES) ×4 IMPLANT
DRAPE INCISE IOBAN 66X45 STRL (DRAPES) ×2 IMPLANT
DRAPE STERI IOBAN 125X83 (DRAPES) ×2 IMPLANT
DRAPE SURG 17X23 STRL (DRAPES) ×4 IMPLANT
DRAPE U-SHAPE 47X51 STRL (DRAPES) ×2 IMPLANT
DRESSING MEPILEX FLEX 4X4 (GAUZE/BANDAGES/DRESSINGS) ×2 IMPLANT
DRILL BIT LONG 4.0 (BIT) ×1 IMPLANT
DRSG MEPILEX FLEX 4X4 (GAUZE/BANDAGES/DRESSINGS) ×1 IMPLANT
DRSG MEPILEX POST OP 4X8 (GAUZE/BANDAGES/DRESSINGS) ×2 IMPLANT
ELECT REM PT RETURN 9FT ADLT (ELECTROSURGICAL) ×1 IMPLANT
ELECTRODE REM PT RTRN 9FT ADLT (ELECTROSURGICAL) ×2 IMPLANT
GLOVE BIO SURGEON STRL SZ 6.5 (GLOVE) ×6 IMPLANT
GLOVE BIO SURGEON STRL SZ7.5 (GLOVE) ×8 IMPLANT
GLOVE BIOGEL PI IND STRL 6.5 (GLOVE) ×2 IMPLANT
GLOVE BIOGEL PI IND STRL 7.5 (GLOVE) ×2 IMPLANT
GOWN STRL REUS W/ TWL LRG LVL3 (GOWN DISPOSABLE) ×2 IMPLANT
GUIDE PIN 3.2X343 (PIN) ×2 IMPLANT
KIT BASIN OR (CUSTOM PROCEDURE TRAY) ×2 IMPLANT
KIT TURNOVER KIT B (KITS) ×2 IMPLANT
MANIFOLD NEPTUNE II (INSTRUMENTS) ×2 IMPLANT
NAIL INTERTAN 10X18 130D 10S (Nail) IMPLANT
NS IRRIG 1000ML POUR BTL (IV SOLUTION) ×2 IMPLANT
PACK GENERAL/GYN (CUSTOM PROCEDURE TRAY) ×2 IMPLANT
PAD ARMBOARD 7.5X6 YLW CONV (MISCELLANEOUS) ×4 IMPLANT
PIN GUIDE 3.2X343MM (PIN) IMPLANT
SCREW LAG COMPR KIT 100/95 (Screw) IMPLANT
SCREW TRIGEN LOW PROF 5.0X32.5 (Screw) IMPLANT
SUT MNCRL AB 3-0 PS2 18 (SUTURE) ×2 IMPLANT
SUT MON AB 2-0 CT1 36 (SUTURE) IMPLANT
SUT VIC AB 0 CT1 27XBRD ANBCTR (SUTURE) IMPLANT
SUT VIC AB 2-0 CT1 TAPERPNT 27 (SUTURE) ×4 IMPLANT
TOWEL GREEN STERILE (TOWEL DISPOSABLE) ×4 IMPLANT
WATER STERILE IRR 1000ML POUR (IV SOLUTION) ×2 IMPLANT

## 2023-09-26 NOTE — Interval H&P Note (Signed)
History and Physical Interval Note:  09/26/2023 8:46 AM  Ernest Campbell  has presented today for surgery, with the diagnosis of Right intertrochanteric femur fracture.  The various methods of treatment have been discussed with the patient and family. After consideration of risks, benefits and other options for treatment, the patient has consented to  Procedure(s): INTRAMEDULLARY (IM) NAIL INTERTROCHANTERIC (Right) as a surgical intervention.  The patient's history has been reviewed, patient examined, no change in status, stable for surgery.  I have reviewed the patient's chart and labs.  Questions were answered to the patient's satisfaction.     Caryn Bee P Nanda Bittick

## 2023-09-26 NOTE — Progress Notes (Signed)
  Progress Note   Patient: Ernest Campbell:[id number] DOB: 1950/08/22 DOA: 09/25/2023     1 DOS: the patient was seen and examined on 09/26/2023   Brief hospital course: 73 y.o. male with medical history significant of CVA, dementia, parkinsonism, traumatic brain injury who was brought from his facility due to having a fall in the morning and no LOC, but the patient was unable to get up on his own due to right hip pain.  He is unable to provide any more history at the moment.   Assessment and Plan: Closed right hip fracture, initial encounter [facility]) -Orthopedic Surgery consulted. Pt underwent nailing of R intertrochanteric femur fracture 2/20 -Continue analgesia  -f/u with PT/OT eval   Active Problems:   Other secondary parkinsonism (HCC) Continue Sinemet IR 25-100 mg p.o. 3 times daily. Follow-up with neurology as an outpatient.     TBI (traumatic brain injury) [facility])   CVA (cerebral vascular accident) (HCC) Supportive care. Reorient as needed. Continue memantine 5 mg p.o. twice daily.     Macrocytic anemia Monitor hematocrit hemoglobin.     Hypocalcemia Corrected calcium, accounting for hypoalbuminemia, is 9.58  Hypoalbuminemia      Subjective: Pt seen post-op. Remains somewhat sedated  Physical Exam: Vitals:   09/26/23 1045 09/26/23 1100 09/26/23 1133 09/26/23 1750  BP: (!) 99/56 (!) 104/57 (!) 103/58 (!) 110/59  Pulse: 80 88 86 95  Resp: 13 12 14 15   Temp:  98.1 F (36.7 C) 98.1 F (36.7 C) 98.2 F (36.8 C)  TempSrc:   Axillary Axillary  SpO2: 96% 93% 93% 95%  Weight:      Height:       General exam: Asleep, laying in bed, in nad Respiratory system: Normal respiratory effort, no wheezing Cardiovascular system: regular rate, s1, s2 Gastrointestinal system: Soft, nondistended, positive BS Central nervous system: CN2-12 grossly intact, strength intact Extremities: Perfused, no clubbing Skin: Normal skin turgor, no notable skin lesions seen Psychiatry: unable  to assess given mentation  Data Reviewed:  Labs reviewed: Na 136, K 4.0, Cr 0.84, WBC 9.1, Hgb 10.4  Family Communication: Pt in room, family not at bedside  Disposition: Status is: Inpatient Remains inpatient appropriate because: severity of illness  Planned Discharge Destination:  Pending PT eval    Author: Rickey Barbara, MD 09/26/2023 5:57 PM  For on call review [email or web address].

## 2023-09-26 NOTE — Anesthesia Procedure Notes (Signed)
Procedure Name: Intubation Date/Time: 09/26/2023 9:33 AM  Performed by: Yolonda Kida, CRNAPre-anesthesia Checklist: Patient identified, Emergency Drugs available, Suction available and Patient being monitored Patient Re-evaluated:Patient Re-evaluated prior to induction Oxygen Delivery Method: Circle System Utilized Preoxygenation: Pre-oxygenation with 100% oxygen Induction Type: IV induction Ventilation: Mask ventilation without difficulty Laryngoscope Size: Mac and 4 Grade View: Grade I Tube type: Oral Tube size: 7.5 mm Number of attempts: 1 Airway Equipment and Method: Stylet Placement Confirmation: ETT inserted through vocal cords under direct vision, positive ETCO2 and breath sounds checked- equal and bilateral Secured at: 22 cm Tube secured with: Tape Dental Injury: Teeth and Oropharynx as per pre-operative assessment  Comments: Upper dentures removed during DL and given to circulator RN. No damage done to dentures.

## 2023-09-26 NOTE — Consult Note (Signed)
Orthopaedic Trauma Service (OTS) Consult   Patient ID: Ernest Campbell MRN: [id number] DOB/AGE: 71/31/1952 71 y.o.  Reason for Consult:Right intertrochanteric femur fracture Referring Physician: Dr. Dorthula Nettles, MD [facility]  HPI: Ernest Campbell is an 73 y.o. male who is being seen in consultation at the request of Dr. Dion Saucier for evaluation of right intertrochanteric femur fracture.  Patient was at home he is recently discharged from [facility] with a right intertrochanteric femur fracture presents emergency room and due to the OR availability ability Dr. Dion Saucier asked that I take over his care.  Patient's wife is at bedside and notes that he had a cluster migraine about 2 months ago for which she had a pain cocktail given to him in the emergency room with fentanyl that caused severe confusion and he is just recovering from that.  The wife provides most of the history.  The patient does awaken and open his eyes and does respond intermittently to questions.  At baseline he does use assist device but he does have some history of back pain.  Past Medical History:  Diagnosis Date   Dementia (HCC)     History reviewed. No pertinent surgical history.  Family History  Problem Relation Age of Onset   Dementia Mother    Parkinsonism Father    Stroke Father    Breast cancer Sister     Social History:  reports that he has been smoking cigarettes. He does not have any smokeless tobacco history on file. He reports that he does not drink alcohol and does not use drugs.  Allergies:  Allergies  Allergen Reactions   Fentanyl And Related Anaphylaxis   Other Other (See Comments)    According to the patient, all opioids cause hallucinations   Prednisone Other (See Comments)    Hallucinations    Acetaminophen     Hallucinations   Codeine Other (See Comments)    Hallucinations   Morphine Other (See Comments)    Hallucinations   Oxycodone Other (See Comments)    Hallucinations     Medications:  No current facility-administered medications on file prior to encounter.   Current Outpatient Medications on File Prior to Encounter  Medication Sig Dispense Refill   aspirin EC 81 MG tablet Take 1 tablet (81 mg total) by mouth daily. Swallow whole. 30 tablet 12   carbidopa-levodopa (SINEMET IR) 25-100 MG tablet Take 1 tablet by mouth 3 (three) times daily with meals. 270 tablet 3   haloperidol (HALDOL) 10 MG tablet Take 10 mg by mouth every 8 (eight) hours as needed (agitation).     ibuprofen (ADVIL) 200 MG tablet Take 600 mg by mouth 2 (two) times daily as needed (for back pain).     memantine (NAMENDA) 5 MG tablet Take 1 tablet (5 mg total) by mouth 2 (two) times daily. 60 tablet 5     ROS: Unable to obtain due to the patient's mental status.  Exam: Blood pressure 118/64, pulse 80, temperature 98.1 F (36.7 C), resp. rate 18, height 5\' 6"  (1.676 m), weight 67 kg, SpO2 97%. General: No acute distress Orientation: Awake but not oriented Mood and Affect: Does not really engage or interact for the exam Gait: Unable to assess due to his fracture Coordination and balance: Unable to fully assess.  Right lower extremity: Leg is not significantly shortened.  I did not mobilize his leg.  No skin lesions noted.  Compartments are soft compressible.  He has dorsiflexion and plantarflexion of his foot  and ankle.  He has 2+ DP and PT pulses.  He has warm well-perfused foot.  Follow commands to know if he endorses sensation to the foot.  Left lower extremity: Skin without lesions. No tenderness to palpation. Full painless ROM, full strength in each muscle groups without evidence of instability.   Medical Decision Making: Data: Imaging: X-rays and CT scan are reviewed which shows a right intertrochanteric femur fracture.  Labs:  Results for orders placed or performed during the hospital encounter of 09/25/23 (from the past 24 hours)  CBC with Differential/Platelet     Status:  Abnormal   Collection Time: 09/25/23  9:25 AM  Result Value Ref Range   WBC 10.5 4.0 - 10.5 K/uL   RBC 3.39 (L) 4.22 - 5.81 MIL/uL   Hemoglobin 11.3 (L) 13.0 - 17.0 g/dL   HCT 16.1 (L) 09.6 - 04.5 %   MCV 101.5 (H) 80.0 - 100.0 fL   MCH 33.3 26.0 - 34.0 pg   MCHC 32.8 30.0 - 36.0 g/dL   RDW 40.9 81.1 - 91.4 %   Platelets 313 150 - 400 K/uL   nRBC 0.0 0.0 - 0.2 %   Neutrophils Relative % 58 %   Neutro Abs 6.1 1.7 - 7.7 K/uL   Lymphocytes Relative 23 %   Lymphs Abs 2.4 0.7 - 4.0 K/uL   Monocytes Relative 12 %   Monocytes Absolute 1.3 (H) 0.1 - 1.0 K/uL   Eosinophils Relative 1 %   Eosinophils Absolute 0.1 0.0 - 0.5 K/uL   Basophils Relative 1 %   Basophils Absolute 0.1 0.0 - 0.1 K/uL   Immature Granulocytes 5 %   Abs Immature Granulocytes 0.57 (H) 0.00 - 0.07 K/uL  Basic metabolic panel     Status: Abnormal   Collection Time: 09/25/23  9:25 AM  Result Value Ref Range   Sodium 137 135 - 145 mmol/L   Potassium 3.9 3.5 - 5.1 mmol/L   Chloride 105 98 - 111 mmol/L   CO2 26 22 - 32 mmol/L   Glucose, Bld 105 (H) 70 - 99 mg/dL   BUN 16 8 - 23 mg/dL   Creatinine, Ser 7.82 0.61 - 1.24 mg/dL   Calcium 8.5 (L) 8.9 - 10.3 mg/dL   GFR, Estimated >95 >62 mL/min   Anion gap 6 5 - 15  Surgical PCR screen     Status: Abnormal   Collection Time: 09/26/23 12:25 AM   Specimen: Nasal Mucosa; Nasal Swab  Result Value Ref Range   MRSA, PCR NEGATIVE NEGATIVE   Staphylococcus aureus POSITIVE (A) NEGATIVE  CBC     Status: Abnormal   Collection Time: 09/26/23  4:42 AM  Result Value Ref Range   WBC 9.1 4.0 - 10.5 K/uL   RBC 3.16 (L) 4.22 - 5.81 MIL/uL   Hemoglobin 10.4 (L) 13.0 - 17.0 g/dL   HCT 13.0 (L) 86.5 - 78.4 %   MCV 96.2 80.0 - 100.0 fL   MCH 32.9 26.0 - 34.0 pg   MCHC 34.2 30.0 - 36.0 g/dL   RDW 69.6 29.5 - 28.4 %   Platelets 278 150 - 400 K/uL   nRBC 0.0 0.0 - 0.2 %  Comprehensive metabolic panel     Status: Abnormal   Collection Time: 09/26/23  4:42 AM  Result Value Ref Range    Sodium 136 135 - 145 mmol/L   Potassium 4.0 3.5 - 5.1 mmol/L   Chloride 105 98 - 111 mmol/L   CO2 22 22 -  32 mmol/L   Glucose, Bld 91 70 - 99 mg/dL   BUN 14 8 - 23 mg/dL   Creatinine, Ser 2.44 0.61 - 1.24 mg/dL   Calcium 8.3 (L) 8.9 - 10.3 mg/dL   Total Protein 4.8 (L) 6.5 - 8.1 g/dL   Albumin 2.4 (L) 3.5 - 5.0 g/dL   AST 18 15 - 41 U/L   ALT 44 0 - 44 U/L   Alkaline Phosphatase 66 38 - 126 U/L   Total Bilirubin 1.0 0.0 - 1.2 mg/dL   GFR, Estimated >01 >02 mL/min   Anion gap 9 5 - 15     Imaging or Labs ordered: None   Medical history and chart was reviewed and case discussed with medical provider.  Assessment/Plan: 73 year old male with a right intertrochanteric femur fracture.  Due to the unstable nature of his injury I recommend proceeding with cephalomedullary nailing of his right hip.  Risks and benefits were discussed with the patient.  Risks include but not limited to bleeding, infection, malunion, nonunion, hardware failure, hardware rotation, nerve and blood vessel injury, DVT, even the possibility anesthetic complications.  His wife agrees to proceed with surgery and consent was obtained.  Roby Lofts, MD [facility] [phone] (office) [email or web address]

## 2023-09-26 NOTE — Hospital Course (Signed)
73 y.o. male with medical history significant of CVA, dementia, parkinsonism, traumatic brain injury who was brought from his facility due to having a fall in the morning and no LOC, but the patient was unable to get up on his own due to right hip pain.  He is unable to provide any more history at the moment.

## 2023-09-26 NOTE — Op Note (Signed)
Orthopaedic Surgery Operative Note (CSN: [id number] ) Date of Surgery: 09/26/2023  Admit Date: 09/25/2023   Diagnoses: Pre-Op Diagnoses: Right intertrochanteric femur fracture  Post-Op Diagnosis: Same  Procedures: CPT 27245-Cephalomedullary nailing of right intertrochanteric femur fracture  Surgeons : Primary: Roby Lofts, MD  Assistant: Thyra Breed, PA-C  Location: OR 3   Anesthesia: General   Antibiotics: Ancef 2g preop   Tourniquet time: None    Estimated Blood Loss: 50 mL  Complications: None   Specimens:* No specimens in log *   Implants: Implant Name Type Inv. Item Serial No. Manufacturer Lot No. LRB No. Used Action  NAIL INTERTAN 10X18 130D 10S - [id number] Nail NAIL INTERTAN 10X18 130D 10S  [employer] [id number] Right 1 Implanted  SCREW LAG COMPR KIT 100/95 - [id number] Screw SCREW LAG COMPR KIT 100/95  [employer] [id number] Right 1 Implanted  SCREW TRIGEN LOW PROF 5.0X32.5 - [id number] Screw SCREW TRIGEN LOW PROF 5.0X32.5  [employer] [id number] Right 1 Implanted     Indications for Surgery: 73 year old male who sustained a ground-level fall with a right intertrochanteric femur fracture.  Due to the unstable nature of his injury I recommend proceeding with cephalomedullary nailing of his right hip.  Risks and benefits were discussed with the patient's wife.  Risks included but not limited to bleeding, infection, malunion, nonunion, hardware failure, hardware rotation, nerve and blood vessel injury, DVT, even possibility anesthetic complications.  She agreed to proceed with surgery and consent was obtained.  Operative Findings: Cephalomedullary nailing of right intertrochanteric femur fracture using [employer] InterTAN 10 x 180 mm nail with a 100 mm lag screw and a 95 mm compression screw.  Procedure: The patient was identified in the preoperative holding area. Consent was confirmed with the patient and  their family and all questions were answered. The operative extremity was marked after confirmation with the patient. he was then brought back to the operating room by our anesthesia colleagues.  He was placed under general anesthetic and carefully transferred over to a [place]ana table.  All bony prominences were well-padded.  Traction was applied to the right lower extremity and fluoroscopic imaging showed adequate alignment of the fracture.  A timeout was performed to verify the patient, the procedure, and the extremity.  Preoperative antibiotics were dosed.  Small incision proximal to the greater trochanter was made and carried down through skin subcutaneous tissue.  Threaded guidewire was directed at the tip of the greater trochanter and advanced into the proximal metaphysis.  An entry reamer was then used to enter the medullary canal.  I then placed a 10 x 180 mm nail attached to a targeting arm into the center of the canal.  I then used the targeting arm to direct a threaded guidewire up into the head/neck segment of the femur.  I confirmed adequate tip apex distance using fluoroscopy.  I then measured the length and decided to use to the 100 mm lag screw.  I then drilled the path for the compression screw and placed an antirotation bar.  I then drilled the path for the lag screw and placed 100 mm lag screw.  I then placed a compression screw and compressed approximately 3 mm.  I then statically locked the proximal portion of the nail.   I then used the targeting arm to place a lateral to medial distal interlocking screw.  The targeting arm was removed and final fluoroscopic imaging was obtained.  The incisions were irrigated  and closed with 2-0 Monocryl and Dermabond.  Sterile dressings were applied.  The patient was then awoke from anesthesia and taken to the PACU in stable condition.  Post Op Plan/Instructions: Patient be weightbearing as tolerated to the right lower extremity.  He will receive  postoperative Ancef.  He will be placed on aspirin for DVT prophylaxis.  Will have him mobilize with physical and Occupational Therapy.  I was present and performed the entire surgery.  Thyra Breed, PA-C did assist me throughout the case. An assistant was necessary given the difficulty in approach, maintenance of reduction and ability to instrument the fracture.   Truitt Merle, MD [facility]

## 2023-09-26 NOTE — Transfer of Care (Signed)
Immediate Anesthesia Transfer of Care Note  Patient: Ernest Campbell  Procedure(s) Performed: INTRAMEDULLARY (IM) NAIL INTERTROCHANTERIC (Right: Leg Upper)  Patient Location: PACU  Anesthesia Type:General  Level of Consciousness: drowsy  Airway & Oxygen Therapy: Patient Spontanous Breathing and Patient connected to face mask oxygen  Post-op Assessment: Report given to RN and Post -op Vital signs reviewed and stable  Post vital signs: Reviewed and stable  Last Vitals:  Vitals Value Taken Time  BP 116/53 09/26/23 1030  Temp    Pulse 80 09/26/23 1032  Resp 16 09/26/23 1032  SpO2 100 % 09/26/23 1032  Vitals shown include unfiled device data.  Last Pain:  Vitals:   09/26/23 0329  TempSrc: Oral  PainSc:          Complications: No notable events documented.

## 2023-09-26 NOTE — Progress Notes (Signed)
Called 5N to attempt report x3 for patient's surgery. No answer.

## 2023-09-26 NOTE — Progress Notes (Signed)
   09/26/23 1133  Assess: MEWS Score  Temp 98.1 F (36.7 C)  BP (!) 103/58  MAP (mmHg) 70  Pulse Rate 86  Resp 14  Level of Consciousness Responds to Pain  SpO2 93 %  O2 Device Room Air  Assess: MEWS Score  MEWS Temp 0  MEWS Systolic 0  MEWS Pulse 0  MEWS RR 0  MEWS LOC 2  MEWS Score 2  MEWS Score Color Yellow  Assess: if the MEWS score is Yellow or Red  Were vital signs accurate and taken at a resting state? Yes  Does the patient meet 2 or more of the SIRS criteria? No  MEWS guidelines implemented  Yes, yellow  Treat  MEWS Interventions Considered administering scheduled or prn medications/treatments as ordered  Take Vital Signs  Increase Vital Sign Frequency  Yellow: Q2hr x1, continue Q4hrs until patient remains green for 12hrs  Escalate  MEWS: Escalate Yellow: Discuss with charge nurse and consider notifying provider and/or RRT  Notify: Charge Nurse/RN  Name of Charge Nurse/RN Notified Angelito, RN  Assess: SIRS CRITERIA  SIRS Temperature  0  SIRS Respirations  0  SIRS Pulse 0  SIRS WBC 0  SIRS Score Sum  0

## 2023-09-26 NOTE — Progress Notes (Signed)
Initial Nutrition Assessment DOCUMENTATION CODES:  Severe malnutrition in context of chronic illness  INTERVENTION:  Ensure Enlive po BID, each supplement provides 350 kcal and 20 grams of protein.  Magic cup TID with meals, each supplement provides 290 kcal and 9 grams of protein  Continue regular diet as ordered (meal ordering with assistance)  NUTRITION DIAGNOSIS:  Severe Malnutrition related to chronic illness (dementia, Parkinson's) as evidenced by severe fat depletion, severe muscle depletion.  GOAL:  Patient will meet greater than or equal to 90% of their needs  MONITOR:  PO intake, Supplement acceptance  REASON FOR ASSESSMENT:  Consult Assessment of nutrition requirement/status  ASSESSMENT:  Pt with hx of significant CVA, Parkinson's, dementia, and TBI. Pt admitted post fall with R hip fx.  Pt received intramedullary nail intertrochanteric surgery on R hip 2/20. Currently on clear liquid diet with hopes to advance.  Pt asleep during assessment and did not wake easily. Conducted NFPE, but unable to retrieve any pt or dietary history at this time. NFPE shows severe muscle and fat depletion.  Medications reviewed and include: memantine, sinemet  Labs reviewed: Calcium 8.3, albumin 2.4  NUTRITION - FOCUSED PHYSICAL EXAM: Flowsheet Row Most Recent Value  Orbital Region Severe depletion  Upper Arm Region Severe depletion  Thoracic and Lumbar Region Severe depletion  Buccal Region Severe depletion  Temple Region Severe depletion  Clavicle Bone Region Severe depletion  Clavicle and Acromion Bone Region Severe depletion  Scapular Bone Region Severe depletion  Dorsal Hand Severe depletion  Patellar Region Severe depletion  Anterior Thigh Region Severe depletion  Posterior Calf Region Severe depletion  Edema (RD Assessment) None  Hair Reviewed  Eyes Reviewed  Mouth Reviewed  [missing teeth, unable to confirm dentures]  Skin Reviewed  Nails Reviewed  [pale]   Diet  Order:   Diet Order             Diet regular Room service appropriate? Yes; Fluid consistency: Thin  Diet effective now                  EDUCATION NEEDS:  Not appropriate for education at this time  Skin:  Skin Assessment: Skin Integrity Issues: Skin Integrity Issues:: Stage I, Incisions Stage I: sacrum pressure wound Incisions: R hip  Last BM:  per RN note, last bm 2/18  Height:  Ht Readings from Last 1 Encounters:  09/26/23 5\' 6"  (1.676 m)   Weight:  Wt Readings from Last 1 Encounters:  09/26/23 67 kg   Ideal Body Weight:  65.5 kg  BMI:  Body mass index is 23.84 kg/m.  Estimated Nutritional Needs:   Kcal:  1800-2000  Protein:  90-100g  Fluid:  >/= 1.8L  Louis Meckel Dietetic Intern

## 2023-09-26 NOTE — H&P (View-Only) (Signed)
Orthopaedic Trauma Service (OTS) Consult   Patient ID: Ernest Campbell MRN: [id number] DOB/AGE: 71/31/1952 71 y.o.  Reason for Consult:Right intertrochanteric femur fracture Referring Physician: Dr. Dorthula Nettles, MD [facility]  HPI: Ernest Campbell is an 73 y.o. male who is being seen in consultation at the request of Dr. Dion Saucier for evaluation of right intertrochanteric femur fracture.  Patient was at home he is recently discharged from [place] [facility] with a right intertrochanteric femur fracture presents emergency room and due to the OR availability ability Dr. Dion Saucier asked that I take over his care.  Patient's wife is at bedside and notes that he had a cluster migraine about 2 months ago for which she had a pain cocktail given to him in the emergency room with fentanyl that caused severe confusion and he is just recovering from that.  The wife provides most of the history.  The patient does awaken and open his eyes and does respond intermittently to questions.  At baseline he does use assist device but he does have some history of back pain.  Past Medical History:  Diagnosis Date   Dementia (HCC)     History reviewed. No pertinent surgical history.  Family History  Problem Relation Age of Onset   Dementia Mother    Parkinsonism Father    Stroke Father    Breast cancer Sister     Social History:  reports that he has been smoking cigarettes. He does not have any smokeless tobacco history on file. He reports that he does not drink alcohol and does not use drugs.  Allergies:  Allergies  Allergen Reactions   Fentanyl And Related Anaphylaxis   Other Other (See Comments)    According to the patient, all opioids cause hallucinations   Prednisone Other (See Comments)    Hallucinations    Acetaminophen     Hallucinations   Codeine Other (See Comments)    Hallucinations   Morphine Other (See Comments)    Hallucinations   Oxycodone Other (See Comments)    Hallucinations     Medications:  No current facility-administered medications on file prior to encounter.   Current Outpatient Medications on File Prior to Encounter  Medication Sig Dispense Refill   aspirin EC 81 MG tablet Take 1 tablet (81 mg total) by mouth daily. Swallow whole. 30 tablet 12   carbidopa-levodopa (SINEMET IR) 25-100 MG tablet Take 1 tablet by mouth 3 (three) times daily with meals. 270 tablet 3   haloperidol (HALDOL) 10 MG tablet Take 10 mg by mouth every 8 (eight) hours as needed (agitation).     ibuprofen (ADVIL) 200 MG tablet Take 600 mg by mouth 2 (two) times daily as needed (for back pain).     memantine (NAMENDA) 5 MG tablet Take 1 tablet (5 mg total) by mouth 2 (two) times daily. 60 tablet 5     ROS: Unable to obtain due to the patient's mental status.  Exam: Blood pressure 118/64, pulse 80, temperature 98.1 F (36.7 C), resp. rate 18, height 5\' 6"  (1.676 m), weight 67 kg, SpO2 97%. General: No acute distress Orientation: Awake but not oriented Mood and Affect: Does not really engage or interact for the exam Gait: Unable to assess due to his fracture Coordination and balance: Unable to fully assess.  Right lower extremity: Leg is not significantly shortened.  I did not mobilize his leg.  No skin lesions noted.  Compartments are soft compressible.  He has dorsiflexion and plantarflexion of his foot  and ankle.  He has 2+ DP and PT pulses.  He has warm well-perfused foot.  Follow commands to know if he endorses sensation to the foot.  Left lower extremity: Skin without lesions. No tenderness to palpation. Full painless ROM, full strength in each muscle groups without evidence of instability.   Medical Decision Making: Data: Imaging: X-rays and CT scan are reviewed which shows a right intertrochanteric femur fracture.  Labs:  Results for orders placed or performed during the hospital encounter of 09/25/23 (from the past 24 hours)  CBC with Differential/Platelet     Status:  Abnormal   Collection Time: 09/25/23  9:25 AM  Result Value Ref Range   WBC 10.5 4.0 - 10.5 K/uL   RBC 3.39 (L) 4.22 - 5.81 MIL/uL   Hemoglobin 11.3 (L) 13.0 - 17.0 g/dL   HCT 16.1 (L) 09.6 - 04.5 %   MCV 101.5 (H) 80.0 - 100.0 fL   MCH 33.3 26.0 - 34.0 pg   MCHC 32.8 30.0 - 36.0 g/dL   RDW 40.9 81.1 - 91.4 %   Platelets 313 150 - 400 K/uL   nRBC 0.0 0.0 - 0.2 %   Neutrophils Relative % 58 %   Neutro Abs 6.1 1.7 - 7.7 K/uL   Lymphocytes Relative 23 %   Lymphs Abs 2.4 0.7 - 4.0 K/uL   Monocytes Relative 12 %   Monocytes Absolute 1.3 (H) 0.1 - 1.0 K/uL   Eosinophils Relative 1 %   Eosinophils Absolute 0.1 0.0 - 0.5 K/uL   Basophils Relative 1 %   Basophils Absolute 0.1 0.0 - 0.1 K/uL   Immature Granulocytes 5 %   Abs Immature Granulocytes 0.57 (H) 0.00 - 0.07 K/uL  Basic metabolic panel     Status: Abnormal   Collection Time: 09/25/23  9:25 AM  Result Value Ref Range   Sodium 137 135 - 145 mmol/L   Potassium 3.9 3.5 - 5.1 mmol/L   Chloride 105 98 - 111 mmol/L   CO2 26 22 - 32 mmol/L   Glucose, Bld 105 (H) 70 - 99 mg/dL   BUN 16 8 - 23 mg/dL   Creatinine, Ser 7.82 0.61 - 1.24 mg/dL   Calcium 8.5 (L) 8.9 - 10.3 mg/dL   GFR, Estimated >95 >62 mL/min   Anion gap 6 5 - 15  Surgical PCR screen     Status: Abnormal   Collection Time: 09/26/23 12:25 AM   Specimen: Nasal Mucosa; Nasal Swab  Result Value Ref Range   MRSA, PCR NEGATIVE NEGATIVE   Staphylococcus aureus POSITIVE (A) NEGATIVE  CBC     Status: Abnormal   Collection Time: 09/26/23  4:42 AM  Result Value Ref Range   WBC 9.1 4.0 - 10.5 K/uL   RBC 3.16 (L) 4.22 - 5.81 MIL/uL   Hemoglobin 10.4 (L) 13.0 - 17.0 g/dL   HCT 13.0 (L) 86.5 - 78.4 %   MCV 96.2 80.0 - 100.0 fL   MCH 32.9 26.0 - 34.0 pg   MCHC 34.2 30.0 - 36.0 g/dL   RDW 69.6 29.5 - 28.4 %   Platelets 278 150 - 400 K/uL   nRBC 0.0 0.0 - 0.2 %  Comprehensive metabolic panel     Status: Abnormal   Collection Time: 09/26/23  4:42 AM  Result Value Ref Range    Sodium 136 135 - 145 mmol/L   Potassium 4.0 3.5 - 5.1 mmol/L   Chloride 105 98 - 111 mmol/L   CO2 22 22 -  32 mmol/L   Glucose, Bld 91 70 - 99 mg/dL   BUN 14 8 - 23 mg/dL   Creatinine, Ser 2.44 0.61 - 1.24 mg/dL   Calcium 8.3 (L) 8.9 - 10.3 mg/dL   Total Protein 4.8 (L) 6.5 - 8.1 g/dL   Albumin 2.4 (L) 3.5 - 5.0 g/dL   AST 18 15 - 41 U/L   ALT 44 0 - 44 U/L   Alkaline Phosphatase 66 38 - 126 U/L   Total Bilirubin 1.0 0.0 - 1.2 mg/dL   GFR, Estimated >01 >02 mL/min   Anion gap 9 5 - 15     Imaging or Labs ordered: None   Medical history and chart was reviewed and case discussed with medical provider.  Assessment/Plan: 73 year old male with a right intertrochanteric femur fracture.  Due to the unstable nature of his injury I recommend proceeding with cephalomedullary nailing of his right hip.  Risks and benefits were discussed with the patient.  Risks include but not limited to bleeding, infection, malunion, nonunion, hardware failure, hardware rotation, nerve and blood vessel injury, DVT, even the possibility anesthetic complications.  His wife agrees to proceed with surgery and consent was obtained.  Roby Lofts, MD [facility] [phone] (office) [email or web address]

## 2023-09-26 NOTE — Progress Notes (Signed)
PT Cancellation Note  Patient Details Name: Ernest Campbell MRN: [id number] DOB: 09-03-1950   Cancelled Treatment:    Reason Eval/Treat Not Completed: Patient's level of consciousness (Pt in deep sleep, unable to alert to verbal or tactile including sternal rub or pressure to nail bed. Will follow up at later date/time as pt able and schedule allows.)   Renaldo Fiddler PT, DPT Acute Rehabilitation Services Office [phone]  09/26/23 3:11 PM

## 2023-09-26 NOTE — Anesthesia Postprocedure Evaluation (Signed)
Anesthesia Post Note  Patient: Ernest Campbell  Procedure(s) Performed: INTRAMEDULLARY (IM) NAIL INTERTROCHANTERIC (Right: Leg Upper)     Patient location during evaluation: PACU Anesthesia Type: General Level of consciousness: sedated and patient cooperative Pain management: pain level controlled Vital Signs Assessment: post-procedure vital signs reviewed and stable Respiratory status: spontaneous breathing Cardiovascular status: stable Anesthetic complications: no   No notable events documented.  Last Vitals:  Vitals:   09/26/23 1100 09/26/23 1133  BP: (!) 104/57 (!) 103/58  Pulse: 88 86  Resp: 12 14  Temp: 36.7 C 36.7 C  SpO2: 93% 93%    Last Pain:  Vitals:   09/26/23 1133  TempSrc: Axillary  PainSc:                  Lewie Loron

## 2023-09-26 NOTE — Progress Notes (Signed)
Pt awake and can tell me is name but not date of birth. His speech is not clear and is more mumbled. Pt is pulling at IV. Mittens applied, bed in lowest position and personal items within reach. PO meds taken with applesauce and small sips of water.

## 2023-09-27 ENCOUNTER — Encounter (HOSPITAL_COMMUNITY): Payer: Self-pay | Admitting: Student

## 2023-09-27 DIAGNOSIS — S72001A Fracture of unspecified part of neck of right femur, initial encounter for closed fracture: Secondary | ICD-10-CM | POA: Diagnosis not present

## 2023-09-27 LAB — COMPREHENSIVE METABOLIC PANEL
ALT: 12 U/L (ref 0–44)
AST: 23 U/L (ref 15–41)
Albumin: 2.4 g/dL — ABNORMAL LOW (ref 3.5–5.0)
Alkaline Phosphatase: 68 U/L (ref 38–126)
Anion gap: 12 (ref 5–15)
BUN: 14 mg/dL (ref 8–23)
CO2: 22 mmol/L (ref 22–32)
Calcium: 8.6 mg/dL — ABNORMAL LOW (ref 8.9–10.3)
Chloride: 101 mmol/L (ref 98–111)
Creatinine, Ser: 1.04 mg/dL (ref 0.61–1.24)
GFR, Estimated: >60 mL/min (ref >60)
Glucose, Bld: 98 mg/dL (ref 70–99)
Potassium: 4.1 mmol/L (ref 3.5–5.1)
Sodium: 135 mmol/L (ref 135–145)
Total Bilirubin: 1.2 mg/dL (ref 0.0–1.2)
Total Protein: 5.1 g/dL — ABNORMAL LOW (ref 6.5–8.1)

## 2023-09-27 MED ORDER — VITAMIN D 25 MCG (1000 UNIT) PO TABS
1000.0000 [IU] | ORAL_TABLET | Freq: Every day | ORAL | Status: DC
  Administered 2023-09-27 – 2023-09-30 (×4): 1000 [IU] via ORAL
  Filled 2023-09-27 (×4): qty 1

## 2023-09-27 NOTE — Evaluation (Signed)
Physical Therapy Evaluation Patient Details Name: Ernest Campbell MRN: [id number] DOB: [birth year] Today's Date: 09/27/2023  History of Present Illness  Patient is 73 y.o. male presented 09/25/23 from SNF due to fall resulting in Rt hip pain. Pt found to have intertrochanteric fx and s/p Rt hip ORIF for cephalomedullary nailing on 09/26/23. Pt recently admitted to [facility] from 2/7-2/8 for intermittent bouts of agitation, and family unable to care for the patient. PMH includes recent CVA, TBI, dementia, and parkinsonism.  Clinical Impression  PTA, pt had been residing at short term SNF, was needing min assist to supervision for ADL's and ambulating with no AD or using wheelchair propelled with his feet. On PT evaluation, pt presents with decreased cognition, sitting balance, weakness, and R hip pain. Pt requiring two person maximal assist for bed mobility and two person moderate assist for transfers. Utilized Stedy to transition from bed to chair due to pt decreased ability to weight shift. Patient will benefit from continued inpatient follow up therapy, <3 hours/day in order to address deficits, maximize functional mobility and decrease caregiver burden.        If plan is discharge home, recommend the following: Two people to help with walking and/or transfers;Two people to help with bathing/dressing/bathroom   Can travel by private vehicle   No    Equipment Recommendations Other (comment) (defer to SNF)  Recommendations for Other Services       Functional Status Assessment Patient has had a recent decline in their functional status and demonstrates the ability to make significant improvements in function in a reasonable and predictable amount of time.     Precautions / Restrictions Precautions Precautions: Fall Recall of Precautions/Restrictions: Impaired Restrictions Weight Bearing Restrictions Per Provider Order: Yes RLE Weight Bearing Per Provider Order: Weight bearing as tolerated       Mobility  Bed Mobility Overal bed mobility: Needs Assistance Bed Mobility: Supine to Sit     Supine to sit: Max assist, +2 for physical assistance     General bed mobility comments: pt requires cueing for sequencing and technique, requires max assist +2 using pad to pivot to EOB; pain in R hip    Transfers Overall transfer level: Needs assistance Equipment used: Rolling walker (2 wheels), Ambulation equipment used Transfers: Sit to/from Stand, Bed to chair/wheelchair/BSC Sit to Stand: Mod assist, +2 physical assistance           General transfer comment: stood with RW and stedy at EOB, pt pulling on both; mod assist to power up and increased time to come fully upright.  Steady to pivot to recliner. Initally has posterior lean Transfer via Lift Equipment: Stedy  Ambulation/Gait                  Stairs            Wheelchair Mobility     Tilt Bed    Modified Rankin (Stroke Patients Only)       Balance Overall balance assessment: Needs assistance Sitting-balance support: No upper extremity supported, Feet supported, Single extremity supported, Bilateral upper extremity supported Sitting balance-Leahy Scale: Poor Sitting balance - Comments: posterior lean   Standing balance support: Bilateral upper extremity supported, During functional activity Standing balance-Leahy Scale: Poor Standing balance comment: relies on BUe and external support                             Pertinent Vitals/Pain Pain Assessment Pain Assessment: Faces Faces Pain  Scale: Hurts even more Pain Location: R hip Pain Descriptors / Indicators: Discomfort, Operative site guarding, Grimacing Pain Intervention(s): Limited activity within patient's tolerance, Monitored during session, Premedicated before session (with Tylenol)    Home Living Family/patient expects to be discharged to:: Skilled nursing facility                   Additional Comments: had been at  home but most recently at [facility] for 20 days    Prior Function Prior Level of Function : Needs assist             Mobility Comments: pt was ambulating without AD at times (spouse unsure if he was supposed to), transfers without assist to commode, manuvering w/c with legs ADLs Comments: pt was most recently managing ADLs with min assist to supervision, toileting with supervision.  Uses pull ups but very few accidents.     Extremity/Trunk Assessment   Upper Extremity Assessment Upper Extremity Assessment: Generalized weakness    Lower Extremity Assessment Lower Extremity Assessment: Generalized weakness    Cervical / Trunk Assessment Cervical / Trunk Assessment: Kyphotic  Communication   Communication Communication: Impaired Factors Affecting Communication: Reduced clarity of speech    Cognition Arousal: Alert Behavior During Therapy: Flat affect, Anxious   PT - Cognitive impairments: Orientation, Memory, Awareness, Attention, Initiation, Sequencing, Problem solving, Safety/Judgement   Orientation impairments: Place, Situation, Time                   PT - Cognition Comments: A&Ox1, mumbles and difficul to understand, follows commands with hand over hand guidance Following commands: Impaired Following commands impaired: Follows one step commands inconsistently, Follows one step commands with increased time     Cueing Cueing Techniques: Verbal cues, Tactile cues, Visual cues     General Comments General comments (skin integrity, edema, etc.): spouse at side and supportive    Exercises     Assessment/Plan    PT Assessment Patient needs continued PT services  PT Problem List Decreased strength;Decreased range of motion;Decreased activity tolerance;Decreased balance;Decreased mobility;Decreased cognition;Decreased safety awareness;Pain       PT Treatment Interventions Gait training;DME instruction;Functional mobility training;Therapeutic  activities;Therapeutic exercise;Balance training;Cognitive remediation;Patient/family education    PT Goals (Current goals can be found in the Care Plan section)  Acute Rehab PT Goals Patient Stated Goal: pt spouse would like him to go back to rehab PT Goal Formulation: With patient/family Time For Goal Achievement: 10/11/23 Potential to Achieve Goals: Fair    Frequency Min 1X/week     Co-evaluation PT/OT/SLP Co-Evaluation/Treatment: Yes Reason for Co-Treatment: For patient/therapist safety;To address functional/ADL transfers PT goals addressed during session: Mobility/safety with mobility         AM-PAC PT "6 Clicks" Mobility  Outcome Measure Help needed turning from your back to your side while in a flat bed without using bedrails?: A Lot Help needed moving from lying on your back to sitting on the side of a flat bed without using bedrails?: Total Help needed moving to and from a bed to a chair (including a wheelchair)?: Total Help needed standing up from a chair using your arms (e.g., wheelchair or bedside chair)?: Total Help needed to walk in hospital room?: Total Help needed climbing 3-5 steps with a railing? : Total 6 Click Score: 7    End of Session Equipment Utilized During Treatment: Gait belt Activity Tolerance: Patient tolerated treatment well Patient left: with call bell/phone within reach;in bed;in chair;with chair alarm set Nurse Communication: Mobility status PT  Visit Diagnosis: Other abnormalities of gait and mobility (R26.89);Muscle weakness (generalized) (M62.81);History of falling (Z91.81);Pain Pain - Right/Left: Right Pain - part of body: Hip    Time: 0847-0920 PT Time Calculation (min) (ACUTE ONLY): 33 min   Charges:   PT Evaluation $PT Eval Low Complexity: 1 Low   PT General Charges $$ ACUTE PT VISIT: 1 Visit         Lillia Pauls, PT, DPT Acute Rehabilitation Services Office [phone]   Norval Morton 09/27/2023, 10:08 AM

## 2023-09-27 NOTE — Evaluation (Signed)
Occupational Therapy Evaluation Patient Details Name: Ernest Campbell MRN: [id number] DOB: 03-21-1951 Today's Date: 09/27/2023   History of Present Illness   Patient is 73 y.o. male presented 09/25/23 from SNF due to fall resulting in Rt hip pain. Pt found to have intertrochanteric fx and s/p Rt hip ORIF for cephalomedullary nailing on 09/26/23. Pt recently admitted to [facility] from 2/7-2/8 for intermittent bouts of agitation, and family unable to care for the patient. PMH includes recent CVA, TBI, dementia, and parkinsonism.     Clinical Impressions Pt admitted for above and limited by problem list below.  PTA patient was needing min assist to supervision with ADLs, ambulating with no AD with supervision or using w/c propelling with his feet. Pt typically lives with spouse, but has been staying at rehab for the last 20 days receiving therapy services. Today, patient requires max assist +2 with bed mobility, min to total assist +2 with ADLs, and mod assist +2 with transfers using stedy to pivot to recliner.  Cognitively, requires cueing for attention, problem solving and safety; known hx of dementia and pt only oriented to self.  Based on performance today, believe patient will best benefit from continued OT services acutely and after dc at inpatient setting with <3hrs/day to optimize independence, safety with ADLs and mobility.       If plan is discharge home, recommend the following:   Two people to help with walking and/or transfers;Two people to help with bathing/dressing/bathroom;Direct supervision/assist for medications management;Direct supervision/assist for financial management;Supervision due to cognitive status;Assist for transportation;Assistance with cooking/housework     Functional Status Assessment   Patient has had a recent decline in their functional status and demonstrates the ability to make significant improvements in function in a reasonable and predictable amount of time.      Equipment Recommendations   Other (comment) (defer to next venue)     Recommendations for Other Services         Precautions/Restrictions   Precautions Precautions: Fall Recall of Precautions/Restrictions: Impaired Restrictions Weight Bearing Restrictions Per Provider Order: Yes RLE Weight Bearing Per Provider Order: Weight bearing as tolerated     Mobility Bed Mobility Overal bed mobility: Needs Assistance Bed Mobility: Supine to Sit     Supine to sit: Max assist, +2 for physical assistance     General bed mobility comments: pt requires cueing for sequencing and technique, requires max assist +2 using pad to pivot to EOB; pain in R hip    Transfers Overall transfer level: Needs assistance Equipment used: Rolling walker (2 wheels), Ambulation equipment used Transfers: Sit to/from Stand, Bed to chair/wheelchair/BSC Sit to Stand: Mod assist, +2 physical assistance           General transfer comment: stood with RW and stedy at EOB, pt pulling on both; mod assist to power up and increased time to come fully upright.  Steady to pivot to recliner. Initally has posterior lean Transfer via Lift Equipment: Stedy    Balance Overall balance assessment: Needs assistance Sitting-balance support: No upper extremity supported, Feet supported, Single extremity supported, Bilateral upper extremity supported Sitting balance-Leahy Scale: Poor Sitting balance - Comments: posterior lean   Standing balance support: Bilateral upper extremity supported, During functional activity Standing balance-Leahy Scale: Poor Standing balance comment: relies on BUe and external support                           ADL either performed or assessed with clinical judgement  ADL Overall ADL's : Needs assistance/impaired     Grooming: Minimal assistance;Sitting;Wash/dry face Grooming Details (indicate cue type and reason): min assist to maintain balance initally with 0 hand  support but improved to min guard             Lower Body Dressing: Total assistance;+2 for physical assistance;Sit to/from stand   Toilet Transfer: Total assistance;+2 for physical assistance Toilet Transfer Details (indicate cue type and reason): to recliner, using stedy Toileting- Clothing Manipulation and Hygiene: Total assistance;+2 for physical assistance;Sit to/from stand       Functional mobility during ADLs: Maximal assistance;Moderate assistance;+2 for physical assistance (stedy)       Vision   Vision Assessment?: No apparent visual deficits Additional Comments: not formally assessed     Perception         Praxis         Pertinent Vitals/Pain Pain Assessment Pain Assessment: Faces Faces Pain Scale: Hurts even more Pain Location: R hip Pain Descriptors / Indicators: Discomfort, Operative site guarding, Grimacing Pain Intervention(s): Limited activity within patient's tolerance, Monitored during session, Premedicated before session, Repositioned     Extremity/Trunk Assessment Upper Extremity Assessment Upper Extremity Assessment: Generalized weakness   Lower Extremity Assessment Lower Extremity Assessment: Defer to PT evaluation   Cervical / Trunk Assessment Cervical / Trunk Assessment: Kyphotic   Communication Communication Communication: Impaired Factors Affecting Communication: Reduced clarity of speech   Cognition Arousal: Alert Behavior During Therapy: Flat affect, Anxious Cognition: History of cognitive impairments             OT - Cognition Comments: hx of dementia, oriented to self. Follow some simple commands with increased time but requires cueing for safety, and probelm solving.                 Following commands: Impaired Following commands impaired: Follows one step commands inconsistently, Follows one step commands with increased time     Cueing  General Comments   Cueing Techniques: Verbal cues;Tactile cues;Visual  cues      Exercises     Shoulder Instructions      Home Living Family/patient expects to be discharged to:: Skilled nursing facility                                 Additional Comments: had been at home but most recently at [facility] for 20 days      Prior Functioning/Environment Prior Level of Function : Needs assist             Mobility Comments: pt was ambulating without AD at times (spouse unsure if he was supposed to), transfers without assist to commode, manuvering w/c with legs ADLs Comments: pt was most recently managing ADLs with min assist to supervision, toileting with supervision.  Uses pull ups but very few accidents.    OT Problem List: Decreased strength;Decreased activity tolerance;Impaired balance (sitting and/or standing);Pain;Decreased safety awareness;Decreased knowledge of use of DME or AE;Decreased knowledge of precautions;Decreased cognition   OT Treatment/Interventions: Self-care/ADL training;Therapeutic exercise;DME and/or AE instruction;Therapeutic activities;Balance training;Patient/family education      OT Goals(Current goals can be found in the care plan section)   Acute Rehab OT Goals Patient Stated Goal: rehab OT Goal Formulation: With family Time For Goal Achievement: 10/11/23 Potential to Achieve Goals: Good   OT Frequency:  Min 1X/week    Co-evaluation              AM-PAC  OT "6 Clicks" Daily Activity     Outcome Measure Help from another person eating meals?: A Little Help from another person taking care of personal grooming?: A Little Help from another person toileting, which includes using toliet, bedpan, or urinal?: Total Help from another person bathing (including washing, rinsing, drying)?: A Lot Help from another person to put on and taking off regular upper body clothing?: A Lot Help from another person to put on and taking off regular lower body clothing?: Total 6 Click Score: 12   End of Session  Equipment Utilized During Treatment: Gait belt;Rolling walker (2 wheels) Nurse Communication: Mobility status;Need for lift equipment  Activity Tolerance: Patient tolerated treatment well Patient left: in chair;with call bell/phone within reach;with chair alarm set;with family/visitor present  OT Visit Diagnosis: Other abnormalities of gait and mobility (R26.89);Muscle weakness (generalized) (M62.81);Pain;Other symptoms and signs involving cognitive function;History of falling (Z91.81) Pain - Right/Left: Right Pain - part of body: Hip                Time: 6045-4098 OT Time Calculation (min): 31 min Charges:  OT General Charges $OT Visit: 1 Visit OT Evaluation $OT Eval Moderate Complexity: 1 Mod  Barry Brunner, OT Acute Rehabilitation Services Office [phone] Secure Chat Preferred    Chancy Milroy 09/27/2023, 9:54 AM

## 2023-09-27 NOTE — Plan of Care (Signed)

## 2023-09-27 NOTE — Progress Notes (Signed)
  Progress Note   Patient: Ernest Campbell ZOX:[id number] DOB: 05/24/1951 DOA: 09/25/2023     2 DOS: the patient was seen and examined on 09/27/2023   Brief hospital course: 73 y.o. male with medical history significant of CVA, dementia, parkinsonism, traumatic brain injury who was brought from his facility due to having a fall in the morning and no LOC, but the patient was unable to get up on his own due to right hip pain.  He is unable to provide any more history at the moment.   Assessment and Plan: Closed right hip fracture, initial encounter [facility]) -Orthopedic Surgery consulted. Pt underwent nailing of R intertrochanteric femur fracture 2/20 -Continue analgesia  -therapy recs for SNF. F/u with TOC   Active Problems:   Other secondary parkinsonism (HCC) Continue Sinemet IR 25-100 mg p.o. 3 times daily. Follow-up with neurology as an outpatient.     TBI (traumatic brain injury) [facility])   CVA (cerebral vascular accident) (HCC) Supportive care. Reorient as needed. Continue memantine 5 mg p.o. twice daily.     Macrocytic anemia Monitor hematocrit hemoglobin.     Hypocalcemia Corrected calcium, accounting for hypoalbuminemia, is 9.58  Hypoalbuminemia      Subjective: Pleasantly confused this AM. Pt's family reports pt ate breakfast well this AM  Physical Exam: Vitals:   09/27/23 0000 09/27/23 0600 09/27/23 0800 09/27/23 1244  BP: 110/68 120/78 (!) 132/47 114/64  Pulse: 79 81 87 87  Resp: 17 17 17 17   Temp: 99.1 F (37.3 C) 98.9 F (37.2 C) 98.2 F (36.8 C) 98.2 F (36.8 C)  TempSrc: Oral Oral Oral Oral  SpO2: 94% 94% 99% 99%  Weight:      Height:       General exam: Conversant, in no acute distress Respiratory system: normal chest rise, clear, no audible wheezing Cardiovascular system: regular rhythm, s1-s2 Gastrointestinal system: Nondistended, nontender, pos BS Central nervous system: No seizures, no tremors Extremities: No cyanosis, no joint deformities Skin: No  rashes, no pallor Psychiatry:difficult to assess given mentation  Data Reviewed:  Labs reviewed: Na 135, K 4.1, Cr 1.04  Family Communication: Pt in room, family not at bedside  Disposition: Status is: Inpatient Remains inpatient appropriate because: severity of illness  Planned Discharge Destination: Skilled nursing facility    Author: Rickey Barbara, MD 09/27/2023 4:09 PM  For on call review [email or web address].

## 2023-09-27 NOTE — Care Management Important Message (Signed)
Important Message  Patient Details  Name: Ernest Campbell MRN: [id number] Date of Birth: [birth year]   Important Message Given:  Yes - Medicare IM     Sherilyn Banker 09/27/2023, 3:21 PM

## 2023-09-27 NOTE — Progress Notes (Signed)
Orthopaedic Trauma Progress Note  SUBJECTIVE: Doing okay this morning.  Resting comfortably in bed.  States pain in right hip is manageable.  Dementia at baseline, unable to obtain much history.  No chest pain. No SOB. No nausea/vomiting. No other complaints.  Patient's wife at bedside  OBJECTIVE:  Vitals:   09/27/23 0000 09/27/23 0600  BP: 110/68 120/78  Pulse: 79 81  Resp: 17 17  Temp: 99.1 F (37.3 C) 98.9 F (37.2 C)  SpO2: 94% 94%    General: Sitting up in bed, no acute distress Respiratory: No increased work of breathing.  Right lower extremity: Dressings clean, dry, intact.  Tenderness over the hip and throughout the thigh as expected.  Nontender throughout the calf.  Tolerates gentle ankle range of motion.  Able to wiggle the toes.  Foot warm and well-perfused.  2+ DP pulse  IMAGING: Stable post op imaging.   LABS:  Results for orders placed or performed during the hospital encounter of 09/25/23 (from the past 24 hours)  VITAMIN D 25 Hydroxy (Vit-D Deficiency, Fractures)     Status: Abnormal   Collection Time: 09/26/23  1:07 PM  Result Value Ref Range   Vit D, 25-Hydroxy 27.33 (L) 30 - 100 ng/mL  Comprehensive metabolic panel     Status: Abnormal   Collection Time: 09/27/23  5:14 AM  Result Value Ref Range   Sodium 135 135 - 145 mmol/L   Potassium 4.1 3.5 - 5.1 mmol/L   Chloride 101 98 - 111 mmol/L   CO2 22 22 - 32 mmol/L   Glucose, Bld 98 70 - 99 mg/dL   BUN 14 8 - 23 mg/dL   Creatinine, Ser 6.29 0.61 - 1.24 mg/dL   Calcium 8.6 (L) 8.9 - 10.3 mg/dL   Total Protein 5.1 (L) 6.5 - 8.1 g/dL   Albumin 2.4 (L) 3.5 - 5.0 g/dL   AST 23 15 - 41 U/L   ALT 12 0 - 44 U/L   Alkaline Phosphatase 68 38 - 126 U/L   Total Bilirubin 1.2 0.0 - 1.2 mg/dL   GFR, Estimated >52 >84 mL/min   Anion gap 12 5 - 15    ASSESSMENT: Ernest Campbell is a 73 y.o. male, 1 Day Post-Op s/p INTRAMEDULLARY (IM) NAIL RIGHT INTERTROCHANTERIC FEMUR FRACTURE  CV/Blood loss: Acute blood loss  anemia, Hgb 10.4 this morning. Hemodynamically stable  PLAN: Weightbearing: WBAT RLE ROM: Okay for hip/knee ROM as tolerated Incisional and dressing care: Reinforce dressings as needed  Showering: Okay to begin showering and getting incisions wet 09/29/2023 Orthopedic device(s): None  Pain management:  1. Tylenol 650 mg q 6 hours PRN 2. Robaxin 500 mg q 6 hours PRN  VTE prophylaxis: Aspirin, SCDs ID:  Ancef 2gm post op Foley/Lines:  No foley, KVO IVFs Impediments to Fracture Healing: Vitamin D level 17, started on supplementation Dispo: PT/OT evaluation today, plan to return to SNF at discharge.  Okay for discharge from ortho standpoint once cleared by medicine team and therapies  D/C recommendations: -Tylenol and Robaxin for pain control -Aspirin 325 mg daily x 30 days for DVT prophylaxis -Continue 1000 units Vit D supplementation daily x 90 days  Follow - up plan: 2 weeks after discharge for wound check and repeat x-rays   Contact information:  Truitt Merle MD, Thyra Breed PA-C. After hours and holidays please check [email or web address] for group call information for [facility]   Thompson Caul, PA-C [phone] (office) [email or web address]

## 2023-09-28 DIAGNOSIS — S72001A Fracture of unspecified part of neck of right femur, initial encounter for closed fracture: Secondary | ICD-10-CM | POA: Diagnosis not present

## 2023-09-28 LAB — CBC
HCT: 29.9 % — ABNORMAL LOW (ref 39.0–52.0)
Hemoglobin: 10.2 g/dL — ABNORMAL LOW (ref 13.0–17.0)
MCH: 32.5 pg (ref 26.0–34.0)
MCHC: 34.1 g/dL (ref 30.0–36.0)
MCV: 95.2 fL (ref 80.0–100.0)
Platelets: 263 10*3/uL (ref 150–400)
RBC: 3.14 MIL/uL — ABNORMAL LOW (ref 4.22–5.81)
RDW: 13.3 % (ref 11.5–15.5)
WBC: 8.2 10*3/uL (ref 4.0–10.5)
nRBC: 0 % (ref 0.0–0.2)

## 2023-09-28 LAB — COMPREHENSIVE METABOLIC PANEL
ALT: 23 U/L (ref 0–44)
AST: 22 U/L (ref 15–41)
Albumin: 2.3 g/dL — ABNORMAL LOW (ref 3.5–5.0)
Alkaline Phosphatase: 69 U/L (ref 38–126)
Anion gap: 11 (ref 5–15)
BUN: 19 mg/dL (ref 8–23)
CO2: 23 mmol/L (ref 22–32)
Calcium: 8.7 mg/dL — ABNORMAL LOW (ref 8.9–10.3)
Chloride: 103 mmol/L (ref 98–111)
Creatinine, Ser: 0.84 mg/dL (ref 0.61–1.24)
GFR, Estimated: >60 mL/min (ref >60)
Glucose, Bld: 112 mg/dL — ABNORMAL HIGH (ref 70–99)
Potassium: 3.9 mmol/L (ref 3.5–5.1)
Sodium: 137 mmol/L (ref 135–145)
Total Bilirubin: 0.7 mg/dL (ref 0.0–1.2)
Total Protein: 5 g/dL — ABNORMAL LOW (ref 6.5–8.1)

## 2023-09-28 MED ORDER — FLUTICASONE PROPIONATE 50 MCG/ACT NA SUSP
1.0000 | Freq: Every day | NASAL | Status: DC
  Administered 2023-09-28 – 2023-09-30 (×3): 1 via NASAL
  Filled 2023-09-28: qty 16

## 2023-09-28 MED ORDER — ACETAMINOPHEN 325 MG PO TABS
650.0000 mg | ORAL_TABLET | Freq: Four times a day (QID) | ORAL | 0 refills | Status: DC | PRN
Start: 2023-09-28 — End: 2023-10-24

## 2023-09-28 MED ORDER — ASPIRIN 325 MG PO TABS: 325.0000 mg | ORAL_TABLET | Freq: Every day | ORAL | 0 refills | Status: AC

## 2023-09-28 MED ORDER — QUETIAPINE FUMARATE 25 MG PO TABS
25.0000 mg | ORAL_TABLET | Freq: Every day | ORAL | Status: DC
  Administered 2023-09-28: 25 mg via ORAL
  Filled 2023-09-28: qty 1

## 2023-09-28 NOTE — Plan of Care (Signed)

## 2023-09-28 NOTE — [place] FL2 (Signed)
 [place] MEDICAID FL2 LEVEL OF CARE FORM     IDENTIFICATION  Patient Name: Ernest Campbell Birthdate: [birth year] Sex: male Admission Date (Current Location): 09/25/2023  [facility] and [place] Number:  Producer, television/film/video and Address:  [facility] [place], [address] [place], [place] [address]      Provider Number: [id number]  Attending Physician Name and Address:  Jerald Kief, MD  Relative Name and Phone Number:       Current Level of Care: Hospital Recommended Level of Care: Skilled Nursing Facility Prior Approval Number:    Date Approved/Denied:   PASRR Number: [id number]  Discharge Plan: SNF    Current Diagnoses: Patient Active Problem List   Diagnosis Date Noted   Closed right hip fracture, initial encounter (HCC) 09/25/2023   Cervical spondylosis 09/25/2023   Paraparesis (HCC) 09/25/2023   Macrocytic anemia 09/25/2023   Hypocalcemia 09/25/2023   Altered mental status 07/21/2023   CVA (cerebral vascular accident) (HCC) 07/18/2023   Shuffling gait 07/03/2021   Episodes of formed visual hallucinations 07/03/2021   Nocturnal enuresis 07/03/2021   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   TBI (traumatic brain injury) (HCC) 07/03/2021   History of short term memory loss 07/03/2021    Orientation RESPIRATION BLADDER Height & Weight     Self, Place  Normal Incontinent, External catheter Weight: 147 lb 11.3 oz (67 kg) Height:  5\' 6"  (167.6 cm)  BEHAVIORAL SYMPTOMS/MOOD NEUROLOGICAL BOWEL NUTRITION STATUS      Continent Diet (See dc summary)  AMBULATORY STATUS COMMUNICATION OF NEEDS Skin   Extensive Assist Verbally PU Stage and Appropriate Care (Stage I on sacrum; closed incision on leg)                       Personal Care Assistance Level of Assistance  Bathing, Feeding, Dressing Bathing Assistance: Maximum assistance Feeding assistance: Maximum assistance Dressing Assistance:  Maximum assistance     Functional Limitations Info             SPECIAL CARE FACTORS FREQUENCY  PT (By licensed PT), OT (By licensed OT)     PT Frequency: 5x/week OT Frequency: 5x/week            Contractures Contractures Info: Not present    Additional Factors Info  Code Status, Allergies Code Status Info: Full Allergies Info: Fentanyl And Related, Other, Prednisone, Acetaminophen, Codeine, Morphine, Oxycodone           Current Medications (09/28/2023):  This is the current hospital active medication list Current Facility-Administered Medications  Medication Dose Route Frequency Provider Last Rate Last Admin   0.9 %  sodium chloride infusion   Intravenous Continuous [place] Bali, PA-C 125 mL/hr at 09/25/23 2343 New Bag at 09/25/23 2343   acetaminophen (TYLENOL) tablet 650 mg  650 mg Oral Q6H PRN [place] Bali, PA-C   650 mg at 09/28/23 2355   aspirin tablet 325 mg  325 mg Oral Daily [place] Bali, PA-C   325 mg at 09/28/23 0919   carbidopa-levodopa (SINEMET IR) 25-100 MG per tablet immediate release 1 tablet  1 tablet Oral TID WC West Bali, PA-C   1 tablet at 09/28/23 7322   cholecalciferol (VITAMIN D3) 25 MCG (1000 UNIT) tablet 1,000 Units  1,000 Units Oral Daily [place] Bali, PA-C   1,000 Units at 09/28/23 0919   docusate sodium (COLACE) capsule 100 mg  100 mg Oral  BID [place] Bali, PA-C   100 mg at 09/28/23 1027   feeding supplement (ENSURE ENLIVE / ENSURE PLUS) liquid 237 mL  237 mL Oral BID BM Jerald Kief, MD   237 mL at 09/28/23 0920   memantine (NAMENDA) tablet 5 mg  5 mg Oral BID West Bali, PA-C   5 mg at 09/28/23 2536   methocarbamol (ROBAXIN) tablet 500 mg  500 mg Oral Q6H PRN West Bali, PA-C       Or   methocarbamol (ROBAXIN) injection 500 mg  500 mg Intravenous Q6H PRN West Bali, PA-C       metoCLOPramide (REGLAN) tablet 5-10 mg  5-10 mg Oral Q8H PRN Sharon Seller, Sarah A, PA-C       Or   metoCLOPramide (REGLAN)  injection 5-10 mg  5-10 mg Intravenous Q8H PRN McClung, Sarah A, PA-C       mupirocin ointment (BACTROBAN) 2 % 1 Application  1 Application Nasal BID West Bali, PA-C   1 Application at 09/28/23 6440   ondansetron (ZOFRAN) tablet 4 mg  4 mg Oral Q6H PRN West Bali, PA-C       Or   ondansetron (ZOFRAN) injection 4 mg  4 mg Intravenous Q6H PRN McClung, Sarah A, PA-C       polyethylene glycol (MIRALAX / GLYCOLAX) packet 17 g  17 g Oral Daily PRN West Bali, PA-C         Discharge Medications: Please see discharge summary for a list of discharge medications.  Relevant Imaging Results:  Relevant Lab Results:   Additional Information SSN: [id number]  Mearl Latin, LCSW

## 2023-09-28 NOTE — TOC Initial Note (Addendum)
 Transition of Care [facility]) - Initial/Assessment Note    Patient Details  Name: Ernest Campbell MRN: [id number] Date of Birth: 1950/09/27  [facility] CM/SW Contact:    Mearl Latin, LCSW Phone Number: 09/28/2023, 10:52 AM  Clinical Narrative:                 10:52am-Patient admitted from [facility] where he was sent on 2/8 for rehab. CSW left voicemail for patient's spouse and will continue to follow.  11:09 AM-Spouse returned call and confirmed plan to return to [facility]. CSW answered questions about insurance. Patient will require PTAR for transport.   CSW updated [facility] on potential for weekend discharge.    Expected Discharge Plan: Skilled Nursing Facility Barriers to Discharge: Continued Medical Work up   Patient Goals and CMS Choice            Expected Discharge Plan and Services In-house Referral: Clinical Social Work     Living arrangements for the past 2 months: Skilled Nursing Facility, Single Family Home                                      Prior Living Arrangements/Services Living arrangements for the past 2 months: Skilled Nursing Facility, Single Family Home Lives with:: Spouse Patient language and need for interpreter reviewed:: Yes Do you feel safe going back to the place where you live?: Yes      Need for Family Participation in Patient Care: Yes (Comment) Care giver support system in place?: Yes (comment)   Criminal Activity/Legal Involvement Pertinent to Current Situation/Hospitalization: No - Comment as needed  Activities of Daily Living      Permission Sought/Granted Permission sought to share information with : Facility Medical sales representative, Family Supports Permission granted to share information with : No  Share Information with NAME: [name]  Permission granted to share info w AGENCY: [facility]  Permission granted to share info w Relationship: Spouse  Permission granted to share info w Contact  Information: [phone]  Emotional Assessment Appearance:: Appears stated age Attitude/Demeanor/Rapport: Unable to Assess Affect (typically observed): Unable to Assess Orientation: : Oriented to Self, Oriented to Place Alcohol / Substance Use: Not Applicable Psych Involvement: No (comment)  Admission diagnosis:  Closed fracture of right hip, initial encounter (HCC) [S72.001A] Closed right hip fracture, initial encounter [facility]) [S72.001A] Patient Active Problem List   Diagnosis Date Noted   Closed right hip fracture, initial encounter (HCC) 09/25/2023   Cervical spondylosis 09/25/2023   Paraparesis (HCC) 09/25/2023   Macrocytic anemia 09/25/2023   Hypocalcemia 09/25/2023   Altered mental status 07/21/2023   CVA (cerebral vascular accident) (HCC) 07/18/2023   Shuffling gait 07/03/2021   Episodes of formed visual hallucinations 07/03/2021   Nocturnal enuresis 07/03/2021   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   TBI (traumatic brain injury) (HCC) 07/03/2021   History of short term memory loss 07/03/2021   PCP:  Margot Ables, MD (Inactive) Pharmacy:   [facility] [id number] - [place], [place] - [address] [place] [address] Phone: [phone] Fax: [phone]     Social Drivers of Health (SDOH) Social History: SDOH Screenings   Food Insecurity: No Food Insecurity (09/25/2023)  Housing: Low Risk  (09/25/2023)  Transportation Needs: No Transportation Needs (09/25/2023)  Utilities: Not At Risk (09/25/2023)  Financial Resource Strain: Low Risk  (10/20/2022)   [facility] from [facility], [facility]  Physical Activity: Unknown (10/20/2022)   Received from [facility], [facility]  Social Connections: Moderately Isolated (09/25/2023)  Stress: Patient Declined (10/20/2022)   Received from [facility], [facility]  Tobacco Use: High Risk (09/25/2023)   SDOH Interventions:      Readmission Risk Interventions     No data to display

## 2023-09-28 NOTE — Plan of Care (Signed)

## 2023-09-28 NOTE — Progress Notes (Signed)
  Progress Note   Patient: Ernest Campbell:[id number] DOB: 1951-05-17 DOA: 09/25/2023     3 DOS: the patient was seen and examined on 09/28/2023   Brief hospital course: 73 y.o. male with medical history significant of CVA, dementia, parkinsonism, traumatic brain injury who was brought from his facility due to having a fall in the morning and no LOC, but the patient was unable to get up on his own due to right hip pain.  He is unable to provide any more history at the moment.   Assessment and Plan: Closed right hip fracture, initial encounter [facility]) -Orthopedic Surgery consulted. Pt underwent nailing of R intertrochanteric femur fracture 2/20 -Continue analgesia  -therapy recs for SNF. F/u with TOC recs    Active Problems:   Other secondary parkinsonism (HCC) Continue Sinemet IR 25-100 mg p.o. 3 times daily. Follow-up with neurology as an outpatient. Wife reports subpar sleep habits overnight -Will give trial of seroquel at bedtime. EKG reviewed. QTc appears unremarkable     TBI (traumatic brain injury) (HCC)   CVA (cerebral vascular accident) (HCC) Supportive care. Reorient as needed. Continue memantine 5 mg p.o. twice daily.     Macrocytic anemia Monitor hematocrit hemoglobin.     Hypocalcemia Corrected calcium, accounting for hypoalbuminemia, is 9.58  Hypoalbuminemia      Subjective: Pleasantly confused this AM  Physical Exam: Vitals:   09/27/23 2051 09/28/23 0457 09/28/23 0813 09/28/23 1635  BP: (!) 113/52 118/81 103/66 (!) 143/87  Pulse: 85 87 97 87  Resp: 16 16 15    Temp: 98.2 F (36.8 C) 97.9 F (36.6 C) 98.3 F (36.8 C) 97.7 F (36.5 C)  TempSrc:  Oral Oral Oral  SpO2: 100% 97% 98% 100%  Weight:      Height:       General exam: Awake, laying in bed, in nad Respiratory system: Normal respiratory effort, no wheezing Cardiovascular system: regular rate, s1, s2 Gastrointestinal system: Soft, nondistended, positive BS Central nervous system: CN2-12  grossly intact, strength intact Extremities: Perfused, no clubbing Skin: Normal skin turgor, no notable skin lesions seen Psychiatry: Mood normal // no visual hallucinations   Data Reviewed:  Labs reviewed: Na 137, K 3.9, Cr 0.84  Family Communication: Pt in room, family not at bedside  Disposition: Status is: Inpatient Remains inpatient appropriate because: severity of illness  Planned Discharge Destination: Skilled nursing facility    Author: Rickey Barbara, MD 09/28/2023 4:48 PM  For on call review [email or web address].

## 2023-09-28 NOTE — Discharge Instructions (Signed)
 Orthopaedic Trauma Service Discharge Instructions   General Discharge Instructions  Orthopaedic Injuries:  Right hip fracture s/p intramedullary nailing   WEIGHT BEARING STATUS: Weight-bear as tolerated right leg with assistance  RANGE OF MOTION/ACTIVITY: Range of motion as tolerated right hip and knee  Bone health: Vitamin D3 supplementation for vitamin D deficiency  Review the following resource for additional information regarding bone health  [email or web address]  Wound Care: Daily wound care as needed.  Change dressings as needed.  Okay to leave open to air once there is no drainage  DVT/PE prophylaxis: Aspirin 325 mg daily for 30 days and then transition back to 81 mg daily  Diet: as you were eating previously.  Can use over the counter stool softeners and bowel preparations, such as Miralax, to help with bowel movements.  Narcotics can be constipating.  Be sure to drink plenty of fluids  PAIN MEDICATION USE AND EXPECTATIONS  You have likely been given narcotic medications to help control your pain.  After a traumatic event that results in an fracture (broken bone) with or without surgery, it is ok to use narcotic pain medications to help control one's pain.  We understand that everyone responds to pain differently and each individual patient will be evaluated on a regular basis for the continued need for narcotic medications. Ideally, narcotic medication use should last no more than 6-8 weeks (coinciding with fracture healing).   As a patient it is your responsibility as well to monitor narcotic medication use and report the amount and frequency you use these medications when you come to your office visit.   We would also advise that if you are using narcotic medications, you should take a dose prior to therapy to maximize you participation.  IF YOU ARE ON NARCOTIC MEDICATIONS IT IS NOT PERMISSIBLE TO OPERATE A MOTOR VEHICLE (MOTORCYCLE/CAR/TRUCK/MOPED)  OR HEAVY MACHINERY DO NOT MIX NARCOTICS WITH OTHER CNS (CENTRAL NERVOUS SYSTEM) DEPRESSANTS SUCH AS ALCOHOL   POST-OPERATIVE OPIOID TAPER INSTRUCTIONS: It is important to wean off of your opioid medication as soon as possible. If you do not need pain medication after your surgery it is ok to stop day one. Opioids include: Codeine, Hydrocodone(Norco, Vicodin), Oxycodone(Percocet, oxycontin) and hydromorphone amongst others.  Long term and even short term use of opiods can cause: Increased pain response Dependence Constipation Depression Respiratory depression And more.  Withdrawal symptoms can include Flu like symptoms Nausea, vomiting And more Techniques to manage these symptoms Hydrate well Eat regular healthy meals Stay active Use relaxation techniques(deep breathing, meditating, yoga) Do Not substitute Alcohol to help with tapering If you have been on opioids for less than two weeks and do not have pain than it is ok to stop all together.  Plan to wean off of opioids This plan should start within one week post op of your fracture surgery  Maintain the same interval or time between taking each dose and first decrease the dose.  Cut the total daily intake of opioids by one tablet each day Next start to increase the time between doses. The last dose that should be eliminated is the evening dose.    STOP SMOKING OR USING NICOTINE PRODUCTS!!!!  As discussed nicotine severely impairs your body's ability to heal surgical and traumatic wounds but also impairs bone healing.  Wounds and bone heal by forming microscopic blood vessels (angiogenesis) and nicotine is a vasoconstrictor (essentially, shrinks blood vessels).  Therefore, if vasoconstriction occurs to these microscopic blood vessels they essentially disappear and are unable  to deliver necessary nutrients to the healing tissue.  This is one modifiable factor that you can do to dramatically increase your chances of healing your  injury.    (This means no smoking, no nicotine gum, patches, etc)  DO NOT USE NONSTEROIDAL ANTI-INFLAMMATORY DRUGS (NSAID'S)  Using products such as Advil (ibuprofen), Aleve (naproxen), Motrin (ibuprofen) for additional pain control during fracture healing can delay and/or prevent the healing response.  If you would like to take over the counter (OTC) medication, Tylenol (acetaminophen) is ok.  However, some narcotic medications that are given for pain control contain acetaminophen as well. Therefore, you should not exceed more than 4000 mg of tylenol in a day if you do not have liver disease.  Also note that there are may OTC medicines, such as cold medicines and allergy medicines that my contain tylenol as well.  If you have any questions about medications and/or interactions please ask your doctor/PA or your pharmacist.      ICE AND ELEVATE INJURED/OPERATIVE EXTREMITY  Using ice and elevating the injured extremity above your heart can help with swelling and pain control.  Icing in a pulsatile fashion, such as 20 minutes on and 20 minutes off, can be followed.    Do not place ice directly on skin. Make sure there is a barrier between to skin and the ice pack.    Using frozen items such as frozen peas works well as the conform nicely to the are that needs to be iced.  USE AN ACE WRAP OR TED HOSE FOR SWELLING CONTROL  In addition to icing and elevation, Ace wraps or TED hose are used to help limit and resolve swelling.  It is recommended to use Ace wraps or TED hose until you are informed to stop.    When using Ace Wraps start the wrapping distally (farthest away from the body) and wrap proximally (closer to the body)   Example: If you had surgery on your leg and you do not have a splint on, start the ace wrap at the toes and work your way up to the thigh        If you had surgery on your upper extremity and do not have a splint on, start the ace wrap at your fingers and work your way up to the upper  arm  IF YOU ARE IN A SPLINT OR CAST DO NOT REMOVE IT FOR ANY REASON   If your splint gets wet for any reason please contact the office immediately. You may shower in your splint or cast as long as you keep it dry.  This can be done by wrapping in a cast cover or garbage back (or similar)  Do Not stick any thing down your splint or cast such as pencils, money, or hangers to try and scratch yourself with.  If you feel itchy take benadryl as prescribed on the bottle for itching  IF YOU ARE IN A CAM BOOT (BLACK BOOT)  You may remove boot periodically. Perform daily dressing changes as noted below.  Wash the liner of the boot regularly and wear a sock when wearing the boot. It is recommended that you sleep in the boot until told otherwise    Call office for the following: Temperature greater than 101F Persistent nausea and vomiting Severe uncontrolled pain Redness, tenderness, or signs of infection (pain, swelling, redness, odor or green/yellow discharge around the site) Difficulty breathing, headache or visual disturbances Hives Persistent dizziness or light-headedness Extreme fatigue Any other  questions or concerns you may have after discharge  In an emergency, call 911 or go to an Emergency Department at a nearby hospital  HELPFUL INFORMATION  If you had a block, it will wear off between 8-24 hrs postop typically.  This is period when your pain may go from nearly zero to the pain you would have had postop without the block.  This is an abrupt transition but nothing dangerous is happening.  You may take an extra dose of narcotic when this happens.  You should wean off your narcotic medicines as soon as you are able.  Most patients will be off or using minimal narcotics before their first postop appointment.   We suggest you use the pain medication the first night prior to going to bed, in order to ease any pain when the anesthesia wears off. You should avoid taking pain medications on an  empty stomach as it will make you nauseous.  Do not drink alcoholic beverages or take illicit drugs when taking pain medications.  In most states it is against the law to drive while you are in a splint or sling.  And certainly against the law to drive while taking narcotics.  You may return to work/school in the next couple of days when you feel up to it.   Pain medication may make you constipated.  Below are a few solutions to try in this order: Decrease the amount of pain medication if you aren't having pain. Drink lots of decaffeinated fluids. Drink prune juice and/or each dried prunes  If the first 3 don't work start with additional solutions Take Colace - an over-the-counter stool softener Take Senokot - an over-the-counter laxative Take Miralax - a stronger over-the-counter laxative     CALL THE OFFICE WITH ANY QUESTIONS OR CONCERNS: [phone]   VISIT OUR WEBSITE FOR ADDITIONAL INFORMATION: [email or web address]      Discharge Pin Site Instructions  Dress pins daily with Kerlix roll starting on POD 2. Wrap the Kerlix so that it tamps the skin down around the pin-skin interface to prevent/limit motion of the skin relative to the pin.  (Pin-skin motion is the primary cause of pain and infection related to external fixator pin sites).  Remove any crust or coagulum that may obstruct drainage with soap and water.  After POD 3, if there is no discernable drainage on the pin site dressing, the interval for change can by increased to every other day.  You may shower with the fixator, cleaning all pin sites gently with soap and water.  If you have a surgical wound this needs to be completely dry and without drainage before showering. Alternatively you can use a washcloth with soap and water and gently clean the injured extremity and external fixator, including all pinsites and surgical wounds   The extremity can be lifted by the fixator to facilitate wound care and  transfers.  Notify the office/Doctor if you experience increasing drainage, redness, or pain from a pin site, or if you notice purulent (thick, snot-like) drainage. As we discussed pin tract infections are common in this is most likely as a result of mechanical irritation from the skin pin interface. Primary treatment is hygiene and cleaning with soap and water. If this does not resolve with regular cleaning contact the office  Discharge Wound Care Instructions  Do NOT apply any ointments, solutions or lotions to pin sites or surgical wounds.  These prevent needed drainage and even though solutions like hydrogen peroxide kill bacteria, they  also damage cells lining the pin sites that help fight infection.  Applying lotions or ointments can keep the wounds moist and can cause them to breakdown and open up as well. This can increase the risk for infection. When in doubt call the office.  Surgical incisions should be dressed daily.  If any drainage is noted, use one layer of adaptic or Mepitel, then gauze, Kerlix, and an ace wrap.  [email or web address]  [email or web address]  These dressing supplies should be available at local medical supply stores ([facility] [employer], [facility], etc). They are not usually carried at places like [facility], [facility], [employer], etc  Once the incision is completely dry and without drainage, it may be left open to air out.  Showering may begin 36-48 hours later.  Cleaning gently with soap and water.  Traumatic wounds should be dressed daily as well.    One layer of adaptic, gauze, Kerlix, then ace wrap.  The adaptic can be discontinued once the draining has ceased    If you have a wet to dry dressing: wet the gauze with saline the squeeze as  much saline out so the gauze is moist (not soaking wet), place moistened gauze over wound, then place a dry gauze over the moist one, followed by Kerlix wrap, then ace wrap.

## 2023-09-28 NOTE — Progress Notes (Signed)
 Orthopaedic Trauma Service Progress Note  Patient ID: Ernest Campbell MRN: [id number] DOB/AGE: 71-28-1952 71 y.o.  Subjective:  Comfortable appearing  Wife at bedside Mittens in place   Dementia at baseline    ROS  Objective:   VITALS:   Vitals:   09/27/23 1244 09/27/23 2051 09/28/23 0457 09/28/23 0813  BP: 114/64 (!) 113/52 118/81 103/66  Pulse: 87 85 87 97  Resp: 17 16 16 15   Temp: 98.2 F (36.8 C) 98.2 F (36.8 C) 97.9 F (36.6 C) 98.3 F (36.8 C)  TempSrc: Oral  Oral Oral  SpO2: 99% 100% 97% 98%  Weight:      Height:        Estimated body mass index is 23.84 kg/m as calculated from the following:   Height as of this encounter: 5\' 6"  (1.676 m).   Weight as of this encounter: 67 kg.   Intake/Output      02/21 0701 02/22 0700 02/22 0701 02/23 0700   I.V. (mL/kg)     IV Piggyback     Total Intake(mL/kg)     Blood     Total Output     Net            LABS  Results for orders placed or performed during the hospital encounter of 09/25/23 (from the past 24 hours)  Comprehensive metabolic panel     Status: Abnormal   Collection Time: 09/28/23  4:43 AM  Result Value Ref Range   Sodium 137 135 - 145 mmol/L   Potassium 3.9 3.5 - 5.1 mmol/L   Chloride 103 98 - 111 mmol/L   CO2 23 22 - 32 mmol/L   Glucose, Bld 112 (H) 70 - 99 mg/dL   BUN 19 8 - 23 mg/dL   Creatinine, Ser 4.09 0.61 - 1.24 mg/dL   Calcium 8.7 (L) 8.9 - 10.3 mg/dL   Total Protein 5.0 (L) 6.5 - 8.1 g/dL   Albumin 2.3 (L) 3.5 - 5.0 g/dL   AST 22 15 - 41 U/L   ALT 23 0 - 44 U/L   Alkaline Phosphatase 69 38 - 126 U/L   Total Bilirubin 0.7 0.0 - 1.2 mg/dL   GFR, Estimated >81 >19 mL/min   Anion gap 11 5 - 15  CBC     Status: Abnormal   Collection Time: 09/28/23  4:43 AM  Result Value Ref Range   WBC 8.2 4.0 - 10.5 K/uL   RBC 3.14 (L) 4.22 - 5.81 MIL/uL   Hemoglobin 10.2 (L) 13.0 - 17.0 g/dL   HCT 14.7 (L) 82.9 -  52.0 %   MCV 95.2 80.0 - 100.0 fL   MCH 32.5 26.0 - 34.0 pg   MCHC 34.1 30.0 - 36.0 g/dL   RDW 56.2 13.0 - 86.5 %   Platelets 263 150 - 400 K/uL   nRBC 0.0 0.0 - 0.2 %     PHYSICAL EXAM:   Gen: awake, sitting up in bed, NAD but not participatory in exam  Lungs: unlabored Cardiac: s1 and s2 Ext:       Right Lower Extremity   Dressings c/d/I  Ext warm   + DP pulse  No DCT  Good perfusion distally    Assessment/Plan: 2 Days Post-Op   Principal Problem:   Closed right hip fracture, initial encounter (  HCC) Active Problems:   Other secondary parkinsonism (HCC)   TBI (traumatic brain injury) (HCC)   CVA (cerebral vascular accident) (HCC)   Macrocytic anemia   Hypocalcemia   Anti-infectives (From admission, onward)    Start     Dose/Rate Route Frequency Ordered Stop   09/26/23 1600  ceFAZolin (ANCEF) IVPB 2g/100 mL premix        2 g 200 mL/hr over 30 Minutes Intravenous Every 8 hours 09/26/23 1131 09/27/23 0556   09/26/23 0815  ceFAZolin (ANCEF) IVPB 2g/100 mL premix        2 g 200 mL/hr over 30 Minutes Intravenous On call to O.R. 09/26/23 0810 09/26/23 1016   09/26/23 0807  ceFAZolin (ANCEF) 2-4 GM/100ML-% IVPB       Note to Pharmacy: Patsi Sears E: cabinet override      09/26/23 0807 09/26/23 0947     .  POD/HD#: 2  73 y/o male s/p fall with R hip fracture s/p IMN   Weightbearing: WBAT RLE ROM: Okay for hip/knee ROM as tolerated Incisional and dressing care: Reinforce dressings as needed, ok to change dressings. Use mepilex or 4x4s and tape   Showering: Okay to begin showering and getting incisions wet 09/29/2023 Orthopedic device(s): None  Pain management:  1. Tylenol 650 mg q 6 hours PRN 2. Robaxin 500 mg q 6 hours PRN   VTE prophylaxis: Aspirin, SCDs ID:  Ancef 2gm post op completed  Foley/Lines:  No foley, KVO IVFs Impediments to Fracture Healing: Vitamin D level 17, started on supplementation  Dispo: PT/OT evaluation today, plan to return to  SNF at discharge.  Okay for discharge from ortho standpoint once cleared by medicine team and therapies   D/C recommendations: -Tylenol and Robaxin for pain control -Aspirin 325 mg daily x 30 days for DVT prophylaxis -Continue 1000 units Vit D supplementation daily x 90 days   Follow - up plan: 2 weeks after discharge for wound check and repeat x-rays  Mearl Latin, PA-C [phone] (C) 09/28/2023, 11:38 AM  [facility] [address] [place] [address] [phone] [phone] (F)    After 5pm and on the weekends please log on to Amion, go to orthopaedics and the look under the [facility] Call for the provider(s) on call. You can also call our office at [phone] and then follow the prompts to be connected to the call team.  Patient ID: Ernest Campbell, male   DOB: [birth year], 73 y.o.   MRN: [id number]

## 2023-09-29 DIAGNOSIS — S72001A Fracture of unspecified part of neck of right femur, initial encounter for closed fracture: Secondary | ICD-10-CM | POA: Diagnosis not present

## 2023-09-29 DIAGNOSIS — E43 Unspecified severe protein-calorie malnutrition: Secondary | ICD-10-CM | POA: Insufficient documentation

## 2023-09-29 LAB — COMPREHENSIVE METABOLIC PANEL
ALT: 24 U/L (ref 0–44)
AST: 26 U/L (ref 15–41)
Albumin: 2.5 g/dL — ABNORMAL LOW (ref 3.5–5.0)
Alkaline Phosphatase: 72 U/L (ref 38–126)
Anion gap: 12 (ref 5–15)
BUN: 17 mg/dL (ref 8–23)
CO2: 22 mmol/L (ref 22–32)
Calcium: 8.6 mg/dL — ABNORMAL LOW (ref 8.9–10.3)
Chloride: 101 mmol/L (ref 98–111)
Creatinine, Ser: 0.8 mg/dL (ref 0.61–1.24)
GFR, Estimated: >60 mL/min (ref >60)
Glucose, Bld: 101 mg/dL — ABNORMAL HIGH (ref 70–99)
Potassium: 4 mmol/L (ref 3.5–5.1)
Sodium: 135 mmol/L (ref 135–145)
Total Bilirubin: 0.9 mg/dL (ref 0.0–1.2)
Total Protein: 5.5 g/dL — ABNORMAL LOW (ref 6.5–8.1)

## 2023-09-29 LAB — CBC
HCT: 32.8 % — ABNORMAL LOW (ref 39.0–52.0)
Hemoglobin: 11 g/dL — ABNORMAL LOW (ref 13.0–17.0)
MCH: 32.6 pg (ref 26.0–34.0)
MCHC: 33.5 g/dL (ref 30.0–36.0)
MCV: 97.3 fL (ref 80.0–100.0)
Platelets: 308 10*3/uL (ref 150–400)
RBC: 3.37 MIL/uL — ABNORMAL LOW (ref 4.22–5.81)
RDW: 13.2 % (ref 11.5–15.5)
WBC: 6.7 10*3/uL (ref 4.0–10.5)
nRBC: 0 % (ref 0.0–0.2)

## 2023-09-29 MED ORDER — QUETIAPINE FUMARATE 25 MG PO TABS
25.0000 mg | ORAL_TABLET | Freq: Every day | ORAL | Status: DC
  Administered 2023-09-29: 25 mg via ORAL
  Filled 2023-09-29: qty 1

## 2023-09-29 NOTE — Progress Notes (Signed)
  Progress Note   Patient: Ernest Campbell WUJ:[id number] DOB: 03-10-1951 DOA: 09/25/2023     4 DOS: the patient was seen and examined on 09/29/2023   Brief hospital course: 73 y.o. male with medical history significant of CVA, dementia, parkinsonism, traumatic brain injury who was brought from his facility due to having a fall in the morning and no LOC, but the patient was unable to get up on his own due to right hip pain.  He is unable to provide any more history at the moment.   Assessment and Plan: Closed right hip fracture, initial encounter [facility]) -Orthopedic Surgery consulted. Pt underwent nailing of R intertrochanteric femur fracture 2/20 -Continue analgesia  -therapy recs for SNF. F/u with TOC recs. Family asked about CIR here. Will consult CIR to see if pt is appropriate for CIR   Active Problems:   Other secondary parkinsonism (HCC) Continue Sinemet IR 25-100 mg p.o. 3 times daily. Follow-up with neurology as an outpatient. -Very good results with trial of low dose seroquel at night. Will continue     TBI (traumatic brain injury) [facility])   CVA (cerebral vascular accident) (HCC) Supportive care. Reorient as needed. Continue memantine 5 mg p.o. twice daily.     Macrocytic anemia Monitor hematocrit hemoglobin.     Hypocalcemia Corrected calcium, accounting for hypoalbuminemia, is 9.58  Hypoalbuminemia      Subjective: Without complaints this AM  Physical Exam: Vitals:   09/28/23 1635 09/28/23 2207 09/29/23 0534 09/29/23 0813  BP: (!) 143/87 (!) 130/48 (!) 115/57 (!) 139/59  Pulse: 87 83 88 85  Resp:  16  18  Temp: 97.7 F (36.5 C) 98.8 F (37.1 C) 98.8 F (37.1 C) 98.9 F (37.2 C)  TempSrc: Oral Oral Oral Oral  SpO2: 100% 99% 93%   Weight:      Height:       General exam: Conversant, in no acute distress Respiratory system: normal chest rise, clear, no audible wheezing Cardiovascular system: regular rhythm, s1-s2 Gastrointestinal system: Nondistended,  nontender, pos BS Central nervous system: No seizures, no tremors Extremities: No cyanosis, no joint deformities Skin: No rashes, no pallor Psychiatry: Affect normal // no auditory hallucinations   Data Reviewed:  Labs reviewed: Na 135, K 4.0, Cr 0.80  Family Communication: Pt in room, family at bedside  Disposition: Status is: Inpatient Remains inpatient appropriate because: severity of illness  Planned Discharge Destination: Skilled nursing facility    Author: Rickey Barbara, MD 09/29/2023 2:34 PM  For on call review [email or web address].

## 2023-09-29 NOTE — Plan of Care (Signed)

## 2023-09-29 NOTE — Progress Notes (Signed)
     Subjective:  Patient reports pain as mild.  Sitting in chair.  Wife at bedside.  Objective:   VITALS:   Vitals:   09/28/23 1635 09/28/23 2207 09/29/23 0534 09/29/23 0813  BP: (!) 143/87 (!) 130/48 (!) 115/57 (!) 139/59  Pulse: 87 83 88 85  Resp:  16  18  Temp: 97.7 F (36.5 C) 98.8 F (37.1 C) 98.8 F (37.1 C) 98.9 F (37.2 C)  TempSrc: Oral Oral Oral Oral  SpO2: 100% 99% 93%   Weight:      Height:        Neurologically intact Dorsiflexion/Plantar flexion intact Incision: dressing C/D/I and no drainage   Lab Results  Component Value Date   WBC 6.7 09/29/2023   HGB 11.0 (L) 09/29/2023   HCT 32.8 (L) 09/29/2023   MCV 97.3 09/29/2023   PLT 308 09/29/2023   BMET    Component Value Date/Time   NA 135 09/29/2023 0611   NA 138 07/03/2021 1539   K 4.0 09/29/2023 0611   CL 101 09/29/2023 0611   CO2 22 09/29/2023 0611   GLUCOSE 101 (H) 09/29/2023 0611   BUN 17 09/29/2023 0611   BUN 19 07/03/2021 1539   CREATININE 0.80 09/29/2023 0611   CALCIUM 8.6 (L) 09/29/2023 0611   EGFR 73 07/03/2021 1539   GFRNONAA >60 09/29/2023 4098     Assessment/Plan: 3 Days Post-Op   Principal Problem:   Closed right hip fracture, initial encounter (HCC) Active Problems:   Other secondary parkinsonism (HCC)   TBI (traumatic brain injury) (HCC)   CVA (cerebral vascular accident) (HCC)   Macrocytic anemia   Hypocalcemia   Protein-calorie malnutrition, severe   Weightbearing: WBAT RLE ROM: Okay for hip/knee ROM as tolerated Incisional and dressing care: Reinforce dressings as needed, ok to change dressings. Use mepilex or 4x4s and tape   Showering: Okay to begin showering and getting incisions wet 09/29/2023 Orthopedic device(s): None  Pain management:  1. Tylenol 650 mg q 6 hours PRN 2. Robaxin 500 mg q 6 hours PRN   VTE prophylaxis: Aspirin, SCDs ID:  Ancef 2gm post op completed  Foley/Lines:  No foley, KVO IVFs Impediments to Fracture Healing: Vitamin D level 17,  started on supplementation   Dispo: PT/OT evaluation today, plan to return to SNF at discharge.  Okay for discharge from ortho standpoint once cleared by medicine team and therapies   D/C recommendations: -Tylenol and Robaxin for pain control -Aspirin 325 mg daily x 30 days for DVT prophylaxis -Continue 1000 units Vit D supplementation daily x 90 days   Follow - up plan: 2 weeks after discharge for wound check and repeat x-rays   Eulas Post 09/29/2023, 10:19 AM   Teryl Lucy, MD Cell [phone]

## 2023-09-30 ENCOUNTER — Other Ambulatory Visit: Payer: Medicare Other | Admitting: *Deleted

## 2023-09-30 ENCOUNTER — Encounter: Payer: Self-pay | Admitting: *Deleted

## 2023-09-30 DIAGNOSIS — S72001A Fracture of unspecified part of neck of right femur, initial encounter for closed fracture: Secondary | ICD-10-CM | POA: Diagnosis not present

## 2023-09-30 LAB — COMPREHENSIVE METABOLIC PANEL
ALT: 26 U/L (ref 0–44)
AST: 26 U/L (ref 15–41)
Albumin: 2.5 g/dL — ABNORMAL LOW (ref 3.5–5.0)
Alkaline Phosphatase: 79 U/L (ref 38–126)
Anion gap: 5 (ref 5–15)
BUN: 21 mg/dL (ref 8–23)
CO2: 28 mmol/L (ref 22–32)
Calcium: 8.6 mg/dL — ABNORMAL LOW (ref 8.9–10.3)
Chloride: 104 mmol/L (ref 98–111)
Creatinine, Ser: 0.88 mg/dL (ref 0.61–1.24)
GFR, Estimated: >60 mL/min (ref >60)
Glucose, Bld: 103 mg/dL — ABNORMAL HIGH (ref 70–99)
Potassium: 4.3 mmol/L (ref 3.5–5.1)
Sodium: 137 mmol/L (ref 135–145)
Total Bilirubin: 0.7 mg/dL (ref 0.0–1.2)
Total Protein: 5.6 g/dL — ABNORMAL LOW (ref 6.5–8.1)

## 2023-09-30 LAB — CBC
HCT: 33.8 % — ABNORMAL LOW (ref 39.0–52.0)
Hemoglobin: 11.3 g/dL — ABNORMAL LOW (ref 13.0–17.0)
MCH: 32.7 pg (ref 26.0–34.0)
MCHC: 33.4 g/dL (ref 30.0–36.0)
MCV: 97.7 fL (ref 80.0–100.0)
Platelets: 337 10*3/uL (ref 150–400)
RBC: 3.46 MIL/uL — ABNORMAL LOW (ref 4.22–5.81)
RDW: 13.2 % (ref 11.5–15.5)
WBC: 7.8 10*3/uL (ref 4.0–10.5)
nRBC: 0 % (ref 0.0–0.2)

## 2023-09-30 MED ORDER — QUETIAPINE FUMARATE 25 MG PO TABS: 25.0000 mg | ORAL_TABLET | Freq: Every day | ORAL | 0 refills | Status: DC

## 2023-09-30 MED ORDER — FLUTICASONE PROPIONATE 50 MCG/ACT NA SUSP: 1.0000 | Freq: Every day | NASAL | Status: DC

## 2023-09-30 MED ORDER — POLYETHYLENE GLYCOL 3350 17 G PO PACK: 17.0000 g | PACK | Freq: Every day | ORAL | Status: DC | PRN

## 2023-09-30 NOTE — TOC Transition Note (Signed)
 Transition of Care [facility]) - Discharge Note   Patient Details  Name: Ernest Campbell MRN: [id number] Date of Birth: 14-Oct-1950  [facility] CM/SW Contact:  Lorri Frederick, LCSW Phone Number: 09/30/2023, 12:13 PM   Clinical Narrative:   Pt discharging to [facility], room 203.  RN call report to [phone].   [phone]: CSW confirmed with [facility] that they can receive pt today.  Medicare payer with inpt order on 09/25/23.    Final next level of care: Skilled Nursing Facility Barriers to Discharge: Barriers Resolved   Patient Goals and CMS Choice            Discharge Placement              Patient chooses bed at: [facility] Patient to be transferred to facility by: ptar Name of family member notified: wife robbin Patient and family notified of of transfer: 09/30/23  Discharge Plan and Services Additional resources added to the After Visit Summary for   In-house Referral: Clinical Social Work                                   Social Drivers of Health (SDOH) Interventions SDOH Screenings   Food Insecurity: No Food Insecurity (09/25/2023)  Housing: Low Risk  (09/25/2023)  Transportation Needs: No Transportation Needs (09/25/2023)  Utilities: Not At Risk (09/25/2023)  Financial Resource Strain: Low Risk  (10/20/2022)   Received from [facility], [facility]  Physical Activity: Unknown (10/20/2022)   Received from [facility], [facility]  Social Connections: Moderately Isolated (09/25/2023)  Stress: Patient Declined (10/20/2022)   Received from [facility], [facility]  Tobacco Use: High Risk (09/25/2023)     Readmission Risk Interventions     No data to display

## 2023-09-30 NOTE — Progress Notes (Signed)
 Physical Therapy Treatment Patient Details Name: Ernest Campbell MRN: [id number] DOB: [birth year] Today's Date: 09/30/2023   History of Present Illness Patient is 73 y.o. male presented 09/25/23 from SNF due to fall resulting in Rt hip pain. Pt found to have intertrochanteric fx and s/p Rt hip ORIF for cephalomedullary nailing on 09/26/23. Pt recently admitted to [facility] from 2/7-2/8 for intermittent bouts of agitation, and family unable to care for the patient. PMH includes recent CVA, TBI, dementia, and parkinsonism.    PT Comments  Pt was able to be one person assist to stand and transfer to the chair today and tolerated R LE exercises well with minimal outward signs of pain.  His wife and niece were in the room.  He is due to d/c to SNF for rehab later today.  PT will continue to follow acutely for safe mobility progression.   If plan is discharge home, recommend the following: Two people to help with walking and/or transfers;Two people to help with bathing/dressing/bathroom   Can travel by private vehicle     No  Equipment Recommendations  Hospital bed;Wheelchair cushion (measurements PT);Wheelchair (measurements PT);Hoyer lift    Recommendations for [employer]       Precautions / Restrictions Precautions Precautions: Fall Recall of Precautions/Restrictions: Impaired Precaution/Restrictions Comments: retropulsive Restrictions RLE [employer] Bearing Per Provider Order: Weight bearing as tolerated     Mobility  Bed Mobility Overal bed mobility: Needs Assistance Bed Mobility: Supine to Sit Rolling: Mod assist         General bed mobility comments: Pt refused to lay back down at the end of the session, so I ended up transferring him to the recliner chair instead.    Transfers Overall transfer level: Needs assistance Equipment used: Rolling walker (2 wheels), Ambulation equipment used Transfers: Sit to/from Stand, Bed to chair/wheelchair/BSC Sit to Stand: Mod assist Stand  pivot transfers: Mod assist         General transfer comment: stood multiple times EOB both with and without RW.  Attempted side steps, however, unable and or unagreeable to side step.    Ambulation/Gait                   Stairs             Wheelchair Mobility     Tilt Bed    Modified Rankin (Stroke Patients Only)       Balance Overall balance assessment: Needs assistance Sitting-balance support: Feet supported, No upper extremity supported Sitting balance-Leahy Scale: Good Sitting balance - Comments: supervision EOB once positioned well       Standing balance comment: relies on BUe and external support, posterior lean in standing.                            Communication Communication Communication: Impaired Factors Affecting Communication: Reduced clarity of speech  Cognition Arousal: Alert Behavior During Therapy: Agitated (increasingly more agitated by the end of the session)   PT - Cognitive impairments: Orientation, Memory, Awareness, Attention, Initiation, Sequencing, Problem solving, Safety/Judgement   Orientation impairments: Place, Situation, Time                   PT - Cognition Comments: A&Ox1, mumbles and difficul to understand, follows commands with hand over hand guidance.  Cracking jokes today with wife and niece in room. Following commands: Impaired Following commands impaired: Follows one step commands inconsistently, Follows one step commands with increased time  Cueing Cueing Techniques: Verbal cues, Tactile cues, Visual cues  Exercises Total Joint Exercises Ankle Circles/Pumps: AAROM, Both, 10 reps Heel Slides: AAROM, Right, 10 reps Hip ABduction/ADduction: AAROM, Right, 10 reps    General Comments        Pertinent Vitals/Pain Pain Assessment Pain Assessment: Faces Faces Pain Scale: Hurts even more Pain Location: R hip Pain Descriptors / Indicators: Discomfort, Operative site guarding,  Grimacing Pain Intervention(s): Limited activity within patient's tolerance, Monitored during session, Repositioned, Patient requesting pain meds-RN notified    Home Living                          Prior Function            PT Goals (current goals can now be found in the care plan section) Acute Rehab PT Goals Patient Stated Goal: pt spouse would like him to go back to rehab Progress towards PT goals: Progressing toward goals    Frequency    Min 1X/week      PT Plan      Co-evaluation              AM-PAC PT "6 Clicks" Mobility   Outcome Measure  Help needed turning from your back to your side while in a flat bed without using bedrails?: A Lot Help needed moving from lying on your back to sitting on the side of a flat bed without using bedrails?: A Lot Help needed moving to and from a bed to a chair (including a wheelchair)?: A Lot Help needed standing up from a chair using your arms (e.g., wheelchair or bedside chair)?: Total Help needed to walk in hospital room?: Total Help needed climbing 3-5 steps with a railing? : Total 6 Click Score: 9    End of Session Equipment Utilized During Treatment: Gait belt Activity Tolerance: Patient limited by pain Patient left: in chair;with call bell/phone within reach;with chair alarm set;with family/visitor present Nurse Communication: Mobility status;Patient requests pain meds PT Visit Diagnosis: Other abnormalities of gait and mobility (R26.89);Muscle weakness (generalized) (M62.81);History of falling (Z91.81);Pain Pain - Right/Left: Right Pain - part of body: Hip     Time: 1202-1250 PT Time Calculation (min) (ACUTE ONLY): 48 min  Charges:    $Therapeutic Exercise: 8-22 mins $Therapeutic Activity: 23-37 mins PT General Charges $$ ACUTE PT VISIT: 1 Visit                     Corinna Capra, PT, DPT  Acute Rehabilitation Secure chat is best for contact #[phone] office    09/30/2023, 3:44 PM

## 2023-09-30 NOTE — Progress Notes (Signed)
 DISCHARGE NOTE SNF Ernest Campbell to be discharged Rehab per MD order. Patient verbalized understanding.  Skin clean, dry and intact without evidence of skin break down, no evidence of skin tears noted. IV catheter discontinued intact. Site without signs and symptoms of complications. Dressing and pressure applied. Pt denies pain at the site currently. No complaints noted.  Patient free of lines, drains, and wounds.   Discharge packet assembled. An After Visit Summary (AVS) was printed and given to the EMS personnel. Patient escorted via stretcher and discharged to [facility] via ambulance. Report called to accepting facility; all questions and concerns addressed.   [name], RNDAllyson Acquanetta Chain, RN

## 2023-09-30 NOTE — Progress Notes (Signed)
 Inpatient Rehab Admissions Coordinator:  Consult received. Therapy recommending SNF. It does not appear as if pt would be able to tolerate the intensity of CIR. TOC has arranged SNF placement for pt. Pt is scheduled to admit to SNF today. AC will sign off.   Wolfgang Phoenix, MS, CCC-SLP Admissions Coordinator [phone]

## 2023-09-30 NOTE — Progress Notes (Signed)
 Attempted to call report x2 to [facility] for patient returning to facility. PTAR made aware

## 2023-09-30 NOTE — Plan of Care (Addendum)
 Pt is alert to self. Vitals stable. Denies pain. Wife at bedside. Condom cath in place due to incontinence changed this am. Mitts replaced due to pt pulling condom cath off. Refuses scd. On aspirin daily  Problem: Clinical Measurements: Goal: Ability to maintain clinical measurements within normal limits will improve Outcome: Progressing Goal: Will remain free from infection Outcome: Progressing Goal: Diagnostic test results will improve Outcome: Progressing Goal: Respiratory complications will improve Outcome: Progressing Goal: Cardiovascular complication will be avoided Outcome: Progressing   Problem: Coping: Goal: Level of anxiety will decrease Outcome: Progressing   Problem: Elimination: Goal: Will not experience complications related to urinary retention Outcome: Progressing   Problem: Pain Managment: Goal: General experience of comfort will improve and/or be controlled Outcome: Progressing   Problem: Safety: Goal: Ability to remain free from injury will improve Outcome: Progressing   Problem: Skin Integrity: Goal: Risk for impaired skin integrity will decrease Outcome: Progressing

## 2023-09-30 NOTE — Discharge Summary (Signed)
 Physician Discharge Summary   Patient: Ernest Campbell MRN: [id number] DOB: [birth year]  Admit date:     09/25/2023  Discharge date: 09/30/23  Discharge Physician: Rickey Barbara   PCP: Margot Ables, MD (Inactive)   Recommendations at discharge:    Follow up with PCP in 1-2 weeks Follow up with Orthopedic Surgery in 2 weeks  Discharge Diagnoses: Principal Problem:   Closed right hip fracture, initial encounter [facility]) Active Problems:   Other secondary parkinsonism (HCC)   TBI (traumatic brain injury) (HCC)   CVA (cerebral vascular accident) (HCC)   Macrocytic anemia   Hypocalcemia   Protein-calorie malnutrition, severe  Resolved Problems:   * No resolved hospital problems. *  Hospital Course: 73 y.o. male with medical history significant of CVA, dementia, parkinsonism, traumatic brain injury who was brought from his facility due to having a fall in the morning and no LOC, but the patient was unable to get up on his own due to right hip pain.  He is unable to provide any more history at the moment.   Assessment and Plan: Closed right hip fracture, initial encounter [facility]) -Orthopedic Surgery consulted. Pt underwent nailing of R intertrochanteric femur fracture 2/20 -Continue analgesia  -therapy recs for SNF.    Active Problems:   Other secondary parkinsonism (HCC) Continue Sinemet IR 25-100 mg p.o. 3 times daily. Follow-up with neurology as an outpatient. -Very good results with trial of low dose seroquel at night. Will continue     TBI (traumatic brain injury) [facility])   CVA (cerebral vascular accident) (HCC) Supportive care. Reorient as needed. Continue memantine 5 mg p.o. twice daily.     Macrocytic anemia Monitor hematocrit hemoglobin.     Hypocalcemia Corrected calcium, accounting for hypoalbuminemia, is 9.58   Hypoalbuminemia       Consultants: Orthopedic Surgery Procedures performed: nailing of R intertrochanteric femur fracture 2/20    Disposition: Skilled nursing facility Diet recommendation:  Regular diet DISCHARGE MEDICATION: Allergies as of 09/30/2023       Reactions   Fentanyl And Related Anaphylaxis   Other Other (See Comments)   According to the patient, all opioids cause hallucinations   Prednisone Other (See Comments)   Hallucinations   Acetaminophen    Hallucinations   Codeine Other (See Comments)   Hallucinations   Morphine Other (See Comments)   Hallucinations   Oxycodone Other (See Comments)   Hallucinations        Medication List     PAUSE taking these medications    aspirin EC 81 MG tablet Wait to take this until: October 30, 2023 Take 1 tablet (81 mg total) by mouth daily. Swallow whole. You also have another medication with the same name that you may need to continue taking.       STOP taking these medications    haloperidol 10 MG tablet Commonly known as: HALDOL   ibuprofen 200 MG tablet Commonly known as: ADVIL       TAKE these medications    acetaminophen 325 MG tablet Commonly known as: TYLENOL Take 2 tablets (650 mg total) by mouth every 6 (six) hours as needed for mild pain (pain score 1-3) or moderate pain (pain score 4-6).   aspirin 325 MG tablet Take 1 tablet (325 mg total) by mouth daily. What changed: Another medication with the same name was paused. Ask your nurse or doctor if you should take this medication.   carbidopa-levodopa 25-100 MG tablet Commonly known as: SINEMET IR Take 1 tablet by mouth  3 (three) times daily with meals.   chlorhexidine 4 % external liquid Commonly known as: HIBICLENS Apply 15 mLs (1 Application total) topically as directed for 30 doses. Use as directed daily for 5 days every other week for 6 weeks.   fluticasone 50 MCG/ACT nasal spray Commonly known as: FLONASE Place 1 spray into both nostrils daily. Start taking on: October 01, 2023   memantine 5 MG tablet Commonly known as: Namenda Take 1 tablet (5 mg total) by mouth  2 (two) times daily.   mupirocin ointment 2 % Commonly known as: BACTROBAN Place 1 Application into the nose 2 (two) times daily for 60 doses. Use as directed 2 times daily for 5 days every other week for 6 weeks.   polyethylene glycol 17 g packet Commonly known as: MIRALAX / GLYCOLAX Take 17 g by mouth daily as needed for mild constipation.   QUEtiapine 25 MG tablet Commonly known as: SEROQUEL Take 1 tablet (25 mg total) by mouth at bedtime.        Follow-up Information     Haddix, Gillie Manners, MD. Schedule an appointment as soon as possible for a visit in 2 week(s).   Specialty: Orthopedic Surgery Contact information: [address] [place] [address] [phone]         Margot Ables, MD Follow up in 2 week(s).   Specialty: Family Medicine Why: Hospital follow up Contact information: [address] [place] [address] [phone]                Discharge Exam: Ceasar Mons Weights   09/25/23 0830 09/26/23 0807  Weight: 67.1 kg 67 kg   General exam: Awake, laying in bed, in nad Respiratory system: Normal respiratory effort, no wheezing Cardiovascular system: regular rate, s1, s2 Gastrointestinal system: Soft, nondistended, positive BS Central nervous system: CN2-12 grossly intact, strength intact Extremities: Perfused, no clubbing Skin: Normal skin turgor, no notable skin lesions seen Psychiatry: Mood normal // no visual hallucinations   Condition at discharge: fair  The results of significant diagnostics from this hospitalization (including imaging, microbiology, ancillary and laboratory) are listed below for reference.   Imaging Studies: DG FEMUR, MIN 2 VIEWS RIGHT Result Date: 09/26/2023 CLINICAL DATA:  Intraoperative fluoroscopy for right femoral intramedullary nail fixation. EXAM: RIGHT FEMUR 2 VIEWS COMPARISON:  Pelvis and right hip radiographs 09/25/2023 FINDINGS: Images were performed intraoperatively without the presence of a  radiologist. The patient is undergoing cephalomedullary nail fixation of the previously seen proximal femoral intertrochanteric fracture. Total fluoroscopy images: 6 Total fluoroscopy time: 46 seconds Total dose: Radiation Exposure Index (as provided by the fluoroscopic device): 3.50 mGy air Kerma Please see intraoperative findings for further detail. IMPRESSION: Intraoperative fluoroscopy for right femoral intramedullary nail fixation. Electronically Signed   By: Neita Garnet M.D.   On: 09/26/2023 11:42   DG HIP PORT UNILAT W OR W/O PELVIS 1V RIGHT Result Date: 09/26/2023 CLINICAL DATA:  Status post right intramedullary nail fixation. EXAM: DG HIP (WITH OR WITHOUT PELVIS) 1V PORT RIGHT COMPARISON:  Pelvis and right hip radiographs 09/25/2023, CT right hip 09/25/2023 FINDINGS: There is diffuse decreased bone mineralization. Interval cephalomedullary nail fixation of the previously seen intertrochanteric proximal right femoral fracture. The fracture again extends through the lesser tuberosity with approximately 4 mm fracture line diastasis. Mild-to-moderate right and mild left superior femoroacetabular joint space narrowing and superolateral acetabular degenerative osteophytosis. Mild bilateral sacroiliac and pubic symphysis subchondral sclerosis. IMPRESSION: Interval cephalomedullary nail fixation of the previously seen intertrochanteric proximal right femoral fracture.  Electronically Signed   By: Neita Garnet M.D.   On: 09/26/2023 11:38   DG C-Arm 1-60 Min-No Report Result Date: 09/26/2023 Fluoroscopy was utilized by the requesting physician.  No radiographic interpretation.   CT Hip Right Wo Contrast Result Date: 09/25/2023 CLINICAL DATA:  Right hip pain status post fall. EXAM: CT OF THE RIGHT HIP WITHOUT CONTRAST TECHNIQUE: Multidetector CT imaging of the right hip was performed according to the standard protocol. Multiplanar CT image reconstructions were also generated. RADIATION DOSE REDUCTION: This  exam was performed according to the departmental dose-optimization program which includes automated exposure control, adjustment of the mA and/or kV according to patient size and/or use of iterative reconstruction technique. COMPARISON:  Right hip radiographs dated 09/25/2023. FINDINGS: Bones/Joint/Cartilage Acute mildly displaced intertrochanteric fracture of the proximal right femur with fracture margins extending through the greater and lesser trochanters. There is 4-5 mm of medial displacement of the lesser trochanteric fracture component. The right femoral head is seated within the acetabulum. Motion artifact slightly limits detailed evaluation. No additional fracture identified. The right sacroiliac joint is intact. Soft tissue/Muscles Soft tissue swelling along the lateral right hip. Edema within the right gluteus maximus muscle. No discrete collection identified. Other: Partially visualized prominent distention of the bladder. IMPRESSION: 1. Acute mildly displaced intertrochanteric fracture of the proximal right femur. 2. Partially visualized prominent distention of the bladder. Recommend clinical correlation. Electronically Signed   By: Hart Robinsons M.D.   On: 09/25/2023 11:22   DG Chest 2 View Result Date: 09/25/2023 CLINICAL DATA:  Preoperative evaluation. EXAM: CHEST - 2 VIEW COMPARISON:  Chest radiograph dated 07/18/2023. FINDINGS: The heart size and mediastinal contours are within normal limits. Aortic arch atherosclerosis. No focal consolidation, pleural effusion, or pneumothorax. No acute osseous abnormality. IMPRESSION: 1. No acute cardiopulmonary findings. 2. Aortic atherosclerosis. Electronically Signed   By: Hart Robinsons M.D.   On: 09/25/2023 10:32   DG Hip Unilat W or Wo Pelvis 2-3 Views Right Result Date: 09/25/2023 CLINICAL DATA:  Right hip pain after fall today. EXAM: DG HIP (WITH OR WITHOUT PELVIS) 2-3V RIGHT COMPARISON:  None Available. FINDINGS: There is a possible  nondisplaced fracture involving the intertrochanteric region of the proximal right femur. IMPRESSION: Possible nondisplaced intertrochanteric fracture of proximal right femur. CT scan is recommended for further evaluation. Electronically Signed   By: Lupita Raider M.D.   On: 09/25/2023 09:19   DG Thoracic Spine 2 View Result Date: 09/14/2023 CLINICAL DATA:  Fall EXAM: THORACIC SPINE 2 VIEWS COMPARISON:  None Available. FINDINGS: Thoracic vertebral body heights are preserved with no fracture or subluxation. No suspicious focal osseous lesions. Mild thoracic spondylosis. IMPRESSION: No thoracic spine fracture or subluxation. Mild thoracic spondylosis. Electronically Signed   By: Delbert Phenix M.D.   On: 09/14/2023 11:41   DG Lumbar Spine Complete Result Date: 09/14/2023 CLINICAL DATA:  Fall EXAM: LUMBAR SPINE - COMPLETE 4+ VIEW COMPARISON:  05/11/2021 lumbar spine radiographs FINDINGS: This report assumes 5 non rib-bearing lumbar vertebrae. Lumbar vertebral body heights are preserved, with no fracture. Moderate multilevel lumbar degenerative disc disease, most prominent at L4-5, worsened. No spondylolisthesis. Mild bilateral lower lumbar facet arthropathy. No aggressive appearing focal osseous lesions. IMPRESSION: 1. No lumbar spine fracture or spondylolisthesis. 2. Moderate multilevel lumbar degenerative disc disease, most prominent at L4-5, worsened. Electronically Signed   By: Delbert Phenix M.D.   On: 09/14/2023 11:41    Microbiology: Results for orders placed or performed during the hospital encounter of 09/25/23  Surgical PCR screen  Status: Abnormal   Collection Time: 09/26/23 12:25 AM   Specimen: Nasal Mucosa; Nasal Swab  Result Value Ref Range Status   MRSA, PCR NEGATIVE NEGATIVE Final   Staphylococcus aureus POSITIVE (A) NEGATIVE Final    Comment: (NOTE) The Xpert SA Assay (FDA approved for NASAL specimens in patients 32 years of age and older), is one component of a  comprehensive surveillance program. It is not intended to diagnose infection nor to guide or monitor treatment. Performed at [facility], [address], [place], [place] [address]     Labs: CBC: Recent Labs  Lab 09/25/23 [phone] 09/26/23 0442 09/28/23 0443 09/29/23 0611 09/30/23 0628  WBC 10.5 9.1 8.2 6.7 7.8  NEUTROABS 6.1  --   --   --   --   HGB 11.3* 10.4* 10.2* 11.0* 11.3*  HCT 34.4* 30.4* 29.9* 32.8* 33.8*  MCV 101.5* 96.2 95.2 97.3 97.7  PLT 313 278 263 308 337   Basic Metabolic Panel: Recent Labs  Lab 09/26/23 0442 09/27/23 0514 09/28/23 0443 09/29/23 0611 09/30/23 0628  NA 136 135 137 135 137  K 4.0 4.1 3.9 4.0 4.3  CL 105 101 103 101 104  CO2 22 22 23 22 28   GLUCOSE 91 98 112* 101* 103*  BUN 14 14 19 17 21   CREATININE 0.84 1.04 0.84 0.80 0.88  CALCIUM 8.3* 8.6* 8.7* 8.6* 8.6*   Liver Function Tests: Recent Labs  Lab 09/26/23 0442 09/27/23 0514 09/28/23 0443 09/29/23 0611 09/30/23 0628  AST 18 23 22 26 26   ALT 44 12 23 24 26   ALKPHOS 66 68 69 72 79  BILITOT 1.0 1.2 0.7 0.9 0.7  PROT 4.8* 5.1* 5.0* 5.5* 5.6*  ALBUMIN 2.4* 2.4* 2.3* 2.5* 2.5*   CBG: No results for input(s): "GLUCAP" in the last 168 hours.  Discharge time spent: less than 30 minutes.  Signed: Rickey Barbara, MD [facility] 09/30/2023

## 2023-10-01 ENCOUNTER — Telehealth: Payer: Self-pay | Admitting: Neurology

## 2023-10-01 NOTE — Telephone Encounter (Signed)
 I received an after-hours call message through our answering service this morning.  Patient's wife had called and reporting that patient was diagnosed with "delirium and started rehab because he stopped walking.  He fell last week and has lost 10 pounds, please call". Nurse pool: Please call patient's wife back as requested.  Per chart review, he has had multiple ER visits recently and hospitalization after a closed fracture of the right hip, status post intramedullary intertrochanteric nail placement on 09/26/2023.

## 2023-10-01 NOTE — Telephone Encounter (Signed)
 Called the spouse back and she states that he had a fall in the rehab area and broke his leg. He was sent back yesterday and since his visit here 08/14/23. He was 129lb and is down to 117lb. She states that one of the things Dr Vickey Huger was concerned with was if he was absorbing protein. She wanted to know if we ever received the blood work from where [facility] called and got the verbal. Advised I dont recall that we received those.  She will reach out and see if they were ever done. If not she asked if the rehab can do that. Informed the patient's wife that I'm not sure how the rehab facility works. Informed that typically they have a provider there that will round on patient's and order and they may have someone that checks labs but she would have to check with where he is.  She will look into this and I provided her the labs Dr Vickey Huger wanted ordered as well as the fax number for them to fax the results to once completed.  She verbalized understanding.

## 2023-10-02 LAB — LAB REPORT - SCANNED
Calcium: 9
EGFR: 90
PTH: 20.2
TSH: 1.99 (ref 0.41–5.90)

## 2023-10-03 ENCOUNTER — Telehealth: Payer: Self-pay | Admitting: Neurology

## 2023-10-03 NOTE — Telephone Encounter (Signed)
 Called the the facility and was transferred to a number for nurse but no one picked up and VM as full.  I was unable to LVM, called back and I pressed 6 for director of nursing.  Kelly  Left a detailed message advising that we would like the lab results to be sent to our office. I provided our fax number and what labs I was referring to.   Called the pt's wife and advised I made an attempt to reach out to them. Provided her also with direct fax number

## 2023-10-03 NOTE — Telephone Encounter (Signed)
 Pt's wife, Moua Rasmusson patient is in Rehabilitation at [facility]. They have done 4 different blood test on him; Vitamin B12, Iron-TIBC /Theratin panel Thyroid with TSH, PTH Intact Calcium. Nurse said Dr. Vickey Huger office need ot call verbal request for the results. [facility] contact info [phone]

## 2023-10-06 ENCOUNTER — Other Ambulatory Visit: Payer: Self-pay

## 2023-10-06 ENCOUNTER — Emergency Department (HOSPITAL_COMMUNITY)
Admission: EM | Admit: 2023-10-06 | Discharge: 2023-10-06 | Disposition: A | Discharge status: Home / Self Care | Attending: Emergency Medicine | Admitting: Emergency Medicine

## 2023-10-06 ENCOUNTER — Encounter (HOSPITAL_COMMUNITY): Payer: Self-pay

## 2023-10-06 ENCOUNTER — Emergency Department (HOSPITAL_COMMUNITY)

## 2023-10-06 DIAGNOSIS — F0390 Unspecified dementia without behavioral disturbance: Secondary | ICD-10-CM | POA: Insufficient documentation

## 2023-10-06 DIAGNOSIS — S50811A Abrasion of right forearm, initial encounter: Secondary | ICD-10-CM | POA: Insufficient documentation

## 2023-10-06 DIAGNOSIS — W1830XA Fall on same level, unspecified, initial encounter: Secondary | ICD-10-CM | POA: Insufficient documentation

## 2023-10-06 DIAGNOSIS — Z7982 Long term (current) use of aspirin: Secondary | ICD-10-CM | POA: Insufficient documentation

## 2023-10-06 DIAGNOSIS — S59911A Unspecified injury of right forearm, initial encounter: Secondary | ICD-10-CM | POA: Diagnosis present

## 2023-10-06 DIAGNOSIS — W19XXXA Unspecified fall, initial encounter: Secondary | ICD-10-CM

## 2023-10-06 LAB — CBC
HCT: 37.2 % — ABNORMAL LOW (ref 39.0–52.0)
Hemoglobin: 12.4 g/dL — ABNORMAL LOW (ref 13.0–17.0)
MCH: 33.1 pg (ref 26.0–34.0)
MCHC: 33.3 g/dL (ref 30.0–36.0)
MCV: 99.2 fL (ref 80.0–100.0)
Platelets: 498 10*3/uL — ABNORMAL HIGH (ref 150–400)
RBC: 3.75 MIL/uL — ABNORMAL LOW (ref 4.22–5.81)
RDW: 13.2 % (ref 11.5–15.5)
WBC: 8.2 10*3/uL (ref 4.0–10.5)
nRBC: 0 % (ref 0.0–0.2)

## 2023-10-06 LAB — BASIC METABOLIC PANEL
Anion gap: 11 (ref 5–15)
BUN: 32 mg/dL — ABNORMAL HIGH (ref 8–23)
CO2: 23 mmol/L (ref 22–32)
Calcium: 9.1 mg/dL (ref 8.9–10.3)
Chloride: 108 mmol/L (ref 98–111)
Creatinine, Ser: 0.9 mg/dL (ref 0.61–1.24)
GFR, Estimated: >60 mL/min (ref >60)
Glucose, Bld: 113 mg/dL — ABNORMAL HIGH (ref 70–99)
Potassium: 4.6 mmol/L (ref 3.5–5.1)
Sodium: 142 mmol/L (ref 135–145)

## 2023-10-06 LAB — CBG MONITORING, ED: Glucose-Capillary: 104 mg/dL — ABNORMAL HIGH (ref 70–99)

## 2023-10-06 MED ORDER — ACETAMINOPHEN 325 MG PO TABS
650.0000 mg | ORAL_TABLET | Freq: Once | ORAL | Status: AC
  Administered 2023-10-06: 650 mg via ORAL
  Filled 2023-10-06: qty 2

## 2023-10-06 NOTE — ED Triage Notes (Addendum)
 Pt arrives via [facility] from [facility]. PT had an unwitnessed fall around 1100 this morning. Pt did hit his head, no loc, no blood thinners. Pt arrives in a C-Collar. Pt is oriented to name, dob, and location, confused about the date. Pt does have hx of dementia. He does have an abrasion on right forearm. It is noted during triage that patient's right pupil is slightly larger than left. Both are round and reactive to light.

## 2023-10-06 NOTE — Discharge Instructions (Signed)
 Ernest Campbell:  Thank you for allowing [place] to take care of you today.  We hope you begin feeling better soon.  To-Do: Please follow-up with your primary doctor to discuss any findings from today's emergency department visit. Please return to the Emergency Department or call 911 if you experience chest pain, shortness of breath, severe pain, severe fever, altered mental status, or have any reason to think that you need emergency medical care.  Thank you again.  Hope you feel better soon.  Department of Emergency Medicine [facility]

## 2023-10-06 NOTE — ED Provider Notes (Signed)
 Assume Care Medical Decision Making  Patient care of assumed from previous provider at change of shift. Please see the original provider note from this emergency department encounter for full history and physical.   Briefly, Ernest Campbell is a 73 y.o. male who presents with:  Clinical Course as of 10/06/23 1732  Sun Oct 06, 2023  1540 Hx of dementia and nonverbal, h/f unwitnessed fall at SNF, found on ground next to his bed. Family at bedside. Has abrasions to R elbow. CXR and pelvis XR reassuring. [ ]  f/u [facility] [RK]  1732 F/u CT [RK]    Clinical Course User Index [RK] Caron Presume, MD     Please refer to the original provider's note for additional information regarding the care of Jacob Moores.  Labs reviewed by myself and considered in medical decision making.  Imaging reviewed by myself and considered in medical decision making. Imaging final read interpreted by radiology.  Additional MDM/ED Course: This is a patient with history of dementia and nonverbal at baseline who presented with fall from his SNF.  He was found on the ground next to his bed at his SNF.  Some abrasions to his right elbow. X-rays of his elbow, chest x-ray and pelvis reassuring with no acute fracture alignment  CT head shows no intracranial ICH or skull fracture. CT of the C-spine shows no acute fracture or malalignment of the cervical spine.  C-collar was cleared in the emergency department.  Given his reassuring workup, I believe patient stable for discharge back to SNF. Discussed this with his family at bedside.  They endorse that patient is not back to his baseline.  They feel comfortable plan to discharge.  Disposition:  I discussed the plan for discharge with the patient and/or their surrogate at bedside prior to discharge and they were in agreement with the plan and verbalized understanding of the return precautions provided. All questions answered to the best of my ability. Ultimately, the  patient was discharged in stable condition with stable vital signs. I am reassured that they are capable of close follow up and good social support at home.   Clinical Impression:  1. Fall, initial encounter     Rx / DC Orders ED Discharge Orders     None       The plan for this patient was discussed with Dr. Rhunette Croft, who voiced agreement and who oversaw evaluation and treatment of this patient.    Caron Presume, MD 10/06/23 Alison Stalling    Derwood Kaplan, MD 10/07/23 [phone]

## 2023-10-06 NOTE — ED Provider Notes (Signed)
 [place] EMERGENCY DEPARTMENT AT [facility] Provider Note   CSN: [id number] Arrival date & time: 10/06/23  1335     History  Chief Complaint  Patient presents with   Ernest Campbell is a 73 y.o. male with history of dementia, nonverbal presents following unwitnessed fall.  Is reported to have hit his head.  He is on aspirin.  Family at bedside report that he has been behaving at baseline since.  They are requesting his head to be checked as well as his right hip.  She is approximately 1 week out from total hip arthroplasty.   Fall      Past Medical History:  Diagnosis Date   Dementia (HCC)      Home Medications Prior to Admission medications   Medication Sig Start Date End Date Taking? Authorizing Provider  acetaminophen (TYLENOL) 325 MG tablet Take 2 tablets (650 mg total) by mouth every 6 (six) hours as needed for mild pain (pain score 1-3) or moderate pain (pain score 4-6). 09/28/23   Montez Morita, PA-C  aspirin 325 MG tablet Take 1 tablet (325 mg total) by mouth daily. 09/29/23 10/29/23  Montez Morita, PA-C  aspirin EC 81 MG tablet Take 1 tablet (81 mg total) by mouth daily. Swallow whole. 07/22/23   Lanae Boast, MD  carbidopa-levodopa (SINEMET IR) 25-100 MG tablet Take 1 tablet by mouth 3 (three) times daily with meals. 08/14/23   Dohmeier, Porfirio Mylar, MD  chlorhexidine (HIBICLENS) 4 % external liquid Apply 15 mLs (1 Application total) topically as directed for 30 doses. Use as directed daily for 5 days every other week for 6 weeks. 09/26/23   West Bali, PA-C  fluticasone (FLONASE) 50 MCG/ACT nasal spray Place 1 spray into both nostrils daily. 10/01/23   Jerald Kief, MD  memantine (NAMENDA) 5 MG tablet Take 1 tablet (5 mg total) by mouth 2 (two) times daily. 09/03/23   Dohmeier, Porfirio Mylar, MD  mupirocin ointment (BACTROBAN) 2 % Place 1 Application into the nose 2 (two) times daily for 60 doses. Use as directed 2 times daily for 5 days every other week for 6  weeks. 09/26/23 10/26/23  West Bali, PA-C  polyethylene glycol (MIRALAX / GLYCOLAX) 17 g packet Take 17 g by mouth daily as needed for mild constipation. 09/30/23   Jerald Kief, MD  QUEtiapine (SEROQUEL) 25 MG tablet Take 1 tablet (25 mg total) by mouth at bedtime. 09/30/23 10/30/23  Jerald Kief, MD      Allergies    Fentanyl and related, Other, Prednisone, Acetaminophen, Codeine, Morphine, and Oxycodone    Review of Systems   Review of Systems  Skin:  Positive for wound.    Physical Exam Updated Vital Signs BP 111/80   Pulse 74   Temp 97.8 F (36.6 C)   Resp 17   SpO2 97%  Physical Exam Vitals and nursing note reviewed.  Constitutional:      General: He is not in acute distress.    Appearance: He is well-developed.  HENT:     Head: Normocephalic and atraumatic.  Eyes:     Conjunctiva/sclera: Conjunctivae normal.  Cardiovascular:     Rate and Rhythm: Normal rate and regular rhythm.     Heart sounds: No murmur heard. Pulmonary:     Effort: Pulmonary effort is normal. No respiratory distress.     Breath sounds: Normal breath sounds.  Abdominal:     Palpations: Abdomen is soft.  Tenderness: There is no abdominal tenderness.  Musculoskeletal:        General: No swelling.     Cervical back: Neck supple.  Skin:    General: Skin is warm and dry.     Capillary Refill: Capillary refill takes less than 2 seconds.     Comments: Abrasion to right extensor surface of forearm.  Minimally tender.  No gross deformities.  No surrounding ecchymosis or swelling.  Tolerates full range of motion of elbow  Neurological:     Mental Status: He is alert.     Comments: Patient appears to be at his baseline.  He has dementia and is predominantly nonverbal.  No obvious facial droop. Moves all extremities spontaneously. 5/5 strength in upper and lower extremities.  Does not report a sensation deficit.  Does not participate in finger-nose or testing nystagmus/EOM.as mentioned in triage  note his right pupil appears to be very minimally larger than left.  Estimated 3 mm to 2.5 respectively.  PERRL.   Psychiatric:        Mood and Affect: Mood normal.     ED Results / Procedures / Treatments   Labs (all labs ordered are listed, but only abnormal results are displayed) Labs Reviewed  BASIC METABOLIC PANEL - Abnormal; Notable for the following components:      Result Value   Glucose, Bld 113 (*)    BUN 32 (*)    All other components within normal limits  CBC - Abnormal; Notable for the following components:   RBC 3.75 (*)    Hemoglobin 12.4 (*)    HCT 37.2 (*)    Platelets 498 (*)    All other components within normal limits  CBG MONITORING, ED - Abnormal; Notable for the following components:   Glucose-Capillary 104 (*)    All other components within normal limits  URINALYSIS, ROUTINE W REFLEX MICROSCOPIC    EKG EKG Interpretation Date/Time:  Sunday October 06 2023 13:44:46 EST Ventricular Rate:  75 PR Interval:  120 QRS Duration:  90 QT Interval:  377 QTC Calculation: 421 R Axis:   62  Text Interpretation: Sinus rhythm Consider right atrial enlargement Artifact in lead(s) II III aVR aVL aVF V1 V2 V3 V4 V5 V6 Confirmed by Pricilla Loveless [phone]) on 10/06/2023 2:20:36 PM  Radiology DG Elbow 2 Views Right Result Date: 10/06/2023 CLINICAL DATA:  Un witnessed fall EXAM: RIGHT ELBOW - 2 VIEW COMPARISON:  None Available. FINDINGS: Frontal and lateral views of the right elbow are obtained. No acute fracture, subluxation, or dislocation. Joint spaces are well preserved. Soft tissues are unremarkable. IMPRESSION: 1. Unremarkable right elbow. Electronically Signed   By: Sharlet Salina M.D.   On: 10/06/2023 15:33   DG Hip Unilat W or Wo Pelvis 2-3 Views Right Result Date: 10/06/2023 CLINICAL DATA:  Un witnessed fall EXAM: DG HIP (WITH OR WITHOUT PELVIS) 2-3V RIGHT COMPARISON:  09/26/2023 FINDINGS: Frontal view of the pelvis as well as frontal and frogleg lateral views of the  right hip are obtained. The intramedullary rod with proximal dynamic screws and distal interlocking screw seen on prior study are again noted and unchanged. The previous intertrochanteric right hip fracture remains in anatomic alignment. No new acute fractures are identified. Alignment is anatomic. Stable degenerative changes of the bilateral hips and lower lumbar spine. IMPRESSION: 1. Stable appearance of the previous intertrochanteric right hip fracture and orthopedic hardware, with anatomic alignment. 2. No new acute bony abnormalities. Electronically Signed   By: Maxwell Caul.D.  On: 10/06/2023 15:33   DG Chest Portable 1 View Result Date: 10/06/2023 CLINICAL DATA:  Un witnessed fall, hit head EXAM: PORTABLE CHEST 1 VIEW COMPARISON:  09/25/2023 FINDINGS: The heart size and mediastinal contours are within normal limits. Both lungs are clear. The visualized skeletal structures are unremarkable. IMPRESSION: No active disease. Electronically Signed   By: Sharlet Salina M.D.   On: 10/06/2023 15:31    Procedures Procedures    Medications Ordered in ED Medications  acetaminophen (TYLENOL) tablet 650 mg (has no administration in time range)    ED Course/ Medical Decision Making/ A&P Clinical Course as of 10/06/23 1550  Sun Oct 06, 2023  1540 Hx of dementia and nonverbal, h/f unwitnessed fall at SNF, found on ground next to his bed. Family at bedside. Has abrasions to R elbow. CXR and pelvis XR reassuring. [ ]  f/u [facility] [RK]    Clinical Course User Index [RK] Caron Presume, MD                                 Medical Decision Making  This patient presents to the ED with chief complaint(s) of fall.  The complaint involves an extensive differential diagnosis and also carries with it a high risk of complications and morbidity.   pertinent past medical history as listed in HPI  The differential diagnosis includes  Mechanical fall, syncope, cardiac event, intracranial hemorrhage, fracture,  dislocation The initial plan is to  Will start with imaging and basic labs and EKG Additional history obtained: Additional history obtained from family Records reviewed Care Everywhere/External Records  Initial Assessment:   Hemodynamically stable, nontoxic-appearing patient with dementia, prominent dominantly nonverbal and ambulatory presents with unwitnessed fall that he suggested to EMS he had hit his head.  He has no significant focal findings.  He does not express tenderness to right hip.  Of which he is now approximately 1 week out from total hip arthroplasty.  He has a mild abrasion to the right elbow.  Full range of motion without significant tenderness.  Independent ECG interpretation:  Sinus rhythm without ischemic changes  Independent labs interpretation:  The following labs were independently interpreted:  BMP and CBC without significant abnormality  Independent visualization and interpretation of imaging: I independently visualized the following imaging with scope of interpretation limited to determining acute life threatening conditions related to emergency care: Right elbow , which revealed no acute abnormality Hip showed stable hardware Chest x-ray no acute abnormality CT head and cervical spine pending Treatment and Reassessment: Patient given Tylenol 650 following first assessment  Consultations obtained:   Pending  Disposition:   Signout given to Dr. Craige Cotta, disposition pending workup.  Social Determinants of Health:   none  This note was dictated with voice recognition software.  Despite best efforts at proofreading, errors may have occurred which can change the documentation meaning.          Final Clinical Impression(s) / ED Diagnoses Final diagnoses:  Fall, initial encounter    Rx / DC Orders ED Discharge Orders     None         Fabienne Bruns 10/06/23 1551    Pricilla Loveless, MD 10/08/23 [phone]

## 2023-10-06 NOTE — ED Notes (Signed)
 Attempted to assist patient with urinal. Unable to provide urine at this time.

## 2023-10-06 NOTE — ED Notes (Addendum)
 Pt resting comfortably at this time with no signs of distress.   Pt bedding changed and new diaper applied.   Family sitting with pt.   Pressure injury irrigated with NS and cleaned with sterile gauze.   Unable to collect urine. Pt not following coaching techniques to use urinal with assistance.

## 2023-10-14 ENCOUNTER — Telehealth: Payer: Self-pay | Admitting: Neurology

## 2023-10-14 NOTE — Telephone Encounter (Signed)
 Called patient wife Ernest Campbell and informed results have been received and provider has not reviewed results yet.

## 2023-10-14 NOTE — Telephone Encounter (Signed)
 Pt's wife called wanting to speak to the RN regarding blood test results that were faxed to the office from [place] from last week. Please advise.

## 2023-10-17 NOTE — Telephone Encounter (Signed)
 Pt's wife called stating that the pt is steadily losing weight and she is wanting to know when she will be getting a call with the lab results. She states this is urgent due to his malnutrition. Please advise.

## 2023-10-17 NOTE — Telephone Encounter (Signed)
 Received paper lab from Dr.Dohmeier with note attached to Rx stating can start a prenatal vitamin that has B12,D2,folic acid and iron.   Called [name] (wife) and informed her with recommendations. She verbalized she understood.

## 2023-10-24 ENCOUNTER — Other Ambulatory Visit: Payer: Self-pay

## 2023-10-24 ENCOUNTER — Inpatient Hospital Stay (HOSPITAL_COMMUNITY)
Admission: EM | Admit: 2023-10-24 | Discharge: 2023-10-29 | DRG: 480 | Disposition: A | Discharge status: Skilled Nursing Facility | Source: Skilled Nursing Facility | Attending: Internal Medicine | Admitting: Internal Medicine

## 2023-10-24 ENCOUNTER — Inpatient Hospital Stay (HOSPITAL_COMMUNITY)

## 2023-10-24 ENCOUNTER — Encounter (HOSPITAL_COMMUNITY): Payer: Self-pay | Admitting: Internal Medicine

## 2023-10-24 ENCOUNTER — Inpatient Hospital Stay (HOSPITAL_COMMUNITY): Admitting: Anesthesiology

## 2023-10-24 ENCOUNTER — Emergency Department (HOSPITAL_COMMUNITY)

## 2023-10-24 ENCOUNTER — Encounter (HOSPITAL_COMMUNITY)
Admission: EM | Disposition: A | Discharge status: Skilled Nursing Facility | Payer: Self-pay | Source: Skilled Nursing Facility | Attending: Internal Medicine

## 2023-10-24 DIAGNOSIS — Z9181 History of falling: Secondary | ICD-10-CM

## 2023-10-24 DIAGNOSIS — M9701XA Periprosthetic fracture around internal prosthetic right hip joint, initial encounter: Secondary | ICD-10-CM | POA: Diagnosis present

## 2023-10-24 DIAGNOSIS — S72141A Displaced intertrochanteric fracture of right femur, initial encounter for closed fracture: Secondary | ICD-10-CM | POA: Diagnosis present

## 2023-10-24 DIAGNOSIS — Z803 Family history of malignant neoplasm of breast: Secondary | ICD-10-CM

## 2023-10-24 DIAGNOSIS — Z96641 Presence of right artificial hip joint: Secondary | ICD-10-CM | POA: Diagnosis present

## 2023-10-24 DIAGNOSIS — Z87820 Personal history of traumatic brain injury: Secondary | ICD-10-CM | POA: Diagnosis not present

## 2023-10-24 DIAGNOSIS — S7291XA Unspecified fracture of right femur, initial encounter for closed fracture: Secondary | ICD-10-CM

## 2023-10-24 DIAGNOSIS — L89152 Pressure ulcer of sacral region, stage 2: Secondary | ICD-10-CM | POA: Diagnosis present

## 2023-10-24 DIAGNOSIS — Z79899 Other long term (current) drug therapy: Secondary | ICD-10-CM | POA: Diagnosis not present

## 2023-10-24 DIAGNOSIS — I639 Cerebral infarction, unspecified: Secondary | ICD-10-CM

## 2023-10-24 DIAGNOSIS — Z6820 Body mass index (BMI) 20.0-20.9, adult: Secondary | ICD-10-CM

## 2023-10-24 DIAGNOSIS — F17210 Nicotine dependence, cigarettes, uncomplicated: Secondary | ICD-10-CM | POA: Diagnosis present

## 2023-10-24 DIAGNOSIS — M1990 Unspecified osteoarthritis, unspecified site: Secondary | ICD-10-CM | POA: Diagnosis present

## 2023-10-24 DIAGNOSIS — I69811 Memory deficit following other cerebrovascular disease: Secondary | ICD-10-CM

## 2023-10-24 DIAGNOSIS — Z886 Allergy status to analgesic agent status: Secondary | ICD-10-CM | POA: Diagnosis not present

## 2023-10-24 DIAGNOSIS — I69818 Other symptoms and signs involving cognitive functions following other cerebrovascular disease: Secondary | ICD-10-CM | POA: Diagnosis not present

## 2023-10-24 DIAGNOSIS — Z7982 Long term (current) use of aspirin: Secondary | ICD-10-CM | POA: Diagnosis not present

## 2023-10-24 DIAGNOSIS — S72141D Displaced intertrochanteric fracture of right femur, subsequent encounter for closed fracture with routine healing: Secondary | ICD-10-CM

## 2023-10-24 DIAGNOSIS — Y92129 Unspecified place in nursing home as the place of occurrence of the external cause: Secondary | ICD-10-CM | POA: Diagnosis not present

## 2023-10-24 DIAGNOSIS — D539 Nutritional anemia, unspecified: Secondary | ICD-10-CM | POA: Diagnosis present

## 2023-10-24 DIAGNOSIS — G20A1 Parkinson's disease without dyskinesia, without mention of fluctuations: Secondary | ICD-10-CM | POA: Diagnosis present

## 2023-10-24 DIAGNOSIS — I69311 Memory deficit following cerebral infarction: Secondary | ICD-10-CM | POA: Diagnosis not present

## 2023-10-24 DIAGNOSIS — S069XAA Unspecified intracranial injury with loss of consciousness status unknown, initial encounter: Secondary | ICD-10-CM | POA: Diagnosis present

## 2023-10-24 DIAGNOSIS — S069X0S Unspecified intracranial injury without loss of consciousness, sequela: Secondary | ICD-10-CM | POA: Diagnosis not present

## 2023-10-24 DIAGNOSIS — W06XXXA Fall from bed, initial encounter: Secondary | ICD-10-CM | POA: Diagnosis present

## 2023-10-24 DIAGNOSIS — E43 Unspecified severe protein-calorie malnutrition: Secondary | ICD-10-CM | POA: Diagnosis present

## 2023-10-24 DIAGNOSIS — I69318 Other symptoms and signs involving cognitive functions following cerebral infarction: Secondary | ICD-10-CM

## 2023-10-24 DIAGNOSIS — D62 Acute posthemorrhagic anemia: Secondary | ICD-10-CM | POA: Diagnosis not present

## 2023-10-24 DIAGNOSIS — Z818 Family history of other mental and behavioral disorders: Secondary | ICD-10-CM

## 2023-10-24 DIAGNOSIS — Y92122 Bedroom in nursing home as the place of occurrence of the external cause: Secondary | ICD-10-CM | POA: Diagnosis not present

## 2023-10-24 DIAGNOSIS — Z885 Allergy status to narcotic agent status: Secondary | ICD-10-CM

## 2023-10-24 DIAGNOSIS — F0280 Dementia in other diseases classified elsewhere without behavioral disturbance: Secondary | ICD-10-CM | POA: Diagnosis present

## 2023-10-24 DIAGNOSIS — I69398 Other sequelae of cerebral infarction: Secondary | ICD-10-CM | POA: Diagnosis not present

## 2023-10-24 DIAGNOSIS — S7290XA Unspecified fracture of unspecified femur, initial encounter for closed fracture: Secondary | ICD-10-CM | POA: Diagnosis present

## 2023-10-24 DIAGNOSIS — Z87898 Personal history of other specified conditions: Secondary | ICD-10-CM

## 2023-10-24 DIAGNOSIS — S72331A Displaced oblique fracture of shaft of right femur, initial encounter for closed fracture: Secondary | ICD-10-CM | POA: Diagnosis present

## 2023-10-24 DIAGNOSIS — Z823 Family history of stroke: Secondary | ICD-10-CM | POA: Diagnosis not present

## 2023-10-24 DIAGNOSIS — Z820 Family history of epilepsy and other diseases of the nervous system: Secondary | ICD-10-CM

## 2023-10-24 DIAGNOSIS — Z888 Allergy status to other drugs, medicaments and biological substances status: Secondary | ICD-10-CM

## 2023-10-24 DIAGNOSIS — Z87892 Personal history of anaphylaxis: Secondary | ICD-10-CM

## 2023-10-24 HISTORY — PX: ORIF FEMUR FRACTURE: SHX2119

## 2023-10-24 HISTORY — DX: Cerebral infarction, unspecified: I63.9

## 2023-10-24 HISTORY — DX: Anemia, unspecified: D64.9

## 2023-10-24 LAB — FOLATE: Folate: 11 ng/mL (ref >5.9)

## 2023-10-24 LAB — CBC WITH DIFFERENTIAL/PLATELET
Abs Immature Granulocytes: 0.14 10*3/uL — ABNORMAL HIGH (ref 0.00–0.07)
Basophils Absolute: 0.1 10*3/uL (ref 0.0–0.1)
Basophils Relative: 1 %
Eosinophils Absolute: 0.1 10*3/uL (ref 0.0–0.5)
Eosinophils Relative: 1 %
HCT: 32.2 % — ABNORMAL LOW (ref 39.0–52.0)
Hemoglobin: 10.6 g/dL — ABNORMAL LOW (ref 13.0–17.0)
Immature Granulocytes: 1 %
Lymphocytes Relative: 26 %
Lymphs Abs: 3 10*3/uL (ref 0.7–4.0)
MCH: 33.1 pg (ref 26.0–34.0)
MCHC: 32.9 g/dL (ref 30.0–36.0)
MCV: 100.6 fL — ABNORMAL HIGH (ref 80.0–100.0)
Monocytes Absolute: 1.3 10*3/uL — ABNORMAL HIGH (ref 0.1–1.0)
Monocytes Relative: 11 %
Neutro Abs: 6.9 10*3/uL (ref 1.7–7.7)
Neutrophils Relative %: 60 %
Platelets: 294 10*3/uL (ref 150–400)
RBC: 3.2 MIL/uL — ABNORMAL LOW (ref 4.22–5.81)
RDW: 13.3 % (ref 11.5–15.5)
WBC: 11.5 10*3/uL — ABNORMAL HIGH (ref 4.0–10.5)
nRBC: 0 % (ref 0.0–0.2)

## 2023-10-24 LAB — COMPREHENSIVE METABOLIC PANEL
ALT: 20 U/L (ref 0–44)
AST: 20 U/L (ref 15–41)
Albumin: 3.2 g/dL — ABNORMAL LOW (ref 3.5–5.0)
Alkaline Phosphatase: 133 U/L — ABNORMAL HIGH (ref 38–126)
Anion gap: 9 (ref 5–15)
BUN: 26 mg/dL — ABNORMAL HIGH (ref 8–23)
CO2: 23 mmol/L (ref 22–32)
Calcium: 8.6 mg/dL — ABNORMAL LOW (ref 8.9–10.3)
Chloride: 107 mmol/L (ref 98–111)
Creatinine, Ser: 0.82 mg/dL (ref 0.61–1.24)
GFR, Estimated: >60 mL/min (ref >60)
Glucose, Bld: 111 mg/dL — ABNORMAL HIGH (ref 70–99)
Potassium: 4.3 mmol/L (ref 3.5–5.1)
Sodium: 139 mmol/L (ref 135–145)
Total Bilirubin: 0.6 mg/dL (ref 0.0–1.2)
Total Protein: 6.1 g/dL — ABNORMAL LOW (ref 6.5–8.1)

## 2023-10-24 LAB — PROTIME-INR
INR: 1 (ref 0.8–1.2)
Prothrombin Time: 13.5 s (ref 11.4–15.2)

## 2023-10-24 LAB — GLUCOSE, CAPILLARY
Glucose-Capillary: 117 mg/dL — ABNORMAL HIGH (ref 70–99)
Glucose-Capillary: 126 mg/dL — ABNORMAL HIGH (ref 70–99)

## 2023-10-24 LAB — CBG MONITORING, ED: Glucose-Capillary: 97 mg/dL (ref 70–99)

## 2023-10-24 LAB — TYPE AND SCREEN
ABO/RH(D): O POS
Antibody Screen: NEGATIVE

## 2023-10-24 LAB — VITAMIN B12: Vitamin B-12: 423 pg/mL (ref 180–914)

## 2023-10-24 SURGERY — OPEN REDUCTION INTERNAL FIXATION FEMORAL SHAFT FRACTURE
Anesthesia: General | Laterality: Right

## 2023-10-24 MED ORDER — METHOCARBAMOL 1000 MG/10ML IJ SOLN: 500.0000 mg | Freq: Four times a day (QID) | INTRAMUSCULAR | Status: DC | PRN

## 2023-10-24 MED ORDER — CEFAZOLIN SODIUM-DEXTROSE 2-4 GM/100ML-% IV SOLN
2.0000 g | Freq: Three times a day (TID) | INTRAVENOUS | Status: AC
  Administered 2023-10-25 (×2): 2 g via INTRAVENOUS
  Filled 2023-10-24: qty 100

## 2023-10-24 MED ORDER — SUGAMMADEX SODIUM 200 MG/2ML IV SOLN
INTRAVENOUS | Status: DC | PRN
  Administered 2023-10-24: 200 mg via INTRAVENOUS

## 2023-10-24 MED ORDER — CHLORHEXIDINE GLUCONATE 4 % EX SOLN
60.0000 mL | Freq: Once | CUTANEOUS | Status: AC
  Administered 2023-10-24: 4 via TOPICAL

## 2023-10-24 MED ORDER — ORAL CARE MOUTH RINSE: 15.0000 mL | Freq: Once | OROMUCOSAL | Status: AC

## 2023-10-24 MED ORDER — HYDROCODONE-ACETAMINOPHEN 5-325 MG PO TABS: 1.0000 | ORAL_TABLET | ORAL | Status: DC | PRN

## 2023-10-24 MED ORDER — POVIDONE-IODINE 10 % EX SWAB
2.0000 | Freq: Once | CUTANEOUS | Status: AC
  Administered 2023-10-24: 2 via TOPICAL

## 2023-10-24 MED ORDER — DEXMEDETOMIDINE HCL IN NACL 80 MCG/20ML IV SOLN
INTRAVENOUS | Status: DC | PRN
  Administered 2023-10-24: 10 ug via INTRAVENOUS

## 2023-10-24 MED ORDER — VANCOMYCIN HCL 1000 MG IV SOLR
INTRAVENOUS | Status: DC | PRN
  Administered 2023-10-24: 1000 mg

## 2023-10-24 MED ORDER — VANCOMYCIN HCL 1000 MG IV SOLR
INTRAVENOUS | Status: AC
  Filled 2023-10-24: qty 20

## 2023-10-24 MED ORDER — ONDANSETRON HCL 4 MG PO TABS: 4.0000 mg | ORAL_TABLET | Freq: Four times a day (QID) | ORAL | Status: DC | PRN

## 2023-10-24 MED ORDER — ACETAMINOPHEN 325 MG PO TABS
650.0000 mg | ORAL_TABLET | Freq: Four times a day (QID) | ORAL | Status: AC
  Administered 2023-10-24 – 2023-10-25 (×4): 650 mg via ORAL
  Filled 2023-10-24 (×4): qty 2

## 2023-10-24 MED ORDER — SODIUM CHLORIDE 0.9 % IV SOLN: INTRAVENOUS | Status: DC

## 2023-10-24 MED ORDER — METOCLOPRAMIDE HCL 5 MG/ML IJ SOLN: 5.0000 mg | Freq: Three times a day (TID) | INTRAMUSCULAR | Status: DC | PRN

## 2023-10-24 MED ORDER — PHENYLEPHRINE HCL-NACL 20-0.9 MG/250ML-% IV SOLN
INTRAVENOUS | Status: DC | PRN
  Administered 2023-10-24: 40 ug/min via INTRAVENOUS

## 2023-10-24 MED ORDER — DOCUSATE SODIUM 100 MG PO CAPS
100.0000 mg | ORAL_CAPSULE | Freq: Two times a day (BID) | ORAL | Status: DC
  Administered 2023-10-24 – 2023-10-29 (×8): 100 mg via ORAL
  Filled 2023-10-24 (×9): qty 1

## 2023-10-24 MED ORDER — LIDOCAINE 2% (20 MG/ML) 5 ML SYRINGE
INTRAMUSCULAR | Status: DC | PRN
  Administered 2023-10-24: 50 mg via INTRAVENOUS

## 2023-10-24 MED ORDER — METHOCARBAMOL 500 MG PO TABS
500.0000 mg | ORAL_TABLET | Freq: Four times a day (QID) | ORAL | Status: DC | PRN
  Administered 2023-10-25: 500 mg via ORAL
  Filled 2023-10-24: qty 1

## 2023-10-24 MED ORDER — CHLORHEXIDINE GLUCONATE 0.12 % MT SOLN
15.0000 mL | Freq: Once | OROMUCOSAL | Status: AC
  Administered 2023-10-24: 15 mL via OROMUCOSAL
  Filled 2023-10-24: qty 15

## 2023-10-24 MED ORDER — PHENYLEPHRINE 80 MCG/ML (10ML) SYRINGE FOR IV PUSH (FOR BLOOD PRESSURE SUPPORT)
PREFILLED_SYRINGE | INTRAVENOUS | Status: DC | PRN
Start: 2023-10-24 — End: 2023-10-24
  Administered 2023-10-24 (×4): 160 ug via INTRAVENOUS

## 2023-10-24 MED ORDER — CEFAZOLIN SODIUM-DEXTROSE 2-4 GM/100ML-% IV SOLN
2.0000 g | Freq: Three times a day (TID) | INTRAVENOUS | Status: DC
  Administered 2023-10-24: 2 g via INTRAVENOUS
  Filled 2023-10-24 (×2): qty 100

## 2023-10-24 MED ORDER — ASPIRIN 325 MG PO TABS
325.0000 mg | ORAL_TABLET | Freq: Every day | ORAL | Status: DC
  Administered 2023-10-25 – 2023-10-29 (×5): 325 mg via ORAL
  Filled 2023-10-24 (×5): qty 1

## 2023-10-24 MED ORDER — CEFAZOLIN SODIUM-DEXTROSE 2-4 GM/100ML-% IV SOLN
2.0000 g | INTRAVENOUS | Status: AC
  Administered 2023-10-24: 2 g via INTRAVENOUS
  Filled 2023-10-24: qty 100

## 2023-10-24 MED ORDER — HYDROMORPHONE HCL 1 MG/ML IJ SOLN
INTRAMUSCULAR | Status: DC | PRN
  Administered 2023-10-24: .5 mg via INTRAVENOUS

## 2023-10-24 MED ORDER — METOCLOPRAMIDE HCL 5 MG PO TABS: 5.0000 mg | ORAL_TABLET | Freq: Three times a day (TID) | ORAL | Status: DC | PRN

## 2023-10-24 MED ORDER — POLYETHYLENE GLYCOL 3350 17 G PO PACK: 17.0000 g | PACK | Freq: Every day | ORAL | Status: DC | PRN

## 2023-10-24 MED ORDER — CARBIDOPA-LEVODOPA 25-100 MG PO TABS
1.0000 | ORAL_TABLET | Freq: Three times a day (TID) | ORAL | Status: DC
  Administered 2023-10-24 – 2023-10-29 (×15): 1 via ORAL
  Filled 2023-10-24 (×17): qty 1

## 2023-10-24 MED ORDER — LACTATED RINGERS IV SOLN: INTRAVENOUS | Status: DC

## 2023-10-24 MED ORDER — HYDRALAZINE HCL 10 MG PO TABS: 10.0000 mg | ORAL_TABLET | Freq: Four times a day (QID) | ORAL | Status: DC | PRN

## 2023-10-24 MED ORDER — MORPHINE SULFATE (PF) 2 MG/ML IV SOLN: 0.5000 mg | INTRAVENOUS | Status: DC | PRN

## 2023-10-24 MED ORDER — DIPHENHYDRAMINE HCL 12.5 MG/5ML PO ELIX: 12.5000 mg | ORAL_SOLUTION | ORAL | Status: DC | PRN

## 2023-10-24 MED ORDER — HYDROMORPHONE HCL 1 MG/ML IJ SOLN: 0.2500 mg | INTRAMUSCULAR | Status: DC | PRN

## 2023-10-24 MED ORDER — 0.9 % SODIUM CHLORIDE (POUR BTL) OPTIME
TOPICAL | Status: DC | PRN
  Administered 2023-10-24: 1000 mL

## 2023-10-24 MED ORDER — AMISULPRIDE (ANTIEMETIC) 5 MG/2ML IV SOLN: 10.0000 mg | Freq: Once | INTRAVENOUS | Status: DC | PRN

## 2023-10-24 MED ORDER — ONDANSETRON HCL 4 MG/2ML IJ SOLN: 4.0000 mg | Freq: Four times a day (QID) | INTRAMUSCULAR | Status: DC | PRN

## 2023-10-24 MED ORDER — ONDANSETRON HCL 4 MG/2ML IJ SOLN
INTRAMUSCULAR | Status: DC | PRN
  Administered 2023-10-24: 4 mg via INTRAVENOUS

## 2023-10-24 MED ORDER — HYDROMORPHONE HCL 1 MG/ML IJ SOLN
INTRAMUSCULAR | Status: AC
  Filled 2023-10-24: qty 0.5

## 2023-10-24 MED ORDER — ACETAMINOPHEN 325 MG PO TABS: 325.0000 mg | ORAL_TABLET | Freq: Four times a day (QID) | ORAL | Status: DC | PRN

## 2023-10-24 MED ORDER — TRANEXAMIC ACID-NACL 1000-0.7 MG/100ML-% IV SOLN
1000.0000 mg | Freq: Once | INTRAVENOUS | Status: AC
  Administered 2023-10-24: 1000 mg via INTRAVENOUS
  Filled 2023-10-24: qty 100

## 2023-10-24 MED ORDER — SUGAMMADEX SODIUM 200 MG/2ML IV SOLN
INTRAVENOUS | Status: AC
  Filled 2023-10-24: qty 2

## 2023-10-24 MED ORDER — ROCURONIUM BROMIDE 10 MG/ML (PF) SYRINGE
PREFILLED_SYRINGE | INTRAVENOUS | Status: DC | PRN
  Administered 2023-10-24: 50 mg via INTRAVENOUS

## 2023-10-24 MED ORDER — PROPOFOL 10 MG/ML IV BOLUS
INTRAVENOUS | Status: DC | PRN
  Administered 2023-10-24: 70 mg via INTRAVENOUS
  Administered 2023-10-24: 30 mg via INTRAVENOUS

## 2023-10-24 SURGICAL SUPPLY — 58 items
BAG COUNTER SPONGE SURGICOUNT (BAG) ×2 IMPLANT
BIT DRILL 4.3X300MM (BIT) IMPLANT
BIT DRILL LONG 3.3 (BIT) IMPLANT
BIT DRILL QC 3.3X195 (BIT) IMPLANT
BNDG ELASTIC 4INX 5YD STR LF (GAUZE/BANDAGES/DRESSINGS) IMPLANT
BNDG ELASTIC 6INX 5YD STR LF (GAUZE/BANDAGES/DRESSINGS) IMPLANT
BRUSH SCRUB EZ PLAIN DRY (MISCELLANEOUS) ×4 IMPLANT
CABLE CERLAGE W/CRIMP 1.8 (Cable) IMPLANT
CAP LOCK NCB (Cap) IMPLANT
CHLORAPREP W/TINT 26 (MISCELLANEOUS) ×4 IMPLANT
COVER SURGICAL LIGHT HANDLE (MISCELLANEOUS) ×4 IMPLANT
DERMABOND ADVANCED .7 DNX12 (GAUZE/BANDAGES/DRESSINGS) IMPLANT
DRAPE C-ARM 42X72 X-RAY (DRAPES) ×2 IMPLANT
DRAPE C-ARMOR (DRAPES) ×2 IMPLANT
DRAPE IMP U-DRAPE 54X76 (DRAPES) ×2 IMPLANT
DRAPE U-SHAPE 47X51 STRL (DRAPES) ×2 IMPLANT
DRESSING MEPILEX FLEX 4X4 (GAUZE/BANDAGES/DRESSINGS) IMPLANT
DRSG ADAPTIC 3X8 NADH LF (GAUZE/BANDAGES/DRESSINGS) ×2 IMPLANT
DRSG MEPILEX FLEX 4X4 (GAUZE/BANDAGES/DRESSINGS) ×1 IMPLANT
DRSG MEPILEX POST OP 4X12 (GAUZE/BANDAGES/DRESSINGS) IMPLANT
ELECT REM PT RETURN 9FT ADLT (ELECTROSURGICAL) ×1 IMPLANT
ELECTRODE REM PT RTRN 9FT ADLT (ELECTROSURGICAL) ×2 IMPLANT
EVACUATOR 1/8 PVC DRAIN (DRAIN) IMPLANT
GAUZE PAD ABD 8X10 STRL (GAUZE/BANDAGES/DRESSINGS) ×8 IMPLANT
GLOVE BIO SURGEON STRL SZ 6.5 (GLOVE) ×6 IMPLANT
GLOVE BIO SURGEON STRL SZ7.5 (GLOVE) ×8 IMPLANT
GLOVE BIOGEL PI IND STRL 6.5 (GLOVE) ×2 IMPLANT
GLOVE BIOGEL PI IND STRL 7.5 (GLOVE) ×2 IMPLANT
GOWN STRL REUS W/ TWL LRG LVL3 (GOWN DISPOSABLE) ×4 IMPLANT
K-WIRE FXSTD 280X2XNS SS (WIRE) ×1 IMPLANT
KIT BASIN OR (CUSTOM PROCEDURE TRAY) ×2 IMPLANT
KIT TURNOVER KIT B (KITS) ×2 IMPLANT
KWIRE FXSTD 280X2XNS SS (WIRE) IMPLANT
MANIFOLD NEPTUNE II (INSTRUMENTS) ×2 IMPLANT
NS IRRIG 1000ML POUR BTL (IV SOLUTION) ×2 IMPLANT
PACK TOTAL JOINT (CUSTOM PROCEDURE TRAY) ×2 IMPLANT
PAD ARMBOARD POSITIONER FOAM (MISCELLANEOUS) ×4 IMPLANT
PAD CAST 4YDX4 CTTN HI CHSV (CAST SUPPLIES) IMPLANT
PADDING CAST COTTON 6X4 STRL (CAST SUPPLIES) IMPLANT
PLATE PROXIMAL FEMUR 12H RT (Plate) IMPLANT
SCREW CORT NCB SELFTAP 5.0X42 (Screw) IMPLANT
SCREW CORTICAL NCB 5.0X44 (Screw) IMPLANT
SCREW NCB 4.0MX34M (Screw) IMPLANT
SCREW NCB 4.0MX44M (Screw) IMPLANT
SCREW NCB 4.0MX50M (Screw) IMPLANT
SCREW NCB 4.0X40MM (Screw) IMPLANT
SPONGE T-LAP 18X18 ~~LOC~~+RFID (SPONGE) ×2 IMPLANT
STAPLER VISISTAT 35W (STAPLE) ×2 IMPLANT
SUT MNCRL AB 3-0 PS2 18 (SUTURE) ×2 IMPLANT
SUT MON AB 2-0 CT1 36 (SUTURE) ×2 IMPLANT
SUT PROLENE 0 CT (SUTURE) IMPLANT
SUT VIC AB 0 CT1 27XBRD ANBCTR (SUTURE) ×2 IMPLANT
SUT VIC AB 1 CT1 27XBRD ANTBC (SUTURE) ×8 IMPLANT
SUT VIC AB 2-0 CT1 TAPERPNT 27 (SUTURE) ×2 IMPLANT
TOWEL GREEN STERILE (TOWEL DISPOSABLE) ×4 IMPLANT
TOWEL GREEN STERILE FF (TOWEL DISPOSABLE) ×2 IMPLANT
TRAY FOLEY MTR SLVR 16FR STAT (SET/KITS/TRAYS/PACK) IMPLANT
WATER STERILE IRR 1000ML POUR (IV SOLUTION) ×6 IMPLANT

## 2023-10-24 NOTE — Progress Notes (Signed)
 Patient removed his Left AC IV cannula, mitten applied on both hands.

## 2023-10-24 NOTE — Transfer of Care (Signed)
 Immediate Anesthesia Transfer of Care Note  Patient: Ernest Campbell  Procedure(s) Performed: OPEN REDUCTION INTERNAL FIXATION FEMORAL SHAFT FRACTURE (Right)  Patient Location: PACU  Anesthesia Type:General  Level of Consciousness: sedated  Airway & Oxygen Therapy: Patient Spontanous Breathing  Post-op Assessment: Report given to RN and Post -op Vital signs reviewed and stable  Post vital signs: Reviewed and stable  Last Vitals:  Vitals Value Taken Time  BP 115/59 10/24/23 1448  Temp    Pulse 75 10/24/23 1451  Resp 16 10/24/23 1451  SpO2 100 % 10/24/23 1451  Vitals shown include unfiled device data.  Last Pain:  Vitals:   10/24/23 0953  TempSrc: Oral  PainSc:          Complications: No notable events documented.

## 2023-10-24 NOTE — ED Notes (Addendum)
 This RN spoke with Tequila [email or web address] as well as Pt's niece Enid Cutter and gave them both an update on Pt's inpatient status and plan for surgery.

## 2023-10-24 NOTE — Progress Notes (Signed)
 Orthopedic Tech Progress Note Patient Details:  [name] 21-Nov-1950 [id number]  Patient ID: Ernest Campbell, male   DOB: 1950/12/14, 73 y.o.   MRN: [id number] I was called to apply a long leg splint. When I arrived and got setup I saw the patient was pulling their leg tight to their body in a curled up position. When I tried to straighten their leg they fought me. After trying for a few minutes I called the dr to explain what was happening and they said if we couldn't straighten their leg it was ok to leave them as is for now.  Trinna Post 10/24/2023, 7:11 AM

## 2023-10-24 NOTE — Anesthesia Postprocedure Evaluation (Signed)
 Anesthesia Post Note  Patient: TAJAE RYBICKI  Procedure(s) Performed: OPEN REDUCTION INTERNAL FIXATION FEMORAL SHAFT FRACTURE (Right)     Patient location during evaluation: PACU Anesthesia Type: General Level of consciousness: awake and alert Pain management: pain level controlled Vital Signs Assessment: post-procedure vital signs reviewed and stable Respiratory status: spontaneous breathing, nonlabored ventilation, respiratory function stable and patient connected to nasal cannula oxygen Cardiovascular status: blood pressure returned to baseline and stable Postop Assessment: no apparent nausea or vomiting Anesthetic complications: no  No notable events documented.  Last Vitals:  Vitals:   10/24/23 1515 10/24/23 1530  BP: 121/64 125/65  Pulse: 92 98  Resp: 14 18  Temp:  36.9 C  SpO2: 100% 100%    Last Pain:  Vitals:   10/24/23 0953  TempSrc: Oral  PainSc:                  Winni Ehrhard L Amoreena Neubert

## 2023-10-24 NOTE — Plan of Care (Signed)

## 2023-10-24 NOTE — H&P (Signed)
 History and Physical    Ernest Campbell DGL:[id number] DOB: 16-Nov-1950 DOA: 10/24/2023  Patient coming from: [facility].  Chief Complaint: Unwitnessed fall.  HPI: Ernest Campbell is a 73 y.o. 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  Patient has cognitive deficits and not able to provide much history.  I did discuss with patient's wife about the patient's arrival.  Patient was recently admitted for right hip fracture and was discharged on 09/30/2023 after patient underwent nailing of the right intertrochanteric femur fracture.  ED Course: In the ER x-rays revealed acute oblique displaced angulated foreshortened fracture of the mid right femur.  On-call orthopedic surgeon was consulted.  CT head and C-spine are pending.  Labs show hemoglobin of 10.6 creatinine 0.8 platelets 294 EKG shows normal sinus rhythm.  Review of Systems: As per HPI, rest all negative.   Past Medical History:  Diagnosis Date   Dementia [facility])     Past Surgical History:  Procedure Laterality Date   INTRAMEDULLARY (IM) NAIL INTERTROCHANTERIC Right 09/26/2023   Procedure: INTRAMEDULLARY (IM) NAIL INTERTROCHANTERIC;  Surgeon: Roby Lofts, MD;  Location: [facility] OR;  Service: Orthopedics;  Laterality: Right;     reports that he has been smoking cigarettes. He does not have any smokeless tobacco history on file. He reports that he does not drink alcohol and does not use drugs.  Allergies  Allergen Reactions   Fentanyl And Related Anaphylaxis   Other Other (See Comments)    According to the patient, all opioids cause hallucinations   Prednisone Other (See Comments)    Hallucinations    Codeine Other (See Comments)    Hallucinations   Morphine Other (See Comments)    Hallucinations   Oxycodone Other (See Comments)    Hallucinations     Family History  Problem Relation Age of Onset   Dementia Mother    Parkinsonism Father    Stroke Father    Breast cancer Sister     Prior to Admission medications   Medication Sig Start Date End Date Taking? Authorizing Provider  acetaminophen (TYLENOL) 160 MG/5ML liquid Take 10 mLs by mouth every 6 (six) hours as needed for pain.   Yes [provider]  ascorbic acid (VITAMIN C) 500 MG tablet Take 500 mg by mouth daily.   Yes [provider]  aspirin 325 MG tablet Take 1 tablet (325 mg total) by mouth daily. 09/29/23 10/29/23 Yes Montez Morita, PA-C  carbidopa-levodopa (SINEMET IR) 25-100 MG tablet Take 1 tablet by mouth 3 (three) times daily with meals. 08/14/23  Yes Dohmeier, Porfirio Mylar, MD  fluticasone (FLONASE) 50 MCG/ACT nasal spray Place 1 spray into both nostrils daily. 10/01/23  Yes Jerald Kief, MD  Ibuprofen 40 MG/ML SUSP Take 5 mLs by mouth 2 (two) times daily.   Yes [provider]  memantine (NAMENDA) 5 MG tablet Take 1 tablet (5 mg total) by mouth 2 (two) times daily. 09/03/23  Yes Dohmeier, Porfirio Mylar, MD  polyethylene glycol (MIRALAX / GLYCOLAX) 17 g packet Take 17 g by mouth daily as needed for mild constipation. 09/30/23  Yes Jerald Kief, MD  QUEtiapine (SEROQUEL) 25 MG tablet Take 1 tablet (25 mg total) by mouth at bedtime. 09/30/23 10/30/23 Yes Jerald Kief, MD  aspirin EC 81 MG tablet Take 1 tablet (81 mg total) by mouth daily.  Swallow whole. 07/22/23   Lanae Boast, MD  chlorhexidine (HIBICLENS) 4 % external liquid Apply 15 mLs (1 Application total) topically as directed for 30 doses. Use as directed daily for 5 days every other week for 6 weeks. Patient not taking: Reported on 10/24/2023 09/26/23   West Bali, PA-C  mupirocin ointment (BACTROBAN) 2 % Place 1 Application into the nose 2 (two) times daily for 60 doses. Use as directed 2 times daily for 5 days every other week for 6 weeks. Patient not taking: Reported on 10/24/2023 09/26/23 10/26/23   West Bali, PA-C    Physical Exam: Constitutional: Moderately built and nourished. Vitals:   10/24/23 0135 10/24/23 0139 10/24/23 0148 10/24/23 0434  BP: (!) 143/67   110/86  Pulse: 87   85  Resp: 15   17  Temp: 97.8 F (36.6 C)   97.9 F (36.6 C)  TempSrc: Axillary     SpO2: 100% 98%  99%  Weight:   58.8 kg    Eyes: Anicteric no pallor. ENMT: No discharge from the ears eyes nose or mouth. Neck: No mass felt.  No neck rigidity. Respiratory: No rhonchi or crepitations. Cardiovascular: S1-S2 heard. Abdomen: Soft nontender bowel sound present. Musculoskeletal: No edema. Skin: No rash. Neurologic: Alert awake.  Communicative. Psychiatric: Alert awake.   Labs on Admission: I have personally reviewed following labs and imaging studies  CBC: Recent Labs  Lab 10/24/23 0301  WBC 11.5*  NEUTROABS 6.9  HGB 10.6*  HCT 32.2*  MCV 100.6*  PLT 294   Basic Metabolic Panel: Recent Labs  Lab 10/24/23 0301  NA 139  K 4.3  CL 107  CO2 23  GLUCOSE 111*  BUN 26*  CREATININE 0.82  CALCIUM 8.6*   GFR: Estimated Creatinine Clearance: 69.7 mL/min (by C-G formula based on SCr of 0.82 mg/dL). Liver Function Tests: Recent Labs  Lab 10/24/23 0301  AST 20  ALT 20  ALKPHOS 133*  BILITOT 0.6  PROT 6.1*  ALBUMIN 3.2*   No results for input(s): "LIPASE", "AMYLASE" in the last 168 hours. No results for input(s): "AMMONIA" in the last 168 hours. Coagulation Profile: Recent Labs  Lab 10/24/23 0301  INR 1.0   Cardiac Enzymes: No results for input(s): "CKTOTAL", "CKMB", "CKMBINDEX", "TROPONINI" in the last 168 hours. BNP (last 3 results) No results for input(s): "PROBNP" in the last 8760 hours. HbA1C: No results for input(s): "HGBA1C" in the last 72 hours. CBG: No results for input(s): "GLUCAP" in the last 168 hours. Lipid Profile: No results for input(s): "CHOL", "HDL", "LDLCALC", "TRIG", "CHOLHDL", "LDLDIRECT" in the last 72 hours. Thyroid Function Tests: No  results for input(s): "TSH", "T4TOTAL", "FREET4", "T3FREE", "THYROIDAB" in the last 72 hours. Anemia Panel: No results for input(s): "VITAMINB12", "FOLATE", "FERRITIN", "TIBC", "IRON", "RETICCTPCT" in the last 72 hours. Urine analysis:    Component Value Date/Time   COLORURINE YELLOW 09/13/2023 0348   APPEARANCEUR HAZY (A) 09/13/2023 0348   LABSPEC 1.024 09/13/2023 0348   PHURINE 5.0 09/13/2023 0348   GLUCOSEU NEGATIVE 09/13/2023 0348   HGBUR NEGATIVE 09/13/2023 0348   BILIRUBINUR NEGATIVE 09/13/2023 0348   KETONESUR NEGATIVE 09/13/2023 0348   PROTEINUR NEGATIVE 09/13/2023 0348   NITRITE NEGATIVE 09/13/2023 0348   LEUKOCYTESUR NEGATIVE 09/13/2023 0348   Sepsis Labs: @LABRCNTIP (procalcitonin:4,lacticidven:4) )No results found for this or any previous visit (from the past 240 hours).   Radiological Exams on Admission: DG Hip Unilat W or Wo Pelvis 2-3 Views Right Result Date: 10/24/2023 CLINICAL DATA:  Found on  floor at home. Nonverbal patient. Deformity in the leg. EXAM: DG HIP (WITH OR WITHOUT PELVIS) 2-3V RIGHT; RIGHT FEMUR 2 VIEWS COMPARISON:  Radiographs 10/06/2023 FINDINGS: Acute oblique fracture of the mid right femur. Apex anterior/lateral angulation. Approximately 10 cm of foreshortening. Intramedullary rod and screw fixation across a healing right intertrochanteric fracture. No radiographic evidence of hardware loosening. The fracture line remains visible. IMPRESSION: Acute oblique displaced, angulated, foreshortened fracture of the mid right femur. Electronically Signed   By: Minerva Fester M.D.   On: 10/24/2023 03:03   DG FEMUR, MIN 2 VIEWS RIGHT Result Date: 10/24/2023 CLINICAL DATA:  Found on floor at home. Nonverbal patient. Deformity in the leg. EXAM: DG HIP (WITH OR WITHOUT PELVIS) 2-3V RIGHT; RIGHT FEMUR 2 VIEWS COMPARISON:  Radiographs 10/06/2023 FINDINGS: Acute oblique fracture of the mid right femur. Apex anterior/lateral angulation. Approximately 10 cm of  foreshortening. Intramedullary rod and screw fixation across a healing right intertrochanteric fracture. No radiographic evidence of hardware loosening. The fracture line remains visible. IMPRESSION: Acute oblique displaced, angulated, foreshortened fracture of the mid right femur. Electronically Signed   By: Minerva Fester M.D.   On: 10/24/2023 03:03    EKG: Independently reviewed.  Normal sinus rhythm.  Assessment/Plan Principal Problem:   Femur fracture (HCC) Active Problems:   TBI (traumatic brain injury) (HCC)   History of short term memory loss   Macrocytic anemia    Right femur fracture - patient had an unwitnessed fall.  CT head and C-spine are pending.  Orthopedic on-call surgeon has been consulted.  Patient's wife has requested that patient not to be given any narcotics for pain management.  Tylenol is okay.  Will keep patient in anticipation of procedure. 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 History of CVA takes antiplatelet agents. Chronic macrocytic anemia check B12 and folate levels.  Patient's wife Ms. Ernest Campbell has requested that patient not to be given any narcotics and also not to communicate about patient to no one else other than her.   Since patient has a fracture of the right femur will need close monitoring further workup and more than 2 midnight stay.   DVT prophylaxis: SCDs. Code Status: Full code as confirmed with patient's wife. Family Communication: Patient's wife. Disposition Plan: Medical floor. Consults called: Orthopedics. Admission status: Inpatient.

## 2023-10-24 NOTE — Op Note (Signed)
 Orthopaedic Surgery Operative Note (CSN: [id number] ) Date of Surgery: 10/24/2023  Admit Date: 10/24/2023   Diagnoses: Pre-Op Diagnoses: Right periprosthetic femoral shaft fracture  Post-Op Diagnosis: Same  Procedures: CPT 27507-Open reduction internal fixation of right femoral shaft fracture  Surgeons : Primary: Roby Lofts, MD  Assistant: Thyra Breed, PA-C  Location: OR 12   Anesthesia: General   Antibiotics: Ancef 2g preop with 1 gm vancomycin powder placed topically   Tourniquet time: None    Estimated Blood Loss: 300 mL  Complications:* No complications entered in OR log *   Specimens:* No specimens in log *   Implants: Implant Name Type Inv. Item Serial No. Manufacturer Lot No. LRB No. Used Action  Cable-Ready cable Grip System Cerclage Cable With Crimp    ZIMMER [id number] Right 1 Implanted  Cable-Ready cable Grip System Cerclage Cable With Crimp    ZIMMER [id number] Right 1 Implanted  CAP LOCK NCB - [id number] Cap CAP LOCK NCB  ZIMMER RECON(ORTH,TRAU,BIO,SG)  Right 5 Implanted  PLATE PROXIMAL FEMUR 12H RT - [id number] Plate PLATE PROXIMAL FEMUR 12H RT  ZIMMER RECON(ORTH,TRAU,BIO,SG)  Right 1 Implanted  SCREW CORT NCB SELFTAP 5.0X42 - [id number] Screw SCREW CORT NCB SELFTAP 5.0X42  ZIMMER RECON(ORTH,TRAU,BIO,SG)  Right 1 Implanted  SCREW CORTICAL NCB 5.0X44 - [id number] Screw SCREW CORTICAL NCB 5.0X44  ZIMMER RECON(ORTH,TRAU,BIO,SG)  Right 1 Implanted  SCREW NCB 4.0MX34M - [id number] Screw SCREW NCB 4.0MX34M  ZIMMER RECON(ORTH,TRAU,BIO,SG)  Right 2 Implanted  SCREW NCB 4.0X40MM - [id number] Screw SCREW NCB 4.0X40MM  ZIMMER RECON(ORTH,TRAU,BIO,SG)  Right 1 Implanted  SCREW NCB 4.5VV61Y - [id number] Screw SCREW NCB 4.4WN46E  ZIMMER RECON(ORTH,TRAU,BIO,SG)  Right 1 Implanted  SCREW NCB 4.0MX50M - [id number] Screw SCREW NCB 4.0MX50M  ZIMMER RECON(ORTH,TRAU,BIO,SG)  Right 1 Implanted     Indications for Surgery: 73 year old male who had a right intertrochanteric femur  fracture that underwent intramedullary nailing in February of this year.  He subsequently fell at his nursing facility sustaining a periprosthetic femoral shaft below the hip nail.  Due to the unstable nature of his injury I recommend proceeding with open reduction internal fixation.  Risks and benefits were discussed with the patient's wife.  Risks included but not limited to bleeding, infection, malunion, nonunion, hardware failure, hardware rotation, nerve and blood vessel injury, periprosthetic fracture below the implant.  Even the possibility anesthetic complications.  She agreed to proceed with surgery and consent was obtained.  Operative Findings: Open reduction internal fixation of right periprosthetic femoral shaft fracture using Zimmer [employer]omet cables for provisional reduction with a proximal 12 hole NCB plate  Procedure: The patient was identified in the preoperative holding area. Consent was confirmed with the patient and their family and all questions were answered. The operative extremity was marked after confirmation with the patient. he was then brought back to the operating room by our anesthesia colleagues.  He was placed under general anesthetic and carefully transferred over to radiolucent flattop table.  A bump was placed under his operative hip.  The right lower extremity was then prepped and draped in usual sterile fashion.  A timeout was performed to verify the patient, the procedure, and the extremity.  Preoperative antibiotics were dosed.  The hip and knee were flexed and fluoroscopic imaging showed the unstable nature of his injury.  A lateral approach to the femur was made and carried down through skin and subcutaneous tissue.  Incised through the IT band and mobilized vastus lateralis anteriorly and left the cuff of the vastus and dissected  down to bone.  I took care to cauterize the posterior perforators.  I then exposed the fracture and cleaned out the hematoma.  I was able to  manipulate the fracture back into anatomic reduction and used reduction tenaculums to hold it in position.  Once I had the reduction anatomic I then used a cable passer to pass 2 cables around the fracture to hold it provisionally.  I was then able to remove the clamps.  I then positioned a 12 hole Zimmer Biomet proximal femoral locking plate at the appropriate position just distal to the lag screws of the hip nail.  I then placed a 3.3 mm drill bit percutaneously distally to align the distal portion of the plate.  I then drilled and placed nonlocking screws proximally around the nail to bring the plate flush to bone.  I then percutaneously placed 5.0 millimeter screws into the distal femoral shaft.  I then removed the 3.3 mm drill bit and placed a 4.0 millimeter screw.  Locking cap was placed on the most proximal 5.0 millimeter screw.  I then returned to the proximal segment and placed 2 more 4.0 millimeter screws bicortically around the nail.  Locking caps were placed on all of the proximal screws.  Final fluoroscopic imaging was obtained.  The incisions were copiously irrigated.  A gram of vancomycin powder was placed to the incisions.  A layered closure of #1 Vicryl, 2-0 Monocryl and 3-0 Monocryl with Dermabond was used to close the skin.  Sterile dressing was applied.  The patient was then awoke from anesthesia and taken to the PACU in stable condition.  Post Op Plan/Instructions: The patient will be weightbearing with a walker.  He will receive postoperative Ancef.  He will be placed back on his aspirin for DVT prophylaxis.  Will have him mobilize with physical and Occupational Therapy.  I was present and performed the entire surgery.  Thyra Breed, PA-C did assist me throughout the case. An assistant was necessary given the difficulty in approach, maintenance of reduction and ability to instrument the fracture.   Truitt Merle, MD [facility]

## 2023-10-24 NOTE — Anesthesia Procedure Notes (Signed)
 Procedure Name: Intubation Date/Time: 10/24/2023 12:53 PM  Performed by: Alease Medina, CRNAPre-anesthesia Checklist: Patient identified, Emergency Drugs available, Suction available and Patient being monitored Patient Re-evaluated:Patient Re-evaluated prior to induction Oxygen Delivery Method: Circle system utilized Preoxygenation: Pre-oxygenation with 100% oxygen Induction Type: IV induction Ventilation: Mask ventilation without difficulty Laryngoscope Size: Mac and 4 Grade View: Grade I Tube type: Oral Tube size: 7.5 mm Number of attempts: 1 Airway Equipment and Method: Stylet and Oral airway Placement Confirmation: ETT inserted through vocal cords under direct vision, positive ETCO2 and breath sounds checked- equal and bilateral Secured at: 22 cm Tube secured with: Tape Dental Injury: Teeth and Oropharynx as per pre-operative assessment

## 2023-10-24 NOTE — ED Notes (Signed)
 Ortho Tech attempted to place long leg splint but Pt refuses to straighten leg. Provider Christian,PA-C made aware and states he will follow up with ortho surgeon.

## 2023-10-24 NOTE — ED Provider Notes (Signed)
 [facility]ERGENC[facility] DEPARTMENT AT [facility] Provider Note   CSN: [id number] Arrival date & time: 10/24/23  0125     History  Chief Complaint  Patient presents with   Ernest Campbell is a 73 y.o. male.  The history is provided by the patient and medical records.  Fall   Level 5: Dementia  73 y.o. M with hx of dementia, prior stroke, prior TBI, presenting to the ED after unwitnessed fall. Patient found on floor of his room at SNF lying on right hip.  Unclear if head injury or LOC.  He complained of pain to right hip/leg en route.  He is on daily ASA, no formal anticoagulation.  Patient just underwent left THA with Dr. Jena Gauss 09/26/23.  Home Medications Prior to Admission medications   Medication Sig Start Date End Date Taking? Authorizing Provider  acetaminophen (TYLENOL) 325 MG tablet Take 2 tablets (650 mg total) by mouth every 6 (six) hours as needed for mild pain (pain score 1-3) or moderate pain (pain score 4-6). 09/28/23   Montez Morita, PA-C  aspirin 325 MG tablet Take 1 tablet (325 mg total) by mouth daily. 09/29/23 10/29/23  Montez Morita, PA-C  aspirin EC 81 MG tablet Take 1 tablet (81 mg total) by mouth daily. Swallow whole. 07/22/23   Lanae Boast, MD  carbidopa-levodopa (SINEMET IR) 25-100 MG tablet Take 1 tablet by mouth 3 (three) times daily with meals. 08/14/23   Dohmeier, Porfirio Mylar, MD  chlorhexidine (HIBICLENS) 4 % external liquid Apply 15 mLs (1 Application total) topically as directed for 30 doses. Use as directed daily for 5 days every other week for 6 weeks. 09/26/23   West Bali, PA-C  fluticasone (FLONASE) 50 MCG/ACT nasal spray Place 1 spray into both nostrils daily. 10/01/23   Jerald Kief, MD  memantine (NAMENDA) 5 MG tablet Take 1 tablet (5 mg total) by mouth 2 (two) times daily. 09/03/23   Dohmeier, Porfirio Mylar, MD  mupirocin ointment (BACTROBAN) 2 % Place 1 Application into the nose 2 (two) times daily for 60 doses. Use as directed 2 times daily  for 5 days every other week for 6 weeks. 09/26/23 10/26/23  West Bali, PA-C  polyethylene glycol (MIRALAX / GLYCOLAX) 17 g packet Take 17 g by mouth daily as needed for mild constipation. 09/30/23   Jerald Kief, MD  QUEtiapine (SEROQUEL) 25 MG tablet Take 1 tablet (25 mg total) by mouth at bedtime. 09/30/23 10/30/23  Jerald Kief, MD      Allergies    Fentanyl and related, Other, Prednisone, Acetaminophen, Codeine, Morphine, and Oxycodone    Review of Systems   Review of Systems  Unable to perform ROS: Dementia    Physical Exam Updated Vital Signs BP (!) 143/67 (BP Location: Right Arm)   Pulse 87   Temp 97.8 F (36.6 C) (Axillary)   Resp 15   Wt 58.8 kg   SpO2 98%   BMI 20.92 kg/m   Physical Exam Vitals and nursing note reviewed.  Constitutional:      Appearance: He is well-developed.     Comments: Lying on ED stretcher on right side, intermittently rolling around  HENT:     Head: Normocephalic and atraumatic.     Comments: No visible head trauma, no open wounds/lacerations Eyes:     Conjunctiva/sclera: Conjunctivae normal.     Pupils: Pupils are equal, round, and reactive to light.  Cardiovascular:     Rate and Rhythm:  Normal rate and regular rhythm.     Heart sounds: Normal heart sounds.  Pulmonary:     Effort: Pulmonary effort is normal.     Breath sounds: Normal breath sounds.  Abdominal:     General: Bowel sounds are normal.     Palpations: Abdomen is soft.  Musculoskeletal:        General: Normal range of motion.     Cervical back: Normal range of motion.     Comments: Very minor skin tear to right dorsal elbow, no pain elicited with ROM Seems to have pain with palpation of proximal right femur, feels swollen with deformity present, unwilling to fully straighten right leg, DP pulse intact  Skin:    General: Skin is warm and dry.  Neurological:     Comments: Awake, alert, able to say hello and state name, fidgeting with covers, no focal deficits  noted     ED Results / Procedures / Treatments   Labs (all labs ordered are listed, but only abnormal results are displayed) Labs Reviewed  CBC WITH DIFFERENTIAL/PLATELET - Abnormal; Notable for the following components:      Result Value   WBC 11.5 (*)    RBC 3.20 (*)    Hemoglobin 10.6 (*)    HCT 32.2 (*)    MCV 100.6 (*)    Monocytes Absolute 1.3 (*)    Abs Immature Granulocytes 0.14 (*)    All other components within normal limits  COMPREHENSIVE METABOLIC PANEL - Abnormal; Notable for the following components:   Glucose, Bld 111 (*)    BUN 26 (*)    Calcium 8.6 (*)    Total Protein 6.1 (*)    Albumin 3.2 (*)    Alkaline Phosphatase 133 (*)    All other components within normal limits  PROTIME-INR  TYPE AND SCREEN    EKG None  Radiology DG Hip Unilat W or Wo Pelvis 2-3 Views Right Result Date: 10/24/2023 CLINICAL DATA:  Found on floor at home. Nonverbal patient. Deformity in the leg. EXAM: DG HIP (WITH OR WITHOUT PELVIS) 2-3V RIGHT; RIGHT FEMUR 2 VIEWS COMPARISON:  Radiographs 10/06/2023 FINDINGS: Acute oblique fracture of the mid right femur. Apex anterior/lateral angulation. Approximately 10 cm of foreshortening. Intramedullary rod and screw fixation across a healing right intertrochanteric fracture. No radiographic evidence of hardware loosening. The fracture line remains visible. IMPRESSION: Acute oblique displaced, angulated, foreshortened fracture of the mid right femur. Electronically Signed   By: Minerva Fester M.D.   On: 10/24/2023 03:03   DG FEMUR, MIN 2 VIEWS RIGHT Result Date: 10/24/2023 CLINICAL DATA:  Found on floor at home. Nonverbal patient. Deformity in the leg. EXAM: DG HIP (WITH OR WITHOUT PELVIS) 2-3V RIGHT; RIGHT FEMUR 2 VIEWS COMPARISON:  Radiographs 10/06/2023 FINDINGS: Acute oblique fracture of the mid right femur. Apex anterior/lateral angulation. Approximately 10 cm of foreshortening. Intramedullary rod and screw fixation across a healing right  intertrochanteric fracture. No radiographic evidence of hardware loosening. The fracture line remains visible. IMPRESSION: Acute oblique displaced, angulated, foreshortened fracture of the mid right femur. Electronically Signed   By: Minerva Fester M.D.   On: 10/24/2023 03:03    Procedures Procedures    Medications Ordered in ED Medications - No data to display  ED Course/ Medical Decision Making/ A&P                                 Medical Decision Making Amount  and/or Complexity of Data Reviewed Labs: ordered. Radiology: ordered and independent interpretation performed. ECG/medicine tests: ordered and independent interpretation performed.  Risk Decision regarding hospitalization.   73 year old male presenting to the ED after unwitnessed fall around midnight at nursing facility.  Found on right side on the floor.  Recent right hip fracture status post repair 09/26/2023 with Dr. Jena Gauss.  He does not have any visible signs of head trauma.  Does have some tenderness and noted deformity to the right proximal to mid thigh.  Will obtain labs and x-rays.  X-ray with midshaft right femur fracture, does have overriding fragments about 10cm.  Labs with mild leukocytosis.  No significant electrolyte derangement.  Type and screen sent.  4:13 AM Spoke with Dr. Carola Frost, we discussed x-ray results.  Discussed options of stabilization including traction versus knee immobilizer given nature of his injury.  As patient is heavily demented and continues moving around on stretcher, recommended knee immobilizer for now.  Team will evaluate in the morning for operative repair.  Will keep NPO.  Spoke with hospitalist, Dr. Toniann Fail-- will admit for ongoing care.  Final Clinical Impression(s) / ED Diagnoses Final diagnoses:  Closed displaced oblique fracture of shaft of right femur, initial encounter [facility])    Rx / DC Orders ED Discharge Orders     None         Garlon Hatchet, PA-C 10/24/23  0604    Glynn Octave, MD 10/24/23 [phone]

## 2023-10-24 NOTE — Interval H&P Note (Signed)
 History and Physical Interval Note:  10/24/2023 11:35 AM  Ernest Campbell  has presented today for surgery, with the diagnosis of Right periprosthetic femur.  The various methods of treatment have been discussed with the patient and family. After consideration of risks, benefits and other options for treatment, the patient has consented to  Procedure(s): OPEN REDUCTION INTERNAL FIXATION FEMORAL SHAFT FRACTURE (Right) as a surgical intervention.  The patient's history has been reviewed, patient examined, no change in status, stable for surgery.  I have reviewed the patient's chart and labs.  Questions were answered to the patient's satisfaction.     Caryn Bee P Ziv Welchel

## 2023-10-24 NOTE — Anesthesia Preprocedure Evaluation (Addendum)
 Anesthesia Evaluation  Patient identified by MRN, date of birth, ID band Patient awake and Patient confused    Reviewed: Allergy & Precautions, NPO status , Patient's Chart, lab work & pertinent test results  Airway Mallampati: II  TM Distance: >3 FB     Dental  (+) Dental Advisory Given, Edentulous Upper, Upper Dentures   Pulmonary Current Smoker and Patient abstained from smoking.   Pulmonary exam normal breath sounds clear to auscultation       Cardiovascular negative cardio ROS Normal cardiovascular exam Rhythm:Regular Rate:Normal  Echo 12/24 Incomplete study, LVEF 55-60%   Neuro/Psych  PSYCHIATRIC DISORDERS     Dementia Shuffling gait Parkinson's disease CVA (intermittently follows commands), Residual Symptoms    GI/Hepatic negative GI ROS, Neg liver ROS,,,  Endo/Other  negative endocrine ROS    Renal/GU negative Renal ROS  negative genitourinary   Musculoskeletal  (+) Arthritis , Osteoarthritis,  Right periprosthetic Femur Fx   Abdominal   Peds  Hematology  (+) Blood dyscrasia, anemia   Anesthesia Other Findings   Reproductive/Obstetrics                             Anesthesia Physical Anesthesia Plan  ASA: 3  Anesthesia Plan: General   Post-op Pain Management: Dilaudid IV   Induction: Intravenous  PONV Risk Score and Plan: 2 and Treatment may vary due to age or medical condition and Ondansetron  Airway Management Planned: Oral ETT  Additional Equipment: None  Intra-op Plan:   Post-operative Plan: Extubation in OR  Informed Consent: I have reviewed the patients History and Physical, chart, labs and discussed the procedure including the risks, benefits and alternatives for the proposed anesthesia with the patient or authorized representative who has indicated his/her understanding and acceptance.     Dental advisory given  Plan Discussed with: CRNA and  Anesthesiologist  Anesthesia Plan Comments:         Anesthesia Quick Evaluation

## 2023-10-24 NOTE — ED Notes (Signed)
 Pt cleaned up and brief changed. Knee immobilizer attempted with RN in the room. Pt would not straighten leg even with pressured applied, knee wouldn't straighten out and looked to be buckling at the femur break. RN notified MD of unsuccessful attempted.

## 2023-10-24 NOTE — Progress Notes (Signed)
 Unable to remove pt's full upper denture. Attempted to remove by his wife and myself but unsuccessful.  Sam CRNA aware.

## 2023-10-24 NOTE — ED Notes (Signed)
 Patient transported to CT

## 2023-10-24 NOTE — ED Triage Notes (Signed)
 Pt BIBA after an unwitnessed fall around 0000 at [facility], Pt found on his right hip on the floor. Recent hx of rt hip fx 91month ago. No blood thinners, unknown if Pt hit his head or not and noted deformity to RLE.

## 2023-10-24 NOTE — Consult Note (Signed)
 Reason for Consult:Right femur fx Referring Physician: Kathlen Mody Time called: 0940 Time at bedside: 0942   Ernest Campbell is an 73 y.o. male.  HPI: Ernest Campbell suffered an unwitnessed fall at the SNF where he resides. He c/o right leg pain and was taken to the ED. X-rays showed a periprosthetic femur fx and orthopedic surgery was consulted. He was transferred to [facility] for definitive care. He is demented and cannot contribute meaningfully to history.  Past Medical History:  Diagnosis Date   Dementia [facility])     Past Surgical History:  Procedure Laterality Date   INTRAMEDULLARY (IM) NAIL INTERTROCHANTERIC Right 09/26/2023   Procedure: INTRAMEDULLARY (IM) NAIL INTERTROCHANTERIC;  Surgeon: Roby Lofts, MD;  Location: [facility] OR;  Service: Orthopedics;  Laterality: Right;    Family History  Problem Relation Age of Onset   Dementia Mother    Parkinsonism Father    Stroke Father    Breast cancer Sister     Social History:  reports that he has been smoking cigarettes. He does not have any smokeless tobacco history on file. He reports that he does not drink alcohol and does not use drugs.  Allergies:  Allergies  Allergen Reactions   Fentanyl And Related Anaphylaxis   Other Other (See Comments)    According to the patient, all opioids cause hallucinations   Prednisone Other (See Comments)    Hallucinations    Codeine Other (See Comments)    Hallucinations   Morphine Other (See Comments)    Hallucinations   Oxycodone Other (See Comments)    Hallucinations    Medications: I have reviewed the patient's current medications.  Results for orders placed or performed during the hospital encounter of 10/24/23 (from the past 48 hours)  CBC with Differential     Status: Abnormal   Collection Time: 10/24/23  3:01 AM  Result Value Ref Range   WBC 11.5 (H) 4.0 - 10.5 K/uL   RBC 3.20 (L) 4.22 - 5.81 MIL/uL   Hemoglobin 10.6 (L) 13.0 - 17.0 g/dL   HCT 91.4 (L) 78.2 - 95.6 %   MCV 100.6 (H)  80.0 - 100.0 fL   MCH 33.1 26.0 - 34.0 pg   MCHC 32.9 30.0 - 36.0 g/dL   RDW 21.3 08.6 - 57.8 %   Platelets 294 150 - 400 K/uL   nRBC 0.0 0.0 - 0.2 %   Neutrophils Relative % 60 %   Neutro Abs 6.9 1.7 - 7.7 K/uL   Lymphocytes Relative 26 %   Lymphs Abs 3.0 0.7 - 4.0 K/uL   Monocytes Relative 11 %   Monocytes Absolute 1.3 (H) 0.1 - 1.0 K/uL   Eosinophils Relative 1 %   Eosinophils Absolute 0.1 0.0 - 0.5 K/uL   Basophils Relative 1 %   Basophils Absolute 0.1 0.0 - 0.1 K/uL   Immature Granulocytes 1 %   Abs Immature Granulocytes 0.14 (H) 0.00 - 0.07 K/uL    Comment: Performed at [facility], [address] [place], [place] [address]  Comprehensive metabolic panel     Status: Abnormal   Collection Time: 10/24/23  3:01 AM  Result Value Ref Range   Sodium 139 135 - 145 mmol/L   Potassium 4.3 3.5 - 5.1 mmol/L   Chloride 107 98 - 111 mmol/L   CO2 23 22 - 32 mmol/L   Glucose, Bld 111 (H) 70 - 99 mg/dL    Comment: Glucose reference range applies only to samples taken after fasting for at  least 8 hours.   BUN 26 (H) 8 - 23 mg/dL   Creatinine, Ser 9.14 0.61 - 1.24 mg/dL   Calcium 8.6 (L) 8.9 - 10.3 mg/dL   Total Protein 6.1 (L) 6.5 - 8.1 g/dL   Albumin 3.2 (L) 3.5 - 5.0 g/dL   AST 20 15 - 41 U/L   ALT 20 0 - 44 U/L   Alkaline Phosphatase 133 (H) 38 - 126 U/L   Total Bilirubin 0.6 0.0 - 1.2 mg/dL   GFR, Estimated >78 >29 mL/min    Comment: (NOTE) Calculated using the CKD-EPI Creatinine Equation (2021)    Anion gap 9 5 - 15    Comment: Performed at [facility], [address] [place], [place] [address]  Protime-INR     Status: None   Collection Time: 10/24/23  3:01 AM  Result Value Ref Range   Prothrombin Time 13.5 11.4 - 15.2 seconds   INR 1.0 0.8 - 1.2    Comment: (NOTE) INR goal varies based on device and disease states. Performed at [facility], [address] [place], [place] [address]   Type and screen      Status: None   Collection Time: 10/24/23  3:01 AM  Result Value Ref Range   ABO/RH(D) O POS    Antibody Screen NEG    Sample Expiration      10/27/2023,2359 Performed at [facility], [address], [place], [place] [address]   CBG monitoring, ED     Status: None   Collection Time: 10/24/23  9:00 AM  Result Value Ref Range   Glucose-Capillary 97 70 - 99 mg/dL    Comment: Glucose reference range applies only to samples taken after fasting for at least 8 hours.    CT Head Wo Contrast Result Date: 10/24/2023 CLINICAL DATA:  Unwitnessed fall, found on floor at his skilled nursing facility, Unknown head trauma, probable neck trauma. EXAM: CT HEAD WITHOUT CONTRAST CT CERVICAL SPINE WITHOUT CONTRAST TECHNIQUE: Multidetector CT imaging of the head and cervical spine was performed following the standard protocol without intravenous contrast. Multiplanar CT image reconstructions of the cervical spine were also generated. RADIATION DOSE REDUCTION: This exam was performed according to the departmental dose-optimization program which includes automated exposure control, adjustment of the mA and/or kV according to patient size and/or use of iterative reconstruction technique. COMPARISON:  Cervical spine CT 10/06/2023, CT head 10/06/2023 FINDINGS: CT HEAD FINDINGS Brain: There is mild-to-moderate cerebral atrophy, small vessel disease and atrophic ventriculomegaly, without midline shift. No cortical based acute infarct, hemorrhage or mass effect is seen. Basal cisterns are clear. Vascular: No hyperdense vessel or unexpected calcification. Skull: Negative for fractures or focal lesions. Sinuses/Orbits: Chronic opacification right maxillary sinus. Other sinuses and mastoid air cells are clear. Negative visualized orbits. There has been a previous right inferior nasal turbinectomy. Other: None. CT CERVICAL SPINE FINDINGS Alignment: Minimal grade 1 degenerative anterolisthesis again is noted at  C4-5, C7-T1 and T1-2. No traumatic or further listhesis is seen with narrowing and osteophytes of the anterior atlantodental jointagain noted. Skull base and vertebrae: No acute fracture. No primary bone lesion or focal pathologic process. Mild osteopenia. Soft tissues and spinal canal: No prevertebral fluid or swelling. No visible canal hematoma. Disc levels: There is mild disc space loss at C4-5 and C7-T1, more advanced disc space loss with bidirectional endplate osteophytes at C5-6 and C6-7. The other discs are normal in heights. There are no herniated discs or cord compromise. There  is multilevel left greater than right facet joint hypertrophy, and uncinate spurring. Acquired foraminal stenosis is mild on the left at C3-4, severe on the left C4-5, bilaterally moderate C5-6, moderate to severe on the right and moderate on the left C6-7. The other foramina are clear. Upper chest: There are mild paraseptal emphysematous changes in both lung apices. Otherwise negative. Other: None. IMPRESSION: 1. No acute intracranial CT findings or depressed skull fractures. 2. Chronic changes. 3. Osteopenia and degenerative change without evidence of cervical fractures. 4. Minimal grade 1 degenerative anterolisthesis C4-5, C7-T1 and T1-2. 5. Emphysema. Electronically Signed   By: Almira Bar M.D.   On: 10/24/2023 07:44   CT Cervical Spine Wo Contrast Result Date: 10/24/2023 CLINICAL DATA:  Unwitnessed fall, found on floor at his skilled nursing facility, Unknown head trauma, probable neck trauma. EXAM: CT HEAD WITHOUT CONTRAST CT CERVICAL SPINE WITHOUT CONTRAST TECHNIQUE: Multidetector CT imaging of the head and cervical spine was performed following the standard protocol without intravenous contrast. Multiplanar CT image reconstructions of the cervical spine were also generated. RADIATION DOSE REDUCTION: This exam was performed according to the departmental dose-optimization program which includes automated exposure  control, adjustment of the mA and/or kV according to patient size and/or use of iterative reconstruction technique. COMPARISON:  Cervical spine CT 10/06/2023, CT head 10/06/2023 FINDINGS: CT HEAD FINDINGS Brain: There is mild-to-moderate cerebral atrophy, small vessel disease and atrophic ventriculomegaly, without midline shift. No cortical based acute infarct, hemorrhage or mass effect is seen. Basal cisterns are clear. Vascular: No hyperdense vessel or unexpected calcification. Skull: Negative for fractures or focal lesions. Sinuses/Orbits: Chronic opacification right maxillary sinus. Other sinuses and mastoid air cells are clear. Negative visualized orbits. There has been a previous right inferior nasal turbinectomy. Other: None. CT CERVICAL SPINE FINDINGS Alignment: Minimal grade 1 degenerative anterolisthesis again is noted at C4-5, C7-T1 and T1-2. No traumatic or further listhesis is seen with narrowing and osteophytes of the anterior atlantodental jointagain noted. Skull base and vertebrae: No acute fracture. No primary bone lesion or focal pathologic process. Mild osteopenia. Soft tissues and spinal canal: No prevertebral fluid or swelling. No visible canal hematoma. Disc levels: There is mild disc space loss at C4-5 and C7-T1, more advanced disc space loss with bidirectional endplate osteophytes at C5-6 and C6-7. The other discs are normal in heights. There are no herniated discs or cord compromise. There is multilevel left greater than right facet joint hypertrophy, and uncinate spurring. Acquired foraminal stenosis is mild on the left at C3-4, severe on the left C4-5, bilaterally moderate C5-6, moderate to severe on the right and moderate on the left C6-7. The other foramina are clear. Upper chest: There are mild paraseptal emphysematous changes in both lung apices. Otherwise negative. Other: None. IMPRESSION: 1. No acute intracranial CT findings or depressed skull fractures. 2. Chronic changes. 3.  Osteopenia and degenerative change without evidence of cervical fractures. 4. Minimal grade 1 degenerative anterolisthesis C4-5, C7-T1 and T1-2. 5. Emphysema. Electronically Signed   By: Almira Bar M.D.   On: 10/24/2023 07:44   DG Hip Unilat W or Wo Pelvis 2-3 Views Right Result Date: 10/24/2023 CLINICAL DATA:  Found on floor at home. Nonverbal patient. Deformity in the leg. EXAM: DG HIP (WITH OR WITHOUT PELVIS) 2-3V RIGHT; RIGHT FEMUR 2 VIEWS COMPARISON:  Radiographs 10/06/2023 FINDINGS: Acute oblique fracture of the mid right femur. Apex anterior/lateral angulation. Approximately 10 cm of foreshortening. Intramedullary rod and screw fixation across a healing right intertrochanteric fracture. No radiographic evidence  of hardware loosening. The fracture line remains visible. IMPRESSION: Acute oblique displaced, angulated, foreshortened fracture of the mid right femur. Electronically Signed   By: Minerva Fester M.D.   On: 10/24/2023 03:03   DG FEMUR, MIN 2 VIEWS RIGHT Result Date: 10/24/2023 CLINICAL DATA:  Found on floor at home. Nonverbal patient. Deformity in the leg. EXAM: DG HIP (WITH OR WITHOUT PELVIS) 2-3V RIGHT; RIGHT FEMUR 2 VIEWS COMPARISON:  Radiographs 10/06/2023 FINDINGS: Acute oblique fracture of the mid right femur. Apex anterior/lateral angulation. Approximately 10 cm of foreshortening. Intramedullary rod and screw fixation across a healing right intertrochanteric fracture. No radiographic evidence of hardware loosening. The fracture line remains visible. IMPRESSION: Acute oblique displaced, angulated, foreshortened fracture of the mid right femur. Electronically Signed   By: Minerva Fester M.D.   On: 10/24/2023 03:03    Review of Systems  Unable to perform ROS: Dementia   Blood pressure (!) 110/59, pulse 81, temperature 98 F (36.7 C), resp. rate (!) 25, weight 58.8 kg, SpO2 100%. Physical Exam Constitutional:      General: He is not in acute distress.    Appearance: He is  well-developed. He is not diaphoretic.  HENT:     Head: Normocephalic and atraumatic.  Eyes:     General: No scleral icterus.       Right eye: No discharge.        Left eye: No discharge.     Conjunctiva/sclera: Conjunctivae normal.  Cardiovascular:     Rate and Rhythm: Normal rate and regular rhythm.  Pulmonary:     Effort: Pulmonary effort is normal. No respiratory distress.  Musculoskeletal:     Cervical back: Normal range of motion.     Comments: RLE No traumatic wounds, ecchymosis, or rash  Mod TTP thigh  No knee or ankle effusion  Knee stable to varus/ valgus and anterior/posterior stress  Sens DPN, SPN, TN intact  Motor EHL, ext, flex, evers 5/5  DP 1+, PT 1+, No significant edema  Skin:    General: Skin is warm and dry.  Neurological:     Mental Status: He is alert.  Psychiatric:        Mood and Affect: Mood normal.        Behavior: Behavior normal.        Cognition and Memory: Cognition is impaired.     Assessment/Plan: Right periprosthetic femur fx -- Plan ORIF today with Dr. Jena Gauss. Please keep NPO. 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

## 2023-10-24 NOTE — H&P (View-Only) (Signed)
 Reason for Consult:Right femur fx Referring Physician: Kathlen Mody Time called: 0940 Time at bedside: 0942   Ernest Campbell is an 73 y.o. male.  HPI: Ernest Campbell suffered an unwitnessed fall at the SNF where he resides. He c/o right leg pain and was taken to the ED. X-rays showed a periprosthetic femur fx and orthopedic surgery was consulted. He was transferred to [facility] for definitive care. He is demented and cannot contribute meaningfully to history.  Past Medical History:  Diagnosis Date   Dementia [facility])     Past Surgical History:  Procedure Laterality Date   INTRAMEDULLARY (IM) NAIL INTERTROCHANTERIC Right 09/26/2023   Procedure: INTRAMEDULLARY (IM) NAIL INTERTROCHANTERIC;  Surgeon: Roby Lofts, MD;  Location: [facility] OR;  Service: Orthopedics;  Laterality: Right;    Family History  Problem Relation Age of Onset   Dementia Mother    Parkinsonism Father    Stroke Father    Breast cancer Sister     Social History:  reports that he has been smoking cigarettes. He does not have any smokeless tobacco history on file. He reports that he does not drink alcohol and does not use drugs.  Allergies:  Allergies  Allergen Reactions   Fentanyl And Related Anaphylaxis   Other Other (See Comments)    According to the patient, all opioids cause hallucinations   Prednisone Other (See Comments)    Hallucinations    Codeine Other (See Comments)    Hallucinations   Morphine Other (See Comments)    Hallucinations   Oxycodone Other (See Comments)    Hallucinations    Medications: I have reviewed the patient's current medications.  Results for orders placed or performed during the hospital encounter of 10/24/23 (from the past 48 hours)  CBC with Differential     Status: Abnormal   Collection Time: 10/24/23  3:01 AM  Result Value Ref Range   WBC 11.5 (H) 4.0 - 10.5 K/uL   RBC 3.20 (L) 4.22 - 5.81 MIL/uL   Hemoglobin 10.6 (L) 13.0 - 17.0 g/dL   HCT 91.4 (L) 78.2 - 95.6 %   MCV 100.6 (H)  80.0 - 100.0 fL   MCH 33.1 26.0 - 34.0 pg   MCHC 32.9 30.0 - 36.0 g/dL   RDW 21.3 08.6 - 57.8 %   Platelets 294 150 - 400 K/uL   nRBC 0.0 0.0 - 0.2 %   Neutrophils Relative % 60 %   Neutro Abs 6.9 1.7 - 7.7 K/uL   Lymphocytes Relative 26 %   Lymphs Abs 3.0 0.7 - 4.0 K/uL   Monocytes Relative 11 %   Monocytes Absolute 1.3 (H) 0.1 - 1.0 K/uL   Eosinophils Relative 1 %   Eosinophils Absolute 0.1 0.0 - 0.5 K/uL   Basophils Relative 1 %   Basophils Absolute 0.1 0.0 - 0.1 K/uL   Immature Granulocytes 1 %   Abs Immature Granulocytes 0.14 (H) 0.00 - 0.07 K/uL    Comment: Performed at [facility], [address] [place], [place] [address]  Comprehensive metabolic panel     Status: Abnormal   Collection Time: 10/24/23  3:01 AM  Result Value Ref Range   Sodium 139 135 - 145 mmol/L   Potassium 4.3 3.5 - 5.1 mmol/L   Chloride 107 98 - 111 mmol/L   CO2 23 22 - 32 mmol/L   Glucose, Bld 111 (H) 70 - 99 mg/dL    Comment: Glucose reference range applies only to samples taken after fasting for at  least 8 hours.   BUN 26 (H) 8 - 23 mg/dL   Creatinine, Ser 9.14 0.61 - 1.24 mg/dL   Calcium 8.6 (L) 8.9 - 10.3 mg/dL   Total Protein 6.1 (L) 6.5 - 8.1 g/dL   Albumin 3.2 (L) 3.5 - 5.0 g/dL   AST 20 15 - 41 U/L   ALT 20 0 - 44 U/L   Alkaline Phosphatase 133 (H) 38 - 126 U/L   Total Bilirubin 0.6 0.0 - 1.2 mg/dL   GFR, Estimated >78 >29 mL/min    Comment: (NOTE) Calculated using the CKD-EPI Creatinine Equation (2021)    Anion gap 9 5 - 15    Comment: Performed at [facility], [address] [place], [place] [address]  Protime-INR     Status: None   Collection Time: 10/24/23  3:01 AM  Result Value Ref Range   Prothrombin Time 13.5 11.4 - 15.2 seconds   INR 1.0 0.8 - 1.2    Comment: (NOTE) INR goal varies based on device and disease states. Performed at [facility], [address] [place], [place] [address]   Type and screen      Status: None   Collection Time: 10/24/23  3:01 AM  Result Value Ref Range   ABO/RH(D) O POS    Antibody Screen NEG    Sample Expiration      10/27/2023,2359 Performed at [facility], [address], [place], [place] [address]   CBG monitoring, ED     Status: None   Collection Time: 10/24/23  9:00 AM  Result Value Ref Range   Glucose-Capillary 97 70 - 99 mg/dL    Comment: Glucose reference range applies only to samples taken after fasting for at least 8 hours.    CT Head Wo Contrast Result Date: 10/24/2023 CLINICAL DATA:  Unwitnessed fall, found on floor at his skilled nursing facility, Unknown head trauma, probable neck trauma. EXAM: CT HEAD WITHOUT CONTRAST CT CERVICAL SPINE WITHOUT CONTRAST TECHNIQUE: Multidetector CT imaging of the head and cervical spine was performed following the standard protocol without intravenous contrast. Multiplanar CT image reconstructions of the cervical spine were also generated. RADIATION DOSE REDUCTION: This exam was performed according to the departmental dose-optimization program which includes automated exposure control, adjustment of the mA and/or kV according to patient size and/or use of iterative reconstruction technique. COMPARISON:  Cervical spine CT 10/06/2023, CT head 10/06/2023 FINDINGS: CT HEAD FINDINGS Brain: There is mild-to-moderate cerebral atrophy, small vessel disease and atrophic ventriculomegaly, without midline shift. No cortical based acute infarct, hemorrhage or mass effect is seen. Basal cisterns are clear. Vascular: No hyperdense vessel or unexpected calcification. Skull: Negative for fractures or focal lesions. Sinuses/Orbits: Chronic opacification right maxillary sinus. Other sinuses and mastoid air cells are clear. Negative visualized orbits. There has been a previous right inferior nasal turbinectomy. Other: None. CT CERVICAL SPINE FINDINGS Alignment: Minimal grade 1 degenerative anterolisthesis again is noted at  C4-5, C7-T1 and T1-2. No traumatic or further listhesis is seen with narrowing and osteophytes of the anterior atlantodental jointagain noted. Skull base and vertebrae: No acute fracture. No primary bone lesion or focal pathologic process. Mild osteopenia. Soft tissues and spinal canal: No prevertebral fluid or swelling. No visible canal hematoma. Disc levels: There is mild disc space loss at C4-5 and C7-T1, more advanced disc space loss with bidirectional endplate osteophytes at C5-6 and C6-7. The other discs are normal in heights. There are no herniated discs or cord compromise. There  is multilevel left greater than right facet joint hypertrophy, and uncinate spurring. Acquired foraminal stenosis is mild on the left at C3-4, severe on the left C4-5, bilaterally moderate C5-6, moderate to severe on the right and moderate on the left C6-7. The other foramina are clear. Upper chest: There are mild paraseptal emphysematous changes in both lung apices. Otherwise negative. Other: None. IMPRESSION: 1. No acute intracranial CT findings or depressed skull fractures. 2. Chronic changes. 3. Osteopenia and degenerative change without evidence of cervical fractures. 4. Minimal grade 1 degenerative anterolisthesis C4-5, C7-T1 and T1-2. 5. Emphysema. Electronically Signed   By: Almira Bar M.D.   On: 10/24/2023 07:44   CT Cervical Spine Wo Contrast Result Date: 10/24/2023 CLINICAL DATA:  Unwitnessed fall, found on floor at his skilled nursing facility, Unknown head trauma, probable neck trauma. EXAM: CT HEAD WITHOUT CONTRAST CT CERVICAL SPINE WITHOUT CONTRAST TECHNIQUE: Multidetector CT imaging of the head and cervical spine was performed following the standard protocol without intravenous contrast. Multiplanar CT image reconstructions of the cervical spine were also generated. RADIATION DOSE REDUCTION: This exam was performed according to the departmental dose-optimization program which includes automated exposure  control, adjustment of the mA and/or kV according to patient size and/or use of iterative reconstruction technique. COMPARISON:  Cervical spine CT 10/06/2023, CT head 10/06/2023 FINDINGS: CT HEAD FINDINGS Brain: There is mild-to-moderate cerebral atrophy, small vessel disease and atrophic ventriculomegaly, without midline shift. No cortical based acute infarct, hemorrhage or mass effect is seen. Basal cisterns are clear. Vascular: No hyperdense vessel or unexpected calcification. Skull: Negative for fractures or focal lesions. Sinuses/Orbits: Chronic opacification right maxillary sinus. Other sinuses and mastoid air cells are clear. Negative visualized orbits. There has been a previous right inferior nasal turbinectomy. Other: None. CT CERVICAL SPINE FINDINGS Alignment: Minimal grade 1 degenerative anterolisthesis again is noted at C4-5, C7-T1 and T1-2. No traumatic or further listhesis is seen with narrowing and osteophytes of the anterior atlantodental jointagain noted. Skull base and vertebrae: No acute fracture. No primary bone lesion or focal pathologic process. Mild osteopenia. Soft tissues and spinal canal: No prevertebral fluid or swelling. No visible canal hematoma. Disc levels: There is mild disc space loss at C4-5 and C7-T1, more advanced disc space loss with bidirectional endplate osteophytes at C5-6 and C6-7. The other discs are normal in heights. There are no herniated discs or cord compromise. There is multilevel left greater than right facet joint hypertrophy, and uncinate spurring. Acquired foraminal stenosis is mild on the left at C3-4, severe on the left C4-5, bilaterally moderate C5-6, moderate to severe on the right and moderate on the left C6-7. The other foramina are clear. Upper chest: There are mild paraseptal emphysematous changes in both lung apices. Otherwise negative. Other: None. IMPRESSION: 1. No acute intracranial CT findings or depressed skull fractures. 2. Chronic changes. 3.  Osteopenia and degenerative change without evidence of cervical fractures. 4. Minimal grade 1 degenerative anterolisthesis C4-5, C7-T1 and T1-2. 5. Emphysema. Electronically Signed   By: Almira Bar M.D.   On: 10/24/2023 07:44   DG Hip Unilat W or Wo Pelvis 2-3 Views Right Result Date: 10/24/2023 CLINICAL DATA:  Found on floor at home. Nonverbal patient. Deformity in the leg. EXAM: DG HIP (WITH OR WITHOUT PELVIS) 2-3V RIGHT; RIGHT FEMUR 2 VIEWS COMPARISON:  Radiographs 10/06/2023 FINDINGS: Acute oblique fracture of the mid right femur. Apex anterior/lateral angulation. Approximately 10 cm of foreshortening. Intramedullary rod and screw fixation across a healing right intertrochanteric fracture. No radiographic evidence  of hardware loosening. The fracture line remains visible. IMPRESSION: Acute oblique displaced, angulated, foreshortened fracture of the mid right femur. Electronically Signed   By: Minerva Fester M.D.   On: 10/24/2023 03:03   DG FEMUR, MIN 2 VIEWS RIGHT Result Date: 10/24/2023 CLINICAL DATA:  Found on floor at home. Nonverbal patient. Deformity in the leg. EXAM: DG HIP (WITH OR WITHOUT PELVIS) 2-3V RIGHT; RIGHT FEMUR 2 VIEWS COMPARISON:  Radiographs 10/06/2023 FINDINGS: Acute oblique fracture of the mid right femur. Apex anterior/lateral angulation. Approximately 10 cm of foreshortening. Intramedullary rod and screw fixation across a healing right intertrochanteric fracture. No radiographic evidence of hardware loosening. The fracture line remains visible. IMPRESSION: Acute oblique displaced, angulated, foreshortened fracture of the mid right femur. Electronically Signed   By: Minerva Fester M.D.   On: 10/24/2023 03:03    Review of Systems  Unable to perform ROS: Dementia   Blood pressure (!) 110/59, pulse 81, temperature 98 F (36.7 C), resp. rate (!) 25, weight 58.8 kg, SpO2 100%. Physical Exam Constitutional:      General: He is not in acute distress.    Appearance: He is  well-developed. He is not diaphoretic.  HENT:     Head: Normocephalic and atraumatic.  Eyes:     General: No scleral icterus.       Right eye: No discharge.        Left eye: No discharge.     Conjunctiva/sclera: Conjunctivae normal.  Cardiovascular:     Rate and Rhythm: Normal rate and regular rhythm.  Pulmonary:     Effort: Pulmonary effort is normal. No respiratory distress.  Musculoskeletal:     Cervical back: Normal range of motion.     Comments: RLE No traumatic wounds, ecchymosis, or rash  Mod TTP thigh  No knee or ankle effusion  Knee stable to varus/ valgus and anterior/posterior stress  Sens DPN, SPN, TN intact  Motor EHL, ext, flex, evers 5/5  DP 1+, PT 1+, No significant edema  Skin:    General: Skin is warm and dry.  Neurological:     Mental Status: He is alert.  Psychiatric:        Mood and Affect: Mood normal.        Behavior: Behavior normal.        Cognition and Memory: Cognition is impaired.     Assessment/Plan: Right periprosthetic femur fx -- Plan ORIF today with Dr. Jena Gauss. Please keep NPO. 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

## 2023-10-24 NOTE — Progress Notes (Signed)
 Patient admitted earlier this am by Dr Toniann Fail. Please see detailed H&P   Ernest Campbell is a 73 y.o. 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   In the ER x-rays revealed acute oblique displaced angulated foreshortened fracture of the mid right femur. On-call orthopedic surgeon was consulted.    Plan for surgical repair of the right femur later today. Continue to monitor.    Kathlen Mody, MD

## 2023-10-25 ENCOUNTER — Encounter (HOSPITAL_COMMUNITY): Payer: Self-pay | Admitting: Student

## 2023-10-25 DIAGNOSIS — S7291XA Unspecified fracture of right femur, initial encounter for closed fracture: Secondary | ICD-10-CM | POA: Diagnosis not present

## 2023-10-25 LAB — BASIC METABOLIC PANEL
Anion gap: 10 (ref 5–15)
BUN: 14 mg/dL (ref 8–23)
CO2: 23 mmol/L (ref 22–32)
Calcium: 7.9 mg/dL — ABNORMAL LOW (ref 8.9–10.3)
Chloride: 102 mmol/L (ref 98–111)
Creatinine, Ser: 0.83 mg/dL (ref 0.61–1.24)
GFR, Estimated: >60 mL/min (ref >60)
Glucose, Bld: 121 mg/dL — ABNORMAL HIGH (ref 70–99)
Potassium: 4.2 mmol/L (ref 3.5–5.1)
Sodium: 135 mmol/L (ref 135–145)

## 2023-10-25 LAB — CBC
HCT: 20.7 % — ABNORMAL LOW (ref 39.0–52.0)
HCT: 23.3 % — ABNORMAL LOW (ref 39.0–52.0)
Hemoglobin: 7 g/dL — ABNORMAL LOW (ref 13.0–17.0)
Hemoglobin: 8 g/dL — ABNORMAL LOW (ref 13.0–17.0)
MCH: 32.7 pg (ref 26.0–34.0)
MCH: 32.7 pg (ref 26.0–34.0)
MCHC: 33.8 g/dL (ref 30.0–36.0)
MCHC: 34.3 g/dL (ref 30.0–36.0)
MCV: 96.7 fL (ref 80.0–100.0)
MCV: 95.1 fL (ref 80.0–100.0)
Platelets: 235 10*3/uL (ref 150–400)
Platelets: 180 10*3/uL (ref 150–400)
RBC: 2.14 MIL/uL — ABNORMAL LOW (ref 4.22–5.81)
RBC: 2.45 MIL/uL — ABNORMAL LOW (ref 4.22–5.81)
RDW: 13.3 % (ref 11.5–15.5)
RDW: 14.1 % (ref 11.5–15.5)
WBC: 7.9 10*3/uL (ref 4.0–10.5)
WBC: 5 10*3/uL (ref 4.0–10.5)
nRBC: 0 % (ref 0.0–0.2)
nRBC: 0 % (ref 0.0–0.2)

## 2023-10-25 LAB — GLUCOSE, CAPILLARY
Glucose-Capillary: 110 mg/dL — ABNORMAL HIGH (ref 70–99)
Glucose-Capillary: 117 mg/dL — ABNORMAL HIGH (ref 70–99)

## 2023-10-25 LAB — PREPARE RBC (CROSSMATCH)

## 2023-10-25 MED ORDER — ADULT MULTIVITAMIN W/MINERALS CH
1.0000 | ORAL_TABLET | Freq: Every day | ORAL | Status: DC
  Administered 2023-10-26 – 2023-10-29 (×4): 1 via ORAL
  Filled 2023-10-25 (×4): qty 1

## 2023-10-25 MED ORDER — SODIUM CHLORIDE 0.9% IV SOLUTION: Freq: Once | INTRAVENOUS | Status: AC

## 2023-10-25 MED ORDER — ENSURE ENLIVE PO LIQD
237.0000 mL | Freq: Two times a day (BID) | ORAL | Status: DC
  Administered 2023-10-25 – 2023-10-29 (×6): 237 mL via ORAL

## 2023-10-25 MED ORDER — SODIUM CHLORIDE 0.9 % IV BOLUS
250.0000 mL | Freq: Once | INTRAVENOUS | Status: AC
  Administered 2023-10-25: 250 mL via INTRAVENOUS

## 2023-10-25 NOTE — Progress Notes (Signed)
 Transition of Care [facility]) - CAGE-AID Screening   Patient Details  Name: Ernest Campbell MRN: [id number] Date of Birth: 30-Nov-1950  Hewitt Shorts, RN Trauma Response Nurse Phone Number: [phone] 10/25/2023, 10:09 AM      CAGE-AID Screening: Substance Abuse Screening unable to be completed due to: : (S) Patient unable to participate (hx of dementia from a facility- [facility])             Substance Abuse Education Offered: (S) No

## 2023-10-25 NOTE — Plan of Care (Signed)
  Problem: Clinical Measurements: Goal: Will remain free from infection Outcome: Not Progressing   Problem: Activity: Goal: Risk for activity intolerance will decrease Outcome: Not Progressing   Problem: Coping: Goal: Level of anxiety will decrease Outcome: Not Progressing   Problem: Safety: Goal: Ability to remain free from injury will improve Outcome: Not Progressing

## 2023-10-25 NOTE — TOC Initial Note (Signed)
 Transition of Care [facility]) - Initial/Assessment Note    Patient Details  Name: [name] MRN: [id number] Date of Birth: [birth year]  Transition of Care [facility]) CM/SW Contact:    Ernest Frederick, LCSW Phone Number: 10/25/2023, 3:39 PM  Clinical Narrative:   Pt oriented x1, CSW spoke with wife Ernest Campbell by phone, later spoke to her in room.  Pt from home with wife but has been at [facility] several different times and has now had 2 falls leading to new injuries at [facility].  Wife does want to pursue more STR but asking for new facility, would  be interested in [facility].  Discussed copay days and wife reports these are not a problem.    CSW spoke with Ernest Campbell/[facility].  She is aware pt will not return, estimates 37 days of STR used.  Referral sent out in hub for SNF, CSW reached out to Ernest Campbell/[facility] to review.                Expected Discharge Plan: Skilled Nursing Facility Barriers to Discharge: Continued Medical Work up, SNF Pending bed offer   Patient Goals and CMS Choice   CMS Medicare.gov Compare Post Acute Care list provided to:: Patient Represenative (must comment) (wife Ernest Campbell) Choice offered to / list presented to : Spouse      Expected Discharge Plan and Services In-house Referral: Clinical Social Work   Post Acute Care Choice: Skilled Nursing Facility Living arrangements for the past 2 months: Skilled Nursing Facility (has been at [facility] several times, normally from home)                                      Prior Living Arrangements/Services Living arrangements for the past 2 months: Skilled Holiday representative (has been at [facility] several times, normally from home) Lives with:: Spouse Patient language and need for interpreter reviewed:: No        Need for Family Participation in Patient Care: Yes (Comment) Care giver support system in place?: Yes (comment) Current home services: Other (comment) (none) Criminal Activity/Legal  Involvement Pertinent to Current Situation/Hospitalization: No - Comment as needed  Activities of Daily Living   ADL Screening (condition at time of admission) Independently performs ADLs?: No Does the patient have a NEW difficulty with bathing/dressing/toileting/self-feeding that is expected to last >3 days?: No Does the patient have a NEW difficulty with getting in/out of bed, walking, or climbing stairs that is expected to last >3 days?: No Does the patient have a NEW difficulty with communication that is expected to last >3 days?: No Is the patient deaf or have difficulty hearing?: Yes Does the patient have difficulty seeing, even when wearing glasses/contacts?: Yes Does the patient have difficulty concentrating, remembering, or making decisions?: Yes  Permission Sought/Granted                  Emotional Assessment Appearance:: Appears stated age Attitude/Demeanor/Rapport: Unable to Assess Affect (typically observed): Unable to Assess Orientation: : Oriented to Self      Admission diagnosis:  Femur fracture (HCC) [S72.90XA] Closed displaced oblique fracture of shaft of right femur, initial encounter (HCC) [S72.331A] Patient Active Problem List   Diagnosis Date Noted   Femur fracture (HCC) 10/24/2023   Protein-calorie malnutrition, severe 09/29/2023   Closed right hip fracture, initial encounter (HCC) 09/25/2023   Cervical spondylosis 09/25/2023   Paraparesis (HCC) 09/25/2023   Macrocytic anemia 09/25/2023   Hypocalcemia 09/25/2023  Altered mental status 07/21/2023   CVA (cerebral vascular accident) (HCC) 07/18/2023   Shuffling gait 07/03/2021   Episodes of formed visual hallucinations 07/03/2021   Nocturnal enuresis 07/03/2021   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   TBI (traumatic brain injury) (HCC) 07/03/2021   History of short term memory loss 07/03/2021   PCP:  Margot Ables, MD  (Inactive) Pharmacy:   [facility] [id number] - [place], [place] - [address] [place] [address] Phone: [phone] Fax: [phone]     Social Drivers of Health (SDOH) Social History: SDOH Screenings   Food Insecurity: No Food Insecurity (10/24/2023)  Housing: Low Risk  (10/24/2023)  Transportation Needs: No Transportation Needs (10/24/2023)  Utilities: Not At Risk (10/24/2023)  Financial Resource Strain: Low Risk  (10/20/2022)   Received from [facility]  Physical Activity: Unknown (10/20/2022)   Received from [facility], [facility]  Social Connections: Moderately Isolated (10/24/2023)  Stress: Patient Declined (10/20/2022)   Received from [facility], [facility]  Tobacco Use: High Risk (10/24/2023)   SDOH Interventions:     Readmission Risk Interventions     No data to display

## 2023-10-25 NOTE — [place] FL2 (Signed)
 [place] MEDICAID FL2 LEVEL OF CARE FORM     IDENTIFICATION  Patient Name: Ernest Campbell Birthdate: 04-02-1951 Sex: male Admission Date (Current Location): 10/24/2023  [facility] and [place] Number:  Producer, television/film/video and Address:  [facility], [address] [place], [place] [address]      Provider Number: [id number]  Attending Physician Name and Address:  Joycelyn Das, MD  Relative Name and Phone Number:  DORIEN, MAYOTTE Spouse   [phone]    Current Level of Care: Hospital Recommended Level of Care: Skilled Nursing Facility Prior Approval Number:    Date Approved/Denied:   PASRR Number: [id number]  Discharge Plan: SNF    Current Diagnoses: Patient Active Problem List   Diagnosis Date Noted   Femur fracture (HCC) 10/24/2023   Protein-calorie malnutrition, severe 09/29/2023   Closed right hip fracture, initial encounter (HCC) 09/25/2023   Cervical spondylosis 09/25/2023   Paraparesis (HCC) 09/25/2023   Macrocytic anemia 09/25/2023   Hypocalcemia 09/25/2023   Altered mental status 07/21/2023   CVA (cerebral vascular accident) (HCC) 07/18/2023   Shuffling gait 07/03/2021   Episodes of formed visual hallucinations 07/03/2021   Nocturnal enuresis 07/03/2021   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   TBI (traumatic brain injury) (HCC) 07/03/2021   History of short term memory loss 07/03/2021    Orientation RESPIRATION BLADDER Height & Weight     Self  Normal Incontinent Weight: 129 lb 10.1 oz (58.8 kg) Height:  5\' 6"  (167.6 cm)  BEHAVIORAL SYMPTOMS/MOOD NEUROLOGICAL BOWEL NUTRITION STATUS      Incontinent Diet (see discharge summary)  AMBULATORY STATUS COMMUNICATION OF NEEDS Skin   Total Care Verbally Surgical wounds, Other (Comment) (ecchymosis, redness)                       Personal Care Assistance Level of Assistance  Bathing, Feeding, Dressing, Total care  Bathing Assistance: Maximum assistance Feeding assistance: Maximum assistance Dressing Assistance: Maximum assistance Total Care Assistance: Maximum assistance   Functional Limitations Info  Sight, Hearing, Speech Sight Info: Adequate Hearing Info: Impaired Speech Info: Adequate    SPECIAL CARE FACTORS FREQUENCY  PT (By licensed PT), OT (By licensed OT)     PT Frequency: 5x week OT Frequency: 5x week            Contractures Contractures Info: Not present    Additional Factors Info  Code Status, Allergies Code Status Info: full Allergies Info: Fentanyl And Related, Other, Prednisone, Codeine, Morphine, Oxycodone           Current Medications (10/25/2023):  This is the current hospital active medication list Current Facility-Administered Medications  Medication Dose Route Frequency Provider Last Rate Last Admin   0.9 %  sodium chloride infusion   Intravenous Continuous [place] Bali, PA-C   Stopped at 10/25/23 0128   acetaminophen (TYLENOL) tablet 650 mg  650 mg Oral Q6H Kathlen Mody, MD   650 mg at 10/25/23 1220   aspirin tablet 325 mg  325 mg Oral Daily [place] Bali, PA-C   325 mg at 10/25/23 0900   carbidopa-levodopa (SINEMET IR) 25-100 MG per tablet immediate release 1 tablet  1 tablet Oral TID WC McClung, Sarah A, PA-C   1 tablet at 10/25/23 1220   diphenhydrAMINE (BENADRYL) 12.5 MG/5ML elixir 12.5-25 mg  12.5-25 mg Oral Q4H PRN Thyra Breed A, PA-C       docusate sodium (COLACE) capsule 100 mg  100 mg Oral BID West Bali, PA-C   100 mg at 10/25/23 0900   feeding supplement (ENSURE ENLIVE / ENSURE PLUS) liquid 237 mL  237 mL Oral BID BM Pokhrel, Laxman, MD   237 mL at 10/25/23 1456   hydrALAZINE (APRESOLINE) tablet 10 mg  10 mg Oral Q6H PRN West Bali, PA-C       metoCLOPramide (REGLAN) tablet 5-10 mg  5-10 mg Oral Q8H PRN Sharon Seller, Sarah A, PA-C       Or   metoCLOPramide (REGLAN) injection 5-10 mg  5-10 mg Intravenous Q8H PRN West Bali,  PA-C       [START ON 10/26/2023] multivitamin with minerals tablet 1 tablet  1 tablet Oral Daily Pokhrel, Laxman, MD       ondansetron (ZOFRAN) tablet 4 mg  4 mg Oral Q6H PRN Sharon Seller, Sarah A, PA-C       Or   ondansetron (ZOFRAN) injection 4 mg  4 mg Intravenous Q6H PRN McClung, Sarah A, PA-C       polyethylene glycol (MIRALAX / GLYCOLAX) packet 17 g  17 g Oral Daily PRN West Bali, PA-C         Discharge Medications: Please see discharge summary for a list of discharge medications.  Relevant Imaging Results:  Relevant Lab Results:   Additional Information SSN: [id number]  Lorri Frederick, LCSW

## 2023-10-25 NOTE — Progress Notes (Signed)
 Yellow MEWS noted on patient, informed Anthoney Harada NP of latest BP and heart rate, 250 mls bolus ordered and stat cbc.   10/25/23 2023 10/25/23 2031 10/25/23 2033  Assess: MEWS Score  Temp 98.4 F (36.9 C) 98.5 F (36.9 C)  --   BP (!) 80/60 (!) 90/52 (!) 90/50  MAP (mmHg) 65 65  --   Pulse Rate (!) 107 (!) 103  --   Resp 19 18  --   Level of Consciousness  --  Alert  --   SpO2 96 % 100 %  --   O2 Device Room Air Room Air  --   Assess: MEWS Score  MEWS Temp 0 0 0  MEWS Systolic 2 1 1   MEWS Pulse 1 1 1   MEWS RR 0 0 0  MEWS LOC 0 0 0  MEWS Score 3 2 2   MEWS Score Color Yellow Yellow Yellow  Assess: if the MEWS score is Yellow or Red  Were vital signs accurate and taken at a resting state?  --  Yes  --   Does the patient meet 2 or more of the SIRS criteria?  --  No  --   MEWS guidelines implemented   --  Yes, yellow  --   Treat  MEWS Interventions  --  Considered administering scheduled or prn medications/treatments as ordered  --   Take Vital Signs  Increase Vital Sign Frequency   --  Yellow: Q2hr x1, continue Q4hrs until patient remains green for 12hrs  --   Escalate  MEWS: Escalate  --  Yellow: Discuss with charge nurse and consider notifying provider and/or RRT  --   Notify: Charge Nurse/RN  Name of Charge Nurse/RN Notified  --  Mahima  --   Provider Notification  Provider Name/Title  --  Anthoney Harada NP  --   Date Provider Notified  --  10/25/23  --   Time Provider Notified  --  2038  --   Method of Notification  --  Page  --   Notification Reason  --   (yelllow Mews)  --   Provider response  --  See new orders  --   Date of Provider Response  --  10/25/23  --   Time of Provider Response  --  2052  --   Assess: SIRS CRITERIA  SIRS Temperature  0 0  --   SIRS Respirations  0 0  --   SIRS Pulse 1 1  --   SIRS WBC 0 0  --   SIRS Score Sum  1 1  --

## 2023-10-25 NOTE — Discharge Instructions (Signed)
 [facility] Discharge Instructions   General Discharge Instructions  WEIGHT BEARING STATUS:Weightbearing as tolerated right lower extremity for walker ambulation  RANGE OF MOTION/ACTIVITY: Unrestricted range of motion  Wound Care: You may remove your surgical dressing on post op day 2 (Saturday 10/26/23). Incisions can be left open to air if there is no drainage. Once the incision is completely dry and without drainage, it may be left open to air out.  Showering may begin post op day 3 (Sunday 10/27/23).  Clean incision gently with soap and water.  DVT/PE prophylaxis: Aspirin x 30 days  Diet: as you were eating previously.  Can use over the counter stool softeners and bowel preparations, such as Miralax, to help with bowel movements.  Narcotics can be constipating.  Be sure to drink plenty of fluids  PAIN MEDICATION USE AND EXPECTATIONS  You have likely been given narcotic medications to help control your pain.  After a traumatic event that results in an fracture (broken bone) with or without surgery, it is ok to use narcotic pain medications to help control one's pain.  We understand that everyone responds to pain differently and each individual patient will be evaluated on a regular basis for the continued need for narcotic medications. Ideally, narcotic medication use should last no more than 6-8 weeks (coinciding with fracture healing).   As a patient it is your responsibility as well to monitor narcotic medication use and report the amount and frequency you use these medications when you come to your office visit.   We would also advise that if you are using narcotic medications, you should take a dose prior to therapy to maximize you participation.  IF YOU ARE ON NARCOTIC MEDICATIONS IT IS NOT PERMISSIBLE TO OPERATE A MOTOR VEHICLE (MOTORCYCLE/CAR/TRUCK/MOPED) OR HEAVY MACHINERY DO NOT MIX NARCOTICS WITH OTHER CNS (CENTRAL NERVOUS SYSTEM) DEPRESSANTS SUCH AS  ALCOHOL   STOP SMOKING OR USING NICOTINE PRODUCTS!!!!  As discussed nicotine severely impairs your body's ability to heal surgical and traumatic wounds but also impairs bone healing.  Wounds and bone heal by forming microscopic blood vessels (angiogenesis) and nicotine is a vasoconstrictor (essentially, shrinks blood vessels).  Therefore, if vasoconstriction occurs to these microscopic blood vessels they essentially disappear and are unable to deliver necessary nutrients to the healing tissue.  This is one modifiable factor that you can do to dramatically increase your chances of healing your injury.    (This means no smoking, no nicotine gum, patches, etc)  DO NOT USE NONSTEROIDAL ANTI-INFLAMMATORY DRUGS (NSAID'S)  Using products such as Advil (ibuprofen), Aleve (naproxen), Motrin (ibuprofen) for additional pain control during fracture healing can delay and/or prevent the healing response.  If you would like to take over the counter (OTC) medication, Tylenol (acetaminophen) is ok.  However, some narcotic medications that are given for pain control contain acetaminophen as well. Therefore, you should not exceed more than 4000 mg of tylenol in a day if you do not have liver disease.  Also note that there are may OTC medicines, such as cold medicines and allergy medicines that my contain tylenol as well.  If you have any questions about medications and/or interactions please ask your doctor/PA or your pharmacist.      ICE AND ELEVATE INJURED/OPERATIVE EXTREMITY  Using ice and elevating the injured extremity above your heart can help with swelling and pain control.  Icing in a pulsatile fashion, such as 20 minutes on and 20 minutes off, can be followed.    Do  not place ice directly on skin. Make sure there is a barrier between to skin and the ice pack.    Using frozen items such as frozen peas works well as the conform nicely to the are that needs to be iced.  USE AN ACE WRAP OR TED HOSE FOR SWELLING  CONTROL  In addition to icing and elevation, Ace wraps or TED hose are used to help limit and resolve swelling.  It is recommended to use Ace wraps or TED hose until you are informed to stop.    When using Ace Wraps start the wrapping distally (farthest away from the body) and wrap proximally (closer to the body)   Example: If you had surgery on your leg or thing and you do not have a splint on, start the ace wrap at the toes and work your way up to the thigh        If you had surgery on your upper extremity and do not have a splint on, start the ace wrap at your fingers and work your way up to the upper arm   CALL THE OFFICE WITH ANY QUESTIONS OR CONCERNS: [phone]   VISIT OUR WEBSITE FOR ADDITIONAL INFORMATION: [email or web address]   Discharge Wound Care Instructions  Do NOT apply any ointments, solutions or lotions to pin sites or surgical wounds.  These prevent needed drainage and even though solutions like hydrogen peroxide kill bacteria, they also damage cells lining the pin sites that help fight infection.  Applying lotions or ointments can keep the wounds moist and can cause them to breakdown and open up as well. This can increase the risk for infection. When in doubt call the office.  Surgical incisions should be dressed daily.  If any drainage is noted, use foam dressings - These dressing supplies should be available at local medical supply stores [facility], [facility], etc) as well as Insurance claims handler ([employer], [facility], [employer], etc)  Once the incision is completely dry and without drainage, it may be left open to air out.  Showering may begin 36-48 hours later.  Cleaning gently with soap and water.

## 2023-10-25 NOTE — Evaluation (Signed)
 Occupational Therapy Evaluation Patient Details Name: Ernest Campbell MRN: [id number] DOB: Apr 18, 1951 Today's Date: 10/25/2023   History of Present Illness   73 yo male presents to [facility] on 3/20 s/p fall at [facility] with + R hip pain. Pt sustained R periprosthetic femur fx, s/p ORIF 3/20. Recent admissions for agitation 2/7 and R hip ORIF 09/26/23. PMH includes recent CVA, TBI, dementia, and parkinsonism.     Clinical Impressions Pt admitted for above and and presents with problem list below.  Pt requires max assist +2 for bed mobility, max-total assist +2 for ADLs, and max assist +2 to stand with RW very briefly.  Pt limited by pain, balance, weakness and cognition. He is only oriented to self.  Based on performance today, believe patient will best benefit from continued OT services acutely and after dc at inpatient setting with <3hrs/day to optimize independence, safety with ADLs and mobility.      If plan is discharge home, recommend the following:   Two people to help with walking and/or transfers;Two people to help with bathing/dressing/bathroom     Functional Status Assessment   Patient has had a recent decline in their functional status and demonstrates the ability to make significant improvements in function in a reasonable and predictable amount of time.     Equipment Recommendations   Other (comment) (defer)     Recommendations for Other Services         Precautions/Restrictions   Precautions Precautions: Fall Recall of Precautions/Restrictions: Impaired Restrictions Weight Bearing Restrictions Per Provider Order: No Other Position/Activity Restrictions: RLE WBAT with RW     Mobility Bed Mobility Overal bed mobility: Needs Assistance Bed Mobility: Supine to Sit, Rolling, Sit to Supine Rolling: Max assist, +2 for safety/equipment, Used rails   Supine to sit: Max assist, +2 for physical assistance Sit to supine: Max assist, +2 for physical  assistance   General bed mobility comments: assist for trunk and LE management, boost up in bed, scooting to/from EOB. Rolling bilat multiple times for pericare, exclaiming in pain with roll towards R    Transfers Overall transfer level: Needs assistance Equipment used: Rolling walker (2 wheels) Transfers: Sit to/from Stand Sit to Stand: Max assist, +2 physical assistance           General transfer comment: assist for power up, rise, steadying. Very limited WB through RLE given pain      Balance Overall balance assessment: Needs assistance Sitting-balance support: Feet supported, Bilateral upper extremity supported Sitting balance-Leahy Scale: Poor Sitting balance - Comments: fair to poor, periods of light assist to correct posterior bias   Standing balance support: Bilateral upper extremity supported, During functional activity Standing balance-Leahy Scale: Zero Standing balance comment: max +2                           ADL either performed or assessed with clinical judgement   ADL Overall ADL's : Needs assistance/impaired     Grooming: Wash/dry face;Maximal assistance;Sitting Grooming Details (indicate cue type and reason): assist to initate, able to engage briefly but then requires assist to complete                             Functional mobility during ADLs: Maximal assistance;+2 for physical assistance General ADL Comments: max-total assist for all ADls due to cognition and pain     Vision   Vision Assessment?: No apparent visual deficits  Perception         Praxis         Pertinent Vitals/Pain Pain Assessment Pain Assessment: Faces Faces Pain Scale: Hurts even more Pain Location: RLE Pain Descriptors / Indicators: Grimacing, Guarding, Moaning Pain Intervention(s): Limited activity within patient's tolerance, Monitored during session, Repositioned     Extremity/Trunk Assessment Upper Extremity Assessment Upper Extremity  Assessment: Generalized weakness   Lower Extremity Assessment Lower Extremity Assessment: Defer to PT evaluation RLE Deficits / Details: anticipated post-operative weakness and guarding; able to perform ankle pumps, minimal ROM heel slide RLE: Unable to fully assess due to pain   Cervical / Trunk Assessment Cervical / Trunk Assessment: Normal   Communication Communication Communication: Impaired Factors Affecting Communication: Difficulty expressing self;Reduced clarity of speech   Cognition Arousal: Alert Behavior During Therapy: Restless Cognition: History of cognitive impairments             OT - Cognition Comments: pt oriented to self only, reports being at [facility]. intermittently follows 1 step commands                 Following commands: Impaired Following commands impaired: Follows one step commands with increased time, Follows one step commands inconsistently     Cueing  General Comments   Cueing Techniques: Verbal cues;Gestural cues;Tactile cues      Exercises     Shoulder Instructions      Home Living Family/patient expects to be discharged to:: Skilled nursing facility                                 Additional Comments: from SNF      Prior Functioning/Environment Prior Level of Function : Needs assist             Mobility Comments: as of 4 weeks ago, per chart review pt ambulating with and without RW prior to initial fall and hip fx. Last admission, pt was transfer-level with RW ADLs Comments: unsure, since last hip fx and surgery 4 weeks ago    OT Problem List: Decreased strength;Decreased activity tolerance;Impaired balance (sitting and/or standing);Decreased coordination;Decreased cognition;Decreased safety awareness;Decreased knowledge of use of DME or AE;Decreased knowledge of precautions;Pain   OT Treatment/Interventions: Self-care/ADL training;Therapeutic exercise;DME and/or AE instruction;Therapeutic  activities;Patient/family education;Balance training      OT Goals(Current goals can be found in the care plan section)   Acute Rehab OT Goals Patient Stated Goal: none stated OT Goal Formulation: Patient unable to participate in goal setting Time For Goal Achievement: 11/08/23 Potential to Achieve Goals: Good   OT Frequency:  Min 2X/week    Co-evaluation PT/OT/SLP Co-Evaluation/Treatment: Yes Reason for Co-Treatment: For patient/therapist safety;To address functional/ADL transfers PT goals addressed during session: Mobility/safety with mobility;Balance OT goals addressed during session: ADL's and self-care      AM-PAC OT "6 Clicks" Daily Activity     Outcome Measure Help from another person eating meals?: A Lot Help from another person taking care of personal grooming?: A Lot Help from another person toileting, which includes using toliet, bedpan, or urinal?: Total Help from another person bathing (including washing, rinsing, drying)?: Total Help from another person to put on and taking off regular upper body clothing?: A Lot Help from another person to put on and taking off regular lower body clothing?: Total 6 Click Score: 9   End of Session Equipment Utilized During Treatment: Rolling walker (2 wheels) Nurse Communication: Mobility status  Activity Tolerance: Patient  limited by pain Patient left: in bed;with call bell/phone within reach;with bed alarm set;with restraints reapplied  OT Visit Diagnosis: Other abnormalities of gait and mobility (R26.89);Muscle weakness (generalized) (M62.81);Pain;History of falling (Z91.81) Pain - Right/Left: Right Pain - part of body: Leg                Time: 4098-1191 OT Time Calculation (min): 24 min Charges:  OT General Charges $OT Visit: 1 Visit OT Evaluation $OT Eval Moderate Complexity: 1 Mod  Barry Brunner, OT Acute Rehabilitation Services Office [phone] Secure Chat Preferred    Chancy Milroy 10/25/2023, 1:18 PM

## 2023-10-25 NOTE — Progress Notes (Signed)
 Orthopaedic Trauma Progress Note  S: Responds somewhat to commands. No family at bedside  O:  Vitals:   10/25/23 0337 10/25/23 0800  BP: (!) 102/55 108/60  Pulse: 98 98  Resp: 18 18  Temp: 98.9 F (37.2 C) 97.8 F (36.6 C)  SpO2: 99%     NAD, awake and alert, appear comfortable RLE: Dressing in placed, clean and dry, compartments soft and compressible. Active motion of toes and ankle. Not cooperative with neuro exam otherwise  Imaging: Stable postop imaging  Labs:  Results for orders placed or performed during the hospital encounter of 10/24/23 (from the past 24 hours)  Type and screen [facility]     Status: None   Collection Time: 10/24/23 10:10 AM  Result Value Ref Range   ABO/RH(D) O POS    Antibody Screen NEG    Sample Expiration      10/27/2023,2359 Performed at [facility], [address] [place], [place] [address]   Glucose, capillary     Status: Abnormal   Collection Time: 10/24/23  4:55 PM  Result Value Ref Range   Glucose-Capillary 117 (H) 70 - 99 mg/dL  Vitamin U04     Status: None   Collection Time: 10/24/23  5:35 PM  Result Value Ref Range   Vitamin B-12 423 180 - 914 pg/mL  Folate     Status: None   Collection Time: 10/24/23  5:35 PM  Result Value Ref Range   Folate 11.0 >5.9 ng/mL  Glucose, capillary     Status: Abnormal   Collection Time: 10/24/23 11:56 PM  Result Value Ref Range   Glucose-Capillary 126 (H) 70 - 99 mg/dL  CBC     Status: Abnormal   Collection Time: 10/25/23  5:56 AM  Result Value Ref Range   WBC 7.9 4.0 - 10.5 K/uL   RBC 2.14 (L) 4.22 - 5.81 MIL/uL   Hemoglobin 7.0 (L) 13.0 - 17.0 g/dL   HCT 54.0 (L) 98.1 - 19.1 %   MCV 96.7 80.0 - 100.0 fL   MCH 32.7 26.0 - 34.0 pg   MCHC 33.8 30.0 - 36.0 g/dL   RDW 47.8 29.5 - 62.1 %   Platelets 235 150 - 400 K/uL   nRBC 0.0 0.0 - 0.2 %  Basic metabolic panel     Status: Abnormal   Collection Time: 10/25/23  5:56 AM  Result Value Ref Range   Sodium 135 135 -  145 mmol/L   Potassium 4.2 3.5 - 5.1 mmol/L   Chloride 102 98 - 111 mmol/L   CO2 23 22 - 32 mmol/L   Glucose, Bld 121 (H) 70 - 99 mg/dL   BUN 14 8 - 23 mg/dL   Creatinine, Ser 3.08 0.61 - 1.24 mg/dL   Calcium 7.9 (L) 8.9 - 10.3 mg/dL   GFR, Estimated >65 >78 mL/min   Anion gap 10 5 - 15  Glucose, capillary     Status: Abnormal   Collection Time: 10/25/23  5:56 AM  Result Value Ref Range   Glucose-Capillary 110 (H) 70 - 99 mg/dL    Assessment: 73 yo M w/ periprosthetic femur fracture s/p ORIF   Weightbearing: WBAT with walker  Insicional and dressing care: Dressing to remain in place tomorrow then will change  Orthopedic device(s):None  CV/Blood loss:7.0 this AM, will defer to hospitalist regarding transfusion. Hemodynamics appear okay  Pain management: No narcotics, tylenol only  VTE prophylaxis: Asa 325mg   ID: Postop ancef  Dispo: PT/OT  will need new facility upon discharge as patients wife does not want to return to [facility]  Follow - up plan: 2 weeks for x-rays and wound check  Roby Lofts, MD [facility] [phone] (office) [email or web address]

## 2023-10-25 NOTE — Evaluation (Signed)
 Physical Therapy Evaluation Patient Details Name: Ernest Campbell MRN: [id number] DOB: Apr 30, 1951 Today's Date: 10/25/2023  History of Present Illness  73 yo male presents to [facility] on 3/20 s/p fall at [facility] with + R hip pain. Pt sustained R periprosthetic femur fx, s/p ORIF 3/20. Recent admissions for agitation 2/7 and R hip ORIF 09/26/23. PMH includes recent CVA, TBI, dementia, and parkinsonism.  Clinical Impression   Pt presents with generalized weakness, RLE pain, impaired RLE ROM, impaired balance with history of falls, and decreased activity tolerance. Pt to benefit from acute PT to address deficits. Pt requiring max +2 for bed mobility and transfer into standing, very limited WB through RLE given pain. Patient will benefit from continued inpatient follow up therapy, <3 hours/day. PT to progress mobility as tolerated, and will continue to follow acutely.          If plan is discharge home, recommend the following: Two people to help with walking and/or transfers;Two people to help with bathing/dressing/bathroom;Assistance with feeding;Direct supervision/assist for medications management   Can travel by private vehicle        Equipment Recommendations None recommended by PT  Recommendations for Other Services       Functional Status Assessment Patient has had a recent decline in their functional status and demonstrates the ability to make significant improvements in function in a reasonable and predictable amount of time.     Precautions / Restrictions Precautions Precautions: Fall Recall of Precautions/Restrictions: Impaired Restrictions Weight Bearing Restrictions Per Provider Order: No Other Position/Activity Restrictions: RLE WBAT with RW      Mobility  Bed Mobility Overal bed mobility: Needs Assistance Bed Mobility: Supine to Sit, Rolling, Sit to Supine Rolling: Max assist, +2 for safety/equipment, Used rails   Supine to sit: Max assist, +2 for physical  assistance Sit to supine: Max assist, +2 for physical assistance   General bed mobility comments: assist for trunk and LE management, boost up in bed, scooting to/from EOB. Rolling bilat multiple times for pericare, exclaiming in pain with roll towards R    Transfers Overall transfer level: Needs assistance Equipment used: Rolling walker (2 wheels) Transfers: Sit to/from Stand Sit to Stand: Max assist, +2 physical assistance           General transfer comment: assist for power up, rise, steadying. Very limited WB through RLE given pain    Ambulation/Gait               General Gait Details: unable to attempt  Stairs            Wheelchair Mobility     Tilt Bed    Modified Rankin (Stroke Patients Only)       Balance Overall balance assessment: Needs assistance Sitting-balance support: Feet supported, Bilateral upper extremity supported Sitting balance-Leahy Scale: Poor Sitting balance - Comments: fair to poor, periods of light assist to correct posterior bias   Standing balance support: Bilateral upper extremity supported, During functional activity Standing balance-Leahy Scale: Zero Standing balance comment: max +2                             Pertinent Vitals/Pain Pain Assessment Pain Assessment: Faces Faces Pain Scale: Hurts even more Pain Location: RLE Pain Descriptors / Indicators: Grimacing, Guarding, Moaning Pain Intervention(s): Limited activity within patient's tolerance, Monitored during session, Repositioned, Premedicated before session    Home Living Family/patient expects to be discharged to:: Skilled nursing facility  Additional Comments: from SNF    Prior Function Prior Level of Function : Needs assist             Mobility Comments: as of 4 weeks ago, per chart review pt ambulating with and without RW prior to initial fall and hip fx. Last admission, pt was transfer-level with RW ADLs  Comments: unsure, since last hip fx and surgery 4 weeks ago     Extremity/Trunk Assessment   Upper Extremity Assessment Upper Extremity Assessment: Defer to OT evaluation    Lower Extremity Assessment Lower Extremity Assessment: Generalized weakness;RLE deficits/detail RLE Deficits / Details: anticipated post-operative weakness and guarding; able to perform ankle pumps, minimal ROM heel slide RLE: Unable to fully assess due to pain    Cervical / Trunk Assessment Cervical / Trunk Assessment: Normal  Communication   Communication Communication: Impaired Factors Affecting Communication: Difficulty expressing self;Reduced clarity of speech    Cognition Arousal: Alert Behavior During Therapy: Restless   PT - Cognitive impairments: History of cognitive impairments                       PT - Cognition Comments: history of dementia, TBI, CVA. Pt cursing when in pain, restless/irritable but not agitated this date. Follows most one-step cues, struggles with ADL tasks (washing face, drinking through straw), requiring cues to perform Following commands: Impaired Following commands impaired: Follows one step commands with increased time     Cueing Cueing Techniques: Verbal cues, Gestural cues, Tactile cues     General Comments      Exercises     Assessment/Plan    PT Assessment Patient needs continued PT services  PT Problem List Decreased strength;Decreased mobility;Decreased activity tolerance;Decreased balance;Decreased knowledge of use of DME;Pain;Decreased range of motion;Decreased cognition;Decreased coordination;Decreased safety awareness       PT Treatment Interventions DME instruction;Therapeutic activities;Gait training;Therapeutic exercise;Patient/family education;Balance training;Stair training;Functional mobility training;Neuromuscular re-education    PT Goals (Current goals can be found in the Care Plan section)  Acute Rehab PT Goals PT Goal Formulation:  With patient Time For Goal Achievement: 11/08/23 Potential to Achieve Goals: Good    Frequency Min 3X/week     Co-evaluation PT/OT/SLP Co-Evaluation/Treatment: Yes Reason for Co-Treatment: For patient/therapist safety;To address functional/ADL transfers PT goals addressed during session: Mobility/safety with mobility;Balance         AM-PAC PT "6 Clicks" Mobility  Outcome Measure Help needed turning from your back to your side while in a flat bed without using bedrails?: A Lot Help needed moving from lying on your back to sitting on the side of a flat bed without using bedrails?: Total Help needed moving to and from a bed to a chair (including a wheelchair)?: Total Help needed standing up from a chair using your arms (e.g., wheelchair or bedside chair)?: Total Help needed to walk in hospital room?: Total Help needed climbing 3-5 steps with a railing? : Total 6 Click Score: 7    End of Session   Activity Tolerance: Patient limited by pain;Patient limited by fatigue Patient left: in bed;with call bell/phone within reach;with bed alarm set;with chair alarm set;Other (comment) (bilat mitts donned and fall pads in place) Nurse Communication: Mobility status PT Visit Diagnosis: Other abnormalities of gait and mobility (R26.89);Muscle weakness (generalized) (M62.81)    Time: 5784-6962 PT Time Calculation (min) (ACUTE ONLY): 25 min   Charges:   PT Evaluation $PT Eval Low Complexity: 1 Low   PT General Charges $$ ACUTE PT VISIT: 1 Visit  Marye Round, PT DPT Acute Rehabilitation Services Secure Chat Preferred  Office [phone]   Truddie Coco 10/25/2023, 11:57 AM

## 2023-10-25 NOTE — Care Management Important Message (Signed)
 Important Message  Patient Details  Name: Ernest Campbell MRN: [id number] Date of Birth: 02-21-1951   Important Message Given:  Yes - Medicare IM     Sherilyn Banker 10/25/2023, 11:56 AM

## 2023-10-25 NOTE — Progress Notes (Addendum)
 Initial Nutrition Assessment  DOCUMENTATION CODES:   Severe malnutrition in context of chronic illness  INTERVENTION:   Ensure Enlive po BID, each supplement provides 350 kcal and 20 grams of protein.  Daily MVI with minerals.   NUTRITION DIAGNOSIS:   Severe Malnutrition related to lethargy/confusion as evidenced by severe muscle depletion, severe fat depletion.  GOAL:   Patient will meet greater than or equal to 90% of their needs  MONITOR:   PO intake, Supplement acceptance, Weight trends  REASON FOR ASSESSMENT:   Consult Assessment of nutrition requirement/status  ASSESSMENT:   Admitted from SNF for R femur fracture s/p ORIF 3/20. PMH includes dementia, stroke, TBI with muscle rigidity and cognitive deficit.  3/20- surgery for fixation of femoral shaft fracture.   No family at bedside and pt unable to answer participate in interview. RN reports pt only eating bites of breakfast this AM. Pt requires full assistance with meals. RN reports pt is better at drinking fluids than eating foods provided. RN reports pt ate all of applesauce with meds.   Labs reviewed: CMP WNL, Calcium 7.9. A1c 5.8%, measured 12/24.   Meds reviewed. Colace BID  NUTRITION - FOCUSED PHYSICAL EXAM:  Flowsheet Row Most Recent Value  Orbital Region Moderate depletion  Upper Arm Region Moderate depletion  Thoracic and Lumbar Region Severe depletion  Buccal Region Moderate depletion  Temple Region Severe depletion  Clavicle Bone Region Severe depletion  Clavicle and Acromion Bone Region Severe depletion  Scapular Bone Region Moderate depletion  Dorsal Hand Unable to assess  [had mits on hand]  Patellar Region Moderate depletion  Anterior Thigh Region Severe depletion  Posterior Calf Region Moderate depletion  Edema (RD Assessment) None  Hair Reviewed  Eyes Reviewed  Mouth Reviewed  [dentures]  Skin Reviewed  Nails Reviewed    Diet Order:   Diet Order             Diet regular Room  service appropriate? Yes; Fluid consistency: Thin  Diet effective now                   EDUCATION NEEDS:   Not appropriate for education at this time  Skin:  Skin Assessment: Reviewed RN Assessment  Last BM:  3/21 T4  Height:   Ht Readings from Last 1 Encounters:  10/24/23 5\' 6"  (1.676 m)    Weight:   Wt Readings from Last 1 Encounters:  10/24/23 58.8 kg    Ideal Body Weight:     BMI:  Body mass index is 20.92 kg/m.  Estimated Nutritional Needs:   Kcal:  1500-1800 kcal  Protein:  70-100 g  Fluid:  > 1.5 L  Deane Melick, MPH, RD, LDN Clinical Dietitian Contact information can be found at [facility].

## 2023-10-25 NOTE — Progress Notes (Addendum)
 PROGRESS NOTE  CARREL LEATHER NWG:[id number] DOB: 02-16-1951 DOA: 10/24/2023 PCP: Margot Ables, MD (Inactive)   LOS: 1 day   Brief narrative:  JAMIL ARMWOOD is a 73 y.o. 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  Patient has cognitive deficits and not able to provide much history.   Patient was recently admitted for right hip fracture and was discharged on 09/30/2023 after patient underwent nailing of the right intertrochanteric femur fracture. In the ER x-rays revealed acute oblique displaced angulated foreshortened fracture of the mid right femur.  On-call orthopedic surgeon was consulted.  CT head scan was negative.  CT cervical spine with degenerative changes noted.Labs show hemoglobin of 10.6 creatinine 0.8 platelets 294 EKG shows normal sinus rhythm.  Patient was then admitted hospital for further evaluation and treatment.  Assessment/Plan: Principal Problem:   Femur fracture (HCC) Active Problems:   TBI (traumatic brain injury) (HCC)   History of short term memory loss   Macrocytic anemia  Right femur fracture - patient had an unwitnessed fall.  CT head and C-spine negative for acute finding.  Orthopedic was consulted and patient underwent open reduction and internal fixation of right femoral fracture on 10/24/2019.  Patient's wife has requested that patient not to be given any narcotics for pain management.  Tylenol is okay.  Wife also does not want the patient to be on any muscle relaxants.  Orthopedics recommended weightbearing as tolerated with walker.  Anemia likely secondary to postoperative blood loss.  Hemoglobin of 7.0 this morning.  Hemodynamically stable.  On aspirin 325 for VTE prophylaxis.  History of traumatic brain injury with muscle rigidity and cognitive  deficits, on Sinemet and Namenda.  History of CVA  On aspirin at home.  Has been resumed.  Chronic macrocytic anemia Vitamin B12 423, folic acid 11.  Hemoglobin today at 7.0. spoke with the patient's wife and ok with PRBC transfusion I unit  Nutrition Status: Body mass index is 20.92 kg/m.  Nutrition Problem: Severe Malnutrition Etiology: social / environmental circumstances (dementia) Signs/Symptoms: severe muscle depletion, severe fat depletion Interventions: Ensure Enlive (each supplement provides 350kcal and 20 grams of protein), MVI Continue nutritional supplements.  DVT prophylaxis: SCDs Start: 10/24/23 1557 SCDs Start: 10/24/23 0551   Disposition:  Physical therapy has recommended skilled nursing facility placement.  Will consult TOC.  Patient's wife does not wish [facility] facility and requested new facility.  Status is: Inpatient Remains inpatient appropriate because: Status post hip fracture, need for rehabilitation, pending clinical improvement    Code Status:     Code Status: Full Code  Family Communication: None at bedside.  Spoke with patient's wife on the phone and updated her about the clinical condition of the patient.  Consultants: Orthopedics  Procedures: Open reduction and internal fixation of Right femoral shaft fracture on 10/24/2023 by Dr. Jena Gauss. PRBC transfusion 1 unit.  Anti-infectives:  Cefazolin x1  Anti-infectives (From admission, onward)    Start     Dose/Rate Route Frequency Ordered Stop   10/25/23 0200  ceFAZolin (ANCEF) IVPB 2g/100 mL premix        2 g 200 mL/hr over 30 Minutes Intravenous Every 8 hours 10/24/23 2120 10/25/23 0937   10/24/23 1645  ceFAZolin (ANCEF) IVPB 2g/100 mL premix  Status:  Discontinued        2 g  200 mL/hr over 30 Minutes Intravenous Every 8 hours 10/24/23 1557 10/24/23 2120   10/24/23 1414  vancomycin (VANCOCIN) powder  Status:  Discontinued          As needed 10/24/23 1414 10/24/23 1446   10/24/23 1000   ceFAZolin (ANCEF) IVPB 2g/100 mL premix        2 g 200 mL/hr over 30 Minutes Intravenous On call to O.R. 10/24/23 0954 10/24/23 1250      Subjective: Today, patient was seen and examined at bedside.  Patient is a poor historian.  Oriented to place only.  Answering few questions.  Denies overt pain.  Patient's wife does not wish him to be on muscle relaxant or morphine/narcotics.  She is okay with Tylenol or Motrin if needed for pain.  Objective: Vitals:   10/25/23 0800 10/25/23 1246  BP: 108/60   Pulse: 98   Resp: 18   Temp: 97.8 F (36.6 C) 97.8 F (36.6 C)  SpO2:      Intake/Output Summary (Last 24 hours) at 10/25/2023 1248 Last data filed at 10/25/2023 8657 Gross per 24 hour  Intake 2298.31 ml  Output --  Net 2298.31 ml   Filed Weights   10/24/23 0148 10/24/23 1827  Weight: 58.8 kg 58.8 kg   Body mass index is 20.92 kg/m.   Physical Exam:  GENERAL: Patient is alert awake Communicative, has cognitive impairment, mittens in place.  Oriented to place only, thinly built. HENT: No scleral pallor or icterus. Pupils equally reactive to light. Oral mucosa is moist NECK: is supple, no gross swelling noted. CHEST: Clear to auscultation. No crackles or wheezes.   CVS: S1 and S2 heard, no murmur. Regular rate and rhythm.  ABDOMEN: Soft, non-tender, bowel sounds are present. EXTREMITIES: No edema.  Dressing in the right lower extremities. CNS: Moves extremities. SKIN: warm and dry without rashes.  Data Review: I have personally reviewed the following laboratory data and studies,  CBC: Recent Labs  Lab 10/24/23 0301 10/25/23 0556  WBC 11.5* 7.9  NEUTROABS 6.9  --   HGB 10.6* 7.0*  HCT 32.2* 20.7*  MCV 100.6* 96.7  PLT 294 235   Basic Metabolic Panel: Recent Labs  Lab 10/24/23 0301 10/25/23 0556  NA 139 135  K 4.3 4.2  CL 107 102  CO2 23 23  GLUCOSE 111* 121*  BUN 26* 14  CREATININE 0.82 0.83  CALCIUM 8.6* 7.9*   Liver Function Tests: Recent Labs  Lab  10/24/23 0301  AST 20  ALT 20  ALKPHOS 133*  BILITOT 0.6  PROT 6.1*  ALBUMIN 3.2*   No results for input(s): "LIPASE", "AMYLASE" in the last 168 hours. No results for input(s): "AMMONIA" in the last 168 hours. Cardiac Enzymes: No results for input(s): "CKTOTAL", "CKMB", "CKMBINDEX", "TROPONINI" in the last 168 hours. BNP (last 3 results) No results for input(s): "BNP" in the last 8760 hours.  ProBNP (last 3 results) No results for input(s): "PROBNP" in the last 8760 hours.  CBG: Recent Labs  Lab 10/24/23 0900 10/24/23 1655 10/24/23 2356 10/25/23 0556  GLUCAP 97 117* 126* 110*   No results found for this or any previous visit (from the past 240 hours).   Studies: DG FEMUR, MIN 2 VIEWS RIGHT Result Date: 10/24/2023 CLINICAL DATA:  Elective surgery. EXAM: RIGHT FEMUR 2 VIEWS COMPARISON:  Preoperative imaging FINDINGS: 11 fluoroscopic spot views of the right femur submitted from the operating room. Lateral plate and screw fixation as well as cerclage wires traverse femoral shaft fracture.  Previous proximal femoral hardware. Fluoroscopy time 56 seconds. Dose 4.82 mGy. IMPRESSION: Intraoperative fluoroscopy during femoral shaft fracture fixation. Electronically Signed   By: Narda Rutherford M.D.   On: 10/24/2023 15:55   DG FEMUR PORT, MIN 2 VIEWS RIGHT Result Date: 10/24/2023 CLINICAL DATA:  Fracture, postop. EXAM: RIGHT FEMUR PORTABLE 2 VIEW COMPARISON:  Preoperative imaging earlier today FINDINGS: Lateral plate and screw fixation is well as cerclage wires traversing femoral shaft fracture. Previous right proximal femoral hardware remains in place. Recent postsurgical change includes air and edema in the soft tissues. IMPRESSION: ORIF of right femoral shaft fracture. Electronically Signed   By: Narda Rutherford M.D.   On: 10/24/2023 15:53   DG C-Arm 1-60 Min-No Report Result Date: 10/24/2023 Fluoroscopy was utilized by the requesting physician.  No radiographic interpretation.   DG  C-Arm 1-60 Min-No Report Result Date: 10/24/2023 Fluoroscopy was utilized by the requesting physician.  No radiographic interpretation.   CT Head Wo Contrast Result Date: 10/24/2023 CLINICAL DATA:  Unwitnessed fall, found on floor at his skilled nursing facility, Unknown head trauma, probable neck trauma. EXAM: CT HEAD WITHOUT CONTRAST CT CERVICAL SPINE WITHOUT CONTRAST TECHNIQUE: Multidetector CT imaging of the head and cervical spine was performed following the standard protocol without intravenous contrast. Multiplanar CT image reconstructions of the cervical spine were also generated. RADIATION DOSE REDUCTION: This exam was performed according to the departmental dose-optimization program which includes automated exposure control, adjustment of the mA and/or kV according to patient size and/or use of iterative reconstruction technique. COMPARISON:  Cervical spine CT 10/06/2023, CT head 10/06/2023 FINDINGS: CT HEAD FINDINGS Brain: There is mild-to-moderate cerebral atrophy, small vessel disease and atrophic ventriculomegaly, without midline shift. No cortical based acute infarct, hemorrhage or mass effect is seen. Basal cisterns are clear. Vascular: No hyperdense vessel or unexpected calcification. Skull: Negative for fractures or focal lesions. Sinuses/Orbits: Chronic opacification right maxillary sinus. Other sinuses and mastoid air cells are clear. Negative visualized orbits. There has been a previous right inferior nasal turbinectomy. Other: None. CT CERVICAL SPINE FINDINGS Alignment: Minimal grade 1 degenerative anterolisthesis again is noted at C4-5, C7-T1 and T1-2. No traumatic or further listhesis is seen with narrowing and osteophytes of the anterior atlantodental jointagain noted. Skull base and vertebrae: No acute fracture. No primary bone lesion or focal pathologic process. Mild osteopenia. Soft tissues and spinal canal: No prevertebral fluid or swelling. No visible canal hematoma. Disc levels:  There is mild disc space loss at C4-5 and C7-T1, more advanced disc space loss with bidirectional endplate osteophytes at C5-6 and C6-7. The other discs are normal in heights. There are no herniated discs or cord compromise. There is multilevel left greater than right facet joint hypertrophy, and uncinate spurring. Acquired foraminal stenosis is mild on the left at C3-4, severe on the left C4-5, bilaterally moderate C5-6, moderate to severe on the right and moderate on the left C6-7. The other foramina are clear. Upper chest: There are mild paraseptal emphysematous changes in both lung apices. Otherwise negative. Other: None. IMPRESSION: 1. No acute intracranial CT findings or depressed skull fractures. 2. Chronic changes. 3. Osteopenia and degenerative change without evidence of cervical fractures. 4. Minimal grade 1 degenerative anterolisthesis C4-5, C7-T1 and T1-2. 5. Emphysema. Electronically Signed   By: Almira Bar M.D.   On: 10/24/2023 07:44   CT Cervical Spine Wo Contrast Result Date: 10/24/2023 CLINICAL DATA:  Unwitnessed fall, found on floor at his skilled nursing facility, Unknown head trauma, probable neck trauma. EXAM: CT HEAD  WITHOUT CONTRAST CT CERVICAL SPINE WITHOUT CONTRAST TECHNIQUE: Multidetector CT imaging of the head and cervical spine was performed following the standard protocol without intravenous contrast. Multiplanar CT image reconstructions of the cervical spine were also generated. RADIATION DOSE REDUCTION: This exam was performed according to the departmental dose-optimization program which includes automated exposure control, adjustment of the mA and/or kV according to patient size and/or use of iterative reconstruction technique. COMPARISON:  Cervical spine CT 10/06/2023, CT head 10/06/2023 FINDINGS: CT HEAD FINDINGS Brain: There is mild-to-moderate cerebral atrophy, small vessel disease and atrophic ventriculomegaly, without midline shift. No cortical based acute infarct,  hemorrhage or mass effect is seen. Basal cisterns are clear. Vascular: No hyperdense vessel or unexpected calcification. Skull: Negative for fractures or focal lesions. Sinuses/Orbits: Chronic opacification right maxillary sinus. Other sinuses and mastoid air cells are clear. Negative visualized orbits. There has been a previous right inferior nasal turbinectomy. Other: None. CT CERVICAL SPINE FINDINGS Alignment: Minimal grade 1 degenerative anterolisthesis again is noted at C4-5, C7-T1 and T1-2. No traumatic or further listhesis is seen with narrowing and osteophytes of the anterior atlantodental jointagain noted. Skull base and vertebrae: No acute fracture. No primary bone lesion or focal pathologic process. Mild osteopenia. Soft tissues and spinal canal: No prevertebral fluid or swelling. No visible canal hematoma. Disc levels: There is mild disc space loss at C4-5 and C7-T1, more advanced disc space loss with bidirectional endplate osteophytes at C5-6 and C6-7. The other discs are normal in heights. There are no herniated discs or cord compromise. There is multilevel left greater than right facet joint hypertrophy, and uncinate spurring. Acquired foraminal stenosis is mild on the left at C3-4, severe on the left C4-5, bilaterally moderate C5-6, moderate to severe on the right and moderate on the left C6-7. The other foramina are clear. Upper chest: There are mild paraseptal emphysematous changes in both lung apices. Otherwise negative. Other: None. IMPRESSION: 1. No acute intracranial CT findings or depressed skull fractures. 2. Chronic changes. 3. Osteopenia and degenerative change without evidence of cervical fractures. 4. Minimal grade 1 degenerative anterolisthesis C4-5, C7-T1 and T1-2. 5. Emphysema. Electronically Signed   By: Almira Bar M.D.   On: 10/24/2023 07:44   DG Hip Unilat W or Wo Pelvis 2-3 Views Right Result Date: 10/24/2023 CLINICAL DATA:  Found on floor at home. Nonverbal patient.  Deformity in the leg. EXAM: DG HIP (WITH OR WITHOUT PELVIS) 2-3V RIGHT; RIGHT FEMUR 2 VIEWS COMPARISON:  Radiographs 10/06/2023 FINDINGS: Acute oblique fracture of the mid right femur. Apex anterior/lateral angulation. Approximately 10 cm of foreshortening. Intramedullary rod and screw fixation across a healing right intertrochanteric fracture. No radiographic evidence of hardware loosening. The fracture line remains visible. IMPRESSION: Acute oblique displaced, angulated, foreshortened fracture of the mid right femur. Electronically Signed   By: Minerva Fester M.D.   On: 10/24/2023 03:03   DG FEMUR, MIN 2 VIEWS RIGHT Result Date: 10/24/2023 CLINICAL DATA:  Found on floor at home. Nonverbal patient. Deformity in the leg. EXAM: DG HIP (WITH OR WITHOUT PELVIS) 2-3V RIGHT; RIGHT FEMUR 2 VIEWS COMPARISON:  Radiographs 10/06/2023 FINDINGS: Acute oblique fracture of the mid right femur. Apex anterior/lateral angulation. Approximately 10 cm of foreshortening. Intramedullary rod and screw fixation across a healing right intertrochanteric fracture. No radiographic evidence of hardware loosening. The fracture line remains visible. IMPRESSION: Acute oblique displaced, angulated, foreshortened fracture of the mid right femur. Electronically Signed   By: Minerva Fester M.D.   On: 10/24/2023 03:03  Joycelyn Das, MD  [facility] 10/25/2023  If 7PM-7AM, please contact night-coverage

## 2023-10-26 DIAGNOSIS — S7291XA Unspecified fracture of right femur, initial encounter for closed fracture: Secondary | ICD-10-CM | POA: Diagnosis not present

## 2023-10-26 LAB — TYPE AND SCREEN
ABO/RH(D): O POS
Antibody Screen: NEGATIVE
Unit division: 0

## 2023-10-26 LAB — BPAM RBC
Blood Product Expiration Date: 202504172359
ISSUE DATE / TIME: 202503211429
Unit Type and Rh: 5100

## 2023-10-26 LAB — CBC
HCT: 23.5 % — ABNORMAL LOW (ref 39.0–52.0)
Hemoglobin: 8 g/dL — ABNORMAL LOW (ref 13.0–17.0)
MCH: 32.8 pg (ref 26.0–34.0)
MCHC: 34 g/dL (ref 30.0–36.0)
MCV: 96.3 fL (ref 80.0–100.0)
Platelets: 189 10*3/uL (ref 150–400)
RBC: 2.44 MIL/uL — ABNORMAL LOW (ref 4.22–5.81)
RDW: 14 % (ref 11.5–15.5)
WBC: 5.6 10*3/uL (ref 4.0–10.5)
nRBC: 0 % (ref 0.0–0.2)

## 2023-10-26 LAB — MAGNESIUM: Magnesium: 1.7 mg/dL (ref 1.7–2.4)

## 2023-10-26 LAB — BASIC METABOLIC PANEL
Anion gap: 7 (ref 5–15)
BUN: 14 mg/dL (ref 8–23)
CO2: 21 mmol/L — ABNORMAL LOW (ref 22–32)
Calcium: 7.9 mg/dL — ABNORMAL LOW (ref 8.9–10.3)
Chloride: 108 mmol/L (ref 98–111)
Creatinine, Ser: 0.88 mg/dL (ref 0.61–1.24)
GFR, Estimated: >60 mL/min (ref >60)
Glucose, Bld: 111 mg/dL — ABNORMAL HIGH (ref 70–99)
Potassium: 4 mmol/L (ref 3.5–5.1)
Sodium: 136 mmol/L (ref 135–145)

## 2023-10-26 LAB — GLUCOSE, CAPILLARY: Glucose-Capillary: 105 mg/dL — ABNORMAL HIGH (ref 70–99)

## 2023-10-26 MED ORDER — ACETAMINOPHEN 325 MG PO TABS
650.0000 mg | ORAL_TABLET | Freq: Four times a day (QID) | ORAL | Status: AC
  Administered 2023-10-26 – 2023-10-27 (×5): 650 mg via ORAL
  Filled 2023-10-26 (×5): qty 2

## 2023-10-26 NOTE — Plan of Care (Signed)

## 2023-10-26 NOTE — Progress Notes (Signed)
 Patient wife requesting information about in patient rehab. Wife requesting CM to reach out to her on Monday.

## 2023-10-26 NOTE — Plan of Care (Signed)
  Problem: Clinical Measurements: Goal: Will remain free from infection Outcome: Not Progressing   Problem: Clinical Measurements: Goal: Cardiovascular complication will be avoided Outcome: Not Progressing   Problem: Activity: Goal: Risk for activity intolerance will decrease Outcome: Not Progressing   Problem: Safety: Goal: Ability to remain free from injury will improve Outcome: Not Progressing   Problem: Skin Integrity: Goal: Risk for impaired skin integrity will decrease Outcome: Not Progressing

## 2023-10-26 NOTE — Progress Notes (Signed)
 Orthopaedic Trauma Progress Note  S: No response to commands this morning, No family at bedside  O:  Vitals:   10/26/23 0206 10/26/23 0633  BP: 114/65 117/66  Pulse: 98 94  Resp: 18 18  Temp: 98.6 F (37 C) 98.7 F (37.1 C)  SpO2: 100% 100%    NAD, awake and alert, appear comfortable RLE: Dressing in place, removed ace wrap and cast padding, mepilexes clean and dry, compartments soft and compressible. Active motion of toes and ankle. Not cooperative with neuro exam otherwise  Imaging: Stable postop imaging  Labs:  Results for orders placed or performed during the hospital encounter of 10/24/23 (from the past 24 hours)  Prepare RBC (crossmatch)     Status: None   Collection Time: 10/25/23  1:43 PM  Result Value Ref Range   Order Confirmation      ORDER PROCESSED BY BLOOD BANK Performed at [facility], [address], [place], [place] [address]   CBC     Status: Abnormal   Collection Time: 10/25/23  9:53 PM  Result Value Ref Range   WBC 5.0 4.0 - 10.5 K/uL   RBC 2.45 (L) 4.22 - 5.81 MIL/uL   Hemoglobin 8.0 (L) 13.0 - 17.0 g/dL   HCT 24.4 (L) 01.0 - 27.2 %   MCV 95.1 80.0 - 100.0 fL   MCH 32.7 26.0 - 34.0 pg   MCHC 34.3 30.0 - 36.0 g/dL   RDW 53.6 64.4 - 03.4 %   Platelets 180 150 - 400 K/uL   nRBC 0.0 0.0 - 0.2 %  Glucose, capillary     Status: Abnormal   Collection Time: 10/25/23 11:48 PM  Result Value Ref Range   Glucose-Capillary 117 (H) 70 - 99 mg/dL  CBC     Status: Abnormal   Collection Time: 10/26/23  4:40 AM  Result Value Ref Range   WBC 5.6 4.0 - 10.5 K/uL   RBC 2.44 (L) 4.22 - 5.81 MIL/uL   Hemoglobin 8.0 (L) 13.0 - 17.0 g/dL   HCT 74.2 (L) 59.5 - 63.8 %   MCV 96.3 80.0 - 100.0 fL   MCH 32.8 26.0 - 34.0 pg   MCHC 34.0 30.0 - 36.0 g/dL   RDW 75.6 43.3 - 29.5 %   Platelets 189 150 - 400 K/uL   nRBC 0.0 0.0 - 0.2 %  Basic metabolic panel     Status: Abnormal   Collection Time: 10/26/23  4:40 AM  Result Value Ref Range   Sodium 136 135 - 145  mmol/L   Potassium 4.0 3.5 - 5.1 mmol/L   Chloride 108 98 - 111 mmol/L   CO2 21 (L) 22 - 32 mmol/L   Glucose, Bld 111 (H) 70 - 99 mg/dL   BUN 14 8 - 23 mg/dL   Creatinine, Ser 1.88 0.61 - 1.24 mg/dL   Calcium 7.9 (L) 8.9 - 10.3 mg/dL   GFR, Estimated >41 >66 mL/min   Anion gap 7 5 - 15  Magnesium     Status: None   Collection Time: 10/26/23  4:40 AM  Result Value Ref Range   Magnesium 1.7 1.7 - 2.4 mg/dL  Glucose, capillary     Status: Abnormal   Collection Time: 10/26/23  5:14 AM  Result Value Ref Range   Glucose-Capillary 105 (H) 70 - 99 mg/dL    Assessment: 73 yo M w/ periprosthetic femur fracture s/p ORIF   Weightbearing: WBAT with walker  Insicional and dressing care: PRN  Orthopedic  device(s):None  CV/Blood loss: 8.0 this AM, after blood transfusion  Pain management: No narcotics, tylenol only  VTE prophylaxis: Asa 325mg   ID: Postop ancef  Dispo: PT/OT will need new facility upon discharge as patients wife does not want to return to [facility]  Follow - up plan: 2 weeks for x-rays and wound check  Roby Lofts, MD [facility] [phone] (office) [email or web address]

## 2023-10-26 NOTE — Progress Notes (Addendum)
 PROGRESS NOTE  Ernest Campbell HQI:[id number] DOB: [birth year] DOA: 10/24/2023 PCP: Margot Ables, MD (Inactive)   LOS: 2 days   Brief narrative:  Ernest Campbell is a 73 y.o. 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  Patient has cognitive deficits and not able to provide much history.   Patient was recently admitted for right hip fracture and was discharged on 09/30/2023 after patient underwent nailing of the right intertrochanteric femur fracture. In the ED,  x-rays revealed acute oblique displaced angulated foreshortened fracture of the mid right femur.  On-call orthopedic surgeon was consulted.  CT head scan was negative.  CT cervical spine with degenerative changes noted.Labs show hemoglobin of 10.6 creatinine 0.8 platelets 294. EKG shows normal sinus rhythm.  Patient was then admitted to the hospital for further evaluation and treatment.  Assessment/Plan: Principal Problem:   Femur fracture (HCC) Active Problems:   TBI (traumatic brain injury) (HCC)   History of short term memory loss   Macrocytic anemia  Right femur fracture - patient had an unwitnessed fall.  CT head and C-spine negative for acute finding.  Orthopedic was consulted and patient underwent open reduction and internal fixation of right femoral fracture on 10/24/2019.  Patient's wife has requested that patient not to be given any narcotics, muscle relaxants for pain management.  Tylenol is okay.   Orthopedics recommended weightbearing as tolerated with walker.  Orthopedics to follow the patient as outpatient.  Anemia likely secondary to postoperative blood loss.  Received 1 unit of packed RBC for hemoglobin of 7.0.  Hemoglobin today of 8.0.  Hemodynamically stable.  On aspirin 325 milligram for VTE  prophylaxis.  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  Try to avoid narcotics and muscle relaxants due to risk of delirium.  History of CVA  On aspirin at home.  Has been resumed.  Chronic macrocytic anemia Vitamin B12 423, folic acid 11.  Received 1 unit of packed RBC yesterday.  Nutrition Status: Body mass index is 20.92 kg/m.  Nutrition Problem: Severe Malnutrition Etiology: social / environmental circumstances (dementia) Signs/Symptoms: severe muscle depletion, severe fat depletion Interventions: Ensure Enlive (each supplement provides 350kcal and 20 grams of protein), MVI Continue nutritional supplements.  DVT prophylaxis: SCDs Start: 10/24/23 1557 SCDs Start: 10/24/23 0551   Disposition:  Physical therapy has recommended skilled nursing facility placement. .  TOC on board.  Medically stable for disposition.  Status is: Inpatient Remains inpatient appropriate because: Status post hip fracture, need for rehabilitation    Code Status:     Code Status: Full Code  Family Communication:.  Spoke with patient's wife on the phone on 10/25/2023  Consultants: Orthopedics  Procedures: Open reduction and internal fixation of Right femoral shaft fracture on 10/24/2023 by Dr. Jena Gauss. PRBC transfusion 1 unit.  Anti-infectives:  Cefazolin x1  Anti-infectives (From admission, onward)    Start     Dose/Rate Route Frequency Ordered Stop   10/25/23 0200  ceFAZolin (ANCEF) IVPB 2g/100 mL premix        2 g 200 mL/hr over 30 Minutes Intravenous Every 8 hours 10/24/23 2120 10/26/23 0040   10/24/23 1645  ceFAZolin (ANCEF) IVPB 2g/100 mL premix  Status:  Discontinued        2 g 200 mL/hr over 30 Minutes  Intravenous Every 8 hours 10/24/23 1557 10/24/23 2120   10/24/23 1414  vancomycin (VANCOCIN) powder  Status:  Discontinued          As needed 10/24/23 1414 10/24/23 1446   10/24/23 1000  ceFAZolin (ANCEF) IVPB 2g/100 mL  premix        2 g 200 mL/hr over 30 Minutes Intravenous On call to O.R. 10/24/23 0954 10/24/23 1250      Subjective: Today, patient was seen and examined at bedside.  Patient is a poor historian.  Minimally interactive due to underlying cognitive dysfunction.  Denies pain.  Mittens in place.  Objective: Vitals:   10/26/23 0854 10/26/23 1054  BP: 116/67 (!) 131/109  Pulse: 98 88  Resp: 18 18  Temp: 98.2 F (36.8 C) 97.9 F (36.6 C)  SpO2:      Intake/Output Summary (Last 24 hours) at 10/26/2023 1159 Last data filed at 10/25/2023 1630 Gross per 24 hour  Intake 469 ml  Output --  Net 469 ml   Filed Weights   10/24/23 0148 10/24/23 1827  Weight: 58.8 kg 58.8 kg   Body mass index is 20.92 kg/m.   Physical Exam:  GENERAL: Patient is alert awake and mildly interactive, has cognitive impairment, mittens in place.  Oriented to place, mittens in place HENT: No scleral pallor or icterus. Pupils equally reactive to light. Oral mucosa is moist NECK: is supple, no gross swelling noted. CHEST: Clear to auscultation. No crackles or wheezes.   CVS: S1 and S2 heard, no murmur. Regular rate and rhythm.  ABDOMEN: Soft, non-tender, bowel sounds are present. EXTREMITIES: No edema.  Dressing in the right thigh area at the site of surgery. CNS: Moves extremities. SKIN: warm and dry without rashes.  Data Review: I have personally reviewed the following laboratory data and studies,  CBC: Recent Labs  Lab 10/24/23 0301 10/25/23 0556 10/25/23 2153 10/26/23 0440  WBC 11.5* 7.9 5.0 5.6  NEUTROABS 6.9  --   --   --   HGB 10.6* 7.0* 8.0* 8.0*  HCT 32.2* 20.7* 23.3* 23.5*  MCV 100.6* 96.7 95.1 96.3  PLT 294 235 180 189   Basic Metabolic Panel: Recent Labs  Lab 10/24/23 0301 10/25/23 0556 10/26/23 0440  NA 139 135 136  K 4.3 4.2 4.0  CL 107 102 108  CO2 23 23 21*  GLUCOSE 111* 121* 111*  BUN 26* 14 14  CREATININE 0.82 0.83 0.88  CALCIUM 8.6* 7.9* 7.9*  MG  --   --  1.7    Liver Function Tests: Recent Labs  Lab 10/24/23 0301  AST 20  ALT 20  ALKPHOS 133*  BILITOT 0.6  PROT 6.1*  ALBUMIN 3.2*   No results for input(s): "LIPASE", "AMYLASE" in the last 168 hours. No results for input(s): "AMMONIA" in the last 168 hours. Cardiac Enzymes: No results for input(s): "CKTOTAL", "CKMB", "CKMBINDEX", "TROPONINI" in the last 168 hours. BNP (last 3 results) No results for input(s): "BNP" in the last 8760 hours.  ProBNP (last 3 results) No results for input(s): "PROBNP" in the last 8760 hours.  CBG: Recent Labs  Lab 10/24/23 1655 10/24/23 2356 10/25/23 0556 10/25/23 2348 10/26/23 0514  GLUCAP 117* 126* 110* 117* 105*   No results found for this or any previous visit (from the past 240 hours).   Studies: DG FEMUR, MIN 2 VIEWS RIGHT Result Date: 10/24/2023 CLINICAL DATA:  Elective surgery. EXAM: RIGHT FEMUR 2 VIEWS COMPARISON:  Preoperative imaging FINDINGS: 11 fluoroscopic spot views of the  right femur submitted from the operating room. Lateral plate and screw fixation as well as cerclage wires traverse femoral shaft fracture. Previous proximal femoral hardware. Fluoroscopy time 56 seconds. Dose 4.82 mGy. IMPRESSION: Intraoperative fluoroscopy during femoral shaft fracture fixation. Electronically Signed   By: Narda Rutherford M.D.   On: 10/24/2023 15:55   DG FEMUR PORT, MIN 2 VIEWS RIGHT Result Date: 10/24/2023 CLINICAL DATA:  Fracture, postop. EXAM: RIGHT FEMUR PORTABLE 2 VIEW COMPARISON:  Preoperative imaging earlier today FINDINGS: Lateral plate and screw fixation is well as cerclage wires traversing femoral shaft fracture. Previous right proximal femoral hardware remains in place. Recent postsurgical change includes air and edema in the soft tissues. IMPRESSION: ORIF of right femoral shaft fracture. Electronically Signed   By: Narda Rutherford M.D.   On: 10/24/2023 15:53   DG C-Arm 1-60 Min-No Report Result Date: 10/24/2023 Fluoroscopy was utilized  by the requesting physician.  No radiographic interpretation.   DG C-Arm 1-60 Min-No Report Result Date: 10/24/2023 Fluoroscopy was utilized by the requesting physician.  No radiographic interpretation.      Joycelyn Das, MD  [facility] 10/26/2023  If 7PM-7AM, please contact night-coverage

## 2023-10-27 DIAGNOSIS — S7291XA Unspecified fracture of right femur, initial encounter for closed fracture: Secondary | ICD-10-CM | POA: Diagnosis not present

## 2023-10-27 LAB — GLUCOSE, CAPILLARY: Glucose-Capillary: 104 mg/dL — ABNORMAL HIGH (ref 70–99)

## 2023-10-27 MED ORDER — ACETAMINOPHEN 325 MG PO TABS: 650.0000 mg | ORAL_TABLET | Freq: Four times a day (QID) | ORAL | Status: DC

## 2023-10-27 MED ORDER — LIDOCAINE 5 % EX PTCH
1.0000 | MEDICATED_PATCH | CUTANEOUS | Status: AC
  Administered 2023-10-27 – 2023-10-28 (×2): 1 via TRANSDERMAL
  Filled 2023-10-27 (×2): qty 1

## 2023-10-27 MED ORDER — ACETAMINOPHEN 325 MG PO TABS: 650.0000 mg | ORAL_TABLET | Freq: Four times a day (QID) | ORAL | Status: DC | PRN

## 2023-10-27 MED ORDER — ACETAMINOPHEN 500 MG PO TABS
1000.0000 mg | ORAL_TABLET | Freq: Four times a day (QID) | ORAL | Status: AC
  Administered 2023-10-27 – 2023-10-29 (×6): 1000 mg via ORAL
  Filled 2023-10-27 (×6): qty 2

## 2023-10-27 NOTE — TOC Progression Note (Addendum)
 Transition of Care [facility]) - Initial/Assessment Note    Patient Details  Name: Ernest Campbell MRN: [id number] Date of Birth: October 19, 1950  [facility]) CM/SW Contact:    Ralene Bathe, LCSW Phone Number: 10/27/2023, 9:17 AM  Clinical Narrative:                 CSW LM for admissions at [facility] inquiring as to whether they can accept the patient and is awaiting a returned call.    16:00-  CSW spoke with admissions at [facility].  The facility is unable to accept the patient at this time.  Patient's spouse notified and provided with list of facilities that extended bed offers.    Pending facility choice.   TOC will continue to follow.  Expected Discharge Plan: Skilled Nursing Facility Barriers to Discharge: Continued Medical Work up, SNF Pending bed offer   Patient Goals and CMS Choice   CMS Medicare.gov Compare Post Acute Care list provided to:: Patient Represenative (must comment) (wife [name]) Choice offered to / list presented to : Spouse      Expected Discharge Plan and Services In-house Referral: Clinical Social Work   Post Acute Care Choice: Skilled Nursing Facility Living arrangements for the past 2 months: Skilled Nursing Facility (has been at [facility] several times, normally from home)                                      Prior Living Arrangements/Services Living arrangements for the past 2 months: Skilled Holiday representative (has been at [facility] several times, normally from home) Lives with:: Spouse Patient language and need for interpreter reviewed:: No        Need for Family Participation in Patient Care: Yes (Comment) Care giver support system in place?: Yes (comment) Current home services: Other (comment) (none) Criminal Activity/Legal Involvement Pertinent to Current Situation/Hospitalization: No - Comment as needed  Activities of Daily Living   ADL Screening (condition at time of admission) Independently performs ADLs?:  No Does the patient have a NEW difficulty with bathing/dressing/toileting/self-feeding that is expected to last >3 days?: No Does the patient have a NEW difficulty with getting in/out of bed, walking, or climbing stairs that is expected to last >3 days?: No Does the patient have a NEW difficulty with communication that is expected to last >3 days?: No Is the patient deaf or have difficulty hearing?: Yes Does the patient have difficulty seeing, even when wearing glasses/contacts?: Yes Does the patient have difficulty concentrating, remembering, or making decisions?: Yes  Permission Sought/Granted                  Emotional Assessment Appearance:: Appears stated age Attitude/Demeanor/Rapport: Unable to Assess Affect (typically observed): Unable to Assess Orientation: : Oriented to Self      Admission diagnosis:  Femur fracture (HCC) [S72.90XA] Closed displaced oblique fracture of shaft of right femur, initial encounter (HCC) [S72.331A] Patient Active Problem List   Diagnosis Date Noted   Femur fracture (HCC) 10/24/2023   Protein-calorie malnutrition, severe 09/29/2023   Closed right hip fracture, initial encounter (HCC) 09/25/2023   Cervical spondylosis 09/25/2023   Paraparesis (HCC) 09/25/2023   Macrocytic anemia 09/25/2023   Hypocalcemia 09/25/2023   Altered mental status 07/21/2023   CVA (cerebral vascular accident) (HCC) 07/18/2023   Shuffling gait 07/03/2021   Episodes of formed visual hallucinations 07/03/2021   Nocturnal enuresis 07/03/2021   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   TBI (traumatic brain injury) (HCC) 07/03/2021   History of short term memory loss 07/03/2021   PCP:  Margot Ables, MD (Inactive) Pharmacy:   [facility] [id number] - [place], [place] - [address] [place] [address] Phone: [phone] Fax: [phone]     Social Drivers of Health  (SDOH) Social History: SDOH Screenings   Food Insecurity: No Food Insecurity (10/24/2023)  Housing: Low Risk  (10/24/2023)  Transportation Needs: No Transportation Needs (10/24/2023)  Utilities: Not At Risk (10/24/2023)  Financial Resource Strain: Low Risk  (10/20/2022)   Received from [facility], [facility]  Physical Activity: Unknown (10/20/2022)   Received from [facility], [facility]  Social Connections: Moderately Isolated (10/24/2023)  Stress: Patient Declined (10/20/2022)   Received from [facility], [facility]  Tobacco Use: High Risk (10/24/2023)   SDOH Interventions:     Readmission Risk Interventions     No data to display

## 2023-10-27 NOTE — Progress Notes (Signed)
 PROGRESS NOTE  Ernest Campbell ZOX:[id number] DOB: 02/11/1951 DOA: 10/24/2023 PCP: Margot Ables, MD (Inactive)   LOS: 3 days   Brief narrative:  Ernest Campbell is a 73 y.o. 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  Patient has cognitive deficits and not able to provide much history.   Patient was recently admitted for right hip fracture and was discharged on 09/30/2023 after patient underwent nailing of the right intertrochanteric femur fracture. In the ED,  x-rays revealed acute oblique displaced angulated foreshortened fracture of the mid right femur.  On-call orthopedic surgeon was consulted.  CT head scan was negative.  CT cervical spine with degenerative changes noted.Labs show hemoglobin of 10.6 creatinine 0.8 platelets 294. EKG shows normal sinus rhythm.  Patient was then admitted to the hospital for further evaluation and treatment.  Assessment/Plan: Principal Problem:   Femur fracture (HCC) Active Problems:   TBI (traumatic brain injury) (HCC)   History of short term memory loss   Macrocytic anemia  Right femur fracture - patient had an unwitnessed fall.  CT head and C-spine negative for acute finding.  Orthopedic was consulted and patient underwent open reduction and internal fixation of right femoral fracture on 10/24/2019.  Patient's wife has requested that patient not to be given any narcotics, muscle relaxants for pain management.  Tylenol is okay for pain relief.   Orthopedics recommended weightbearing as tolerated with walker.  Orthopedics to follow the patient as outpatient after discharge.  Anemia likely secondary to postoperative blood loss.  Received 1 unit of packed RBC for hemoglobin of 7.0.  Latest hemoglobin at 8.0.  Hemodynamically stable.  On aspirin 325  milligram for VTE prophylaxis.  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  Try to avoid narcotics and muscle relaxants due to risk of delirium.  History of CVA  On aspirin at home.  Continue while in the hospital.  Chronic macrocytic anemia Vitamin B12 423, folic acid 11.  Received 1 unit of packed RBC during hospitalization.  Latest hemoglobin 8.0.  Nutrition Status: Body mass index is 20.92 kg/m.  Nutrition Problem: Severe Malnutrition Etiology: social / environmental circumstances (dementia) Signs/Symptoms: severe muscle depletion, severe fat depletion Interventions: Ensure Enlive (each supplement provides 350kcal and 20 grams of protein), MVI Continue nutritional supplements.  DVT prophylaxis: SCDs Start: 10/24/23 1557 SCDs Start: 10/24/23 0551   Disposition:  Physical therapy has recommended skilled nursing facility placement. .  TOC on board.  Medically stable for disposition.  Status is: Inpatient Remains inpatient appropriate because: Status post hip fracture, need for rehabilitation    Code Status:     Code Status: Full Code  Family Communication:.  Spoke with patient's wife on the phone on 10/25/2023  Consultants: Orthopedics  Procedures: Open reduction and internal fixation of Right femoral shaft fracture on 10/24/2023 by Dr. Jena Gauss. PRBC transfusion 1 unit.  Anti-infectives:  Cefazolin x1  Anti-infectives (From admission, onward)    Start     Dose/Rate Route Frequency Ordered Stop   10/25/23 0200  ceFAZolin (ANCEF) IVPB 2g/100 mL premix        2 g 200 mL/hr over 30 Minutes Intravenous Every 8 hours 10/24/23 2120 10/26/23 0040   10/24/23 1645  ceFAZolin (ANCEF) IVPB 2g/100 mL premix  Status:  Discontinued  2 g 200 mL/hr over 30 Minutes Intravenous Every 8 hours 10/24/23 1557 10/24/23 2120   10/24/23 1414  vancomycin (VANCOCIN) powder  Status:  Discontinued          As needed 10/24/23  1414 10/24/23 1446   10/24/23 1000  ceFAZolin (ANCEF) IVPB 2g/100 mL premix        2 g 200 mL/hr over 30 Minutes Intravenous On call to O.R. 10/24/23 0954 10/24/23 1250      Subjective:  Today, patient was seen and examined at bedside.  He is a poor story but is much more alert and interactive today and answering some yes and no questions. Objective: Vitals:   10/27/23 0459 10/27/23 0755  BP: (!) 129/57 (!) 126/59  Pulse: 87 86  Resp: 20 18  Temp: 98.4 F (36.9 C) 98.4 F (36.9 C)  SpO2: 100%    No intake or output data in the 24 hours ending 10/27/23 1047  Filed Weights   10/24/23 0148 10/24/23 1827  Weight: 58.8 kg 58.8 kg   Body mass index is 20.92 kg/m.   Physical Exam:  GENERAL: Patient is alert awake and mildly interactive, has cognitive impairment, mittens in place.  Oriented to place, mittens in left hand. HENT: No scleral pallor or icterus. Pupils equally reactive to light. Oral mucosa is moist NECK: is supple, no gross swelling noted. CHEST: Clear to auscultation. No crackles or wheezes.   CVS: S1 and S2 heard, no murmur. Regular rate and rhythm.  ABDOMEN: Soft, non-tender, bowel sounds are present. EXTREMITIES: No edema.  Dressing in the right thigh area at the site of surgery. CNS: Moves extremities. SKIN: warm and dry without rashes.  Data Review: I have personally reviewed the following laboratory data and studies,  CBC: Recent Labs  Lab 10/24/23 0301 10/25/23 0556 10/25/23 2153 10/26/23 0440  WBC 11.5* 7.9 5.0 5.6  NEUTROABS 6.9  --   --   --   HGB 10.6* 7.0* 8.0* 8.0*  HCT 32.2* 20.7* 23.3* 23.5*  MCV 100.6* 96.7 95.1 96.3  PLT 294 235 180 189   Basic Metabolic Panel: Recent Labs  Lab 10/24/23 0301 10/25/23 0556 10/26/23 0440  NA 139 135 136  K 4.3 4.2 4.0  CL 107 102 108  CO2 23 23 21*  GLUCOSE 111* 121* 111*  BUN 26* 14 14  CREATININE 0.82 0.83 0.88  CALCIUM 8.6* 7.9* 7.9*  MG  --   --  1.7   Liver Function Tests: Recent  Labs  Lab 10/24/23 0301  AST 20  ALT 20  ALKPHOS 133*  BILITOT 0.6  PROT 6.1*  ALBUMIN 3.2*   No results for input(s): "LIPASE", "AMYLASE" in the last 168 hours. No results for input(s): "AMMONIA" in the last 168 hours. Cardiac Enzymes: No results for input(s): "CKTOTAL", "CKMB", "CKMBINDEX", "TROPONINI" in the last 168 hours. BNP (last 3 results) No results for input(s): "BNP" in the last 8760 hours.  ProBNP (last 3 results) No results for input(s): "PROBNP" in the last 8760 hours.  CBG: Recent Labs  Lab 10/24/23 1655 10/24/23 2356 10/25/23 0556 10/25/23 2348 10/26/23 0514  GLUCAP 117* 126* 110* 117* 105*   No results found for this or any previous visit (from the past 240 hours).   Studies: No results found.     Joycelyn Das, MD  [facility] 10/27/2023  If 7PM-7AM, please contact night-coverage

## 2023-10-28 DIAGNOSIS — S7291XA Unspecified fracture of right femur, initial encounter for closed fracture: Secondary | ICD-10-CM | POA: Diagnosis not present

## 2023-10-28 LAB — GLUCOSE, CAPILLARY
Glucose-Capillary: 180 mg/dL — ABNORMAL HIGH (ref 70–99)
Glucose-Capillary: 129 mg/dL — ABNORMAL HIGH (ref 70–99)

## 2023-10-28 NOTE — TOC Progression Note (Addendum)
 Transition of Care [facility]) - Progression Note    Patient Details  Name: Ernest Campbell MRN: [id number] Date of Birth: 1950/09/05  [facility]) CM/SW Contact  Lorri Frederick, LCSW Phone Number: 10/28/2023, 11:02 AM  Clinical Narrative:   CSW spoke with pt wife [name].  She is requesting pt be considered for [facility].  If that is not option, she would like to accept offer at [facility].  CSW reached out to [facility].  1050: [facility] cannot accept.  CSW spoke with [facility]: they will not have available bed until the end of the week.    1400: CSW informed wife that no beds available at [facility], discussed other offers.  She is requesting  more options.  CSW reached out to [facility], [facility] to review.  1500: [facility] will not have available beds until next week.    Expected Discharge Plan: Skilled Nursing Facility Barriers to Discharge: Continued Medical Work up, SNF Pending bed offer  Expected Discharge Plan and Services In-house Referral: Clinical Social Work   Post Acute Care Choice: Skilled Nursing Facility Living arrangements for the past 2 months: Skilled Nursing Facility (has been at [facility] several times, normally from home)                                       Social Determinants of Health (SDOH) Interventions SDOH Screenings   Food Insecurity: No Food Insecurity (10/24/2023)  Housing: Low Risk  (10/24/2023)  Transportation Needs: No Transportation Needs (10/24/2023)  Utilities: Not At Risk (10/24/2023)  Financial Resource Strain: Low Risk  (10/20/2022)   Received from [facility], [facility]  Physical Activity: Unknown (10/20/2022)   Received from [facility], [facility]  Social Connections: Moderately Isolated (10/24/2023)  Stress: Patient Declined (10/20/2022)   Received from [facility], [facility]  Tobacco Use: High Risk (10/24/2023)    Readmission Risk Interventions     No data to display

## 2023-10-28 NOTE — Progress Notes (Signed)
 Physical Therapy Treatment Patient Details Name: Ernest Campbell MRN: [id number] DOB: 1950/10/03 Today's Date: 10/28/2023   History of Present Illness 73 yo male presents to [facility] on 3/20 s/p fall at [facility] with + R hip pain. Pt sustained R periprosthetic femur fx, s/p ORIF 3/20. Recent admissions for agitation 2/7 and R hip ORIF 09/26/23. PMH includes recent CVA, TBI, dementia, and parkinsonism.    PT Comments  Pt muttering to self upon PT arrival to room, mostly unintelligibly. Pt noted to be soiled in urine, PT assisting with clean up requiring mod assist for rolling L/R multiple times. Pt initially resistant to all PT assist, benefits from increased time and max cuing to increase awareness. Pt requiring max assist for to/from EOB, pt declines transfer into and standing and attempts to lay self back down immediately. Pt resistant to LE exercises. PT to continue to follow.     If plan is discharge home, recommend the following: Two people to help with walking and/or transfers;Two people to help with bathing/dressing/bathroom;Assistance with feeding;Direct supervision/assist for medications management   Can travel by private vehicle        Equipment Recommendations  None recommended by PT    Recommendations for Other Services       Precautions / Restrictions Precautions Precautions: Fall Recall of Precautions/Restrictions: Impaired Restrictions Weight Bearing Restrictions Per Provider Order: No Other Position/Activity Restrictions: RLE WBAT with RW     Mobility  Bed Mobility Overal bed mobility: Needs Assistance Bed Mobility: Rolling, Sidelying to Sit, Sit to Sidelying Rolling: Mod assist Sidelying to sit: Max assist     Sit to sidelying: Max assist General bed mobility comments: assist for multiple rolls bilat, trunk elevation/lowering, and boost up in bed.    Transfers                   General transfer comment: pt resistant, declines     Ambulation/Gait                   Stairs             Wheelchair Mobility     Tilt Bed    Modified Rankin (Stroke Patients Only)       Balance Overall balance assessment: Needs assistance Sitting-balance support: Feet supported, Bilateral upper extremity supported Sitting balance-Leahy Scale: Poor Sitting balance - Comments: R and posterior bias Postural control: Right lateral lean                                  Communication Communication Communication: Impaired Factors Affecting Communication: Difficulty expressing self;Reduced clarity of speech  Cognition Arousal: Alert Behavior During Therapy: Restless                           PT - Cognition Comments: history of dementia, TBI, CVA. initially resistant to mobility and PT assist, benefits from max cues and increased time to process Following commands: Impaired Following commands impaired: Follows one step commands with increased time, Follows one step commands inconsistently    Cueing Cueing Techniques: Verbal cues, Gestural cues, Tactile cues  Exercises      General Comments        Pertinent Vitals/Pain Pain Assessment Pain Assessment: Faces Faces Pain Scale: Hurts even more Pain Location: RLE Pain Descriptors / Indicators: Grimacing, Guarding, Moaning Pain Intervention(s): Limited activity within patient's tolerance, Monitored during session, Repositioned  Home Living                          Prior Function            PT Goals (current goals can now be found in the care plan section) Acute Rehab PT Goals PT Goal Formulation: With patient Time For Goal Achievement: 11/08/23 Potential to Achieve Goals: Good Progress towards PT goals: Progressing toward goals    Frequency    Min 3X/week      PT Plan      Co-evaluation              AM-PAC PT "6 Clicks" Mobility   Outcome Measure  Help needed turning from your back to your  side while in a flat bed without using bedrails?: A Lot Help needed moving from lying on your back to sitting on the side of a flat bed without using bedrails?: Total Help needed moving to and from a bed to a chair (including a wheelchair)?: Total Help needed standing up from a chair using your arms (e.g., wheelchair or bedside chair)?: Total Help needed to walk in hospital room?: Total Help needed climbing 3-5 steps with a railing? : Total 6 Click Score: 7    End of Session   Activity Tolerance: Patient limited by pain;Patient limited by fatigue Patient left: in bed;with call bell/phone within reach;with bed alarm set (all fall pads down) Nurse Communication: Mobility status PT Visit Diagnosis: Other abnormalities of gait and mobility (R26.89);Muscle weakness (generalized) (M62.81)     Time: 9811-9147 PT Time Calculation (min) (ACUTE ONLY): 15 min  Charges:    $Therapeutic Activity: 8-22 mins PT General Charges $$ ACUTE PT VISIT: 1 Visit                     Marye Round, PT DPT Acute Rehabilitation Services Secure Chat Preferred  Office [phone]    Mayco Walrond Sheliah Plane 10/28/2023, 10:57 AM

## 2023-10-28 NOTE — Plan of Care (Signed)
   Problem: Nutrition: Goal: Adequate nutrition will be maintained Outcome: Progressing   Problem: Pain Managment: Goal: General experience of comfort will improve and/or be controlled Outcome: Progressing   Problem: Safety: Goal: Ability to remain free from injury will improve Outcome: Progressing

## 2023-10-28 NOTE — Progress Notes (Signed)
 PROGRESS NOTE  Ernest Campbell DOB: 06/22/1951 DOA: 10/24/2023 PCP: Margot Ables, MD (Inactive)   LOS: 4 days   Brief narrative:  Ernest Campbell is a 73 y.o. 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  Patient has cognitive deficits and not able to provide much history.   Patient was recently admitted for right hip fracture and was discharged on 09/30/2023 after patient underwent nailing of the right intertrochanteric femur fracture. In the ED,  x-rays revealed acute oblique displaced angulated foreshortened fracture of the mid right femur.  On-call orthopedic surgeon was consulted.  CT head scan was negative.  CT cervical spine with degenerative changes noted. Labs show hemoglobin of 10.6 creatinine 0.8 platelets 294. EKG shows normal sinus rhythm.  Patient was then admitted to the hospital for further evaluation and treatment.  Assessment/Plan: Principal Problem:   Femur fracture (HCC) Active Problems:   TBI (traumatic brain injury) (HCC)   History of short term memory loss   Macrocytic anemia  Right femur fracture - patient had an unwitnessed fall.  CT head and C-spine negative for acute finding.  Orthopedics was consulted and patient underwent open reduction and internal fixation of right femoral fracture on 10/24/2019.  Patient's wife has requested that patient not to be given any narcotics, muscle relaxants for pain management.  Tylenol is okay for pain relief.  Tylenol has been increased to 1 g every 6 hourly at the request of the family.  Orthopedics recommended weightbearing as tolerated with walker.  Orthopedics to follow the patient as outpatient after discharge.  Anemia likely secondary to postoperative blood loss.  Received 1 unit of packed RBC for hemoglobin  of 7.0.  Latest hemoglobin at 8.0.  Hemodynamically stable.  On aspirin 325 milligram for VTE prophylaxis.  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  Try to avoid narcotics and muscle relaxants due to risk of delirium.  History of CVA  On aspirin at home.  Continue while in the hospital.  Chronic macrocytic anemia Vitamin B12 423, folic acid 11.  Received 1 unit of packed RBC during hospitalization.  Latest hemoglobin 8.0.  Nutrition Status: Body mass index is 20.92 kg/m.  Nutrition Problem: Severe Malnutrition Etiology: social / environmental circumstances (dementia) Signs/Symptoms: severe muscle depletion, severe fat depletion Interventions: Ensure Enlive (each supplement provides 350kcal and 20 grams of protein), MVI Continue nutritional supplements.  DVT prophylaxis: SCDs Start: 10/24/23 1557 SCDs Start: 10/24/23 0551   Disposition:  Physical therapy has recommended skilled nursing facility placement. .  TOC on board.  Medically stable for disposition.  Status is: Inpatient Remains inpatient appropriate because: Status post hip fracture, need for rehabilitation    Code Status:     Code Status: Full Code  Family Communication:.  Spoke with patient's wife on the phone on 10/25/2023  Consultants: Orthopedics  Procedures: Open reduction and internal fixation of Right femoral shaft fracture on 10/24/2023 by Dr. Jena Gauss. PRBC transfusion 1 unit.  Anti-infectives:  None currently  Subjective:  Today, patient was seen and examined at bedside.  Poor historian.  Alert awake and mildly interactive.  Objective: Vitals:   10/28/23 0741 10/28/23 0745  BP: (!) 111/55 (!) 111/55  Pulse: 81 61  Resp: 18 18  Temp: 97.7 F (36.5 C) 97.7 F (  36.5 C)  SpO2: 99% 99%    Intake/Output Summary (Last 24 hours) at 10/28/2023 1003 Last data filed at 10/27/2023 1700 Gross per 24 hour  Intake 360 ml  Output --  Net 360  ml    Filed Weights   10/24/23 0148 10/24/23 1827  Weight: 58.8 kg 58.8 kg   Body mass index is 20.92 kg/m.   Physical Exam:  GENERAL: Patient is alert awake and mildly interactive, has cognitive impairment,   Oriented to place HENT: No scleral pallor or icterus. Pupils equally reactive to light. Oral mucosa is moist NECK: is supple, no gross swelling noted. CHEST: Clear to auscultation. No crackles or wheezes.   CVS: S1 and S2 heard, no murmur. Regular rate and rhythm.  ABDOMEN: Soft, non-tender, bowel sounds are present. EXTREMITIES: No edema.  Dressing in the right thigh area at the site of surgery. CNS: Moves extremities. SKIN: warm and dry without rashes.  Data Review: I have personally reviewed the following laboratory data and studies,  CBC: Recent Labs  Lab 10/24/23 0301 10/25/23 0556 10/25/23 2153 10/26/23 0440  WBC 11.5* 7.9 5.0 5.6  NEUTROABS 6.9  --   --   --   HGB 10.6* 7.0* 8.0* 8.0*  HCT 32.2* 20.7* 23.3* 23.5*  MCV 100.6* 96.7 95.1 96.3  PLT 294 235 180 189   Basic Metabolic Panel: Recent Labs  Lab 10/24/23 0301 10/25/23 0556 10/26/23 0440  NA 139 135 136  K 4.3 4.2 4.0  CL 107 102 108  CO2 23 23 21*  GLUCOSE 111* 121* 111*  BUN 26* 14 14  CREATININE 0.82 0.83 0.88  CALCIUM 8.6* 7.9* 7.9*  MG  --   --  1.7   Liver Function Tests: Recent Labs  Lab 10/24/23 0301  AST 20  ALT 20  ALKPHOS 133*  BILITOT 0.6  PROT 6.1*  ALBUMIN 3.2*   No results for input(s): "LIPASE", "AMYLASE" in the last 168 hours. No results for input(s): "AMMONIA" in the last 168 hours. Cardiac Enzymes: No results for input(s): "CKTOTAL", "CKMB", "CKMBINDEX", "TROPONINI" in the last 168 hours. BNP (last 3 results) No results for input(s): "BNP" in the last 8760 hours.  ProBNP (last 3 results) No results for input(s): "PROBNP" in the last 8760 hours.  CBG: Recent Labs  Lab 10/24/23 2356 10/25/23 0556 10/25/23 2348 10/26/23 0514 10/27/23 1125  GLUCAP 126*  110* 117* 105* 104*   No results found for this or any previous visit (from the past 240 hours).   Studies: No results found.     Joycelyn Das, MD  [facility] 10/28/2023  If 7PM-7AM, please contact night-coverage

## 2023-10-28 NOTE — Plan of Care (Signed)
  Problem: Clinical Measurements: Goal: Ability to maintain clinical measurements within normal limits will improve Outcome: Progressing Goal: Will remain free from infection Outcome: Progressing Goal: Diagnostic test results will improve Outcome: Progressing Goal: Respiratory complications will improve Outcome: Progressing Goal: Cardiovascular complication will be avoided Outcome: Progressing   Problem: Coping: Goal: Level of anxiety will decrease Outcome: Progressing   Problem: Elimination: Goal: Will not experience complications related to bowel motility Outcome: Progressing Goal: Will not experience complications related to urinary retention Outcome: Progressing   Problem: Pain Managment: Goal: General experience of comfort will improve and/or be controlled Outcome: Progressing   Problem: Safety: Goal: Ability to remain free from injury will improve Outcome: Progressing   Problem: Skin Integrity: Goal: Risk for impaired skin integrity will decrease Outcome: Progressing   Problem: Education: Goal: Knowledge of General Education information will improve Description: Including pain rating scale, medication(s)/side effects and non-pharmacologic comfort measures Outcome: Not Progressing   Problem: Health Behavior/Discharge Planning: Goal: Ability to manage health-related needs will improve Outcome: Not Progressing   Problem: Activity: Goal: Risk for activity intolerance will decrease Outcome: Not Progressing   Problem: Nutrition: Goal: Adequate nutrition will be maintained Outcome: Not Progressing

## 2023-10-28 NOTE — Progress Notes (Signed)
 Inpatient Rehab Admissions Coordinator:   Per [facility] and family request, patient was screened for CIR candidacy by Megan Salon, MS, CCC-SLP. At this time, Pt. does not appear to demonstrate medical necessity to justify in hospital rehabilitation/CIR and is not at a level to tolerate the intensity of CIR. I will not pursue a rehab consult for this Pt.   Recommend other rehab venues to be pursued.  Please contact me with any questions.  Megan Salon, MS, CCC-SLP Rehab Admissions Coordinator  [phone] (celll) [phone] (office)

## 2023-10-29 DIAGNOSIS — S7291XA Unspecified fracture of right femur, initial encounter for closed fracture: Secondary | ICD-10-CM | POA: Diagnosis not present

## 2023-10-29 LAB — BASIC METABOLIC PANEL
Anion gap: 9 (ref 5–15)
BUN: 11 mg/dL (ref 8–23)
CO2: 26 mmol/L (ref 22–32)
Calcium: 9 mg/dL (ref 8.9–10.3)
Chloride: 105 mmol/L (ref 98–111)
Creatinine, Ser: 0.82 mg/dL (ref 0.61–1.24)
GFR, Estimated: >60 mL/min (ref >60)
Glucose, Bld: 108 mg/dL — ABNORMAL HIGH (ref 70–99)
Potassium: 4.9 mmol/L (ref 3.5–5.1)
Sodium: 140 mmol/L (ref 135–145)

## 2023-10-29 LAB — CBC
HCT: 29.1 % — ABNORMAL LOW (ref 39.0–52.0)
Hemoglobin: 9.7 g/dL — ABNORMAL LOW (ref 13.0–17.0)
MCH: 32.2 pg (ref 26.0–34.0)
MCHC: 33.3 g/dL (ref 30.0–36.0)
MCV: 96.7 fL (ref 80.0–100.0)
Platelets: 357 10*3/uL (ref 150–400)
RBC: 3.01 MIL/uL — ABNORMAL LOW (ref 4.22–5.81)
RDW: 13.2 % (ref 11.5–15.5)
WBC: 9 10*3/uL (ref 4.0–10.5)
nRBC: 0 % (ref 0.0–0.2)

## 2023-10-29 MED ORDER — ADULT MULTIVITAMIN W/MINERALS CH: 1.0000 | ORAL_TABLET | Freq: Every day | ORAL | Status: DC

## 2023-10-29 MED ORDER — DOCUSATE SODIUM 100 MG PO CAPS: 100.0000 mg | ORAL_CAPSULE | Freq: Every day | ORAL | Status: DC

## 2023-10-29 MED ORDER — ENSURE ENLIVE PO LIQD: 237.0000 mL | Freq: Two times a day (BID) | ORAL | Status: DC

## 2023-10-29 MED ORDER — ACETAMINOPHEN 500 MG PO TABS
1000.0000 mg | ORAL_TABLET | Freq: Once | ORAL | Status: AC
  Administered 2023-10-29: 1000 mg via ORAL
  Filled 2023-10-29: qty 2

## 2023-10-29 NOTE — TOC Progression Note (Addendum)
 Transition of Care [facility]) - Progression Note    Patient Details  Name: Ernest Campbell MRN: [id number] Date of Birth: 02-07-1951  [facility]) CM/SW Contact  Lorri Frederick, LCSW Phone Number: 10/29/2023, 11:50 AM  Clinical Narrative:   CSW spoke with pt wife [name], updated her that no new bed offers.  She would like to accept offer at [facility].    [facility] confirmed that they can receive pt today. MD informed.     Expected Discharge Plan: Skilled Nursing Facility Barriers to Discharge: Continued Medical Work up, SNF Pending bed offer  Expected Discharge Plan and Services In-house Referral: Clinical Social Work   Post Acute Care Choice: Skilled Nursing Facility Living arrangements for the past 2 months: Skilled Nursing Facility (has been at [facility] several times, normally from home)                                       Social Determinants of Health (SDOH) Interventions SDOH Screenings   Food Insecurity: No Food Insecurity (10/24/2023)  Housing: Low Risk  (10/24/2023)  Transportation Needs: No Transportation Needs (10/24/2023)  Utilities: Not At Risk (10/24/2023)  Financial Resource Strain: Low Risk  (10/20/2022)   Received from [facility], [facility]  Physical Activity: Unknown (10/20/2022)   Received from [facility], [facility]  Social Connections: Moderately Isolated (10/24/2023)  Stress: Patient Declined (10/20/2022)   Received from [facility], [facility]  Tobacco Use: High Risk (10/24/2023)    Readmission Risk Interventions     No data to display

## 2023-10-29 NOTE — TOC Transition Note (Signed)
 Transition of Care [facility]) - Discharge Note   Patient Details  Name: Ernest Campbell MRN: [id number] Date of Birth: 01-25-1951  Transition of [facility]) CM/SW Contact:  Lorri Frederick, LCSW Phone Number: 10/29/2023, 2:19 PM   Clinical Narrative:   Pt discharging to [facility].  RN  call report to [phone].  PTAR called [phone].    Final next level of care: Skilled Nursing Facility Barriers to Discharge: Barriers Resolved   Patient Goals and CMS Choice   CMS Medicare.gov Compare Post Acute Care list provided to:: Patient Represenative (must comment) (wife [name]) Choice offered to / list presented to : Spouse      Discharge Placement              Patient chooses bed at:  [facility] [place]) Patient to be transferred to facility by: ptar Name of family member notified: wife [name] Patient and family notified of of transfer: 10/29/23  Discharge Plan and Services Additional resources added to the After Visit Summary for   In-house Referral: Clinical Social Work   Post Acute Care Choice: Skilled Nursing Facility                               Social Drivers of Health (SDOH) Interventions SDOH Screenings   Food Insecurity: No Food Insecurity (10/24/2023)  Housing: Low Risk  (10/24/2023)  Transportation Needs: No Transportation Needs (10/24/2023)  Utilities: Not At Risk (10/24/2023)  Financial Resource Strain: Low Risk  (10/20/2022)   Received from [facility], [facility]  Physical Activity: Unknown (10/20/2022)   Received from [facility], [facility]  Social Connections: Moderately Isolated (10/24/2023)  Stress: Patient Declined (10/20/2022)   Received from [facility], [facility]  Tobacco Use: High Risk (10/24/2023)     Readmission Risk Interventions     No data to display

## 2023-10-29 NOTE — Plan of Care (Signed)
   Problem: Elimination: Goal: Will not experience complications related to bowel motility Outcome: Progressing Goal: Will not experience complications related to urinary retention Outcome: Progressing   Problem: Pain Managment: Goal: General experience of comfort will improve and/or be controlled Outcome: Progressing   Problem: Safety: Goal: Ability to remain free from injury will improve Outcome: Progressing

## 2023-10-29 NOTE — Discharge Summary (Signed)
 Physician Discharge Summary  Ernest Campbell:[id number] DOB: Dec 07, 1950 DOA: 10/24/2023  PCP: Margot Ables, MD (Inactive)  Admit date: 10/24/2023 Discharge date: 10/29/2023  Admitted From: Skilled nursing facility  Discharge disposition: Skilled nursing facility   Recommendations for Outpatient Follow-Up:   Follow up with your primary care provider at the skilled nursing facility in 3 to 5 days Check CBC, BMP, magnesium in the next visit Continue aspirin 321 mg for 4 weeks, can transition to 81 mg after that.  Discharge Diagnosis:   Principal Problem:   Femur fracture (HCC) Active Problems:   TBI (traumatic brain injury) (HCC)   History of short term memory loss   Macrocytic anemia   Discharge Condition: Improved.  Diet recommendation:   Regular.  Wound care: Pressure injury care  Code status: Full.   History of Present Illness:   Ernest Campbell is a 73 y.o. 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  Patient has cognitive deficits and not able to provide much history.   Patient was recently admitted for right hip fracture and was discharged on 09/30/2023 after patient underwent nailing of the right intertrochanteric femur fracture. In the ED,  x-rays revealed acute oblique displaced angulated foreshortened fracture of the mid right femur.  On-call orthopedic surgeon was consulted.  CT head scan was negative.  CT cervical spine with degenerative changes noted.Labs show hemoglobin of 10.6 creatinine 0.8 platelets 294. EKG shows normal sinus rhythm.  Patient was then admitted to the hospital for further evaluation and treatment.    Hospital Course:   Following conditions were addressed during hospitalization as listed below,  Right femur fracture - patient  had an unwitnessed fall.  CT head and C-spine negative for acute finding.  Orthopedic was consulted and patient underwent open reduction and internal fixation of right femoral fracture on 10/24/2019.  Patient's wife has requested that patient not to be given any narcotics, muscle relaxants for pain management.  Tylenol is okay.  Will continue Tylenol on discharge.  Orthopedics recommended weightbearing as tolerated with walker.  Orthopedics to follow the patient as outpatient in 2 weeks after surgery.   Anemia likely secondary to postoperative blood loss.  Received 1 unit of packed RBC for hemoglobin of 7.0.  Hemoglobin today of 8.0.  Hemodynamically stable.  On aspirin 325 milligram for VTE prophylaxis.  Will continue on discharge.  Check CBC.  Likely.   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  Try to avoid narcotics and muscle relaxants due to risk of delirium.   History of CVA  On aspirin at home.  Has been resumed.  Patient will continue aspirin 325 mg daily at this time for 4 weeks.  Can be transitioned to 81 mg after that.   Chronic macrocytic anemia Vitamin B12 423, folic acid 11.  Received 1 unit of packed RBC during hospitalization.  Check CBC periodically.   Nutrition Status: Body mass index is 20.92 kg/m.  Nutrition Problem: Severe Malnutrition Etiology: social / environmental circumstances (dementia) Signs/Symptoms: severe muscle depletion, severe fat depletion Interventions: Ensure Enlive (each supplement provides 350kcal and 20 grams of protein), MVI Continue nutritional supplements on discharge.  Pressure injury of sacrum stage II.  Present on admission.  Continue wound care.   Pressure Injury 10/24/23 Mid  Stage 2 -  Partial thickness loss of dermis presenting as a shallow open injury with a red, pink wound bed without slough. stage 2 (Active)  10/24/23 2057  Location:   Location Orientation: Mid  Staging: Stage 2  -  Partial thickness loss of dermis presenting as a shallow open injury with a red, pink wound bed without slough.  Wound Description (Comments): stage 2  Present on Admission: Yes    Disposition.  At this time, patient is stable for disposition to skilled nursing facility with PCP and orthopedic follow-up  Medical Consultants:   Orthopedics  Procedures:    Open reduction and internal fixation of Right femoral shaft fracture on 10/24/2023 by Dr. Jena Gauss. PRBC transfusion 1 unit. Subjective:   Today, patient was seen and examined at bedside.    Alert awake and mildly interactive  and says yes or no to few questions.  Denies pain. Marland Kitchen  Discharge Exam:   Vitals:   10/28/23 1957 10/29/23 0331  BP: (!) 96/50 130/67  Pulse: 83 78  Resp: 16 18  Temp: 97.7 F (36.5 C) 98.1 F (36.7 C)  SpO2: 100% 100%   Vitals:   10/28/23 0745 10/28/23 1538 10/28/23 1957 10/29/23 0331  BP: (!) 111/55 (!) 110/57 (!) 96/50 130/67  Pulse: 61 80 83 78  Resp: 18 18 16 18   Temp: 97.7 F (36.5 C) (!) 97.1 F (36.2 C) 97.7 F (36.5 C) 98.1 F (36.7 C)  TempSrc:  Oral    SpO2: 99% 100% 100% 100%  Weight:      Height:       GENERAL: Patient is alert awake and mildly interactive, has cognitive impairment,   oriented to place. HENT: No scleral pallor or icterus. Pupils equally reactive to light. Oral mucosa is moist NECK: is supple, no gross swelling noted. CHEST: Clear to auscultation. No crackles or wheezes.   CVS: S1 and S2 heard, no murmur. Regular rate and rhythm.  ABDOMEN: Soft, non-tender, bowel sounds are present. EXTREMITIES: No edema.  Dressing in the right thigh  CNS: Moves extremities. SKIN: warm and dry   The results of significant diagnostics from this hospitalization (including imaging, microbiology, ancillary and laboratory) are listed below for reference.     Diagnostic Studies:   DG FEMUR, MIN 2 VIEWS RIGHT Result Date: 10/24/2023 CLINICAL DATA:  Elective surgery. EXAM: RIGHT  FEMUR 2 VIEWS COMPARISON:  Preoperative imaging FINDINGS: 11 fluoroscopic spot views of the right femur submitted from the operating room. Lateral plate and screw fixation as well as cerclage wires traverse femoral shaft fracture. Previous proximal femoral hardware. Fluoroscopy time 56 seconds. Dose 4.82 mGy. IMPRESSION: Intraoperative fluoroscopy during femoral shaft fracture fixation. Electronically Signed   By: Narda Rutherford M.D.   On: 10/24/2023 15:55   DG FEMUR PORT, MIN 2 VIEWS RIGHT Result Date: 10/24/2023 CLINICAL DATA:  Fracture, postop. EXAM: RIGHT FEMUR PORTABLE 2 VIEW COMPARISON:  Preoperative imaging earlier today FINDINGS: Lateral plate and screw fixation is well as cerclage wires traversing femoral shaft fracture. Previous right proximal femoral hardware remains in place. Recent postsurgical change includes air and edema in the soft tissues. IMPRESSION: ORIF of right femoral shaft fracture. Electronically Signed   By: Narda Rutherford M.D.   On: 10/24/2023 15:53   DG C-Arm 1-60 Min-No Report Result Date: 10/24/2023 Fluoroscopy was utilized by the requesting physician.  No radiographic interpretation.   DG C-Arm 1-60 Min-No Report Result Date: 10/24/2023 Fluoroscopy was utilized by the requesting physician.  No radiographic interpretation.  CT Head Wo Contrast Result Date: 10/24/2023 CLINICAL DATA:  Unwitnessed fall, found on floor at his skilled nursing facility, Unknown head trauma, probable neck trauma. EXAM: CT HEAD WITHOUT CONTRAST CT CERVICAL SPINE WITHOUT CONTRAST TECHNIQUE: Multidetector CT imaging of the head and cervical spine was performed following the standard protocol without intravenous contrast. Multiplanar CT image reconstructions of the cervical spine were also generated. RADIATION DOSE REDUCTION: This exam was performed according to the departmental dose-optimization program which includes automated exposure control, adjustment of the mA and/or kV according to patient  size and/or use of iterative reconstruction technique. COMPARISON:  Cervical spine CT 10/06/2023, CT head 10/06/2023 FINDINGS: CT HEAD FINDINGS Brain: There is mild-to-moderate cerebral atrophy, small vessel disease and atrophic ventriculomegaly, without midline shift. No cortical based acute infarct, hemorrhage or mass effect is seen. Basal cisterns are clear. Vascular: No hyperdense vessel or unexpected calcification. Skull: Negative for fractures or focal lesions. Sinuses/Orbits: Chronic opacification right maxillary sinus. Other sinuses and mastoid air cells are clear. Negative visualized orbits. There has been a previous right inferior nasal turbinectomy. Other: None. CT CERVICAL SPINE FINDINGS Alignment: Minimal grade 1 degenerative anterolisthesis again is noted at C4-5, C7-T1 and T1-2. No traumatic or further listhesis is seen with narrowing and osteophytes of the anterior atlantodental jointagain noted. Skull base and vertebrae: No acute fracture. No primary bone lesion or focal pathologic process. Mild osteopenia. Soft tissues and spinal canal: No prevertebral fluid or swelling. No visible canal hematoma. Disc levels: There is mild disc space loss at C4-5 and C7-T1, more advanced disc space loss with bidirectional endplate osteophytes at C5-6 and C6-7. The other discs are normal in heights. There are no herniated discs or cord compromise. There is multilevel left greater than right facet joint hypertrophy, and uncinate spurring. Acquired foraminal stenosis is mild on the left at C3-4, severe on the left C4-5, bilaterally moderate C5-6, moderate to severe on the right and moderate on the left C6-7. The other foramina are clear. Upper chest: There are mild paraseptal emphysematous changes in both lung apices. Otherwise negative. Other: None. IMPRESSION: 1. No acute intracranial CT findings or depressed skull fractures. 2. Chronic changes. 3. Osteopenia and degenerative change without evidence of cervical  fractures. 4. Minimal grade 1 degenerative anterolisthesis C4-5, C7-T1 and T1-2. 5. Emphysema. Electronically Signed   By: Almira Bar M.D.   On: 10/24/2023 07:44   CT Cervical Spine Wo Contrast Result Date: 10/24/2023 CLINICAL DATA:  Unwitnessed fall, found on floor at his skilled nursing facility, Unknown head trauma, probable neck trauma. EXAM: CT HEAD WITHOUT CONTRAST CT CERVICAL SPINE WITHOUT CONTRAST TECHNIQUE: Multidetector CT imaging of the head and cervical spine was performed following the standard protocol without intravenous contrast. Multiplanar CT image reconstructions of the cervical spine were also generated. RADIATION DOSE REDUCTION: This exam was performed according to the departmental dose-optimization program which includes automated exposure control, adjustment of the mA and/or kV according to patient size and/or use of iterative reconstruction technique. COMPARISON:  Cervical spine CT 10/06/2023, CT head 10/06/2023 FINDINGS: CT HEAD FINDINGS Brain: There is mild-to-moderate cerebral atrophy, small vessel disease and atrophic ventriculomegaly, without midline shift. No cortical based acute infarct, hemorrhage or mass effect is seen. Basal cisterns are clear. Vascular: No hyperdense vessel or unexpected calcification. Skull: Negative for fractures or focal lesions. Sinuses/Orbits: Chronic opacification right maxillary sinus. Other sinuses and mastoid air cells are clear. Negative visualized orbits. There has been a previous right inferior nasal turbinectomy. Other: None. CT CERVICAL SPINE FINDINGS Alignment:  Minimal grade 1 degenerative anterolisthesis again is noted at C4-5, C7-T1 and T1-2. No traumatic or further listhesis is seen with narrowing and osteophytes of the anterior atlantodental jointagain noted. Skull base and vertebrae: No acute fracture. No primary bone lesion or focal pathologic process. Mild osteopenia. Soft tissues and spinal canal: No prevertebral fluid or swelling. No  visible canal hematoma. Disc levels: There is mild disc space loss at C4-5 and C7-T1, more advanced disc space loss with bidirectional endplate osteophytes at C5-6 and C6-7. The other discs are normal in heights. There are no herniated discs or cord compromise. There is multilevel left greater than right facet joint hypertrophy, and uncinate spurring. Acquired foraminal stenosis is mild on the left at C3-4, severe on the left C4-5, bilaterally moderate C5-6, moderate to severe on the right and moderate on the left C6-7. The other foramina are clear. Upper chest: There are mild paraseptal emphysematous changes in both lung apices. Otherwise negative. Other: None. IMPRESSION: 1. No acute intracranial CT findings or depressed skull fractures. 2. Chronic changes. 3. Osteopenia and degenerative change without evidence of cervical fractures. 4. Minimal grade 1 degenerative anterolisthesis C4-5, C7-T1 and T1-2. 5. Emphysema. Electronically Signed   By: Almira Bar M.D.   On: 10/24/2023 07:44   DG Hip Unilat W or Wo Pelvis 2-3 Views Right Result Date: 10/24/2023 CLINICAL DATA:  Found on floor at home. Nonverbal patient. Deformity in the leg. EXAM: DG HIP (WITH OR WITHOUT PELVIS) 2-3V RIGHT; RIGHT FEMUR 2 VIEWS COMPARISON:  Radiographs 10/06/2023 FINDINGS: Acute oblique fracture of the mid right femur. Apex anterior/lateral angulation. Approximately 10 cm of foreshortening. Intramedullary rod and screw fixation across a healing right intertrochanteric fracture. No radiographic evidence of hardware loosening. The fracture line remains visible. IMPRESSION: Acute oblique displaced, angulated, foreshortened fracture of the mid right femur. Electronically Signed   By: Minerva Fester M.D.   On: 10/24/2023 03:03   DG FEMUR, MIN 2 VIEWS RIGHT Result Date: 10/24/2023 CLINICAL DATA:  Found on floor at home. Nonverbal patient. Deformity in the leg. EXAM: DG HIP (WITH OR WITHOUT PELVIS) 2-3V RIGHT; RIGHT FEMUR 2 VIEWS  COMPARISON:  Radiographs 10/06/2023 FINDINGS: Acute oblique fracture of the mid right femur. Apex anterior/lateral angulation. Approximately 10 cm of foreshortening. Intramedullary rod and screw fixation across a healing right intertrochanteric fracture. No radiographic evidence of hardware loosening. The fracture line remains visible. IMPRESSION: Acute oblique displaced, angulated, foreshortened fracture of the mid right femur. Electronically Signed   By: Minerva Fester M.D.   On: 10/24/2023 03:03     Labs:   Basic Metabolic Panel: Recent Labs  Lab 10/24/23 0301 10/25/23 0556 10/26/23 0440 10/29/23 0649  NA 139 135 136 140  K 4.3 4.2 4.0 4.9  CL 107 102 108 105  CO2 23 23 21* 26  GLUCOSE 111* 121* 111* 108*  BUN 26* 14 14 11   CREATININE 0.82 0.83 0.88 0.82  CALCIUM 8.6* 7.9* 7.9* 9.0  MG  --   --  1.7  --    GFR Estimated Creatinine Clearance: 69.7 mL/min (by C-G formula based on SCr of 0.82 mg/dL). Liver Function Tests: Recent Labs  Lab 10/24/23 0301  AST 20  ALT 20  ALKPHOS 133*  BILITOT 0.6  PROT 6.1*  ALBUMIN 3.2*   No results for input(s): "LIPASE", "AMYLASE" in the last 168 hours. No results for input(s): "AMMONIA" in the last 168 hours. Coagulation profile Recent Labs  Lab 10/24/23 0301  INR 1.0    CBC: Recent  Labs  Lab 10/24/23 0301 10/25/23 0556 10/25/23 2153 10/26/23 0440 10/29/23 0649  WBC 11.5* 7.9 5.0 5.6 9.0  NEUTROABS 6.9  --   --   --   --   HGB 10.6* 7.0* 8.0* 8.0* 9.7*  HCT 32.2* 20.7* 23.3* 23.5* 29.1*  MCV 100.6* 96.7 95.1 96.3 96.7  PLT 294 235 180 189 357   Cardiac Enzymes: No results for input(s): "CKTOTAL", "CKMB", "CKMBINDEX", "TROPONINI" in the last 168 hours. BNP: Invalid input(s): "POCBNP" CBG: Recent Labs  Lab 10/25/23 2348 10/26/23 0514 10/27/23 1125 10/28/23 1203 10/28/23 1547  GLUCAP 117* 105* 104* 180* 129*   D-Dimer No results for input(s): "DDIMER" in the last 72 hours. Hgb A1c No results for input(s):  "HGBA1C" in the last 72 hours. Lipid Profile No results for input(s): "CHOL", "HDL", "LDLCALC", "TRIG", "CHOLHDL", "LDLDIRECT" in the last 72 hours. Thyroid function studies No results for input(s): "TSH", "T4TOTAL", "T3FREE", "THYROIDAB" in the last 72 hours.  Invalid input(s): "FREET3" Anemia work up No results for input(s): "VITAMINB12", "FOLATE", "FERRITIN", "TIBC", "IRON", "RETICCTPCT" in the last 72 hours. Microbiology No results found for this or any previous visit (from the past 240 hours).   Discharge Instructions:   Discharge Instructions     Call MD for:  redness, tenderness, or signs of infection (pain, swelling, redness, odor or green/yellow discharge around incision site)   Complete by: As directed    Call MD for:  severe uncontrolled pain   Complete by: As directed    Call MD for:  temperature >100.4   Complete by: As directed    Diet general   Complete by: As directed    Discharge instructions   Complete by: As directed    Follow-up with your primary care provider at the skilled nursing facility in 3 to 5 days.  Check blood work at that time.  Follow-up with Dr. Jena Gauss orthopedics in 1 week for postsurgical checkup.   Discharge wound care:   Complete by: As directed    Pressure injury care   Increase activity slowly   Complete by: As directed       Allergies as of 10/29/2023       Reactions   Fentanyl And Related Anaphylaxis   Other Other (See Comments)   According to the patient, all opioids cause hallucinations   Prednisone Other (See Comments)   Hallucinations   Codeine Other (See Comments)   Hallucinations   Morphine Other (See Comments)   Hallucinations   Oxycodone Other (See Comments)   Hallucinations        Medication List     STOP taking these medications    chlorhexidine 4 % external liquid Commonly known as: HIBICLENS   mupirocin ointment 2 % Commonly known as: BACTROBAN       TAKE these medications    acetaminophen 160  MG/5ML liquid Commonly known as: TYLENOL Take 10 mLs by mouth every 6 (six) hours as needed for pain.   ascorbic acid 500 MG tablet Commonly known as: VITAMIN C Take 500 mg by mouth daily.   aspirin 325 MG tablet Take 1 tablet (325 mg total) by mouth daily. What changed: Another medication with the same name was removed. Continue taking this medication, and follow the directions you see here.   carbidopa-levodopa 25-100 MG tablet Commonly known as: SINEMET IR Take 1 tablet by mouth 3 (three) times daily with meals.   docusate sodium 100 MG capsule Commonly known as: COLACE Take 1 capsule (100 mg total)  by mouth daily.   feeding supplement Liqd Take 237 mLs by mouth 2 (two) times daily between meals.   fluticasone 50 MCG/ACT nasal spray Commonly known as: FLONASE Place 1 spray into both nostrils daily.   Ibuprofen 40 MG/ML Susp Take 5 mLs by mouth 2 (two) times daily.   memantine 5 MG tablet Commonly known as: Namenda Take 1 tablet (5 mg total) by mouth 2 (two) times daily.   multivitamin with minerals Tabs tablet Take 1 tablet by mouth daily. Start taking on: October 30, 2023   polyethylene glycol 17 g packet Commonly known as: MIRALAX / GLYCOLAX Take 17 g by mouth daily as needed for mild constipation.   QUEtiapine 25 MG tablet Commonly known as: SEROQUEL Take 1 tablet (25 mg total) by mouth at bedtime.               Discharge Care Instructions  (From admission, onward)           Start     Ordered   10/29/23 0000  Discharge wound care:       Comments: Pressure injury care   10/29/23 1226            Follow-up Information     Haddix, Gillie Manners, MD. Schedule an appointment as soon as possible for a visit in 2 week(s).   Specialty: Orthopedic Surgery Why: for wound check and repeat x-rays Contact information: [address] [place] [address] [phone]         Margot Ables, MD .   Specialty: Family Medicine Contact  information: [address] [place] [address] [phone]                  Time coordinating discharge: 39 minutes  Signed:  Claudis Giovanelli  [facility] 10/29/2023, 12:31 PM

## 2023-10-29 NOTE — Progress Notes (Signed)
 This nurse attempted to call report x2 to number [phone] voicemail left both times with number to reach me. Pt IV removed and intact. Vitals taken and Pt transported via [facility].

## 2023-10-29 NOTE — Progress Notes (Signed)
 PROGRESS NOTE  Ernest Campbell ZOX:[id number] DOB: [birth year] DOA: 10/24/2023 PCP: Margot Ables, MD (Inactive)   LOS: 5 days   Brief narrative:  Ernest Campbell is a 73 y.o. 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  Patient has cognitive deficits and not able to provide much history.   Patient was recently admitted for right hip fracture and was discharged on 09/30/2023 after nailing of the right intertrochanteric femur fracture. In the ED,  x-rays revealed acute oblique displaced angulated foreshortened fracture of the mid right femur.  CT head scan was negative.    CT cervical spine with degenerative changes noted. Labs show hemoglobin of 10.6 creatinine 0.8 platelets 294. EKG shows normal sinus rhythm.  On-call orthopedic surgeon was consulted and patient was then admitted to the hospital for further evaluation and treatment.  During hospitalization, patient has undergone surgical treatment with fracture on 10/24/2019.  At this time awaiting for skilled nursing facility placement.  Assessment/Plan: Principal Problem:   Femur fracture (HCC) Active Problems:   TBI (traumatic brain injury) (HCC)   History of short term memory loss   Macrocytic anemia  Right femur fracture -  patient had an unwitnessed fall.  CT head and C-spine negative for acute finding.  Orthopedics was consulted and patient underwent open reduction and internal fixation of right femoral fracture on 10/24/2019.  Patient's wife has requested that patient not to be given any narcotics, muscle relaxants for pain management.  Tylenol is okay for pain relief.  Tylenol has been increased to 1 g every 6 hourly at the request of the family.  Orthopedics recommended weightbearing as tolerated with walker.  Orthopedics to  follow the patient as outpatient after discharge.  Anemia likely secondary to postoperative blood loss.  Received 1 unit of packed RBC for hemoglobin of 7.0.  Latest hemoglobin at 9.7 and has been improving.  Hemodynamically stable.  On aspirin 325 milligram for VTE prophylaxis.  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  Avoid narcotics and muscle relaxants due to risk of delirium as per family request.  History of CVA  On aspirin at home.  Continue while in the hospital.  Chronic macrocytic anemia Vitamin B12 423, folic acid 11.  Received 1 unit of packed RBC during hospitalization.  Latest hemoglobin 8.0.  Nutrition Status: Body mass index is 20.92 kg/m.  Nutrition Problem: Severe Malnutrition Etiology: social / environmental circumstances (dementia) Signs/Symptoms: severe muscle depletion, severe fat depletion Interventions: Ensure Enlive (each supplement provides 350kcal and 20 grams of protein), MVI Continue nutritional supplements.  Pressure injury of sacrum stage II.  Present on admission.  Continue wound care.  Pressure Injury 10/24/23 Mid Stage 2 -  Partial thickness loss of dermis presenting as a shallow open injury with a red, pink wound bed without slough. stage 2 (Active)  10/24/23 2057  Location:   Location Orientation: Mid  Staging: Stage 2 -  Partial thickness loss of dermis presenting as a shallow open injury with a red, pink wound bed without slough.  Wound Description (Comments): stage 2  Present on Admission: Yes      DVT prophylaxis: SCDs Start: 10/24/23 1557 SCDs Start: 10/24/23 0551   Disposition:  Physical therapy has recommended skilled nursing facility placement. .  TOC on board.  Medically stable for disposition.  Status is: Inpatient Remains inpatient appropriate because: Status post hip fracture, need for rehabilitation    Code Status:     Code Status: Full Code  Family  Communication:.  Spoke with patient's wife on the phone on 10/25/2023  Consultants: Orthopedics  Procedures: Open reduction and internal fixation of Right femoral shaft fracture on 10/24/2023 by Dr. Jena Gauss. PRBC transfusion 1 unit.  Anti-infectives:  None currently  Subjective:  Today, patient was seen and examined at bedside.  Mildly interactive and says yes or no to few questions.  Denies pain.  Objective: Vitals:   10/28/23 1957 10/29/23 0331  BP: (!) 96/50 130/67  Pulse: 83 78  Resp: 16 18  Temp: 97.7 F (36.5 C) 98.1 F (36.7 C)  SpO2: 100% 100%   No intake or output data in the 24 hours ending 10/29/23 1135   Filed Weights   10/24/23 0148 10/24/23 1827  Weight: 58.8 kg 58.8 kg   Body mass index is 20.92 kg/m.   Physical Exam:  GENERAL: Patient is alert awake and mildly interactive, has cognitive impairment,   oriented to place. HENT: No scleral pallor or icterus. Pupils equally reactive to light. Oral mucosa is moist NECK: is supple, no gross swelling noted. CHEST: Clear to auscultation. No crackles or wheezes.   CVS: S1 and S2 heard, no murmur. Regular rate and rhythm.  ABDOMEN: Soft, non-tender, bowel sounds are present. EXTREMITIES: No edema.  Dressing in the right thigh  CNS: Moves extremities. SKIN: warm and dry   Data Review: I have personally reviewed the following laboratory data and studies,  CBC: Recent Labs  Lab 10/24/23 0301 10/25/23 0556 10/25/23 2153 10/26/23 0440 10/29/23 0649  WBC 11.5* 7.9 5.0 5.6 9.0  NEUTROABS 6.9  --   --   --   --   HGB 10.6* 7.0* 8.0* 8.0* 9.7*  HCT 32.2* 20.7* 23.3* 23.5* 29.1*  MCV 100.6* 96.7 95.1 96.3 96.7  PLT 294 235 180 189 357   Basic Metabolic Panel: Recent Labs  Lab 10/24/23 0301 10/25/23 0556 10/26/23 0440 10/29/23 0649  NA 139 135 136 140  K 4.3 4.2 4.0 4.9  CL 107 102 108 105  CO2 23 23 21* 26  GLUCOSE 111* 121* 111* 108*  BUN 26* 14 14 11   CREATININE 0.82 0.83 0.88 0.82  CALCIUM  8.6* 7.9* 7.9* 9.0  MG  --   --  1.7  --    Liver Function Tests: Recent Labs  Lab 10/24/23 0301  AST 20  ALT 20  ALKPHOS 133*  BILITOT 0.6  PROT 6.1*  ALBUMIN 3.2*   No results for input(s): "LIPASE", "AMYLASE" in the last 168 hours. No results for input(s): "AMMONIA" in the last 168 hours. Cardiac Enzymes: No results for input(s): "CKTOTAL", "CKMB", "CKMBINDEX", "TROPONINI" in the last 168 hours. BNP (last 3 results) No results for input(s): "BNP" in the last 8760 hours.  ProBNP (last 3 results) No results for input(s): "PROBNP" in the last 8760 hours.  CBG: Recent Labs  Lab 10/25/23 2348 10/26/23 0514 10/27/23 1125 10/28/23 1203 10/28/23 1547  GLUCAP 117* 105* 104* 180* 129*   No results found for this or any previous visit (from the past 240 hours).   Studies: No results found.     Joycelyn Das, MD  [facility] 10/29/2023  If 7PM-7AM, please contact night-coverage

## 2023-10-29 NOTE — Progress Notes (Signed)
 Occupational Therapy Treatment Patient Details Name: Ernest Campbell MRN: [id number] DOB: March 12, 1951 Today's Date: 10/29/2023   History of present illness 73 yo male presents to [facility] on 3/20 s/p fall at [facility] with + R hip pain. Pt sustained R periprosthetic femur fx, s/p ORIF 3/20. Recent admissions for agitation 2/7 and R hip ORIF 09/26/23. PMH includes recent CVA, TBI, dementia, and parkinsonism.   OT comments  Patient received in supine and patient's niece present. Patient required max assist for sidelying to sitting on EOB. Once on EOB patient required min assist for sitting balance and progressed to CGA to supervision. Patient able to perform face washing with mod assist due to [facility] to initiate. Standing attempted while on EOB with max assist to RW with patient unable to come to complete stand. Patient assisted back to supine with max assist and mod assist for rolling to straighten pads. Patient will benefit from continued inpatient follow up therapy, <3 hours/day. Acute OT to continue to follow to address established goals to facilitate DC to next venue of care.       If plan is discharge home, recommend the following:  Two people to help with walking and/or transfers;Two people to help with bathing/dressing/bathroom   Equipment Recommendations  Other (comment) (defer)    Recommendations for Other Services      Precautions / Restrictions Precautions Precautions: Fall Recall of Precautions/Restrictions: Impaired Restrictions Weight Bearing Restrictions Per Provider Order: No Other Position/Activity Restrictions: RLE WBAT with RW       Mobility Bed Mobility Overal bed mobility: Needs Assistance Bed Mobility: Rolling, Sidelying to Sit, Sit to Sidelying Rolling: Mod assist Sidelying to sit: Max assist     Sit to sidelying: Max assist General bed mobility comments: performed rolling to straighten pads, required assistance for trunk and BLE for EOB And back to sidelying     Transfers Overall transfer level: Needs assistance Equipment used: Rolling walker (2 wheels) Transfers: Sit to/from Stand Sit to Stand: Max assist, Total assist           General transfer comment: unable to come to complete stand from EOB     Balance Overall balance assessment: Needs assistance Sitting-balance support: Feet supported, Bilateral upper extremity supported Sitting balance-Leahy Scale: Poor Sitting balance - Comments: posterior bias and min assist for sitting balance on EOB and progressed to supervision to CGA   Standing balance support: Bilateral upper extremity supported, During functional activity Standing balance-Leahy Scale: Zero Standing balance comment: unable to come to complete standing                           ADL either performed or assessed with clinical judgement   ADL Overall ADL's : Needs assistance/impaired     Grooming: Wash/dry hands;Moderate assistance;Sitting Grooming Details (indicate cue type and reason): on EOB with HOH to initiate             Lower Body Dressing: Total assistance;Bed level Lower Body Dressing Details (indicate cue type and reason): to donn socks               General ADL Comments: max-total assist for all ADls due to cognition and pain    Extremity/Trunk Assessment Upper Extremity Assessment Upper Extremity Assessment: Generalized weakness            Vision       Perception     Praxis     Communication Communication Communication: Impaired Factors Affecting Communication:  Difficulty expressing self;Reduced clarity of speech   Cognition Arousal: Alert Behavior During Therapy: Restless Cognition: History of cognitive impairments             OT - Cognition Comments: oriented to self only                 Following commands: Impaired Following commands impaired: Follows one step commands with increased time, Follows one step commands inconsistently      Cueing    Cueing Techniques: Verbal cues, Gestural cues, Tactile cues  Exercises      Shoulder Instructions       General Comments      Pertinent Vitals/ Pain       Pain Assessment Pain Assessment: Faces Faces Pain Scale: Hurts little more Pain Location: RLE and back Pain Descriptors / Indicators: Grimacing, Guarding Pain Intervention(s): Limited activity within patient's tolerance, Monitored during session, Repositioned  Home Living                                          Prior Functioning/Environment              Frequency  Min 2X/week        Progress Toward Goals  OT Goals(current goals can now be found in the care plan section)  Progress towards OT goals: Progressing toward goals  Acute Rehab OT Goals Patient Stated Goal: to progress to one person transfers OT Goal Formulation: With family Time For Goal Achievement: 11/08/23 Potential to Achieve Goals: Good ADL Goals Pt Will Perform Grooming: with mod assist;sitting Pt Will Transfer to Toilet: with mod assist;with +2 assist;bedside commode Additional ADL Goal #1: Pt will complete bed mobility with mod assist +2 as precursor to ADLs. Additional ADL Goal #2: Pt will follow 1 step commands with 90% accuracy.  Plan      Co-evaluation                 AM-PAC OT "6 Clicks" Daily Activity     Outcome Measure   Help from another person eating meals?: A Lot Help from another person taking care of personal grooming?: A Lot Help from another person toileting, which includes using toliet, bedpan, or urinal?: Total Help from another person bathing (including washing, rinsing, drying)?: Total Help from another person to put on and taking off regular upper body clothing?: A Lot Help from another person to put on and taking off regular lower body clothing?: Total 6 Click Score: 9    End of Session Equipment Utilized During Treatment: Rolling walker (2 wheels)  OT Visit Diagnosis: Other  abnormalities of gait and mobility (R26.89);Muscle weakness (generalized) (M62.81);Pain;History of falling (Z91.81) Pain - Right/Left: Right Pain - part of body: Leg   Activity Tolerance Patient tolerated treatment well   Patient Left in bed;with call bell/phone within reach;with bed alarm set;with family/visitor present   Nurse Communication Mobility status        Time: 3086-5784 OT Time Calculation (min): 19 min  Charges: OT General Charges $OT Visit: 1 Visit OT Treatments $Therapeutic Activity: 8-22 mins  Alfonse Flavors, OTA Acute Rehabilitation Services  Office [phone]   Dewain Penning 10/29/2023, 3:02 PM

## 2023-11-07 ENCOUNTER — Emergency Department (HOSPITAL_COMMUNITY)

## 2023-11-07 ENCOUNTER — Other Ambulatory Visit: Payer: Self-pay

## 2023-11-07 ENCOUNTER — Encounter (HOSPITAL_COMMUNITY): Payer: Self-pay | Admitting: Emergency Medicine

## 2023-11-07 ENCOUNTER — Inpatient Hospital Stay (HOSPITAL_COMMUNITY)
Admission: EM | Admit: 2023-11-07 | Discharge: 2023-11-12 | DRG: 871 | Disposition: A | Discharge status: Skilled Nursing Facility | Source: Skilled Nursing Facility | Attending: Internal Medicine | Admitting: Internal Medicine

## 2023-11-07 DIAGNOSIS — N179 Acute kidney failure, unspecified: Principal | ICD-10-CM | POA: Diagnosis present

## 2023-11-07 DIAGNOSIS — F0390 Unspecified dementia without behavioral disturbance: Secondary | ICD-10-CM | POA: Diagnosis present

## 2023-11-07 DIAGNOSIS — R4182 Altered mental status, unspecified: Principal | ICD-10-CM

## 2023-11-07 DIAGNOSIS — Z885 Allergy status to narcotic agent status: Secondary | ICD-10-CM | POA: Diagnosis not present

## 2023-11-07 DIAGNOSIS — L89312 Pressure ulcer of right buttock, stage 2: Secondary | ICD-10-CM | POA: Diagnosis present

## 2023-11-07 DIAGNOSIS — T68XXXA Hypothermia, initial encounter: Secondary | ICD-10-CM | POA: Diagnosis present

## 2023-11-07 DIAGNOSIS — Z681 Body mass index (BMI) 19 or less, adult: Secondary | ICD-10-CM

## 2023-11-07 DIAGNOSIS — Z7189 Other specified counseling: Secondary | ICD-10-CM

## 2023-11-07 DIAGNOSIS — B370 Candidal stomatitis: Secondary | ICD-10-CM | POA: Diagnosis present

## 2023-11-07 DIAGNOSIS — M19042 Primary osteoarthritis, left hand: Secondary | ICD-10-CM | POA: Diagnosis present

## 2023-11-07 DIAGNOSIS — L89622 Pressure ulcer of left heel, stage 2: Secondary | ICD-10-CM | POA: Diagnosis present

## 2023-11-07 DIAGNOSIS — R296 Repeated falls: Secondary | ICD-10-CM | POA: Diagnosis present

## 2023-11-07 DIAGNOSIS — E870 Hyperosmolality and hypernatremia: Secondary | ICD-10-CM | POA: Diagnosis present

## 2023-11-07 DIAGNOSIS — R4189 Other symptoms and signs involving cognitive functions and awareness: Secondary | ICD-10-CM | POA: Diagnosis present

## 2023-11-07 DIAGNOSIS — R000 Tachycardia, unspecified: Secondary | ICD-10-CM | POA: Diagnosis present

## 2023-11-07 DIAGNOSIS — Z8673 Personal history of transient ischemic attack (TIA), and cerebral infarction without residual deficits: Secondary | ICD-10-CM | POA: Diagnosis not present

## 2023-11-07 DIAGNOSIS — D539 Nutritional anemia, unspecified: Secondary | ICD-10-CM | POA: Diagnosis present

## 2023-11-07 DIAGNOSIS — Z7982 Long term (current) use of aspirin: Secondary | ICD-10-CM

## 2023-11-07 DIAGNOSIS — Z515 Encounter for palliative care: Secondary | ICD-10-CM | POA: Diagnosis not present

## 2023-11-07 DIAGNOSIS — Z1152 Encounter for screening for COVID-19: Secondary | ICD-10-CM | POA: Diagnosis not present

## 2023-11-07 DIAGNOSIS — L89153 Pressure ulcer of sacral region, stage 3: Secondary | ICD-10-CM | POA: Diagnosis present

## 2023-11-07 DIAGNOSIS — Z823 Family history of stroke: Secondary | ICD-10-CM

## 2023-11-07 DIAGNOSIS — A4151 Sepsis due to Escherichia coli [E. coli]: Principal | ICD-10-CM | POA: Diagnosis present

## 2023-11-07 DIAGNOSIS — E860 Dehydration: Secondary | ICD-10-CM | POA: Diagnosis present

## 2023-11-07 DIAGNOSIS — Z803 Family history of malignant neoplasm of breast: Secondary | ICD-10-CM

## 2023-11-07 DIAGNOSIS — Z87892 Personal history of anaphylaxis: Secondary | ICD-10-CM

## 2023-11-07 DIAGNOSIS — R627 Adult failure to thrive: Secondary | ICD-10-CM | POA: Diagnosis present

## 2023-11-07 DIAGNOSIS — E871 Hypo-osmolality and hyponatremia: Secondary | ICD-10-CM | POA: Diagnosis present

## 2023-11-07 DIAGNOSIS — E43 Unspecified severe protein-calorie malnutrition: Secondary | ICD-10-CM | POA: Diagnosis present

## 2023-11-07 DIAGNOSIS — L89612 Pressure ulcer of right heel, stage 2: Secondary | ICD-10-CM | POA: Diagnosis present

## 2023-11-07 DIAGNOSIS — M19041 Primary osteoarthritis, right hand: Secondary | ICD-10-CM | POA: Diagnosis present

## 2023-11-07 DIAGNOSIS — F17210 Nicotine dependence, cigarettes, uncomplicated: Secondary | ICD-10-CM | POA: Diagnosis present

## 2023-11-07 DIAGNOSIS — E876 Hypokalemia: Secondary | ICD-10-CM | POA: Diagnosis present

## 2023-11-07 DIAGNOSIS — I959 Hypotension, unspecified: Secondary | ICD-10-CM | POA: Diagnosis present

## 2023-11-07 DIAGNOSIS — Z79899 Other long term (current) drug therapy: Secondary | ICD-10-CM

## 2023-11-07 DIAGNOSIS — S7291XD Unspecified fracture of right femur, subsequent encounter for closed fracture with routine healing: Secondary | ICD-10-CM

## 2023-11-07 DIAGNOSIS — N390 Urinary tract infection, site not specified: Secondary | ICD-10-CM | POA: Diagnosis present

## 2023-11-07 LAB — URINALYSIS, W/ REFLEX TO CULTURE (INFECTION SUSPECTED)
Bilirubin Urine: NEGATIVE
Glucose, UA: NEGATIVE mg/dL
Ketones, ur: NEGATIVE mg/dL
Nitrite: NEGATIVE
Protein, ur: NEGATIVE mg/dL
Specific Gravity, Urine: 1.017 (ref 1.005–1.030)
pH: 5 (ref 5.0–8.0)

## 2023-11-07 LAB — CBC WITH DIFFERENTIAL/PLATELET
Abs Immature Granulocytes: 0.02 10*3/uL (ref 0.00–0.07)
Basophils Absolute: 0 10*3/uL (ref 0.0–0.1)
Basophils Relative: 0 %
Eosinophils Absolute: 0 10*3/uL (ref 0.0–0.5)
Eosinophils Relative: 0 %
HCT: 34.6 % — ABNORMAL LOW (ref 39.0–52.0)
Hemoglobin: 10.6 g/dL — ABNORMAL LOW (ref 13.0–17.0)
Immature Granulocytes: 0 %
Lymphocytes Relative: 19 %
Lymphs Abs: 1.3 10*3/uL (ref 0.7–4.0)
MCH: 32.2 pg (ref 26.0–34.0)
MCHC: 30.6 g/dL (ref 30.0–36.0)
MCV: 105.2 fL — ABNORMAL HIGH (ref 80.0–100.0)
Monocytes Absolute: 0.8 10*3/uL (ref 0.1–1.0)
Monocytes Relative: 11 %
Neutro Abs: 4.9 10*3/uL (ref 1.7–7.7)
Neutrophils Relative %: 70 %
Platelets: 514 10*3/uL — ABNORMAL HIGH (ref 150–400)
RBC: 3.29 MIL/uL — ABNORMAL LOW (ref 4.22–5.81)
RDW: 14.6 % (ref 11.5–15.5)
WBC: 7 10*3/uL (ref 4.0–10.5)
nRBC: 0 % (ref 0.0–0.2)

## 2023-11-07 LAB — COMPREHENSIVE METABOLIC PANEL WITH GFR
ALT: 27 U/L (ref 0–44)
AST: 37 U/L (ref 15–41)
Albumin: 2.7 g/dL — ABNORMAL LOW (ref 3.5–5.0)
Alkaline Phosphatase: 76 U/L (ref 38–126)
Anion gap: 11 (ref 5–15)
BUN: 141 mg/dL — ABNORMAL HIGH (ref 8–23)
CO2: 21 mmol/L — ABNORMAL LOW (ref 22–32)
Calcium: 8.3 mg/dL — ABNORMAL LOW (ref 8.9–10.3)
Chloride: 120 mmol/L — ABNORMAL HIGH (ref 98–111)
Creatinine, Ser: 4.17 mg/dL — ABNORMAL HIGH (ref 0.61–1.24)
GFR, Estimated: 15 mL/min — ABNORMAL LOW (ref >60)
Glucose, Bld: 121 mg/dL — ABNORMAL HIGH (ref 70–99)
Potassium: 4.2 mmol/L (ref 3.5–5.1)
Sodium: 152 mmol/L — ABNORMAL HIGH (ref 135–145)
Total Bilirubin: 0.8 mg/dL (ref 0.0–1.2)
Total Protein: 6 g/dL — ABNORMAL LOW (ref 6.5–8.1)

## 2023-11-07 LAB — BASIC METABOLIC PANEL WITH GFR
Anion gap: 9 (ref 5–15)
BUN: 122 mg/dL — ABNORMAL HIGH (ref 8–23)
CO2: 20 mmol/L — ABNORMAL LOW (ref 22–32)
Calcium: 7.9 mg/dL — ABNORMAL LOW (ref 8.9–10.3)
Chloride: 126 mmol/L — ABNORMAL HIGH (ref 98–111)
Creatinine, Ser: 2.85 mg/dL — ABNORMAL HIGH (ref 0.61–1.24)
GFR, Estimated: 23 mL/min — ABNORMAL LOW (ref >60)
Glucose, Bld: 101 mg/dL — ABNORMAL HIGH (ref 70–99)
Potassium: 4.1 mmol/L (ref 3.5–5.1)
Sodium: 155 mmol/L — ABNORMAL HIGH (ref 135–145)

## 2023-11-07 LAB — GLUCOSE, CAPILLARY: Glucose-Capillary: 82 mg/dL (ref 70–99)

## 2023-11-07 LAB — PROTIME-INR
INR: 1.3 — ABNORMAL HIGH (ref 0.8–1.2)
Prothrombin Time: 16.5 s — ABNORMAL HIGH (ref 11.4–15.2)

## 2023-11-07 LAB — RESP PANEL BY RT-PCR (RSV, FLU A&B, COVID)  RVPGX2
Influenza A by PCR: NEGATIVE
Influenza B by PCR: NEGATIVE
Resp Syncytial Virus by PCR: NEGATIVE
SARS Coronavirus 2 by RT PCR: NEGATIVE

## 2023-11-07 LAB — APTT: aPTT: 27 s (ref 24–36)

## 2023-11-07 LAB — I-STAT CG4 LACTIC ACID, ED
Lactic Acid, Venous: 1.9 mmol/L (ref 0.5–1.9)
Lactic Acid, Venous: 1 mmol/L (ref 0.5–1.9)

## 2023-11-07 MED ORDER — DOCUSATE SODIUM 100 MG PO CAPS
100.0000 mg | ORAL_CAPSULE | Freq: Every evening | ORAL | Status: DC
  Administered 2023-11-09 – 2023-11-11 (×3): 100 mg via ORAL
  Filled 2023-11-07 (×3): qty 1

## 2023-11-07 MED ORDER — PIPERACILLIN-TAZOBACTAM 3.375 G IVPB: 3.3750 g | Freq: Two times a day (BID) | INTRAVENOUS | Status: DC

## 2023-11-07 MED ORDER — SODIUM CHLORIDE 0.45 % IV SOLN: INTRAVENOUS | Status: DC

## 2023-11-07 MED ORDER — ONDANSETRON HCL 4 MG/2ML IJ SOLN: 4.0000 mg | Freq: Four times a day (QID) | INTRAMUSCULAR | Status: DC | PRN

## 2023-11-07 MED ORDER — ACETAMINOPHEN 650 MG RE SUPP
650.0000 mg | Freq: Once | RECTAL | Status: AC
  Administered 2023-11-07: 650 mg via RECTAL
  Filled 2023-11-07: qty 1

## 2023-11-07 MED ORDER — SODIUM CHLORIDE 0.9 % IV BOLUS
2000.0000 mL | Freq: Once | INTRAVENOUS | Status: AC
  Administered 2023-11-07: 2000 mL via INTRAVENOUS

## 2023-11-07 MED ORDER — POLYETHYLENE GLYCOL 3350 17 G PO PACK: 17.0000 g | PACK | Freq: Every day | ORAL | Status: DC | PRN

## 2023-11-07 MED ORDER — HEPARIN SODIUM (PORCINE) 5000 UNIT/ML IJ SOLN
5000.0000 [IU] | Freq: Three times a day (TID) | INTRAMUSCULAR | Status: DC
  Administered 2023-11-07 – 2023-11-12 (×14): 5000 [IU] via SUBCUTANEOUS
  Filled 2023-11-07 (×14): qty 1

## 2023-11-07 MED ORDER — PIPERACILLIN-TAZOBACTAM 3.375 G IVPB 30 MIN
3.3750 g | Freq: Once | INTRAVENOUS | Status: AC
  Administered 2023-11-07: 3.375 g via INTRAVENOUS
  Filled 2023-11-07: qty 50

## 2023-11-07 MED ORDER — FLUCONAZOLE 100 MG PO TABS
100.0000 mg | ORAL_TABLET | Freq: Every day | ORAL | Status: DC
  Administered 2023-11-09 – 2023-11-12 (×4): 100 mg via ORAL
  Filled 2023-11-07 (×10): qty 1

## 2023-11-07 MED ORDER — ALBUTEROL SULFATE (2.5 MG/3ML) 0.083% IN NEBU: 2.5000 mg | INHALATION_SOLUTION | RESPIRATORY_TRACT | Status: DC | PRN

## 2023-11-07 MED ORDER — CARBIDOPA-LEVODOPA 25-100 MG PO TABS: 1.0000 | ORAL_TABLET | Freq: Three times a day (TID) | ORAL | Status: DC

## 2023-11-07 MED ORDER — MEMANTINE HCL 5 MG PO TABS
5.0000 mg | ORAL_TABLET | Freq: Two times a day (BID) | ORAL | Status: DC
  Administered 2023-11-09 – 2023-11-12 (×7): 5 mg via ORAL
  Filled 2023-11-07 (×8): qty 1

## 2023-11-07 MED ORDER — ENSURE ENLIVE PO LIQD
237.0000 mL | Freq: Three times a day (TID) | ORAL | Status: DC
  Filled 2023-11-07 (×2): qty 237

## 2023-11-07 MED ORDER — SODIUM CHLORIDE 0.9 % IV SOLN
2.0000 g | Freq: Once | INTRAVENOUS | Status: AC
  Administered 2023-11-07: 2 g via INTRAVENOUS
  Filled 2023-11-07: qty 20

## 2023-11-07 MED ORDER — LACTATED RINGERS IV BOLUS
3000.0000 mL | Freq: Once | INTRAVENOUS | Status: AC
  Administered 2023-11-07: 3000 mL via INTRAVENOUS

## 2023-11-07 MED ORDER — FLUTICASONE PROPIONATE 50 MCG/ACT NA SUSP
1.0000 | Freq: Every day | NASAL | Status: DC
  Administered 2023-11-08 – 2023-11-12 (×5): 1 via NASAL
  Filled 2023-11-07: qty 16

## 2023-11-07 MED ORDER — ACETAMINOPHEN 325 MG PO TABS
650.0000 mg | ORAL_TABLET | Freq: Four times a day (QID) | ORAL | Status: DC | PRN
  Administered 2023-11-09 – 2023-11-12 (×6): 650 mg via ORAL
  Filled 2023-11-07 (×6): qty 2

## 2023-11-07 MED ORDER — CARBIDOPA-LEVODOPA ER 25-100 MG PO TBCR: 1.0000 | EXTENDED_RELEASE_TABLET | ORAL | Status: DC

## 2023-11-07 MED ORDER — ACETAMINOPHEN 650 MG RE SUPP: 650.0000 mg | Freq: Four times a day (QID) | RECTAL | Status: DC | PRN

## 2023-11-07 MED ORDER — QUETIAPINE FUMARATE 50 MG PO TABS: 25.0000 mg | ORAL_TABLET | Freq: Every day | ORAL | Status: DC

## 2023-11-07 MED ORDER — ASPIRIN 325 MG PO TBEC
325.0000 mg | DELAYED_RELEASE_TABLET | Freq: Every day | ORAL | Status: DC
Start: 2023-11-07 — End: 2023-11-12
  Administered 2023-11-09 – 2023-11-12 (×4): 325 mg via ORAL
  Filled 2023-11-07 (×10): qty 1

## 2023-11-07 MED ORDER — ONDANSETRON HCL 4 MG PO TABS
4.0000 mg | ORAL_TABLET | Freq: Four times a day (QID) | ORAL | Status: DC | PRN
Start: 2023-11-07 — End: 2023-11-12

## 2023-11-07 MED ORDER — CARBIDOPA-LEVODOPA ER 25-100 MG PO TBCR
1.0000 | EXTENDED_RELEASE_TABLET | Freq: Three times a day (TID) | ORAL | Status: DC
  Administered 2023-11-09 – 2023-11-12 (×10): 1 via ORAL
  Filled 2023-11-07 (×15): qty 1

## 2023-11-07 MED ORDER — LACTATED RINGERS IV SOLN: INTRAVENOUS | Status: DC

## 2023-11-07 NOTE — Progress Notes (Signed)
 Pharmacy Antibiotic Note  Ernest Campbell is a 73 y.o. male admitted on 11/07/2023 with sepsis, UTI, and aspiration .  Pharmacy has been consulted for Zosyn dosing.  Plan: Zosyn 3.375 g infused over 4 hours every 12 hours Follow up renal function, culture results, and clinical course.      Temp (24hrs), Avg:96.9 F (36.1 C), Min:96.2 F (35.7 C), Max:98 F (36.7 C)  Recent Labs  Lab 11/07/23 1215 11/07/23 1217 11/07/23 1546  WBC 7.0  --   --   CREATININE 4.17*  --   --   LATICACIDVEN  --  1.9 1.0    Estimated Creatinine Clearance: 13.7 mL/min (A) (by C-G formula based on SCr of 4.17 mg/dL (H)).    Allergies  Allergen Reactions   Fentanyl And Related Anaphylaxis   Other Other (See Comments)    According to the patient, all opioids cause hallucinations    Prednisone Other (See Comments)    Hallucinations - "Allergic," per [facility]   Codeine Other (See Comments)    Hallucinations - "Allergic," per [facility]   Morphine Other (See Comments)    Hallucinations - "Allergic," per [facility]   Oxycodone Other (See Comments)    Hallucinations - "Allergic," per [facility]    Antimicrobials this admission: 4/3 Ceftiaxone x1 4/3 Zosyn >>   Dose adjustments this admission:   Microbiology results: 4/3 BCx:  4/3 Resp panel:  4/3 UCx:   Thank you for allowing pharmacy to be a part of this patient's care.  Lynann Beaver PharmD, BCPS [facility] main pharmacy [phone] 11/07/2023 5:55 PM

## 2023-11-07 NOTE — H&P (Signed)
 History and Physical  Ernest Campbell NGE:[id number] DOB: 01/11/1951 DOA: 11/07/2023  PCP: Margot Ables, MD (Inactive)   Chief Complaint: Reduced responsiveness  HPI: Ernest Campbell is a 73 y.o. 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  Unfortunately patient's wife states that since December he has had multiple hospital stays, prior to which he was living independently at home with his wife.  He used to be quite interactive, have a good appetite.  He started having falls at home and worsening delirium, ended up in the hospital multiple times and at rehab.  Unfortunately he fell a couple of times while at [facility], most recently has been at [facility].  She states that essentially for the last 3 months or so, he has been declining, losing weight, less interactive not eating or drinking.  Just about 3 or 4 days ago, he was diagnosed with UTI and started on antibiotics.  She also feels that he has not been eating due to thrush.  On workup in the emergency department, he was noted to be hypothermic, have some evidence of UTI.  He has also been quite hypotensive and found to have severe renal failure.  He was started on IV antibiotics empirically, as well as IV fluids and admitted to the hospitalist service.  Review of Systems: Please see HPI for pertinent positives and negatives. A complete 10 system review of systems are otherwise negative.  Past Medical History:  Diagnosis Date   Anemia    Dementia (HCC)    Stroke [facility])    Past Surgical History:  Procedure Laterality Date   INTRAMEDULLARY (IM) NAIL INTERTROCHANTERIC Right 09/26/2023   Procedure: INTRAMEDULLARY (IM) NAIL INTERTROCHANTERIC;   Surgeon: Roby Lofts, MD;  Location: [facility] OR;  Service: Orthopedics;  Laterality: Right;   ORIF FEMUR FRACTURE Right 10/24/2023   Procedure: OPEN REDUCTION INTERNAL FIXATION FEMORAL SHAFT FRACTURE;  Surgeon: Roby Lofts, MD;  Location: [facility] OR;  Service: Orthopedics;  Laterality: Right;   Social History:  reports that he has been smoking cigarettes. He does not have any smokeless tobacco history on file. He reports that he does not drink alcohol and does not use drugs.  Allergies  Allergen Reactions   Fentanyl And Related Anaphylaxis   Other Other (See Comments)    According to the patient, all opioids cause hallucinations   Prednisone Other (See Comments)    Hallucinations    Codeine Other (See Comments)    Hallucinations   Morphine Other (See Comments)    Hallucinations   Oxycodone Other (See Comments)    Hallucinations    Family History  Problem Relation Age of Onset   Dementia Mother    Parkinsonism Father    Stroke Father    Breast cancer Sister      Prior to Admission medications   Medication Sig Start Date End Date Taking? Authorizing Provider  acetaminophen (TYLENOL) 160 MG/5ML liquid Take 10 mLs by mouth every 6 (six) hours as needed for pain.    [provider]  ascorbic acid (VITAMIN C) 500 MG tablet Take 500 mg by mouth daily.    [provider]  carbidopa-levodopa (SINEMET IR) 25-100 MG tablet Take 1 tablet by mouth 3 (three) times daily with meals. 08/14/23  Dohmeier, Porfirio Mylar, MD  docusate sodium (COLACE) 100 MG capsule Take 1 capsule (100 mg total) by mouth daily. 10/29/23   Pokhrel, Rebekah Chesterfield, MD  feeding supplement (ENSURE ENLIVE / ENSURE PLUS) LIQD Take 237 mLs by mouth 2 (two) times daily between meals. 10/29/23   Pokhrel, Rebekah Chesterfield, MD  fluticasone (FLONASE) 50 MCG/ACT nasal spray Place 1 spray into both nostrils daily. 10/01/23   Jerald Kief, MD  Ibuprofen 40 MG/ML SUSP Take 5 mLs by mouth 2 (two) times daily.    [provider]   memantine (NAMENDA) 5 MG tablet Take 1 tablet (5 mg total) by mouth 2 (two) times daily. 09/03/23   Dohmeier, Porfirio Mylar, MD  Multiple Vitamin (MULTIVITAMIN WITH MINERALS) TABS tablet Take 1 tablet by mouth daily. 10/30/23   Pokhrel, Rebekah Chesterfield, MD  polyethylene glycol (MIRALAX / GLYCOLAX) 17 g packet Take 17 g by mouth daily as needed for mild constipation. 09/30/23   Jerald Kief, MD  QUEtiapine (SEROQUEL) 25 MG tablet Take 1 tablet (25 mg total) by mouth at bedtime. 09/30/23 11/07/23 Yes Jerald Kief, MD    Physical Exam: BP 106/61   Pulse 85   Temp (!) 96.6 F (35.9 C)   Resp 10   SpO2 100%  General: Patient is incredibly emaciated, looks much older than his stated age.  Mucous membranes are incredibly dry, there is some evidence of oropharyngeal thrush. Cardiovascular: RRR, no murmurs or rubs, no peripheral edema  Respiratory: clear to auscultation bilaterally, no wheezes, no crackles  Abdomen: soft, nontender, nondistended, normal bowel tones heard  Skin: dry, no rashes  GU: Foley catheter in place with very concentrated urine. Musculoskeletal: no joint effusions, normal range of motion  Neurologic: He is essentially unresponsive, responds to tactile stimulus but not following commands.         Labs on Admission:  Basic Metabolic Panel: Recent Labs  Lab 11/07/23 1215  NA 152*  K 4.2  CL 120*  CO2 21*  GLUCOSE 121*  BUN 141*  CREATININE 4.17*  CALCIUM 8.3*   Liver Function Tests: Recent Labs  Lab 11/07/23 1215  AST 37  ALT 27  ALKPHOS 76  BILITOT 0.8  PROT 6.0*  ALBUMIN 2.7*   No results for input(s): "LIPASE", "AMYLASE" in the last 168 hours. No results for input(s): "AMMONIA" in the last 168 hours. CBC: Recent Labs  Lab 11/07/23 1215  WBC 7.0  NEUTROABS 4.9  HGB 10.6*  HCT 34.6*  MCV 105.2*  PLT 514*   Cardiac Enzymes: No results for input(s): "CKTOTAL", "CKMB", "CKMBINDEX", "TROPONINI" in the last 168 hours. BNP (last 3 results) No results for  input(s): "BNP" in the last 8760 hours.  ProBNP (last 3 results) No results for input(s): "PROBNP" in the last 8760 hours.  CBG: No results for input(s): "GLUCAP" in the last 168 hours.  Radiological Exams on Admission: CT Head Wo Contrast Result Date: 11/07/2023 CLINICAL DATA:  Mental status change EXAM: CT HEAD WITHOUT CONTRAST TECHNIQUE: Contiguous axial images were obtained from the base of the skull through the vertex without intravenous contrast. RADIATION DOSE REDUCTION: This exam was performed according to the departmental dose-optimization program which includes automated exposure control, adjustment of the mA and/or kV according to patient size and/or use of iterative reconstruction technique. COMPARISON:  Head CT 10/24/2023 FINDINGS: Brain: No evidence of acute infarction, hemorrhage, hydrocephalus, extra-axial collection or mass lesion/mass effect. There is mild periventricular white matter hypodensity, likely chronic small vessel ischemic change. Vascular: No hyperdense vessel or unexpected  calcification. Skull: Normal. Negative for fracture or focal lesion. Sinuses/Orbits: There is stable complete opacification of the right maxillary sinus with some central hyperdense material. Mastoid air cells are clear. Orbits are within normal limits. Other: None. IMPRESSION: 1. No acute intracranial process. 2. Mild chronic small vessel ischemic change. 3. Stable complete opacification of the right maxillary sinus with some central hyperdense material, possibly inspissated secretions or fungal colonization. Electronically Signed   By: Darliss Cheney M.D.   On: 11/07/2023 15:29   DG Chest Port 1 View Result Date: 11/07/2023 CLINICAL DATA:  Sepsis. EXAM: PORTABLE CHEST 1 VIEW COMPARISON:  October 06, 2023. FINDINGS: The heart size and mediastinal contours are within normal limits. Both lungs are clear. The visualized skeletal structures are unremarkable. IMPRESSION: No active disease. Electronically Signed    By: Lupita Raider M.D.   On: 11/07/2023 13:40   Assessment/Plan Ernest Campbell is a 73 y.o. 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  Hypernatremia-likely due to severe dehydration from poor oral intake, overall the patient is quite somnolent, has been declining over the last few months and seems to have very little oral intake. -Inpatient admission to progressive unit -Hydrate aggressively with LR at 125 cc/h -Consult RD -Potentially may need feeding tube or other modality, pending goals of care discussion  Hypotension-likely due to severe dehydration, continue IV fluid resuscitation  UTI-apparently recently treated in the outpatient setting, but has failed to improve -Follow-up blood and urine cultures -For now we will treat broadly with empiric IV Zosyn, would also cover for other potential infections such as aspiration  Oral thrush-oral fluconazole daily  AKI-I feel this is likely the result of combination of severe dehydration, acute infection (UTI), as well as hypotension.  He is making urine, which is very concentrated.  Will need to rule out obstruction. -Will hydrate aggressively -Treat suspected UTI as above with empiric broad-spectrum antibiotics -Check renal ultrasound to rule out obstruction  Severe failure to thrive-patient is currently a full code, patient and family would benefit from goals of care discussion -Palliative care consult  History of TBI with muscle rigidity and cognitive deficits-continue Sinemet and Namenda  Chronic anemia-appears to be at baseline  DVT prophylaxis: Heparin subcu    Code Status: Full Code  Consults called: Palliative care  Admission status: The  appropriate patient status for this patient is INPATIENT. Inpatient status is judged to be reasonable and necessary in order to provide the required intensity of service to ensure the patient's safety. The patient's presenting symptoms, physical exam findings, and initial radiographic and laboratory data in the context of their chronic comorbidities is felt to place them at high risk for further clinical deterioration. Furthermore, it is not anticipated that the patient will be medically stable for discharge from the hospital within 2 midnights of admission.    I certify that at the point of admission it is my clinical judgment that the patient will require inpatient hospital care spanning beyond 2 midnights from the point of admission due to high intensity of service, high risk for further deterioration and high frequency of surveillance required  Time spent: 48 minutes  Brittaney Beaulieu Sharlette Dense MD [facility] Pager [phone]  If 7PM-7AM, please contact night-coverage [email or web address] Password [facility]  11/07/2023,  4:46 PM

## 2023-11-07 NOTE — Progress Notes (Signed)
 ED Pharmacy Antibiotic Sign Off An antibiotic consult was received from an ED provider for Zosyn per pharmacy dosing for aspiration pneumonia. A chart review was completed to assess appropriateness.   The following one time order(s) were placed:  Zosyn 3.375 g  Further antibiotic and/or antibiotic pharmacy consults should be ordered by the admitting provider if indicated.   Thank you for allowing pharmacy to be a part of this patient's care.   Pricilla Riffle, PharmD, BCPS Clinical Pharmacist 11/07/2023 4:48 PM

## 2023-11-07 NOTE — ED Notes (Signed)
Sacral wound present

## 2023-11-07 NOTE — Sepsis Progress Note (Signed)
 Sepsis protocol monitored by eLink ?

## 2023-11-07 NOTE — ED Triage Notes (Signed)
 Patient presents from [facility]. Over the past few days, the facility has noticed steady decline/ failure to thrive and altered mental status. He was recently diagnosed with a  UTI 3 days ago, and is taking Rocephin. Responds to painful stimuli.  EMS vitals: 15 GCS 108 P 24 RR 21 CAp 60/30 initial BP 86/40 Final BP 147 CBG

## 2023-11-07 NOTE — ED Provider Notes (Signed)
 [facility] EMERGENCY DEPARTMENT AT [facility] Provider Note   CSN: [id number] Arrival date & time: 11/07/23  1123     History  Chief Complaint  Patient presents with   Altered Mental Status    Ernest Campbell is a 73 y.o. male who presents emergency department for altered mentation and hypotension.  He is currently only responsive to pain.  He is unable to provide any history.  Patient arrives with [facility].  States that he is was hypotensive into the 80s all day yesterday.  It appears his baseline blood pressures in the 130s.  Recent urinary tract infection and femur fracture.  History of CVA.  It documents that they are checking wounds daily however I do not see that he has a history of documented wounds and on evaluation he clearly has bilateral pressure ulcers of his feet.  No other information is known at this time.   Altered Mental Status      Home Medications Prior to Admission medications   Medication Sig Start Date End Date Taking? Authorizing Provider  acetaminophen (TYLENOL) 160 MG/5ML liquid Take 10 mLs by mouth every 6 (six) hours as needed for pain.    [provider]  ascorbic acid (VITAMIN C) 500 MG tablet Take 500 mg by mouth daily.    [provider]  carbidopa-levodopa (SINEMET IR) 25-100 MG tablet Take 1 tablet by mouth 3 (three) times daily with meals. 08/14/23   Dohmeier, Porfirio Mylar, MD  docusate sodium (COLACE) 100 MG capsule Take 1 capsule (100 mg total) by mouth daily. 10/29/23   Pokhrel, Rebekah Chesterfield, MD  feeding supplement (ENSURE ENLIVE / ENSURE PLUS) LIQD Take 237 mLs by mouth 2 (two) times daily between meals. 10/29/23   Pokhrel, Rebekah Chesterfield, MD  fluticasone (FLONASE) 50 MCG/ACT nasal spray Place 1 spray into both nostrils daily. 10/01/23   Jerald Kief, MD  Ibuprofen 40 MG/ML SUSP Take 5 mLs by mouth 2 (two) times daily.    [provider]  memantine (NAMENDA) 5 MG tablet Take 1 tablet (5 mg total) by mouth 2 (two) times daily. 09/03/23    Dohmeier, Porfirio Mylar, MD  Multiple Vitamin (MULTIVITAMIN WITH MINERALS) TABS tablet Take 1 tablet by mouth daily. 10/30/23   Pokhrel, Rebekah Chesterfield, MD  polyethylene glycol (MIRALAX / GLYCOLAX) 17 g packet Take 17 g by mouth daily as needed for mild constipation. 09/30/23   Jerald Kief, MD  QUEtiapine (SEROQUEL) 25 MG tablet Take 1 tablet (25 mg total) by mouth at bedtime. 09/30/23 10/30/23  Jerald Kief, MD      Allergies    Fentanyl and related, Other, Prednisone, Codeine, Morphine, and Oxycodone    Review of Systems   Review of Systems  Physical Exam Updated Vital Signs BP (!) 85/52   Pulse 85   Temp 98 F (36.7 C) (Rectal)   Resp 16   SpO2 100%  Physical Exam Vitals reviewed.  Constitutional:      Appearance: He is underweight. He is ill-appearing.  HENT:     Head: Atraumatic.     Mouth/Throat:     Mouth: Mucous membranes are dry.  Eyes:     Extraocular Movements: Extraocular movements intact.     Pupils: Pupils are equal, round, and reactive to light.  Cardiovascular:     Rate and Rhythm: Normal rate.  Pulmonary:     Effort: Pulmonary effort is normal.     Breath sounds: Normal breath sounds.  Abdominal:     General: Abdomen is  flat. There is no distension.     Tenderness: There is no guarding.  Musculoskeletal:        General: No swelling or deformity.     Cervical back: Normal range of motion.  Skin:    Comments: BL heel wounds  Neurological:     Mental Status: He is lethargic.     GCS: GCS eye subscore is 2. GCS verbal subscore is 1. GCS motor subscore is 5.     ED Results / Procedures / Treatments   Labs (all labs ordered are listed, but only abnormal results are displayed) Labs Reviewed  RESP PANEL BY RT-PCR (RSV, FLU A&B, COVID)  RVPGX2  CULTURE, BLOOD (ROUTINE X 2)  CULTURE, BLOOD (ROUTINE X 2)  COMPREHENSIVE METABOLIC PANEL WITH GFR  CBC WITH DIFFERENTIAL/PLATELET  PROTIME-INR  APTT  URINALYSIS, W/ REFLEX TO CULTURE (INFECTION SUSPECTED)  I-STAT CG4  LACTIC ACID, ED    EKG None  Radiology No results found.  Procedures .Critical Care  Performed by: Arthor Captain, PA-C Authorized by: Arthor Captain, PA-C   Critical care provider statement:    Critical care time (minutes):  75   Critical care time was exclusive of:  Separately billable procedures and treating other patients   Critical care was necessary to treat or prevent imminent or life-threatening deterioration of the following conditions:  Renal failure and dehydration   Critical care was time spent personally by me on the following activities:  Development of treatment plan with patient or surrogate, discussions with consultants, evaluation of patient's response to treatment, examination of patient, ordering and review of laboratory studies, ordering and review of radiographic studies, ordering and performing treatments and interventions, pulse oximetry, re-evaluation of patient's condition and review of old charts   Care discussed with: admitting provider       Medications Ordered in ED Medications  cefTRIAXone (ROCEPHIN) 2 g in sodium chloride 0.9 % 100 mL IVPB (2 g Intravenous New Bag/Given 11/07/23 1204)  sodium chloride 0.9 % bolus 2,000 mL (2,000 mLs Intravenous New Bag/Given 11/07/23 1203)    ED Course/ Medical Decision Making/ A&P Clinical Course as of 11/09/23 1440  Thu Nov 07, 2023  1308 Discussed with the patient's wife via phone call.  I also attempted to speak with the nursing staff at his facility however I was hung up on twice and unable to speak with the nurse.  I did review the patient's MAR.  Per his wife at baseline he does not speak much and sleeps with his eyes open.  She states that he is also been more somnolent because he was placed on Seroquel due to being agitated at night. She notes that last week he was way more somnolent and she was asking the nursing staff to check a urine on him repeatedly.  She states they finally did and found that he had urine  infection that was positive on culture.  He is currently on IM Rocephin.  She states that yesterday was his most alert day.  He apparently had a significant change within the past 24 hours however review of his MAR shows that he was both hypotensive and tachycardic all day yesterday.  She does report he has known ulcers on his back and heels.  She also reports that he is full code. [AH]  2130 Sodium(!): 152 [AH]    Clinical Course User Index [AH] Arthor Captain, PA-C  Medical Decision Making Amount and/or Complexity of Data Reviewed Labs: ordered. Decision-making details documented in ED Course. Radiology: ordered.  Risk OTC drugs. Decision regarding hospitalization.   This patient presents to the ED for concern of Altered mental Status, this involves an extensive number of treatment options, and is a complaint that carries with it a high risk of complications and morbidity.  .  The differential diagnosis for AMS is extensive and includes, but is not limited to: drug overdose - opioids, alcohol, sedatives, antipsychotics, drug withdrawal, others; Metabolic: hypoxia, hypoglycemia, hyperglycemia, hypercalcemia, hypernatremia, hyponatremia, uremia, hepatic encephalopathy, hypothyroidism, hyperthyroidism, vitamin B12 or thiamine deficiency, carbon monoxide poisoning, Wilson's disease, Lactic acidosis, DKA/HHOS; Infectious: meningitis, encephalitis, bacteremia/sepsis, urinary tract infection, pneumonia, neurosyphilis; Structural: Space-occupying lesion, (brain tumor, subdural hematoma, hydrocephalus,); Vascular: stroke, subarachnoid hemorrhage, coronary ischemia, hypertensive encephalopathy, CNS vasculitis, thrombotic thrombocytopenic purpura, disseminated intravascular coagulation, hyperviscosity; Psychiatric: Schizophrenia, depression; Other: Seizure, hypothermia, heat stroke, ICU psychosis, dementia -"sundowning."   Co morbidities:    has a past medical history  of Anemia, Dementia (HCC), and Stroke (HCC).   Social Determinants of Health:    Resident of SNF  Additional history:  {Additional history obtained from wife by phone {External records from outside source obtained and reviewed including written MAR   Lab Tests:  I Ordered, and personally interpreted labs.  The pertinent results include:   Sodium 152 Cr 4.17>0.82 BUN 141 WBC 7.0 UA appears infected   Imaging Studies:  I ordered imaging studies including CT head, cxr I independently visualized and interpreted imaging which showed No acute findings I agree with the radiologist interpretation  Cardiac Monitoring/ECG:  The patient was maintained on a cardiac monitor.  I personally viewed and interpreted the cardiac monitored which showed an underlying rhythm of: NSR  Medicines ordered and prescription drug management:  I ordered medication including  Ceftriaxone, tylenol, Zosyn, LR for Dehydration, urine infection , severe dehydration Reevaluation of the patient after these medicines showed that the patient stayed the same I have reviewed the patients home medicines and have made adjustments as needed  Test Considered:    Critical Interventions:  fluids, abx  Consultations Obtained: Dr. Erenest Blank for admison  Problem List / ED Course:     ICD-10-CM   1. Altered mental status, unspecified altered mental status type  R41.82     2. Hypernatremia  E87.0     3. Dehydration  E86.0     4. AKI (acute kidney injury) (HCC)  N17.9     5. Hypothermia, initial encounter  T68.Lorne Skeens       MDM: patient with uti, massive dehydraton, hypothermia. Bair hugger placed with improvement   Dispostion:  After consideration of the diagnostic results and the patients response to treatment, I feel that the patent would benefit from admission.         Final Clinical Impression(s) / ED Diagnoses Final diagnoses:  None    Rx / DC Orders ED Discharge Orders      None         Arthor Captain, PA-C 11/09/23 1519    Bethann Berkshire, MD 11/10/23 [phone]

## 2023-11-07 NOTE — ED Notes (Signed)
 Dr. Estell Harpin made aware of BP 85/52.

## 2023-11-07 NOTE — Progress Notes (Signed)
   11/07/23 1813  Assess: MEWS Score  Temp 98.2 F (36.8 C)  BP (!) 85/42  MAP (mmHg) (!) 55  Pulse Rate 76  Level of Consciousness Responds to Pain  SpO2 99 %  O2 Device Room Air  Assess: MEWS Score  MEWS Temp 0  MEWS Systolic 1  MEWS Pulse 0  MEWS RR 1  MEWS LOC 2  MEWS Score 4  MEWS Score Color Red  Assess: if the MEWS score is Yellow or Red  Were vital signs accurate and taken at a resting state? Yes  Does the patient meet 2 or more of the SIRS criteria? Yes  Does the patient have a confirmed or suspected source of infection? Yes  MEWS guidelines implemented  Yes, red  Treat  MEWS Interventions Considered administering scheduled or prn medications/treatments as ordered  Take Vital Signs  Increase Vital Sign Frequency  Red: Q1hr x2, continue Q4hrs until patient remains green for 12hrs  Escalate  MEWS: Escalate Red: Discuss with charge nurse and notify provider. Consider notifying RRT. If remains red for 2 hours consider need for higher level of care  Notify: Charge Nurse/RN  Name of Charge Nurse/RN Notified Dagoberto Ligas RN  Provider Notification  Provider Name/Title Dr. Luciano Cutter  Date Provider Notified 11/07/23  Time Provider Notified 1900  Method of Notification Page  Notification Reason Other (Comment) (red mews)  Provider response No new orders  Date of Provider Response 11/07/23  Time of Provider Response 1914  Notify: Rapid Response  Name of Rapid Response RN Notified Zoe RN  Date Rapid Response Notified 11/07/23  Time Rapid Response Notified 1900  Assess: SIRS CRITERIA  SIRS Temperature  0  SIRS Respirations  0  SIRS Pulse 0  SIRS WBC 0  SIRS Score Sum  0   Patient RED MEWS upon admit, VS unchanged from ED and LOC unchanged. MD made aware as well as RR. Patient getting bolus at this time

## 2023-11-08 DIAGNOSIS — E870 Hyperosmolality and hypernatremia: Secondary | ICD-10-CM | POA: Diagnosis not present

## 2023-11-08 LAB — BASIC METABOLIC PANEL WITH GFR
Anion gap: 6 (ref 5–15)
Anion gap: 6 (ref 5–15)
Anion gap: 9 (ref 5–15)
BUN: 79 mg/dL — ABNORMAL HIGH (ref 8–23)
BUN: 84 mg/dL — ABNORMAL HIGH (ref 8–23)
BUN: 93 mg/dL — ABNORMAL HIGH (ref 8–23)
CO2: 19 mmol/L — ABNORMAL LOW (ref 22–32)
CO2: 20 mmol/L — ABNORMAL LOW (ref 22–32)
CO2: 20 mmol/L — ABNORMAL LOW (ref 22–32)
Calcium: 7.2 mg/dL — ABNORMAL LOW (ref 8.9–10.3)
Calcium: 8.1 mg/dL — ABNORMAL LOW (ref 8.9–10.3)
Calcium: 8.1 mg/dL — ABNORMAL LOW (ref 8.9–10.3)
Chloride: 121 mmol/L — ABNORMAL HIGH (ref 98–111)
Chloride: 125 mmol/L — ABNORMAL HIGH (ref 98–111)
Chloride: 126 mmol/L — ABNORMAL HIGH (ref 98–111)
Creatinine, Ser: 1.82 mg/dL — ABNORMAL HIGH (ref 0.61–1.24)
Creatinine, Ser: 1.88 mg/dL — ABNORMAL HIGH (ref 0.61–1.24)
Creatinine, Ser: 2.15 mg/dL — ABNORMAL HIGH (ref 0.61–1.24)
GFR, Estimated: 32 mL/min — ABNORMAL LOW (ref >60)
GFR, Estimated: 38 mL/min — ABNORMAL LOW (ref >60)
GFR, Estimated: 39 mL/min — ABNORMAL LOW (ref >60)
Glucose, Bld: 85 mg/dL (ref 70–99)
Glucose, Bld: 87 mg/dL (ref 70–99)
Glucose, Bld: 87 mg/dL (ref 70–99)
Potassium: 3.6 mmol/L (ref 3.5–5.1)
Potassium: 3.9 mmol/L (ref 3.5–5.1)
Potassium: 4.3 mmol/L (ref 3.5–5.1)
Sodium: 147 mmol/L — ABNORMAL HIGH (ref 135–145)
Sodium: 152 mmol/L — ABNORMAL HIGH (ref 135–145)
Sodium: 153 mmol/L — ABNORMAL HIGH (ref 135–145)

## 2023-11-08 LAB — URINALYSIS, W/ REFLEX TO CULTURE (INFECTION SUSPECTED)
Bilirubin Urine: NEGATIVE
Glucose, UA: NEGATIVE mg/dL
Ketones, ur: NEGATIVE mg/dL
Nitrite: NEGATIVE
Protein, ur: NEGATIVE mg/dL
Specific Gravity, Urine: 1.017 (ref 1.005–1.030)
WBC, UA: >50 WBC/hpf (ref 0–5)
pH: 5 (ref 5.0–8.0)

## 2023-11-08 LAB — CBC
HCT: 26.6 % — ABNORMAL LOW (ref 39.0–52.0)
Hemoglobin: 7.6 g/dL — ABNORMAL LOW (ref 13.0–17.0)
MCH: 32.1 pg (ref 26.0–34.0)
MCHC: 28.6 g/dL — ABNORMAL LOW (ref 30.0–36.0)
MCV: 112.2 fL — ABNORMAL HIGH (ref 80.0–100.0)
Platelets: 343 10*3/uL (ref 150–400)
RBC: 2.37 MIL/uL — ABNORMAL LOW (ref 4.22–5.81)
RDW: 14.7 % (ref 11.5–15.5)
WBC: 7.5 10*3/uL (ref 4.0–10.5)
nRBC: 0 % (ref 0.0–0.2)

## 2023-11-08 LAB — GLUCOSE, CAPILLARY
Glucose-Capillary: 77 mg/dL (ref 70–99)
Glucose-Capillary: 92 mg/dL (ref 70–99)

## 2023-11-08 MED ORDER — ORAL CARE MOUTH RINSE
15.0000 mL | OROMUCOSAL | Status: DC
  Administered 2023-11-08 – 2023-11-12 (×16): 15 mL via OROMUCOSAL

## 2023-11-08 MED ORDER — ZINC OXIDE 40 % EX OINT
TOPICAL_OINTMENT | Freq: Two times a day (BID) | CUTANEOUS | Status: DC
  Filled 2023-11-08: qty 57

## 2023-11-08 MED ORDER — CHLORHEXIDINE GLUCONATE CLOTH 2 % EX PADS
6.0000 | MEDICATED_PAD | Freq: Every day | CUTANEOUS | Status: DC
  Administered 2023-11-08 – 2023-11-11 (×4): 6 via TOPICAL

## 2023-11-08 MED ORDER — ENSURE ENLIVE PO LIQD: 237.0000 mL | Freq: Three times a day (TID) | ORAL | Status: DC

## 2023-11-08 MED ORDER — LACTATED RINGERS IV BOLUS
500.0000 mL | Freq: Once | INTRAVENOUS | Status: AC
  Administered 2023-11-08: 500 mL via INTRAVENOUS

## 2023-11-08 MED ORDER — MEDIHONEY WOUND/BURN DRESSING EX PSTE
1.0000 | PASTE | Freq: Every day | CUTANEOUS | Status: DC
  Administered 2023-11-08 – 2023-11-12 (×5): 1 via TOPICAL
  Filled 2023-11-08: qty 44

## 2023-11-08 MED ORDER — PIPERACILLIN-TAZOBACTAM 3.375 G IVPB
3.3750 g | Freq: Three times a day (TID) | INTRAVENOUS | Status: DC
  Administered 2023-11-08 – 2023-11-12 (×13): 3.375 g via INTRAVENOUS
  Filled 2023-11-08 (×13): qty 50

## 2023-11-08 MED ORDER — DEXTROSE 5 % IV SOLN: INTRAVENOUS | Status: AC

## 2023-11-08 MED ORDER — ORAL CARE MOUTH RINSE: 15.0000 mL | OROMUCOSAL | Status: DC | PRN

## 2023-11-08 NOTE — Consult Note (Signed)
 Consultation Note Date: 11/08/2023   Patient Name: Ernest Campbell  DOB: 02/25/1951  MRN: [id number]  Age / Sex: 73 y.o., male  PCP: Margot Ables, MD (Inactive) Referring Physician: Burnadette Pop, MD  Reason for Consultation: Establishing goals of care  HPI/Patient Profile: 73 y.o. male   admitted on 11/07/2023.   Clinical Assessment and Goals of Care: 72 year old gentleman came from [place] though he also, history of stroke traumatic brain injury, to discuss his macrocytic anemia frequent falls and recent femur fracture. Patient admitted with severe dehydration acute kidney injury suspicion for urinary tract infection. Patient admitted to hospital medicine service started on antibiotics antifungal and IV fluids, antibiotics and speech therapy also consulted for p.o. diet recommendations. Palliative consult for CODE STATUS about goals of care discussions. Chart reviewed, patient seen and examined, discussed with the RN colleague who present at the bedside.  Patient awake alert but does not know nor do verbalize or respond.  Call placed and was able to reach patient's wife-see below Palliative medicine is specialized medical care for people living with serious illness. It focuses on providing relief from the symptoms and stress of a serious illness. The goal is to improve quality of life for both the patient and the family. Goals of care: Broad aims of medical therapy in relation to the patient's values and preferences. Our aim is to provide medical care aimed at enabling patients to achieve the goals that matter most to them, given the circumstances of their particular medical situation and their constraints.   NEXT OF KIN  Wife   SUMMARY OF RECOMMENDATIONS   Goals of care discussions: Call patient was able to reach patient's wife, introduced scope of palliative services.  Patient's wife was taken  aback and asked why palliative care was consulted.  She wishes to continue with full code full scope care.  She states that up until very recently, the patient was  quite active, had a good appetite and had a good quality of life.  She hopes to for ongoing stabilization/recovery.  She wanted Seroquel discontinued from the patient's medication list.  All of her questions and concerns are addressed to the best of my ability.  Palliative will follow perioperatively. Thank you for the consult. Code Status/Advance Care Planning: Full code   Symptom Management:    Palliative Prophylaxis:  Delirium Protocol  Additional Recommendations (Limitations, Scope, Preferences): Full Scope Treatment  Psycho-social/Spiritual:  Desire for further Chaplaincy support:yes Additional Recommendations: Caregiving  Support/Resources  Prognosis:  Unable to determine  Discharge Planning: To Be Determined      Primary Diagnoses: Present on Admission:  Hypernatremia   I have reviewed the medical record, interviewed the patient and family, and examined the patient. The following aspects are pertinent.  Past Medical History:  Diagnosis Date   Anemia    Dementia (HCC)    Stroke [facility])    Social History   Socioeconomic History   Marital status: Married    Spouse name: Not on file   Number of children: Not on  file   Years of education: Not on file   Highest education level: Not on file  Occupational History   Not on file  Tobacco Use   Smoking status: Every Day    Current packs/day: 0.50    Types: Cigarettes   Smokeless tobacco: Not on file  Substance and Sexual Activity   Alcohol use: Never   Drug use: Never   Sexual activity: Not on file  Other Topics Concern   Not on file  Social History Narrative   Lives with wife    Pt retired    Chief Executive Officer Drivers of Corporate investment banker Strain: Low Risk  (10/20/2022)   Received from [employer], [facility]   Overall Financial Resource  Strain (CARDIA)    Difficulty of Paying Living Expenses: Not hard at all  Food Insecurity: No Food Insecurity (11/07/2023)   Hunger Vital Sign    Worried About Running Out of Food in the Last Year: Never true    Ran Out of Food in the Last Year: Never true  Transportation Needs: No Transportation Needs (11/07/2023)   PRAPARE - Administrator, Civil Service (Medical): No    Lack of Transportation (Non-Medical): No  Physical Activity: Unknown (10/20/2022)   [facility] from [facility], [facility]   Exercise Vital Sign    Days of Exercise per Week: Patient declined    Minutes of Exercise per Session: 30 min  Stress: Patient Declined (10/20/2022)   Received from [facility], [facility] - Occupational Stress Questionnaire    Feeling of Stress : Patient declined  Social Connections: Unknown (11/07/2023)   Social Connection and Isolation Panel [NHANES]    Frequency of Communication with Friends and Family: Once a week    Frequency of Social Gatherings with Friends and Family: Once a week    Attends Religious Services: Patient unable to answer    Active Member of Clubs or Organizations: Patient unable to answer    Attends Banker Meetings: Patient unable to answer    Marital Status: Married  Recent Concern: Social Connections - Moderately Isolated (10/24/2023)   Social Connection and Isolation Panel [NHANES]    Frequency of Communication with Friends and Family: More than three times a week    Frequency of Social Gatherings with Friends and Family: Three times a week    Attends Religious Services: Never    Active Member of Clubs or Organizations: No    Attends Engineer, structural: Never    Marital Status: Married   Family History  Problem Relation Age of Onset   Dementia Mother    Parkinsonism Father    Stroke Father    Breast cancer Sister    Scheduled Meds:  aspirin EC  325 mg Oral Daily    Carbidopa-Levodopa ER  1 tablet Oral TID   docusate sodium  100 mg Oral QPM   feeding supplement  237 mL Oral TID BM   fluconazole  100 mg Oral Daily   fluticasone  1 spray Each Nare Daily   heparin  5,000 Units Subcutaneous Q8H   leptospermum manuka honey  1 Application Topical Daily   liver oil-zinc oxide   Topical BID   memantine  5 mg Oral BID   mouth rinse  15 mL Mouth Rinse 4 times per day   QUEtiapine  25 mg Oral QHS   Continuous Infusions:  dextrose 75 mL/hr at 11/08/23 0904   piperacillin-tazobactam (  ZOSYN)  IV 3.375 g (11/08/23 1051)   PRN Meds:.acetaminophen **OR** acetaminophen, albuterol, ondansetron **OR** ondansetron (ZOFRAN) IV, mouth rinse, polyethylene glycol Medications Prior to Admission:  Prior to Admission medications   Medication Sig Start Date End Date Taking? Authorizing Provider  acetaminophen (TYLENOL) 650 MG suppository Place 650 mg rectally every 6 (six) hours as needed for fever (or pain).   Yes [provider]  aspirin 81 MG chewable tablet Chew 81 mg by mouth daily.   Yes [provider]  aspirin EC 325 MG tablet Take 325 mg by mouth daily. 10/31/23 11/28/23 Yes [provider]  Carbidopa-Levodopa ER (SINEMET CR) 25-100 MG tablet controlled release Take 1 tablet by mouth See admin instructions. Take 1 tablet by mouth at 9 AM, 3 PM, and 9 PM   Yes [provider]  cefTRIAXone (ROCEPHIN) 1 g injection Inject 1 g into the muscle daily. 11/05/23 11/10/23 Yes [provider]  docusate sodium (COLACE) 100 MG capsule Take 1 capsule (100 mg total) by mouth daily. Patient taking differently: Take 100 mg by mouth every evening. 10/29/23  Yes Pokhrel, Laxman, MD  fluticasone (FLONASE) 50 MCG/ACT nasal spray Place 1 spray into both nostrils daily. 10/01/23  Yes Jerald Kief, MD  ibuprofen (ADVIL) 200 MG tablet Take 200 mg by mouth 2 (two) times daily.   Yes [provider]  lidocaine 4 % Place 1 patch onto the skin See  admin instructions. Apply 1 new patch to the lower back at 10 AM and remove at 10 PM   Yes [provider]  memantine (NAMENDA) 5 MG tablet Take 1 tablet (5 mg total) by mouth 2 (two) times daily. 09/03/23  Yes Dohmeier, Porfirio Mylar, MD  Multiple Vitamin (MULTIVITAMIN WITH MINERALS) TABS tablet Take 1 tablet by mouth daily. 10/30/23  Yes Pokhrel, Laxman, MD  NUTRITIONAL SUPPLEMENTS PO Take 120 mLs by mouth See admin instructions. MedPass: Drink 120 ml's by mouth at 9 AM, 3 PM, and 9 PM   Yes [provider]  nystatin (MYCOSTATIN) 100000 UNIT/ML suspension Take 5 mLs by mouth See admin instructions. Swish and swallow 5 ml's by mouth 4 times a day 11/06/23 11/11/23 Yes [provider]  polyethylene glycol (MIRALAX / GLYCOLAX) 17 g packet Take 17 g by mouth daily as needed for mild constipation. 09/30/23  Yes Jerald Kief, MD  QUEtiapine (SEROQUEL) 25 MG tablet Take 1 tablet (25 mg total) by mouth at bedtime. Patient taking differently: Take 12.5 mg by mouth at bedtime. 09/30/23 11/07/23 Yes Jerald Kief, MD  carbidopa-levodopa (SINEMET IR) 25-100 MG tablet Take 1 tablet by mouth 3 (three) times daily with meals. Patient not taking: Reported on 11/07/2023 08/14/23   Dohmeier, Porfirio Mylar, MD  feeding supplement (ENSURE ENLIVE / ENSURE PLUS) LIQD Take 237 mLs by mouth 2 (two) times daily between meals. Patient not taking: Reported on 11/07/2023 10/29/23   Joycelyn Das, MD   Allergies  Allergen Reactions   Fentanyl And Related Anaphylaxis   Other Other (See Comments)    According to the patient, all opioids cause hallucinations    Prednisone Other (See Comments)    Hallucinations - "Allergic," per [facility]   Codeine Other (See Comments)    Hallucinations - "Allergic," per [facility]   Morphine Other (See Comments)    Hallucinations - "Allergic," per [facility]   Oxycodone Other (See Comments)    Hallucinations - "Allergic," per [facility]   Review of Systems +weakness  Physical Exam Patient is resting in  bed Awake alert Attempts to mumble and follow commands but does not verbalize Appears fairly built No acute distress Abdomen not distended  Vital Signs: BP (!) 100/56 (BP Location: Left Arm)   Pulse 78   Temp 98 F (36.7 C) (Axillary)   Resp 18   Ht 5\' 5"  (1.651 m)   Wt 50.8 kg   SpO2 98%   BMI 18.64 kg/m  Pain Scale: PAINAD   Pain Score: 0-No pain   SpO2: SpO2: 98 % O2 Device:SpO2: 98 % O2 Flow Rate: .O2 Flow Rate (L/min): 2 L/min  IO: Intake/output summary:  Intake/Output Summary (Last 24 hours) at 11/08/2023 1057 Last data filed at 11/08/2023 0900 Gross per 24 hour  Intake 5519.36 ml  Output 2300 ml  Net 3219.36 ml    LBM: Last BM Date : 11/07/23 Baseline Weight: Weight: 50.8 kg Most recent weight: Weight: 50.8 kg     Palliative Assessment/Data:   PPS 40%  Time In: 10 Time Out:  11 Time Total:  60 Greater than 50%  of this time was spent counseling and coordinating care related to the above assessment and plan.  Signed by: Rosalin Hawking, MD   Please contact Palliative Medicine Team phone at [phone] for questions and concerns.  For individual provider: See Loretha Stapler

## 2023-11-08 NOTE — TOC Initial Note (Signed)
 Transition of Care [facility]) - Initial/Assessment Note   Patient Details  Name: Ernest Campbell MRN: [id number] Date of Birth: 12-15-1950  [facility] CM/SW Contact:    Ewing Schlein, LCSW Phone Number: 11/08/2023, 1:18 PM  Clinical Narrative: CSW confirmed with wife, Noeh Sparacino, that patient was at [facility] for rehab and the plan is for the patient to return when medically ready. CSW confirmed patient's return with Charlcie Cradle in admissions at [facility]. TOC to follow.       Expected Discharge Plan: Skilled Nursing Facility Barriers to Discharge: Continued Medical Work up  Patient Goals and CMS Choice Patient states their goals for this hospitalization and ongoing recovery are:: Return to [facility] for rehab [email or web address] Compare Post Acute Care list provided to:: Patient Represenative (must comment) ([name] Mcinturff (spouse)) Choice offered to / list presented to : Spouse  Expected Discharge Plan and Services In-house Referral: Clinical Social Work Post Acute Care Choice: Skilled Nursing Facility Living arrangements for the past 2 months: Single Family Home          DME Arranged: N/A DME Agency: NA  Prior Living Arrangements/Services Living arrangements for the past 2 months: Single Family Home Lives with:: Spouse Patient language and need for interpreter reviewed:: Yes Do you feel safe going back to the place where you live?: Yes      Need for Family Participation in Patient Care: Yes (Comment) Care giver support system in place?: Yes (comment) Criminal Activity/Legal Involvement Pertinent to Current Situation/Hospitalization: No - Comment as needed  Activities of Daily Living ADL Screening (condition at time of admission) Independently performs ADLs?: No Does the patient have a NEW difficulty with bathing/dressing/toileting/self-feeding that is expected to last >3 days?: No Does the patient have a NEW difficulty with getting in/out of bed, walking, or  climbing stairs that is expected to last >3 days?: No Does the patient have a NEW difficulty with communication that is expected to last >3 days?: No Is the patient deaf or have difficulty hearing?: Yes Does the patient have difficulty seeing, even when wearing glasses/contacts?: Yes Does the patient have difficulty concentrating, remembering, or making decisions?: Yes  Permission Sought/Granted Permission sought to share information with : Oceanographer granted to share information with : Yes, Verbal Permission Granted Permission granted to share info w AGENCY: [facility]  Emotional Assessment Attitude/Demeanor/Rapport: Unable to Assess Affect (typically observed): Unable to Assess Alcohol / Substance Use: Not Applicable Psych Involvement: No (comment)  Admission diagnosis:  Dehydration [E86.0] Hypernatremia [E87.0] AKI (acute kidney injury) (HCC) [N17.9] Hypothermia, initial encounter [T68.XXXA] Altered mental status, unspecified altered mental status type [R41.82] Patient Active Problem List   Diagnosis Date Noted   Hypernatremia 11/07/2023   Femur fracture (HCC) 10/24/2023   Protein-calorie malnutrition, severe 09/29/2023   Closed right hip fracture, initial encounter (HCC) 09/25/2023   Cervical spondylosis 09/25/2023   Paraparesis (HCC) 09/25/2023   Macrocytic anemia 09/25/2023   Hypocalcemia 09/25/2023   Altered mental status 07/21/2023   CVA (cerebral vascular accident) (HCC) 07/18/2023   Shuffling gait 07/03/2021   Episodes of formed visual hallucinations 07/03/2021   Nocturnal enuresis 07/03/2021   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  [place] [address] Phone: [phone] Fax: [phone]  Social Drivers of Health (SDOH) Social History: SDOH Screenings   Food Insecurity: No Food Insecurity (11/07/2023)  Housing: Unknown (11/07/2023)  Transportation Needs: No Transportation Needs (11/07/2023)  Utilities: Not At Risk (11/07/2023)  Financial Resource Strain: Low Risk  (10/20/2022)   Received from [facility], [facility]  Physical Activity: Unknown (10/20/2022)   Received from [facility], [facility]  Social Connections: Unknown (11/07/2023)  Recent Concern: Social Connections - Moderately Isolated (10/24/2023)  Stress: Patient Declined (10/20/2022)   Received from [facility], [facility]  Tobacco Use: High Risk (11/07/2023)   SDOH Interventions:    Readmission Risk Interventions    11/08/2023    1:14 PM  Readmission Risk Prevention Plan  Transportation Screening Complete  Medication Review (RN Care Manager) Complete  PCP or Specialist appointment within 3-5 days of discharge Not Complete  PCP/Specialist Appt Not Complete comments Patient will return to rehab at discharge.  HRI or Home Care Consult Complete  SW Recovery Care/Counseling Consult Complete  Palliative Care Screening Not Applicable  Skilled Nursing Facility Complete

## 2023-11-08 NOTE — Progress Notes (Signed)
   11/08/23 0815  Assess: MEWS Score  Temp 98 F (36.7 C)  BP (!) 106/50  ECG Heart Rate 71  Resp 18  Level of Consciousness Responds to Pain  SpO2 97 %  O2 Device Room Air  Assess: MEWS Score  MEWS Temp 0  MEWS Systolic 0  MEWS Pulse 0  MEWS RR 0  MEWS LOC 2  MEWS Score 2  MEWS Score Color Yellow  Assess: if the MEWS score is Yellow or Red  Were vital signs accurate and taken at a resting state? Yes  Does the patient meet 2 or more of the SIRS criteria? Yes  Does the patient have a confirmed or suspected source of infection? Yes  MEWS guidelines implemented  No, previously red, continue vital signs every 4 hours  Assess: SIRS CRITERIA  SIRS Temperature  0  SIRS Respirations  0  SIRS Pulse 0  SIRS WBC 0  SIRS Score Sum  0

## 2023-11-08 NOTE — Evaluation (Signed)
 Clinical/Bedside Swallow Evaluation Patient Details  Name: Ernest Campbell MRN: [id number] Date of Birth: Sep 05, 1950  Today's Date: 11/08/2023 Time: SLP Start Time (ACUTE ONLY): 1430 SLP Stop Time (ACUTE ONLY): 1443 SLP Time Calculation (min) (ACUTE ONLY): 13 min  Past Medical History:  Past Medical History:  Diagnosis Date   Anemia    Dementia (HCC)    Stroke [facility])    Past Surgical History:  Past Surgical History:  Procedure Laterality Date   INTRAMEDULLARY (IM) NAIL INTERTROCHANTERIC Right 09/26/2023   Procedure: INTRAMEDULLARY (IM) NAIL INTERTROCHANTERIC;  Surgeon: Roby Lofts, MD;  Location: [facility] OR;  Service: Orthopedics;  Laterality: Right;   ORIF FEMUR FRACTURE Right 10/24/2023   Procedure: OPEN REDUCTION INTERNAL FIXATION FEMORAL SHAFT FRACTURE;  Surgeon: Roby Lofts, MD;  Location: [facility] OR;  Service: Orthopedics;  Laterality: Right;   HPI:  73 yo presenting with severe dehydration, AKI, and suspicion for UTI. Also found to have oral thrush. PMH includes: CVA, TBI, cognitive deficits, macrocytic anemia, frequent falls, femur fracture. Prior to December he was living at home with his wife, but since then he has had multiple hospital admissions and SNF stays. Swallow assessment in December was functional, but with concern for adequate intake in light of mentation.    Assessment / Plan / Recommendation  Clinical Impression  Pt awakens to stimulation but is generally lethargic and not very attentive to PO trials. With cueing from SLP, the most interactive he became with presentation of purees, ice, or water, was to approximate his lips. He did not close them and he did not take anything into his oral cavity. Given mentation and positioning with head tilted posteriorly despite repositioning from SLP, this would lead to high risk for aspiration. It sounds like pt was swallowing well prior to acute decline, so hopeful that he would show improvements in swallowing if overall mentation  were to improve. Even if he can return to a PO diet, note that there were concerns for adequate amount  PO intake for nutrition in the past. Note that clear liquid diet was ordered by MD - would at least hold any POs until pt is more alert and accepting of them.  SLP Visit Diagnosis: Dysphagia, unspecified (R13.10)    Aspiration Risk  Risk for inadequate nutrition/hydration;Severe aspiration risk    Diet Recommendation NPO  (would at least hold clear liquids until mentation improves)  Medication Administration: Via alternative means    Other  Recommendations Oral Care Recommendations: Oral care QID Caregiver Recommendations: Have oral suction available    Recommendations for follow up therapy are one component of a multi-disciplinary discharge planning process, led by the attending physician.  Recommendations may be updated based on patient status, additional functional criteria and insurance authorization.  Follow up Recommendations Skilled nursing-short term rehab (<3 hours/day)      Assistance Recommended at Discharge    Functional Status Assessment Patient has had a recent decline in their functional status and demonstrates the ability to make significant improvements in function in a reasonable and predictable amount of time.  Frequency and Duration min 2x/week  2 weeks       Prognosis Prognosis for improved oropharyngeal function: Fair Barriers to Reach Goals: Cognitive deficits      Swallow Study   General HPI: 73 yo presenting with severe dehydration, AKI, and suspicion for UTI. Also found to have oral thrush. PMH includes: CVA, TBI, cognitive deficits, macrocytic anemia, frequent falls, femur fracture. Prior to December he was living at home  with his wife, but since then he has had multiple hospital admissions and SNF stays. Swallow assessment in December was functional, but with concern for adequate intake in light of mentation. Type of Study: Bedside Swallow  Evaluation Previous Swallow Assessment: see HPI Diet Prior to this Study: Clear liquid diet;Thin liquids (Level 0) Temperature Spikes Noted: No Respiratory Status: Room air History of Recent Intubation: No Behavior/Cognition: Lethargic/Drowsy Oral Cavity Assessment: Within Functional Limits Oral Care Completed by SLP: Yes Oral Cavity - Dentition: Edentulous Self-Feeding Abilities: Total assist Patient Positioning: Upright in bed Baseline Vocal Quality: Not observed Volitional Cough: Cognitively unable to elicit Volitional Swallow: Unable to elicit    Oral/Motor/Sensory Function Overall Oral Motor/Sensory Function: Other (comment) (not following commands for assessment)   Ice Chips Ice chips: Impaired Presentation: Spoon Oral Phase Impairments: Poor awareness of bolus Pharyngeal Phase Impairments: Unable to trigger swallow   Thin Liquid Thin Liquid: Impaired Presentation: Spoon;Straw (swab) Oral Phase Impairments: Poor awareness of bolus Pharyngeal  Phase Impairments: Unable to trigger swallow    Nectar Thick Nectar Thick Liquid: Not tested   Honey Thick Honey Thick Liquid: Not tested   Puree Puree: Impaired Presentation: Spoon Oral Phase Impairments: Poor awareness of bolus Pharyngeal Phase Impairments: Unable to trigger swallow   Solid     Solid: Not tested      Mahala Menghini., M.A. CCC-SLP Acute Rehabilitation Services Office [phone]  Secure chat preferred  11/08/2023,2:55 PM

## 2023-11-08 NOTE — Progress Notes (Signed)
 Orders placed for Cortrak placement, this RN at bedside to place Cortrak. Per the wife she was not notified about Cortrak placement and is refusing at this time. Please reach out to [facility] for further assistance.   Burnadette Pop, MD notified

## 2023-11-08 NOTE — Progress Notes (Signed)
 Initial Nutrition Assessment  DOCUMENTATION CODES:   Severe malnutrition in context of chronic illness  INTERVENTION:   -Ensure Plus High Protein po TID, each supplement provides 350 kcal and 20 grams of protein.   -Will monitor for SLP recommendations -If patient is deemed unsafe for POs, recommend placement of Cortrak tube for initiation of enteral feeds.  TF recommendations if Cortrak warranted: -Osmolite 1.5 @ 20 ml/hr, advance by 10 ml every 12 hours to goal rate of 50 ml/hr. 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  GOAL:   Patient will meet greater than or equal to 90% of their needs  MONITOR:   Diet advancement  REASON FOR ASSESSMENT:   Consult Assessment of nutrition requirement/status  ASSESSMENT:   73 year old male with history of CVA, traumatic brain injury, cognitive deficits, macrocytic anemia, frequent falls, femur fracture who presented from home for further evaluation of severe dehydration, AKI, suspicion for UTI.  Previously, he was coherent, had good appetite but recently started having frequent falls, worsening delirium and has been admitted multiple times at rehab.  Report of losing weight, not interactive, not eating or drinking.  Started on antibiotic for suspected UTI about 3 to 4 days ago.  On presentation, he was hypothermic,  hypotensive.  Lab work showed severe AKI with creatinine in the range of 4, hyponatremia.  Found to be severely dehydrated.  Patient in room, pt not able to provide any history, not responding. Pt seen by palliative and continues to be full code at this time. SLP to evaluate today and will monitor for their recommendations. Pt currently only on clear liquids. Pt not likely to meet nutritional needs on clear liquids and if deemed unsafe for POs, likely needs Cortrak tube. Will leave tube feeding recommendations above.   Per chart review, pt's wife reports pt has been declining over the last 3 months. Has had decreased appetite where he has not been eating or drinking and pt has been less interactive.   Pt with unstageable wound, if diet advanced or tube placed, recommend Juven supplements, zinc and daily MVI.  Ensure has been ordered.  Per weight records, pt has lost 18 lbs since 3/20 (13% wt loss x 2 weeks, significant for time frame).  Medications: D5 infusion, Colace  Labs reviewed: CBGs: 77-92 Elevated Na   NUTRITION - FOCUSED PHYSICAL EXAM:  Flowsheet Row Most Recent Value  Orbital Region Moderate depletion  Upper Arm Region Moderate depletion  Thoracic and Lumbar Region Severe depletion  Buccal Region Moderate depletion  Temple Region Severe depletion  Clavicle Bone Region Severe depletion  Clavicle and Acromion Bone Region Severe depletion  Scapular Bone Region Moderate depletion  Dorsal Hand Severe depletion  Patellar Region Severe depletion  Anterior Thigh Region Severe depletion  Posterior Calf Region Severe depletion  Edema (RD Assessment) Mild  Hair Reviewed  Eyes Reviewed  Mouth Reviewed  Skin Reviewed  Nails Reviewed       Diet Order:   Diet Order             Diet clear liquid Room service appropriate? Yes; Fluid consistency: Thin  Diet effective now                   EDUCATION NEEDS:   Not appropriate for education at this time  Skin:  Skin  Assessment: Skin Integrity Issues: Skin Integrity Issues:: Unstageable, DTI DTI: right buttocks, right heel, left heel Unstageable: sacrum  Last BM:  4/3 -type 6  Height:   Ht Readings from Last 1 Encounters:  11/07/23 5\' 5"  (1.651 m)    Weight:   Wt Readings from Last 1 Encounters:  11/07/23 50.8 kg    BMI:  Body mass index is 18.64 kg/m.  Estimated Nutritional Needs:   Kcal:  1600-1800  Protein:  75-100g  Fluid:  1.8L/day   Tilda Franco, MS, RD, LDN Inpatient Clinical Dietitian Contact via Secure chat

## 2023-11-08 NOTE — Progress Notes (Signed)
 PROGRESS NOTE  HOUA ACKERT  JWJ:[id number] DOB: 1950-12-19 DOA: 11/07/2023 PCP: Margot Ables, MD (Inactive)   Brief Narrative:  Patient is a 73 year old male with history of CVA, traumatic brain injury, cognitive deficits, macrocytic anemia, frequent falls, femur fracture who presented from home for further evaluation of severe dehydration, AKI, suspicion for UTI.  Previously, he was coherent, had good appetite but recently started having frequent falls, worsening delirium and has been admitted multiple times at rehab.  Report of losing weight, not interactive, not eating or drinking.  Started on antibiotic for suspected UTI about 3 to 4 days ago.  On presentation, he was hypothermic, hypotensive.  Lab work showed severe AKI with creatinine in the range of 4, hyponatremia.  Found to be severely dehydrated.  Started on antibiotic, IV fluid.  Nutritionist consulted.  Palliative care care consulted  for goals of care  Assessment & Plan:  Principal Problem:   Hypernatremia   Severe dehydration/hypernatremia/AKI: Likely from poor oral intake for last several days.  Patient has been more confused at home, declining, losing weight. Continue IV fluid.  Will change the fluid to D5.  Dietitian consulted.Speech therapy also consulted  Hypotension: Likely from severe dehydration.  Continue IV fluid.  Current blood pressure stable  Suspected sepsis/UTI: Follow-up cultures.  Currently on Zosyn.  Not sure if patient had dysuria  Oral thrush: Started on fluconazole  AKI: Likely from combination of severe dehydration, UTI, hypotension.  Found to have concentrated urine.  Kidney function improving with IV fluid.  Renal ultrasound did not show any obstruction  Severe failure to thrive: Nutritionist consulted..Palliative care consulted for goals of care.  Patient has recurrent admission, multiple falls.  Very poor quality of life.  History of CVA, traumatic brain injury, brain hemorrhage  on  2022,cognitive deficits.Significantly declined after Dec 2024.He was eating well until few days ago  Chronic normocytic anemia: Continue to monitor hemoglobin.  No evidence of acute blood loss.  Could be associated with malnutrition as well  Pressure Injury 11/07/23 Sacrum Mid Unstageable - Full thickness tissue loss in which the base of the injury is covered by slough (yellow, tan, gray, green or brown) and/or eschar (tan, brown or black) in the wound bed. large area with yellow-ish base, s (Active)  11/07/23 1845  Location: Sacrum  Location Orientation: Mid  Staging: Unstageable - Full thickness tissue loss in which the base of the injury is covered by slough (yellow, tan, gray, green or brown) and/or eschar (tan, brown or black) in the wound bed.  Wound Description (Comments): large area with yellow-ish base, slight drainage  Present on Admission: Yes     Pressure Injury 11/07/23 Buttocks Right Deep Tissue Pressure Injury - Purple or maroon localized area of discolored intact skin or blood-filled blister due to damage of underlying soft tissue from pressure and/or shear. (Active)  11/07/23 1845  Location: Buttocks  Location Orientation: Right  Staging: Deep Tissue Pressure Injury - Purple or maroon localized area of discolored intact skin or blood-filled blister due to damage of underlying soft tissue from pressure and/or shear.  Wound Description (Comments):   Present on Admission: Yes     Pressure Injury 11/07/23 Heel Right Deep Tissue Pressure Injury - Purple or maroon localized area of discolored intact skin or blood-filled blister due to damage of underlying soft tissue from pressure and/or shear. (Active)  11/07/23 1845  Location: Heel  Location Orientation: Right  Staging: Deep Tissue Pressure Injury - Purple or maroon localized area of discolored intact  skin or blood-filled blister due to damage of underlying soft tissue from pressure and/or shear.  Wound Description (Comments):    Present on Admission: Yes     Pressure Injury 11/07/23 Heel Left Deep Tissue Pressure Injury - Purple or maroon localized area of discolored intact skin or blood-filled blister due to damage of underlying soft tissue from pressure and/or shear. (Active)  11/07/23 1905  Location: Heel  Location Orientation: Left  Staging: Deep Tissue Pressure Injury - Purple or maroon localized area of discolored intact skin or blood-filled blister due to damage of underlying soft tissue from pressure and/or shear.  Wound Description (Comments):   Present on Admission: Yes         Pressure Injury 11/07/23 Sacrum Mid Unstageable - Full thickness tissue loss in which the base of the injury is covered by slough (yellow, tan, gray, green or brown) and/or eschar (tan, brown or black) in the wound bed. large area with yellow-ish base, s (Active)  11/07/23 1845  Location: Sacrum  Location Orientation: Mid  Staging: Unstageable - Full thickness tissue loss in which the base of the injury is covered by slough (yellow, tan, gray, green or brown) and/or eschar (tan, brown or black) in the wound bed.  Wound Description (Comments): large area with yellow-ish base, slight drainage  Present on Admission: Yes  Dressing Type Foam - Lift dressing to assess site every shift 11/07/23 2057     Pressure Injury 11/07/23 Buttocks Right Deep Tissue Pressure Injury - Purple or maroon localized area of discolored intact skin or blood-filled blister due to damage of underlying soft tissue from pressure and/or shear. (Active)  11/07/23 1845  Location: Buttocks  Location Orientation: Right  Staging: Deep Tissue Pressure Injury - Purple or maroon localized area of discolored intact skin or blood-filled blister due to damage of underlying soft tissue from pressure and/or shear.  Wound Description (Comments):   Present on Admission: Yes  Dressing Type Foam - Lift dressing to assess site every shift 11/07/23 2057     Pressure Injury  11/07/23 Heel Right Deep Tissue Pressure Injury - Purple or maroon localized area of discolored intact skin or blood-filled blister due to damage of underlying soft tissue from pressure and/or shear. (Active)  11/07/23 1845  Location: Heel  Location Orientation: Right  Staging: Deep Tissue Pressure Injury - Purple or maroon localized area of discolored intact skin or blood-filled blister due to damage of underlying soft tissue from pressure and/or shear.  Wound Description (Comments):   Present on Admission: Yes  Dressing Type Foam - Lift dressing to assess site every shift 11/07/23 2057     Pressure Injury 11/07/23 Heel Left Deep Tissue Pressure Injury - Purple or maroon localized area of discolored intact skin or blood-filled blister due to damage of underlying soft tissue from pressure and/or shear. (Active)  11/07/23 1905  Location: Heel  Location Orientation: Left  Staging: Deep Tissue Pressure Injury - Purple or maroon localized area of discolored intact skin or blood-filled blister due to damage of underlying soft tissue from pressure and/or shear.  Wound Description (Comments):   Present on Admission: Yes  Dressing Type Foam - Lift dressing to assess site every shift 11/07/23 2057    DVT prophylaxis:heparin injection 5,000 Units Start: 11/07/23 2200     Code Status: Full Code  Family Communication: called and discussed with wife on phone on 4/4  Patient status:Inpatient  Patient is from :Home  Anticipated discharge ZO:[facility] vs SNF  Estimated DC date:not sure,may take  2-3 days   Consultants: palliative care  Procedures:None  Antimicrobials:  Anti-infectives (From admission, onward)    Start     Dose/Rate Route Frequency Ordered Stop   11/08/23 0600  piperacillin-tazobactam (ZOSYN) IVPB 3.375 g  Status:  Discontinued        3.375 g 12.5 mL/hr over 240 Minutes Intravenous Every 12 hours 11/07/23 1757 11/08/23 0247   11/08/23 0400  piperacillin-tazobactam (ZOSYN) IVPB  3.375 g        3.375 g 12.5 mL/hr over 240 Minutes Intravenous Every 8 hours 11/08/23 0247     11/07/23 2000  fluconazole (DIFLUCAN) tablet 100 mg        100 mg Oral Daily 11/07/23 1729     11/07/23 1700  piperacillin-tazobactam (ZOSYN) IVPB 3.375 g        3.375 g 100 mL/hr over 30 Minutes Intravenous  Once 11/07/23 1648 11/07/23 2045   11/07/23 1200  cefTRIAXone (ROCEPHIN) 2 g in sodium chloride 0.9 % 100 mL IVPB        2 g 200 mL/hr over 30 Minutes Intravenous Once 11/07/23 1147 11/07/23 1234       Subjective: Patient seen and examined at bedside today.  He was lying in bed.  He was alert and awake but nonverbal.  Did not look in any kind of distress.  Very deconditioned, malnourished.  Overall appears comfortable  Objective: Vitals:   11/08/23 0147 11/08/23 0300 11/08/23 0425 11/08/23 0544  BP: (!) 100/48 (!) 105/45 (!) 112/46 (!) 113/47  Pulse:      Resp: 16 14 17 18   Temp: 98.4 F (36.9 C)   97.9 F (36.6 C)  TempSrc: Axillary   Axillary  SpO2: 100% 100% 100% 100%  Weight:      Height:        Intake/Output Summary (Last 24 hours) at 11/08/2023 0817 Last data filed at 11/08/2023 0422 Gross per 24 hour  Intake 5519.36 ml  Output 1850 ml  Net 3669.36 ml   Filed Weights   11/07/23 1853  Weight: 50.8 kg    Examination:  General exam: Overall comfortable, not in distress, very deconditioned, malnourished HEENT: PERRL Respiratory system:  no wheezes or crackles  Cardiovascular system: S1 & S2 heard, RRR.  Gastrointestinal system: Abdomen is nondistended, soft and nontender. Central nervous system: Alert and awake but not oriented Extremities: No edema, no clubbing ,no cyanosis Skin: pressure ulcers     Data Reviewed: I have personally reviewed following labs and imaging studies  CBC: Recent Labs  Lab 11/07/23 1215 11/08/23 0513  WBC 7.0 7.5  NEUTROABS 4.9  --   HGB 10.6* 7.6*  HCT 34.6* 26.6*  MCV 105.2* 112.2*  PLT 514* 343   Basic Metabolic  Panel: Recent Labs  Lab 11/07/23 1215 11/07/23 1952 11/07/23 2341 11/08/23 0513  NA 152* 155* 147* 152*  K 4.2 4.1 3.6 4.3  CL 120* 126* 121* 126*  CO2 21* 20* 20* 20*  GLUCOSE 121* 101* 87 85  BUN 141* 122* 93* 84*  CREATININE 4.17* 2.85* 2.15* 1.88*  CALCIUM 8.3* 7.9* 7.2* 8.1*     Recent Results (from the past 240 hours)  Resp panel by RT-PCR (RSV, Flu A&B, Covid) Anterior Nasal Swab     Status: None   Collection Time: 11/07/23 11:54 AM   Specimen: Anterior Nasal Swab  Result Value Ref Range Status   SARS Coronavirus 2 by RT PCR NEGATIVE NEGATIVE Final    Comment: (NOTE) SARS-CoV-2 target nucleic acids are NOT DETECTED.  The SARS-CoV-2 RNA is generally detectable in upper respiratory specimens during the acute phase of infection. The lowest concentration of SARS-CoV-2 viral copies this assay can detect is 138 copies/mL. A negative result does not preclude SARS-Cov-2 infection and should not be used as the sole basis for treatment or other patient management decisions. A negative result may occur with  improper specimen collection/handling, submission of specimen other than nasopharyngeal swab, presence of viral mutation(s) within the areas targeted by this assay, and inadequate number of viral copies(<138 copies/mL). A negative result must be combined with clinical observations, patient history, and epidemiological information. The expected result is Negative.  Fact Sheet for Patients:  BloggerCourse.com  Fact Sheet for Healthcare Providers:  SeriousBroker.it  This test is no t yet approved or cleared by the [place] FDA and  has been authorized for detection and/or diagnosis of SARS-CoV-2 by FDA under an Emergency Use Authorization (EUA). This EUA will remain  in effect (meaning this test can be used) for the duration of the COVID-19 declaration under Section 564(b)(1) of the Act, 21 U.S.C.section  360bbb-3(b)(1), unless the authorization is terminated  or revoked sooner.       Influenza A by PCR NEGATIVE NEGATIVE Final   Influenza B by PCR NEGATIVE NEGATIVE Final    Comment: (NOTE) The Xpert Xpress SARS-CoV-2/FLU/RSV plus assay is intended as an aid in the diagnosis of influenza from Nasopharyngeal swab specimens and should not be used as a sole basis for treatment. Nasal washings and aspirates are unacceptable for Xpert Xpress SARS-CoV-2/FLU/RSV testing.  Fact Sheet for Patients: BloggerCourse.com  Fact Sheet for Healthcare Providers: SeriousBroker.it  This test is not yet approved or cleared by the [place] FDA and has been authorized for detection and/or diagnosis of SARS-CoV-2 by FDA under an Emergency Use Authorization (EUA). This EUA will remain in effect (meaning this test can be used) for the duration of the COVID-19 declaration under Section 564(b)(1) of the Act, 21 U.S.C. section 360bbb-3(b)(1), unless the authorization is terminated or revoked.     Resp Syncytial Virus by PCR NEGATIVE NEGATIVE Final    Comment: (NOTE) Fact Sheet for Patients: BloggerCourse.com  Fact Sheet for Healthcare Providers: SeriousBroker.it  This test is not yet approved or cleared by the [place] FDA and has been authorized for detection and/or diagnosis of SARS-CoV-2 by FDA under an Emergency Use Authorization (EUA). This EUA will remain in effect (meaning this test can be used) for the duration of the COVID-19 declaration under Section 564(b)(1) of the Act, 21 U.S.C. section 360bbb-3(b)(1), unless the authorization is terminated or revoked.  Performed at [facility], [address], [place], [place] [address]   Blood Culture (routine x 2)     Status: None (Preliminary result)   Collection Time: 11/07/23 12:15 PM   Specimen: BLOOD  Result Value  Ref Range Status   Specimen Description   Final    BLOOD LEFT ANTECUBITAL Performed at [facility], [address] [place], [place] [address]    Special Requests   Final    BOTTLES DRAWN AEROBIC AND ANAEROBIC Blood Culture adequate volume Performed at [facility], [address], [place], [place] [address]    Culture PENDING  Incomplete   Report Status PENDING  Incomplete  Blood Culture (routine x 2)     Status: None (Preliminary result)   Collection Time: 11/07/23 12:15 PM   Specimen: BLOOD RIGHT ARM  Result Value Ref Range Status   Specimen Description  Final    BLOOD RIGHT ARM Performed at [facility], [address] [place], [place] [address]    Special Requests   Final    BOTTLES DRAWN AEROBIC AND ANAEROBIC Blood Culture results may not be optimal due to an inadequate volume of blood received in culture bottles Performed at [facility], [address], [place], [place] [address]    Culture PENDING  Incomplete   Report Status PENDING  Incomplete  Urine Culture     Status: None (Preliminary result)   Collection Time: 11/07/23  1:35 PM   Specimen: Urine, Catheterized  Result Value Ref Range Status   Specimen Description   Final    URINE, CATHETERIZED Performed at [facility], [address] [place], [place] [address]    Special Requests   Final    NONE Reflexed from [id number] Performed at [facility], [address] [place], [place] [address]    Culture PENDING  Incomplete   Report Status PENDING  Incomplete     Radiology Studies: US RENAL Result Date: 11/07/2023 CLINICAL DATA:  Acute renal insufficiency EXAM: RENAL / URINARY TRACT ULTRASOUND COMPLETE COMPARISON:  None Available. FINDINGS: Right Kidney: Renal measurements: 8.3 x 4.8 x 3.3 cm = volume: 69.8 mL. Echogenicity within normal limits. No mass or hydronephrosis visualized. Left Kidney: Renal measurements: 7.9 x 4.8 x 3.9 cm =  volume: 76.7 mL. Echogenicity within normal limits. No mass or hydronephrosis visualized. Bladder: Bladder is decompressed with a Foley catheter. Other: None. IMPRESSION: 1. Unremarkable renal ultrasound. Electronically Signed   By: Sharlet Salina M.D.   On: 11/07/2023 17:36   CT Head Wo Contrast Result Date: 11/07/2023 CLINICAL DATA:  Mental status change EXAM: CT HEAD WITHOUT CONTRAST TECHNIQUE: Contiguous axial images were obtained from the base of the skull through the vertex without intravenous contrast. RADIATION DOSE REDUCTION: This exam was performed according to the departmental dose-optimization program which includes automated exposure control, adjustment of the mA and/or kV according to patient size and/or use of iterative reconstruction technique. COMPARISON:  Head CT 10/24/2023 FINDINGS: Brain: No evidence of acute infarction, hemorrhage, hydrocephalus, extra-axial collection or mass lesion/mass effect. There is mild periventricular white matter hypodensity, likely chronic small vessel ischemic change. Vascular: No hyperdense vessel or unexpected calcification. Skull: Normal. Negative for fracture or focal lesion. Sinuses/Orbits: There is stable complete opacification of the right maxillary sinus with some central hyperdense material. Mastoid air cells are clear. Orbits are within normal limits. Other: None. IMPRESSION: 1. No acute intracranial process. 2. Mild chronic small vessel ischemic change. 3. Stable complete opacification of the right maxillary sinus with some central hyperdense material, possibly inspissated secretions or fungal colonization. Electronically Signed   By: Darliss Cheney M.D.   On: 11/07/2023 15:29   DG Chest Port 1 View Result Date: 11/07/2023 CLINICAL DATA:  Sepsis. EXAM: PORTABLE CHEST 1 VIEW COMPARISON:  October 06, 2023. FINDINGS: The heart size and mediastinal contours are within normal limits. Both lungs are clear. The visualized skeletal structures are unremarkable.  IMPRESSION: No active disease. Electronically Signed   By: Lupita Raider M.D.   On: 11/07/2023 13:40    Scheduled Meds:  aspirin EC  325 mg Oral Daily   Carbidopa-Levodopa ER  1 tablet Oral TID   docusate sodium  100 mg Oral QPM   feeding supplement  237 mL Oral TID BM   fluconazole  100 mg Oral Daily   fluticasone  1 spray Each Nare  Daily   heparin  5,000 Units Subcutaneous Q8H   leptospermum manuka honey  1 Application Topical Daily   liver oil-zinc oxide   Topical BID   memantine  5 mg Oral BID   QUEtiapine  25 mg Oral QHS   Continuous Infusions:  sodium chloride 100 mL/hr at 11/07/23 2217   piperacillin-tazobactam (ZOSYN)  IV 3.375 g (11/08/23 0324)     LOS: 1 day   Burnadette Pop, MD [facility] P4/11/2023, 8:17 AM

## 2023-11-08 NOTE — Consult Note (Signed)
 WOC Nurse Consult Note: Reason for Consult: sacral wound  Wound type: 1. Deep tissue Pressure Injury sacrum that has evolved to Unstageable  2.  DTPI buttocks  3.  B heels with Deep tissue Pressure Injuries  Pressure Injury POA: Yes Measurement: see nursing flowsheet  Wound bed: 1.  Sacrum 100% brown yellow necrotic tissue  2.  Purple maroon discoloration noted to buttocks  2  B heels with purple maroon discoloration  Drainage (amount, consistency, odor) per nursing flowsheet  Periwound: sacrum with surrounding erythema and purple maroon discoloration indicating this wound started as a DTPI; heels with erythema and peeling epithelium  Dressing procedure/placement/frequency:  Cleanse sacral wound with Vashe wound cleanser Hart Rochester [phone]), do not rinse and allow to air dry.  Apply Medihoney to wound bed daily, cover with dry gauze.  Coat surrounding skin with a thin layer of Desitin. Secure area with ABD pads and tape or silicone foam whichever is preferred.  Cleanse B heels with Vashe wound cleanser Hart Rochester [phone]), do not rinse and allow to air dry. Apply Xeroform gauze Hart Rochester [phone]) to wound beds daily, cover with dry gauze and Kerlix roll gauze. Place bilateral feet in Prevalon boots Hart Rochester [phone]) to offload pressure.    Will order Desitin 2 times a day to skin surrounding  sacral wound and onto buttocks covering the DTPI to buttocks.  Patient should remain on a low air loss mattress throughout hospitalization for moisture management and to offload pressure.   POC discussed with bedside nurse. WOC team will not follow. Re-consult if further needs arise.   Thank you,    Priscella Mann MSN, RN-BC, [employer] [phone]

## 2023-11-09 DIAGNOSIS — E870 Hyperosmolality and hypernatremia: Secondary | ICD-10-CM | POA: Diagnosis not present

## 2023-11-09 LAB — CBC
HCT: 30.6 % — ABNORMAL LOW (ref 39.0–52.0)
Hemoglobin: 9.1 g/dL — ABNORMAL LOW (ref 13.0–17.0)
MCH: 32.2 pg (ref 26.0–34.0)
MCHC: 29.7 g/dL — ABNORMAL LOW (ref 30.0–36.0)
MCV: 108.1 fL — ABNORMAL HIGH (ref 80.0–100.0)
Platelets: 362 10*3/uL (ref 150–400)
RBC: 2.83 MIL/uL — ABNORMAL LOW (ref 4.22–5.81)
RDW: 13.9 % (ref 11.5–15.5)
WBC: 7.9 10*3/uL (ref 4.0–10.5)
nRBC: 0 % (ref 0.0–0.2)

## 2023-11-09 LAB — BASIC METABOLIC PANEL WITH GFR
Anion gap: 5 (ref 5–15)
BUN: 50 mg/dL — ABNORMAL HIGH (ref 8–23)
CO2: 21 mmol/L — ABNORMAL LOW (ref 22–32)
Calcium: 8.1 mg/dL — ABNORMAL LOW (ref 8.9–10.3)
Chloride: 120 mmol/L — ABNORMAL HIGH (ref 98–111)
Creatinine, Ser: 1.17 mg/dL (ref 0.61–1.24)
GFR, Estimated: >60 mL/min (ref >60)
Glucose, Bld: 117 mg/dL — ABNORMAL HIGH (ref 70–99)
Potassium: 3.9 mmol/L (ref 3.5–5.1)
Sodium: 146 mmol/L — ABNORMAL HIGH (ref 135–145)

## 2023-11-09 MED ORDER — BOOST / RESOURCE BREEZE PO LIQD CUSTOM
1.0000 | Freq: Three times a day (TID) | ORAL | Status: DC
  Administered 2023-11-09 – 2023-11-12 (×9): 1 via ORAL

## 2023-11-09 NOTE — Progress Notes (Signed)
 Speech Language Pathology Treatment: Dysphagia  Patient Details Name: Ernest Campbell MRN: [id number] DOB: 06-Oct-1950 Today's Date: 11/09/2023 Time: 1040-1105 SLP Time Calculation (min) (ACUTE ONLY): 25 min  Assessment / Plan / Recommendation Clinical Impression  Patient seen by SLP for skilled treatment focused on dysphagia goals. His spouse was present in room and she provided history.  Patient was sitting up in recliner with eyes open. Spouse told SLP that she had just fed him some chocolate pudding, ice cream and liquids. SLP assessed patient's toleration of PO's of thin liquids: coffee, juice and puree solids (with meds crushed and some whole). Patient with delayed initiation of drawing liquid through straw but was able to successfully perform (unable to do that with SLP previous date). Initial sip was coffee and he did exhibit a cough response after sip of coffee, however no further overt s/s aspiration with any subsequent sips of liquids. Swallow initiation appeared fairly timely. He did exhibit delayed oral transit with meds crushed in puree, and at one point he spit out small pill (that couldn't be crushed). With cueing from spouse and RN, patient able to fully clear meds and PO's from oral cavity. SLP recommending continue full liquids but if MD wishes to advance, would not recommend higher than Dys 1 (puree) at this time. SLP will continue to follow.   HPI HPI: 74 yo presenting with severe dehydration, AKI, and suspicion for UTI. Also found to have oral thrush. PMH includes: CVA, TBI, cognitive deficits, macrocytic anemia, frequent falls, femur fracture. Prior to December he was living at home with his wife, but since then he has had multiple hospital admissions and SNF stays. Swallow assessment in December was functional, but with concern for adequate intake in light of mentation.      SLP Plan  Continue with current plan of care      Recommendations for follow up therapy are one  component of a multi-disciplinary discharge planning process, led by the attending physician.  Recommendations may be updated based on patient status, additional functional criteria and insurance authorization.    Recommendations  Diet recommendations: Thin liquid;Other(comment);Dysphagia 1 (puree) (continue fulls versus advance no higher than purees) Medication Administration: Crushed with puree Supervision: Full supervision/cueing for compensatory strategies;Staff to assist with self feeding;Trained caregiver to feed patient Compensations: Slow rate;Small sips/bites;Minimize environmental distractions Postural Changes and/or Swallow Maneuvers: Seated upright 90 degrees                  Oral care BID;Staff/trained caregiver to provide oral care;Oral care before and after PO   Frequent or constant Supervision/Assistance Dysphagia, unspecified (R13.10)     Continue with current plan of care     Angela Nevin, MA, CCC-SLP Speech Therapy

## 2023-11-09 NOTE — Progress Notes (Signed)
 PROGRESS NOTE  NICKOLAS CHALFIN  WUJ:[id number] DOB: Sep 24, 1950 DOA: 11/07/2023 PCP: Margot Ables, MD (Inactive)   Brief Narrative:  Patient is a 73 year old male with history of CVA, traumatic brain injury, cognitive deficits, macrocytic anemia, frequent falls, femur fracture who presented from home for further evaluation of severe dehydration, AKI, suspicion for UTI.  Previously, he was coherent, had good appetite but recently started having frequent falls, worsening delirium and has been admitted multiple times at rehab.  Report of losing weight, not interactive, not eating or drinking.  Started on antibiotic for suspected UTI about 3 to 4 days ago.  On presentation, he was hypothermic, hypotensive.  Lab work showed severe AKI with creatinine in the range of 4, hyponatremia.  Found to be severely dehydrated.  Started on antibiotic, IV fluid.  Nutritionist consulted.  Palliative care care consulted  for goals of care  Assessment & Plan:  Principal Problem:   Hypernatremia   Severe dehydration/hypernatremia/AKI/poor oral intake: Likely from poor oral intake for last several days.  Patient has been more confused at home, declining, losing weight. Continue gentle IV fluid. Now at D5.  Dietitian consulted.Speech therapy also consulted. .  Wife declined tube feeding.  Patient is now tolerating clear liquid.  If fed, he eats.  Started on dysphagia 1 diet. Hyponatremia improving  Hypotension: Likely from severe dehydration.   Currently blood pressure stable  Suspected sepsis/UTI: Follow-up cultures.  Currently on Zosyn.  Not sure if patient had dysuria.  Urine culture growing significant gram-negative rods, will follow final culture report  Oral thrush: Started on fluconazole  AKI: Likely from combination of severe dehydration, UTI, hypotension.  Found to have concentrated urine. Now resolved. Renal ultrasound did not show any obstruction  Severe failure to thrive: Nutritionist  consulted..Palliative care consulted for goals of care.  Patient has recurrent admission, multiple falls.  Very poor quality of life.  History of CVA, traumatic brain injury, brain hemorrhage  on 2022,cognitive deficits.Significantly declined after Dec 2024.He was eating well until few days ago.  Chronic normocytic anemia: Continue to monitor hemoglobin.  No evidence of acute blood loss.  Could be associated with malnutrition as well  Pressure Injury 11/07/23 Sacrum Mid Unstageable - Full thickness tissue loss in which the base of the injury is covered by slough (yellow, tan, gray, green or brown) and/or eschar (tan, brown or black) in the wound bed. large area with yellow-ish base, s (Active)  11/07/23 1845  Location: Sacrum  Location Orientation: Mid  Staging: Unstageable - Full thickness tissue loss in which the base of the injury is covered by slough (yellow, tan, gray, green or brown) and/or eschar (tan, brown or black) in the wound bed.  Wound Description (Comments): large area with yellow-ish base, slight drainage  Present on Admission: Yes     Pressure Injury 11/07/23 Buttocks Right Deep Tissue Pressure Injury - Purple or maroon localized area of discolored intact skin or blood-filled blister due to damage of underlying soft tissue from pressure and/or shear. (Active)  11/07/23 1845  Location: Buttocks  Location Orientation: Right  Staging: Deep Tissue Pressure Injury - Purple or maroon localized area of discolored intact skin or blood-filled blister due to damage of underlying soft tissue from pressure and/or shear.  Wound Description (Comments):   Present on Admission: Yes     Pressure Injury 11/07/23 Heel Right Deep Tissue Pressure Injury - Purple or maroon localized area of discolored intact skin or blood-filled blister due to damage of underlying soft tissue from pressure  and/or shear. (Active)  11/07/23 1845  Location: Heel  Location Orientation: Right  Staging: Deep Tissue  Pressure Injury - Purple or maroon localized area of discolored intact skin or blood-filled blister due to damage of underlying soft tissue from pressure and/or shear.  Wound Description (Comments):   Present on Admission: Yes     Pressure Injury 11/07/23 Heel Left Deep Tissue Pressure Injury - Purple or maroon localized area of discolored intact skin or blood-filled blister due to damage of underlying soft tissue from pressure and/or shear. (Active)  11/07/23 1905  Location: Heel  Location Orientation: Left  Staging: Deep Tissue Pressure Injury - Purple or maroon localized area of discolored intact skin or blood-filled blister due to damage of underlying soft tissue from pressure and/or shear.  Wound Description (Comments):   Present on Admission: Yes       Nutrition Problem: Severe Malnutrition Etiology: chronic illness (TBI, CVA) Pressure Injury 11/07/23 Sacrum Mid Unstageable - Full thickness tissue loss in which the base of the injury is covered by slough (yellow, tan, gray, green or brown) and/or eschar (tan, brown or black) in the wound bed. large area with yellow-ish base, s (Active)  11/07/23 1845  Location: Sacrum  Location Orientation: Mid  Staging: Unstageable - Full thickness tissue loss in which the base of the injury is covered by slough (yellow, tan, gray, green or brown) and/or eschar (tan, brown or black) in the wound bed.  Wound Description (Comments): large area with yellow-ish base, slight drainage  Present on Admission: Yes  Dressing Type Foam - Lift dressing to assess site every shift 11/09/23 0942     Pressure Injury 11/07/23 Buttocks Right Deep Tissue Pressure Injury - Purple or maroon localized area of discolored intact skin or blood-filled blister due to damage of underlying soft tissue from pressure and/or shear. (Active)  11/07/23 1845  Location: Buttocks  Location Orientation: Right  Staging: Deep Tissue Pressure Injury - Purple or maroon localized area of  discolored intact skin or blood-filled blister due to damage of underlying soft tissue from pressure and/or shear.  Wound Description (Comments):   Present on Admission: Yes  Dressing Type Foam - Lift dressing to assess site every shift 11/09/23 0942     Pressure Injury 11/07/23 Heel Right Deep Tissue Pressure Injury - Purple or maroon localized area of discolored intact skin or blood-filled blister due to damage of underlying soft tissue from pressure and/or shear. (Active)  11/07/23 1845  Location: Heel  Location Orientation: Right  Staging: Deep Tissue Pressure Injury - Purple or maroon localized area of discolored intact skin or blood-filled blister due to damage of underlying soft tissue from pressure and/or shear.  Wound Description (Comments):   Present on Admission: Yes  Dressing Type Gauze (Comment) 11/09/23 0942     Pressure Injury 11/07/23 Heel Left Deep Tissue Pressure Injury - Purple or maroon localized area of discolored intact skin or blood-filled blister due to damage of underlying soft tissue from pressure and/or shear. (Active)  11/07/23 1905  Location: Heel  Location Orientation: Left  Staging: Deep Tissue Pressure Injury - Purple or maroon localized area of discolored intact skin or blood-filled blister due to damage of underlying soft tissue from pressure and/or shear.  Wound Description (Comments):   Present on Admission: Yes  Dressing Type Gauze (Comment) 11/09/23 0942    DVT prophylaxis:heparin injection 5,000 Units Start: 11/07/23 2200     Code Status: Full Code  Family Communication: discussed with wife at bedside on 4/5  Patient status:Inpatient  Patient is from :Home  Anticipated discharge EX:[facility] vs SNF  Estimated DC date:not sure,may take 2-3 days   Consultants: palliative care  Procedures:None  Antimicrobials:  Anti-infectives (From admission, onward)    Start     Dose/Rate Route Frequency Ordered Stop   11/08/23 0600   piperacillin-tazobactam (ZOSYN) IVPB 3.375 g  Status:  Discontinued        3.375 g 12.5 mL/hr over 240 Minutes Intravenous Every 12 hours 11/07/23 1757 11/08/23 0247   11/08/23 0400  piperacillin-tazobactam (ZOSYN) IVPB 3.375 g        3.375 g 12.5 mL/hr over 240 Minutes Intravenous Every 8 hours 11/08/23 0247     11/07/23 2000  fluconazole (DIFLUCAN) tablet 100 mg        100 mg Oral Daily 11/07/23 1729     11/07/23 1700  piperacillin-tazobactam (ZOSYN) IVPB 3.375 g        3.375 g 100 mL/hr over 30 Minutes Intravenous  Once 11/07/23 1648 11/07/23 2045   11/07/23 1200  cefTRIAXone (ROCEPHIN) 2 g in sodium chloride 0.9 % 100 mL IVPB        2 g 200 mL/hr over 30 Minutes Intravenous Once 11/07/23 1147 11/07/23 1234       Subjective: Patient seen and examined at the bedside today.  Hemodynamically stable.  He was working with physical therapy.  He appears comfortable.  Not in apparent distress.  Remains significantly deconditioned and weak.  Wife  was able to feed him full liquid diet yesterday.  We discussed about starting on full liquid or dysphagia 1 diet today.  Foley will be removed.  Objective: Vitals:   11/08/23 2036 11/08/23 2037 11/09/23 0547 11/09/23 0857  BP: (!) 101/55  (!) 114/57 100/67  Pulse: (!) 50 64 66 60  Resp: 16  14 14   Temp: 98.6 F (37 C)  97.8 F (36.6 C) (!) 97.5 F (36.4 C)  TempSrc: Oral  Oral Oral  SpO2: 90% 99% 100%   Weight:      Height:        Intake/Output Summary (Last 24 hours) at 11/09/2023 1118 Last data filed at 11/09/2023 0600 Gross per 24 hour  Intake 1724.97 ml  Output 650 ml  Net 1074.97 ml   Filed Weights   11/07/23 1853  Weight: 50.8 kg    Examination:  General exam: Overall comfortable, not in distress,very deconditioned,malnourished HEENT: PERRL Respiratory system:  no wheezes or crackles  Cardiovascular system: S1 & S2 heard, RRR.  Gastrointestinal system: Abdomen is nondistended, soft and nontender. Central nervous system:  Alert and awake,mostly nonverbal Extremities: No edema, no clubbing ,no cyanosis Skin: pressure ulcers     Data Reviewed: I have personally reviewed following labs and imaging studies  CBC: Recent Labs  Lab 11/07/23 1215 11/08/23 0513 11/09/23 0536  WBC 7.0 7.5 7.9  NEUTROABS 4.9  --   --   HGB 10.6* 7.6* 9.1*  HCT 34.6* 26.6* 30.6*  MCV 105.2* 112.2* 108.1*  PLT 514* 343 362   Basic Metabolic Panel: Recent Labs  Lab 11/07/23 1952 11/07/23 2341 11/08/23 0513 11/08/23 0818 11/09/23 0536  NA 155* 147* 152* 153* 146*  K 4.1 3.6 4.3 3.9 3.9  CL 126* 121* 126* 125* 120*  CO2 20* 20* 20* 19* 21*  GLUCOSE 101* 87 85 87 117*  BUN 122* 93* 84* 79* 50*  CREATININE 2.85* 2.15* 1.88* 1.82* 1.17  CALCIUM 7.9* 7.2* 8.1* 8.1* 8.1*     Recent Results (from the  past 240 hours)  Resp panel by RT-PCR (RSV, Flu A&B, Covid) Anterior Nasal Swab     Status: None   Collection Time: 11/07/23 11:54 AM   Specimen: Anterior Nasal Swab  Result Value Ref Range Status   SARS Coronavirus 2 by RT PCR NEGATIVE NEGATIVE Final    Comment: (NOTE) SARS-CoV-2 target nucleic acids are NOT DETECTED.  The SARS-CoV-2 RNA is generally detectable in upper respiratory specimens during the acute phase of infection. The lowest concentration of SARS-CoV-2 viral copies this assay can detect is 138 copies/mL. A negative result does not preclude SARS-Cov-2 infection and should not be used as the sole basis for treatment or other patient management decisions. A negative result may occur with  improper specimen collection/handling, submission of specimen other than nasopharyngeal swab, presence of viral mutation(s) within the areas targeted by this assay, and inadequate number of viral copies(<138 copies/mL). A negative result must be combined with clinical observations, patient history, and epidemiological information. The expected result is Negative.  Fact Sheet for Patients:   BloggerCourse.com  Fact Sheet for Healthcare Providers:  SeriousBroker.it  This test is no t yet approved or cleared by the [place] FDA and  has been authorized for detection and/or diagnosis of SARS-CoV-2 by FDA under an Emergency Use Authorization (EUA). This EUA will remain  in effect (meaning this test can be used) for the duration of the COVID-19 declaration under Section 564(b)(1) of the Act, 21 U.S.C.section 360bbb-3(b)(1), unless the authorization is terminated  or revoked sooner.       Influenza A by PCR NEGATIVE NEGATIVE Final   Influenza B by PCR NEGATIVE NEGATIVE Final    Comment: (NOTE) The Xpert Xpress SARS-CoV-2/FLU/RSV plus assay is intended as an aid in the diagnosis of influenza from Nasopharyngeal swab specimens and should not be used as a sole basis for treatment. Nasal washings and aspirates are unacceptable for Xpert Xpress SARS-CoV-2/FLU/RSV testing.  Fact Sheet for Patients: BloggerCourse.com  Fact Sheet for Healthcare Providers: SeriousBroker.it  This test is not yet approved or cleared by the [place] FDA and has been authorized for detection and/or diagnosis of SARS-CoV-2 by FDA under an Emergency Use Authorization (EUA). This EUA will remain in effect (meaning this test can be used) for the duration of the COVID-19 declaration under Section 564(b)(1) of the Act, 21 U.S.C. section 360bbb-3(b)(1), unless the authorization is terminated or revoked.     Resp Syncytial Virus by PCR NEGATIVE NEGATIVE Final    Comment: (NOTE) Fact Sheet for Patients: BloggerCourse.com  Fact Sheet for Healthcare Providers: SeriousBroker.it  This test is not yet approved or cleared by the [place] FDA and has been authorized for detection and/or diagnosis of SARS-CoV-2 by FDA under an Emergency Use  Authorization (EUA). This EUA will remain in effect (meaning this test can be used) for the duration of the COVID-19 declaration under Section 564(b)(1) of the Act, 21 U.S.C. section 360bbb-3(b)(1), unless the authorization is terminated or revoked.  Performed at [facility], [address] [place], [place] [address]   Blood Culture (routine x 2)     Status: None (Preliminary result)   Collection Time: 11/07/23 12:15 PM   Specimen: BLOOD  Result Value Ref Range Status   Specimen Description   Final    BLOOD LEFT ANTECUBITAL Performed at [facility], [address], [place], [place] [address]    Special Requests   Final    BOTTLES DRAWN AEROBIC AND ANAEROBIC Blood Culture adequate volume  Performed at [facility], [address], [place], [place] [address]    Culture   Final    NO GROWTH 2 DAYS Performed at [facility], [address] [place], [place] [address]    Report Status PENDING  Incomplete  Blood Culture (routine x 2)     Status: None (Preliminary result)   Collection Time: 11/07/23 12:15 PM   Specimen: BLOOD RIGHT ARM  Result Value Ref Range Status   Specimen Description   Final    BLOOD RIGHT ARM Performed at [facility], [address], [place], [place] [address]    Special Requests   Final    BOTTLES DRAWN AEROBIC AND ANAEROBIC Blood Culture results may not be optimal due to an inadequate volume of blood received in culture bottles Performed at [facility], [address] [place], [place] [address]    Culture   Final    NO GROWTH 2 DAYS Performed at [facility], [address] [place], [place] [address]    Report Status PENDING  Incomplete  Urine Culture     Status: Abnormal (Preliminary result)   Collection Time: 11/07/23  1:35 PM   Specimen: Urine, Catheterized  Result Value Ref Range Status   Specimen Description   Final    URINE, CATHETERIZED Performed at [facility], [address] [place], [place] [address]    Special Requests   Final    NONE Reflexed from [id number] Performed at [facility], [address], [place], [place] [address]    Culture (A)  Final    60,000 COLONIES/mL Romie Minus NEGATIVE RODS IDENTIFICATION TO FOLLOW Performed at [facility], [address], [place], [place] [address]    Report Status PENDING  Incomplete     Radiology Studies: US RENAL Result Date: 11/07/2023 CLINICAL DATA:  Acute renal insufficiency EXAM: RENAL / URINARY TRACT ULTRASOUND COMPLETE COMPARISON:  None Available. FINDINGS: Right Kidney: Renal measurements: 8.3 x 4.8 x 3.3 cm = volume: 69.8 mL. Echogenicity within normal limits. No mass or hydronephrosis visualized. Left Kidney: Renal measurements: 7.9 x 4.8 x 3.9 cm = volume: 76.7 mL. Echogenicity within normal limits. No mass or hydronephrosis visualized. Bladder: Bladder is decompressed with a Foley catheter. Other: None. IMPRESSION: 1. Unremarkable renal ultrasound. Electronically Signed   By: Sharlet Salina M.D.   On: 11/07/2023 17:36   CT Head Wo Contrast Result Date: 11/07/2023 CLINICAL DATA:  Mental status change EXAM: CT HEAD WITHOUT CONTRAST TECHNIQUE: Contiguous axial images were obtained from the base of the skull through the vertex without intravenous contrast. RADIATION DOSE REDUCTION: This exam was performed according to the departmental dose-optimization program which includes automated exposure control, adjustment of the mA and/or kV according to patient size and/or use of iterative reconstruction technique. COMPARISON:  Head CT 10/24/2023 FINDINGS: Brain: No evidence of acute infarction, hemorrhage, hydrocephalus, extra-axial collection or mass lesion/mass effect. There is mild periventricular white matter hypodensity, likely chronic small vessel ischemic change. Vascular: No hyperdense vessel or unexpected calcification. Skull: Normal. Negative for fracture or focal  lesion. Sinuses/Orbits: There is stable complete opacification of the right maxillary sinus with some central hyperdense material. Mastoid air cells are clear. Orbits are within normal limits. Other: None. IMPRESSION: 1. No acute intracranial process. 2. Mild chronic small vessel ischemic change. 3. Stable complete opacification of the right maxillary sinus with some central hyperdense material, possibly inspissated secretions or fungal colonization. Electronically Signed  By: Darliss Cheney M.D.   On: 11/07/2023 15:29   DG Chest Port 1 View Result Date: 11/07/2023 CLINICAL DATA:  Sepsis. EXAM: PORTABLE CHEST 1 VIEW COMPARISON:  October 06, 2023. FINDINGS: The heart size and mediastinal contours are within normal limits. Both lungs are clear. The visualized skeletal structures are unremarkable. IMPRESSION: No active disease. Electronically Signed   By: Lupita Raider M.D.   On: 11/07/2023 13:40    Scheduled Meds:  aspirin EC  325 mg Oral Daily   Carbidopa-Levodopa ER  1 tablet Oral TID   Chlorhexidine Gluconate Cloth  6 each Topical Daily   docusate sodium  100 mg Oral QPM   feeding supplement  1 Container Oral TID BM   fluconazole  100 mg Oral Daily   fluticasone  1 spray Each Nare Daily   heparin  5,000 Units Subcutaneous Q8H   leptospermum manuka honey  1 Application Topical Daily   liver oil-zinc oxide   Topical BID   memantine  5 mg Oral BID   mouth rinse  15 mL Mouth Rinse 4 times per day   Continuous Infusions:  piperacillin-tazobactam (ZOSYN)  IV 3.375 g (11/09/23 0434)     LOS: 2 days   Burnadette Pop, MD [facility] P4/12/2023, 11:18 AM

## 2023-11-09 NOTE — Progress Notes (Addendum)
 Nutrition Follow-up  DOCUMENTATION CODES:   Severe malnutrition in context of chronic illness  INTERVENTION:   -D/c Ensure Enlive -Boost Breeze po TID, each supplement provides 250 kcal and 9 grams of protein  -Will monitor for SLP recommendations -If patient is deemed unsafe for POs, recommend placement of Cortrak tube for initiation of enteral feeds.   TF recommendations if Cortrak warranted: -Osmolite 1.5 @ 20 ml/hr, advance by 10 ml every 12 hours to goal rate of 50 ml/hr. -Provides 1800 kcals, 75g protein and 914 ml H2O   To aid wound healing: -Recommend Multivitamin with minerals daily -220 mg Zinc sulfate daily x 14  days -1 packet Juven BID, each packet provides 95 calories, 2.5 grams of protein (collagen), and 9.8 grams of carbohydrate (3 grams sugar); also contains 7 grams of L-arginine and L-glutamine, 300 mg vitamin C, 15 mg vitamin E, 1.2 mcg vitamin B-12, 9.5 mg zinc, 200 mg calcium, and 1.5 g  Calcium Beta-hydroxy-Beta-methylbutyrate to support wound healing   NUTRITION DIAGNOSIS:   Severe Malnutrition related to chronic illness (TBI, CVA) as evidenced by severe fat depletion, severe muscle depletion, percent weight loss.  Ongoing  GOAL:   Patient will meet greater than or equal to 90% of their needs  Unmet  MONITOR:   Diet advancement  REASON FOR ASSESSMENT:   Consult Assessment of nutrition requirement/status  ASSESSMENT:   73 year old male with history of CVA, traumatic brain injury, cognitive deficits, macrocytic anemia, frequent falls, femur fracture who presented from home for further evaluation of severe dehydration, AKI, suspicion for UTI.  Previously, he was coherent, had good appetite but recently started having frequent falls, worsening delirium and has been admitted multiple times at rehab.  Report of losing weight, not interactive, not eating or drinking.  Started on antibiotic for suspected UTI about 3 to 4 days ago.  On presentation, he was  hypothermic, hypotensive.  Lab work showed severe AKI with creatinine in the range of 4, hyponatremia.  Found to be severely dehydrated.  4/4- s/p BSE- NPO, pt wife refused cortrak  Reviewed I/O's: +625 ml x 24 hours and +4.3 L since admission  UOP: 1.1 L x 24 hours   Pt assessed by RD on 11/08/23; pt was identified with severe malnutrition and cortrak tube was recommended due to poor oral intake. Cortrak attempted by cortrak team, however, pt's wife refused placement.   Palliative care following for goals of care. Per consult note yesterday, pt wife desires full scope care. Wife reports pt had a good appetite and good quality of life up until recently. She is hopeful for stabilization of medical issues.   Medications reviewed and include sinemet, colace, and diflucan.  Labs reviewed: Na: 146, CBGS: 92.    Diet Order:   Diet Order             Diet clear liquid Room service appropriate? Yes; Fluid consistency: Thin  Diet effective now                   EDUCATION NEEDS:   Not appropriate for education at this time  Skin:  Skin Assessment: Skin Integrity Issues: Skin Integrity Issues:: Unstageable, DTI DTI: right buttocks, right heel, left heel Unstageable: sacrum  Last BM:  11/09/23 (type 5)  Height:   Ht Readings from Last 1 Encounters:  11/07/23 5\' 5"  (1.651 m)    Weight:   Wt Readings from Last 1 Encounters:  11/07/23 50.8 kg   BMI:  Body mass index is 18.64  kg/m.  Estimated Nutritional Needs:   Kcal:  1600-1800  Protein:  75-100g  Fluid:  1.8L/day    Levada Schilling, RD, LDN, CDCES Registered Dietitian III Certified Diabetes Care and Education Specialist If unable to reach this RD, please use "RD Inpatient" group chat on secure chat between hours of 8am-4 pm daily

## 2023-11-09 NOTE — Progress Notes (Signed)
 Daily Progress Note   Patient Name: Ernest Campbell       Date: 11/09/2023 DOB: July 16, 1951  Age: 73 y.o. MRN#: [id number] Attending Physician: Burnadette Pop, MD Primary Care Physician: Margot Ables, MD (Inactive) Admit Date: 11/07/2023  Reason for Consultation/Follow-up: Establishing goals of care  Subjective: Awake alert, attempts to mumble today and responds to questions asked, denies feeling hungry this am.   Length of Stay: 2  Current Medications: Scheduled Meds:   aspirin EC  325 mg Oral Daily   Carbidopa-Levodopa ER  1 tablet Oral TID   Chlorhexidine Gluconate Cloth  6 each Topical Daily   docusate sodium  100 mg Oral QPM   feeding supplement  1 Container Oral TID BM   fluconazole  100 mg Oral Daily   fluticasone  1 spray Each Nare Daily   heparin  5,000 Units Subcutaneous Q8H   leptospermum manuka honey  1 Application Topical Daily   liver oil-zinc oxide   Topical BID   memantine  5 mg Oral BID   mouth rinse  15 mL Mouth Rinse 4 times per day    Continuous Infusions:  piperacillin-tazobactam (ZOSYN)  IV 3.375 g (11/09/23 0434)    PRN Meds: acetaminophen **OR** acetaminophen, albuterol, ondansetron **OR** ondansetron (ZOFRAN) IV, mouth rinse, polyethylene glycol  Physical Exam         Awake alert Appears chronically ill Attempts to verbalize some and follows commands this am Regular work of breathing Thinly built  Vital Signs: BP 100/67 (BP Location: Left Arm)   Pulse 60   Temp (!) 97.5 F (36.4 C) (Oral)   Resp 14   Ht 5\' 5"  (1.651 m)   Wt 50.8 kg   SpO2 100%   BMI 18.64 kg/m  SpO2: SpO2: 100 % O2 Device: O2 Device: Room Air O2 Flow Rate: O2 Flow Rate (L/min): 2 L/min  Intake/output summary:  Intake/Output Summary (Last 24 hours) at 11/09/2023  0948 Last data filed at 11/09/2023 0600 Gross per 24 hour  Intake 1724.97 ml  Output 650 ml  Net 1074.97 ml   LBM: Last BM Date : 11/07/23 Baseline Weight: Weight: 50.8 kg Most recent weight: Weight: 50.8 kg       Palliative Assessment/Data:      Patient Active Problem List   Diagnosis Date Noted   Hypernatremia  11/07/2023   Femur fracture (HCC) 10/24/2023   Protein-calorie malnutrition, severe 09/29/2023   Closed right hip fracture, initial encounter (HCC) 09/25/2023   Cervical spondylosis 09/25/2023   Paraparesis (HCC) 09/25/2023   Macrocytic anemia 09/25/2023   Hypocalcemia 09/25/2023   Altered mental status 07/21/2023   CVA (cerebral vascular accident) (HCC) 07/18/2023   Shuffling gait 07/03/2021   Episodes of formed visual hallucinations 07/03/2021   Nocturnal enuresis 07/03/2021   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 Patient admitted with severe dehydration acute kidney injury suspicion for urinary tract infection. Patient admitted to hospital medicine service started on antibiotics antifungal and IV fluids, antibiotics and speech therapy also consulted for p.o. diet recommendations. Palliative consult for CODE STATUS about goals of care discussions.  Assessment:  Severe malnutrition related to chronic illness TBI CVA Recent UTI Frequent falls, recent femur fracture.   Recommendations/Plan: Discussed with TRH MD, patient's wife refused Cortrak placement, SLP note reviewed, monitor patient's PO intake. Full code, full scope, continue current mode of care, recommend addition of palliative  services at [facility] facility whenever the patient is medically stable enough to go back. No other inpatient PMT specific recommendations at this time.      Code Status:    Code Status Orders  (From admission, onward)           Start     Ordered   11/07/23 1645  Full code  Continuous       Question:  By:  Answer:  Consent: discussion documented in EHR   11/07/23 1646           Code Status History     Date Active Date Inactive Code Status Order ID Comments User Context   11/07/2023 1603 11/07/2023 1646 Full Code [id number]  Delos Haring ED   10/24/2023 2130 10/29/2023 2035 Full Code [id number]  Eduard Clos, MD ED   10/24/2023 [phone] 10/24/2023 [phone] Limited: Do not attempt resuscitation (DNR) -DNR-LIMITED -Do Not Intubate/DNI  [id number]  Eduard Clos, MD ED   09/25/2023 1419 09/30/2023 1824 Full Code [id number]  Bobette Mo, MD ED   09/25/2023 1419 09/25/2023 1419 Full Code [id number]  Bobette Mo, MD ED   07/19/2023 0019 07/21/2023 2025 Full Code [id number]  Buena Irish, MD ED      Advance Directive Documentation    Flowsheet Row Most Recent Value  Type of Advance Directive Living will, Healthcare Power of Attorney  Pre-existing out of facility DNR order (yellow form or pink MOST form) --  "MOST" Form in Place? --       Prognosis:  Unable to determine  Discharge Planning: Recommend back to [facility] with palliative on discharge.   Care plan was discussed with  Dr Renford Dills and also the patient.   Thank you for allowing the Palliative Medicine Team to assist in the care of this patient. Mod MDM.      Greater than 50%  of this time was spent counseling and coordinating care related to the above assessment and plan.  Rosalin Hawking, MD  Please contact Palliative Medicine Team phone at [phone] for questions and concerns.

## 2023-11-09 NOTE — Evaluation (Addendum)
 Physical Therapy Evaluation Patient Details Name: Ernest Campbell MRN: [id number] DOB: Dec 16, 1950 Today's Date: 11/09/2023  History of Present Illness  72 year old male who presented from home for further evaluation of severe dehydration, AKI, suspicion for UTI. Pt with history of CVA, traumatic brain injury, cognitive deficits, macrocytic anemia, frequent falls, femur fracture s/p ORIF 09/26/2023, then periprosthetic fx s/p ORIF 10/24/23.  Clinical Impression  Pt admitted with above diagnosis. Pt sat edge of bed for ~10 minutes with max A for trunk support. Pt not verbal this session but wife reports pt does verbalize at times. Pt was able to follow 1 step commands with increased time. Wife reports pt was not getting out of bed at the SNF where he was residing prior to this admission. [place]ove utilized for bed to recliner transfer. Patient will benefit from continued inpatient follow up therapy, <3 hours/day.  Pt currently with functional limitations due to the deficits listed below (see PT Problem List). Pt will benefit from acute skilled PT to increase their independence and safety with mobility to allow discharge.           If plan is discharge home, recommend the following: Two people to help with walking and/or transfers;Two people to help with bathing/dressing/bathroom;Assistance with feeding;Direct supervision/assist for medications management   Can travel by private vehicle   No    Equipment Recommendations Hoyer lift;Hospital bed  Recommendations for Other Services       Functional Status Assessment Patient has had a recent decline in their functional status and demonstrates the ability to make significant improvements in function in a reasonable and predictable amount of time.     Precautions / Restrictions Precautions Precautions: Fall Recall of Precautions/Restrictions: Impaired Restrictions Weight Bearing Restrictions Per Provider Order: No Other Position/Activity  Restrictions: RLE WBAT with RW      Mobility  Bed Mobility Overal bed mobility: Needs Assistance Bed Mobility: Rolling, Supine to Sit Rolling: Total assist, +2 for physical assistance   Supine to sit: Total assist, +2 for physical assistance     General bed mobility comments: rolled L and R to place maxi move pad, pt able to grip bed rail during roll; +2 total assist for supine to sit (pt 0%); pt sat edge of bed for ~10 minutes with max A for trunk support    Transfers     Transfers: Bed to chair/wheelchair/BSC             General transfer comment: Maxi move for bed to recliner transfer    Ambulation/Gait                  Stairs            Wheelchair Mobility     Tilt Bed    Modified Rankin (Stroke Patients Only)       Balance Overall balance assessment: Needs assistance Sitting-balance support: Feet supported, Bilateral upper extremity supported Sitting balance-Leahy Scale: Zero Sitting balance - Comments: posterior bias and max assist for trunk support on EOB       Standing balance comment: unable to come to complete standing                             Pertinent Vitals/Pain Pain Assessment Faces Pain Scale: No hurt Breathing: normal Negative Vocalization: none Facial Expression: smiling or inexpressive Body Language: relaxed Consolability: no need to console PAINAD Score: 0    Home Living Family/patient expects to be discharged to::  Skilled nursing facility                   Additional Comments: from SNF    Prior Function Prior Level of Function : Needs assist  Cognitive Assist : ADLs (cognitive);Mobility (cognitive)     Physical Assist : Mobility (physical);ADLs (physical)     Mobility Comments: wife reports pt was not getting [facility], prior to December 2024 pt was independent with mobility (wife reports sudden physical and mental decline in December after receiving fentanyl and other medication in  ED) ADLs Comments: total assist needed     Extremity/Trunk Assessment   Upper Extremity Assessment Upper Extremity Assessment: Defer to OT evaluation    Lower Extremity Assessment Lower Extremity Assessment: Difficult to assess due to impaired cognition RLE Deficits / Details: tolerated gentle ROM to R knee/hip, no signs of pain    Cervical / Trunk Assessment Cervical / Trunk Assessment: Kyphotic  Communication   Communication Communication: Impaired Factors Affecting Communication: Difficulty expressing self;Reduced clarity of speech    Cognition Arousal: Alert Behavior During Therapy: Flat affect   PT - Cognitive impairments: History of cognitive impairments                       PT - Cognition Comments: history of dementia, TBI, CVA. Pt non verbal this session, wife reports pt does verbalize at times. Following commands: Impaired Following commands impaired: Follows one step commands with increased time, Follows one step commands inconsistently     Cueing Cueing Techniques: Verbal cues, Gestural cues, Tactile cues     General Comments      Exercises General Exercises - Lower Extremity Heel Slides: PROM, Right, 10 reps, Supine   Assessment/Plan    PT Assessment Patient needs continued PT services  PT Problem List Decreased strength;Decreased mobility;Decreased activity tolerance;Decreased balance;Decreased knowledge of use of DME;Pain;Decreased range of motion;Decreased cognition;Decreased coordination;Decreased safety awareness       PT Treatment Interventions DME instruction;Therapeutic activities;Therapeutic exercise;Patient/family education;Balance training;Stair training;Functional mobility training;Neuromuscular re-education    PT Goals (Current goals can be found in the Care Plan section)  Acute Rehab PT Goals Patient Stated Goal: to get strong enough to go home PT Goal Formulation: With family Time For Goal Achievement: 11/23/23 Potential to  Achieve Goals: Fair    Frequency Min 2X/week     Co-evaluation               AM-PAC PT "6 Clicks" Mobility  Outcome Measure Help needed turning from your back to your side while in a flat bed without using bedrails?: Total Help needed moving from lying on your back to sitting on the side of a flat bed without using bedrails?: Total Help needed moving to and from a bed to a chair (including a wheelchair)?: Total Help needed standing up from a chair using your arms (e.g., wheelchair or bedside chair)?: Total Help needed to walk in hospital room?: Total Help needed climbing 3-5 steps with a railing? : Total 6 Click Score: 6    End of Session Equipment Utilized During Treatment: Gait belt Activity Tolerance: Patient tolerated treatment well Patient left: in chair;with chair alarm set;with call bell/phone within reach;with family/visitor present Nurse Communication: Mobility status;Need for lift equipment PT Visit Diagnosis: Other abnormalities of gait and mobility (R26.89);Muscle weakness (generalized) (M62.81)    Time: 1610-9604 PT Time Calculation (min) (ACUTE ONLY): 30 min   Charges:   PT Evaluation $PT Eval Moderate Complexity: 1 Mod PT Treatments $  Therapeutic Activity: 8-22 mins PT General Charges $$ ACUTE PT VISIT: 1 Visit         Tamala Ser PT 11/09/2023  Acute Rehabilitation Services  Office [phone]

## 2023-11-09 NOTE — Plan of Care (Signed)
   Problem: Elimination: Goal: Will not experience complications related to bowel motility Outcome: Progressing   Problem: Safety: Goal: Ability to remain free from injury will improve Outcome: Progressing   Problem: Skin Integrity: Goal: Risk for impaired skin integrity will decrease Outcome: Progressing

## 2023-11-10 DIAGNOSIS — E870 Hyperosmolality and hypernatremia: Secondary | ICD-10-CM | POA: Diagnosis not present

## 2023-11-10 LAB — BASIC METABOLIC PANEL WITH GFR
Anion gap: 8 (ref 5–15)
BUN: 31 mg/dL — ABNORMAL HIGH (ref 8–23)
CO2: 24 mmol/L (ref 22–32)
Calcium: 8.2 mg/dL — ABNORMAL LOW (ref 8.9–10.3)
Chloride: 115 mmol/L — ABNORMAL HIGH (ref 98–111)
Creatinine, Ser: 0.87 mg/dL (ref 0.61–1.24)
GFR, Estimated: >60 mL/min (ref >60)
Glucose, Bld: 102 mg/dL — ABNORMAL HIGH (ref 70–99)
Potassium: 3.4 mmol/L — ABNORMAL LOW (ref 3.5–5.1)
Sodium: 147 mmol/L — ABNORMAL HIGH (ref 135–145)

## 2023-11-10 LAB — URINE CULTURE
Culture: 60000 — AB
Culture: <10000 — AB

## 2023-11-10 MED ORDER — LIDOCAINE 5 % EX PTCH
1.0000 | MEDICATED_PATCH | CUTANEOUS | Status: DC
  Administered 2023-11-10 – 2023-11-12 (×3): 1 via TRANSDERMAL
  Filled 2023-11-10 (×3): qty 1

## 2023-11-10 MED ORDER — POTASSIUM CHLORIDE CRYS ER 20 MEQ PO TBCR
40.0000 meq | EXTENDED_RELEASE_TABLET | Freq: Once | ORAL | Status: AC
  Administered 2023-11-10: 40 meq via ORAL
  Filled 2023-11-10: qty 2

## 2023-11-10 MED ORDER — DICLOFENAC SODIUM 1 % EX GEL
2.0000 g | Freq: Four times a day (QID) | CUTANEOUS | Status: DC
  Administered 2023-11-10 – 2023-11-12 (×8): 2 g via TOPICAL
  Filled 2023-11-10: qty 100

## 2023-11-10 NOTE — Progress Notes (Signed)
 Patient wife at bedside, says she will bring his dentures in tomorrow and wants speech to reassess at that time.

## 2023-11-10 NOTE — Progress Notes (Signed)
 Daily Progress Note   Patient Name: Ernest Campbell       Date: 11/10/2023 DOB: 06-21-1951  Age: 73 y.o. MRN#: [id number] Attending Physician: Burnadette Pop, MD Primary Care Physician: Margot Ables, MD (Inactive) Admit Date: 11/07/2023  Reason for Consultation/Follow-up: Establishing goals of care  Subjective: Currently asleep, arouses to voice command.  Wife on the phone and the niece at bedside.  About 35 to 40% of his breakfast tray.  Wife states that they are for all 3 meals yesterday on 4 - 5 - 25 the patient ate around 40% of his tray.  She is going to get the patient's teeth dentures from his facility and is hopeful that the patient's diet will be able to be advanced to once his dentures are available.  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  Length of Stay: 3  Current Medications: Scheduled Meds:   aspirin EC  325 mg Oral Daily   Carbidopa-Levodopa ER  1 tablet Oral TID   Chlorhexidine Gluconate Cloth  6 each Topical Daily   diclofenac Sodium  2 g Topical QID   docusate sodium  100 mg Oral QPM   feeding supplement  1 Container Oral TID BM   fluconazole  100 mg Oral Daily   fluticasone  1 spray Each Nare Daily   heparin  5,000 Units Subcutaneous Q8H   leptospermum manuka honey  1 Application Topical Daily   lidocaine  1 patch Transdermal Q24H   liver oil-zinc oxide   Topical BID   memantine  5 mg Oral BID   mouth rinse  15 mL Mouth Rinse 4 times per day    Continuous Infusions:  piperacillin-tazobactam (ZOSYN)  IV 3.375 g (11/10/23 0448)    PRN Meds: acetaminophen **OR** acetaminophen, albuterol, ondansetron **OR** ondansetron (ZOFRAN) IV, mouth rinse, polyethylene  glycol  Physical Exam         Awake alert Appears chronically ill Regular work of breathing Thinly built  Vital Signs: BP (!) 108/59 (BP Location: Left Arm)   Pulse (!) 59   Temp 97.9 F (36.6 C) (Oral)   Resp 14   Ht 5\' 5"  (1.651 m)   Wt 50.8 kg   SpO2 96%   BMI 18.64 kg/m  SpO2: SpO2: 96 % O2 Device: O2 Device: Room Air O2 Flow Rate: O2 Flow Rate (L/min): 2 L/min  Intake/output summary:  Intake/Output Summary (Last 24 hours) at 11/10/2023 1115 Last data filed at 11/09/2023 1800 Gross per 24 hour  Intake 240 ml  Output 700 ml  Net -460 ml   LBM: Last BM Date : 11/10/23 Baseline Weight: Weight: 50.8 kg Most recent weight: Weight: 50.8 kg       Palliative Assessment/Data:      Patient Active Problem List   Diagnosis Date Noted   Hypernatremia 11/07/2023   Femur fracture (HCC) 10/24/2023   Protein-calorie malnutrition, severe 09/29/2023   Closed right hip fracture, initial encounter (HCC) 09/25/2023   Cervical spondylosis 09/25/2023   Paraparesis (HCC) 09/25/2023   Macrocytic anemia 09/25/2023   Hypocalcemia 09/25/2023   Altered mental status 07/21/2023   CVA (cerebral vascular accident) (HCC) 07/18/2023   Shuffling gait 07/03/2021   Episodes of formed visual hallucinations 07/03/2021   Nocturnal enuresis 07/03/2021   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 Patient admitted with severe dehydration acute kidney injury suspicion for urinary tract infection. Patient admitted to hospital medicine service started on antibiotics antifungal and IV fluids, antibiotics and speech therapy also  consulted for p.o. diet recommendations. Palliative consult for CODE STATUS about goals of care discussions.  Assessment:  Severe malnutrition related to chronic illness TBI CVA Recent UTI Frequent falls, recent femur fracture.   Recommendations/Plan: Discussed with the patient's wife on the phone, niece also present at bedside.  They wish to continue current mode of care.  Full code.  For now, will go ahead and add lidocaine patches as well as Voltaren gel.  Continue antibiotics and all efforts at increasing p.o. intake.  Recommend addition of palliative services and patient is medically stable to go back to his nursing facility.  Continue Tylenol on an as-needed basis for arthritis.  Monitor p.o. intake.  Code Status:    Code Status Orders  (From admission, onward)           Start     Ordered   11/07/23 1645  Full code  Continuous       Question:  By:  Answer:  Consent: discussion documented in EHR   11/07/23 1646           Code Status History     Date Active Date Inactive Code Status Order ID Comments User Context   11/07/2023 1603 11/07/2023 1646 Full Code [id number]  Delos Haring ED   10/24/2023 8657 10/29/2023 2035 Full Code [id number]  Eduard Clos, MD ED   10/24/2023 [phone] 10/24/2023 [phone] Limited: Do not attempt resuscitation (DNR) -DNR-LIMITED -Do Not Intubate/DNI  [id number]  Eduard Clos, MD ED   09/25/2023 1419 09/30/2023 1824 Full Code [id number]  Bobette Mo, MD ED   09/25/2023 1419 09/25/2023 1419 Full Code [id number]  Bobette Mo, MD ED   07/19/2023 0019 07/21/2023 2025 Full Code [id number]  Buena Irish, MD ED      Advance Directive Documentation    Flowsheet Row Most Recent Value  Type of Advance Directive Living will, Healthcare Power of [place]  Pre-existing out of facility DNR order (yellow form or pink MOST form) --  "MOST" Form in Place? --       Prognosis:  Unable to determine  Discharge Planning: Recommend  back to [facility] with palliative on discharge.   Care plan was discussed with  wife niece  Thank you for allowing the Palliative Medicine Team to assist in the care of this patient. Mod MDM.      Greater than 50%  of this time was spent counseling and coordinating care related to the above assessment and plan.  Rosalin Hawking, MD  Please contact Palliative Medicine Team phone at [phone] for questions and concerns.

## 2023-11-10 NOTE — Progress Notes (Signed)
 PROGRESS NOTE  Ernest Campbell  ZOX:[id number] DOB: 1951-07-23 DOA: 11/07/2023 PCP: Margot Ables, MD (Inactive)   Brief Narrative:  Patient is a 73 year old male with history of CVA, traumatic brain injury, cognitive deficits, macrocytic anemia, frequent falls, femur fracture who presented from home for further evaluation of severe dehydration, AKI, suspicion for UTI.  Previously, he was coherent, had good appetite but recently started having frequent falls, worsening delirium and has been admitted multiple times at rehab.  Report of losing weight, not interactive, not eating or drinking.  Started on antibiotic for suspected UTI about 3 to 4 days ago.  On presentation, he was hypothermic, hypotensive.  Lab work showed severe AKI with creatinine in the range of 4, hyponatremia.  Found to be severely dehydrated.  Started on antibiotic, IV fluid.  Nutritionist consulted.  Palliative care care consulted  for goals of care.  Now clinically improved but continues to have poor oral intake.  Remains full code.  PT/OT recommending SNF on discharge.  Assessment & Plan:  Principal Problem:   Hypernatremia   Severe dehydration/hypernatremia/AKI/poor oral intake: Likely from poor oral intake for last several days.  Patient has been more confused at home, declining, losing weight.  Hypernatremia improved  Hypotension: Likely from severe dehydration.   Currently blood pressure stable  Suspected sepsis/UTI: Follow-up cultures.  Currently on Zosyn.  Not sure if patient had dysuria.  Urine culture growing significant gram-negative rods, will follow final culture report  Oral thrush: Started on fluconazole  AKI: Likely from combination of severe dehydration, UTI, hypotension.  Found to have concentrated urine. Now resolved. Renal ultrasound did not show any obstruction  Severe failure to thrive: Nutritionist consulted..Palliative care consulted for goals of care.  Patient has recurrent admission,  multiple falls.  Very poor quality of life.  History of CVA, traumatic brain injury, brain hemorrhage  on 2022,cognitive deficits.Significantly declined after Dec 2024.He was eating well until few days ago. Dietitian consulted.Speech therapy also consulted. Wife declined tube feeding/PEG, it is not his will .Started on dysphagia 1 diet.  Continues to have poor oral intake. PT/OT recommending SNF on discharge  Hypokalemia: Supplemented with potassium  Chronic normocytic anemia: Continue to monitor hemoglobin.  No evidence of acute blood loss.  Could be associated with malnutrition as well  Pressure Injury 11/07/23 Sacrum Mid Unstageable - Full thickness tissue loss in which the base of the injury is covered by slough (yellow, tan, gray, green or brown) and/or eschar (tan, brown or black) in the wound bed. large area with yellow-ish base, s (Active)  11/07/23 1845  Location: Sacrum  Location Orientation: Mid  Staging: Unstageable - Full thickness tissue loss in which the base of the injury is covered by slough (yellow, tan, gray, green or brown) and/or eschar (tan, brown or black) in the wound bed.  Wound Description (Comments): large area with yellow-ish base, slight drainage  Present on Admission: Yes     Pressure Injury 11/07/23 Buttocks Right Deep Tissue Pressure Injury - Purple or maroon localized area of discolored intact skin or blood-filled blister due to damage of underlying soft tissue from pressure and/or shear. (Active)  11/07/23 1845  Location: Buttocks  Location Orientation: Right  Staging: Deep Tissue Pressure Injury - Purple or maroon localized area of discolored intact skin or blood-filled blister due to damage of underlying soft tissue from pressure and/or shear.  Wound Description (Comments):   Present on Admission: Yes     Pressure Injury 11/07/23 Heel Right Deep Tissue Pressure Injury -  Purple or maroon localized area of discolored intact skin or blood-filled blister due  to damage of underlying soft tissue from pressure and/or shear. (Active)  11/07/23 1845  Location: Heel  Location Orientation: Right  Staging: Deep Tissue Pressure Injury - Purple or maroon localized area of discolored intact skin or blood-filled blister due to damage of underlying soft tissue from pressure and/or shear.  Wound Description (Comments):   Present on Admission: Yes     Pressure Injury 11/07/23 Heel Left Deep Tissue Pressure Injury - Purple or maroon localized area of discolored intact skin or blood-filled blister due to damage of underlying soft tissue from pressure and/or shear. (Active)  11/07/23 1905  Location: Heel  Location Orientation: Left  Staging: Deep Tissue Pressure Injury - Purple or maroon localized area of discolored intact skin or blood-filled blister due to damage of underlying soft tissue from pressure and/or shear.  Wound Description (Comments):   Present on Admission: Yes       Nutrition Problem: Severe Malnutrition Etiology: chronic illness (TBI, CVA) Pressure Injury 11/07/23 Sacrum Mid Unstageable - Full thickness tissue loss in which the base of the injury is covered by slough (yellow, tan, gray, green or brown) and/or eschar (tan, brown or black) in the wound bed. large area with yellow-ish base, s (Active)  11/07/23 1845  Location: Sacrum  Location Orientation: Mid  Staging: Unstageable - Full thickness tissue loss in which the base of the injury is covered by slough (yellow, tan, gray, green or brown) and/or eschar (tan, brown or black) in the wound bed.  Wound Description (Comments): large area with yellow-ish base, slight drainage  Present on Admission: Yes  Dressing Type Foam - Lift dressing to assess site every shift 11/09/23 0942     Pressure Injury 11/07/23 Buttocks Right Deep Tissue Pressure Injury - Purple or maroon localized area of discolored intact skin or blood-filled blister due to damage of underlying soft tissue from pressure and/or  shear. (Active)  11/07/23 1845  Location: Buttocks  Location Orientation: Right  Staging: Deep Tissue Pressure Injury - Purple or maroon localized area of discolored intact skin or blood-filled blister due to damage of underlying soft tissue from pressure and/or shear.  Wound Description (Comments):   Present on Admission: Yes  Dressing Type Foam - Lift dressing to assess site every shift 11/09/23 0942     Pressure Injury 11/07/23 Heel Right Deep Tissue Pressure Injury - Purple or maroon localized area of discolored intact skin or blood-filled blister due to damage of underlying soft tissue from pressure and/or shear. (Active)  11/07/23 1845  Location: Heel  Location Orientation: Right  Staging: Deep Tissue Pressure Injury - Purple or maroon localized area of discolored intact skin or blood-filled blister due to damage of underlying soft tissue from pressure and/or shear.  Wound Description (Comments):   Present on Admission: Yes  Dressing Type Gauze (Comment) 11/09/23 0942     Pressure Injury 11/07/23 Heel Left Deep Tissue Pressure Injury - Purple or maroon localized area of discolored intact skin or blood-filled blister due to damage of underlying soft tissue from pressure and/or shear. (Active)  11/07/23 1905  Location: Heel  Location Orientation: Left  Staging: Deep Tissue Pressure Injury - Purple or maroon localized area of discolored intact skin or blood-filled blister due to damage of underlying soft tissue from pressure and/or shear.  Wound Description (Comments):   Present on Admission: Yes  Dressing Type Gauze (Comment) 11/09/23 0942    DVT prophylaxis:heparin injection 5,000 Units Start:  11/07/23 2200     Code Status: Full Code  Family Communication: discussed with niece at bedside on 4/6  Patient status:Inpatient  Patient is from :Home  Anticipated discharge to:SNF  Estimated DC date:1-2 days   Consultants: palliative care  Procedures:None  Antimicrobials:   Anti-infectives (From admission, onward)    Start     Dose/Rate Route Frequency Ordered Stop   11/08/23 0600  piperacillin-tazobactam (ZOSYN) IVPB 3.375 g  Status:  Discontinued        3.375 g 12.5 mL/hr over 240 Minutes Intravenous Every 12 hours 11/07/23 1757 11/08/23 0247   11/08/23 0400  piperacillin-tazobactam (ZOSYN) IVPB 3.375 g        3.375 g 12.5 mL/hr over 240 Minutes Intravenous Every 8 hours 11/08/23 0247     11/07/23 2000  fluconazole (DIFLUCAN) tablet 100 mg        100 mg Oral Daily 11/07/23 1729     11/07/23 1700  piperacillin-tazobactam (ZOSYN) IVPB 3.375 g        3.375 g 100 mL/hr over 30 Minutes Intravenous  Once 11/07/23 1648 11/07/23 2045   11/07/23 1200  cefTRIAXone (ROCEPHIN) 2 g in sodium chloride 0.9 % 100 mL IVPB        2 g 200 mL/hr over 30 Minutes Intravenous Once 11/07/23 1147 11/07/23 1234       Subjective: Patient seen and examined at bedside today.  Hemodynamically stable.  Lying in bed.  Niece at bedside.  She was trying to feed her.  Ate few bites.  Continues to remain weak and deconditioned but not in apparent distress or agitated.  Objective: Vitals:   11/09/23 0857 11/09/23 1258 11/09/23 2029 11/10/23 0528  BP: 100/67 (!) 109/51 (!) 98/52 (!) 108/59  Pulse: 60 78 (!) 59   Resp: 14 16 14 14   Temp: (!) 97.5 F (36.4 C) 97.7 F (36.5 C) (!) 97.5 F (36.4 C) 97.9 F (36.6 C)  TempSrc: Oral Oral Oral Oral  SpO2:  100% 96%   Weight:      Height:        Intake/Output Summary (Last 24 hours) at 11/10/2023 1116 Last data filed at 11/09/2023 1800 Gross per 24 hour  Intake 240 ml  Output 700 ml  Net -460 ml   Filed Weights   11/07/23 1853  Weight: 50.8 kg    Examination:   General exam: Overall comfortable, not in distress, deconditioned, malnourished HEENT: PERRL Respiratory system:  no wheezes or crackles  Cardiovascular system: S1 & S2 heard, RRR.  Gastrointestinal system: Abdomen is nondistended, soft and nontender. Central  nervous system: Alert and awake but mostly nonverbal Extremities: No edema, no clubbing ,no cyanosis Skin: Pressure ulcers   Data Reviewed: I have personally reviewed following labs and imaging studies  CBC: Recent Labs  Lab 11/07/23 1215 11/08/23 0513 11/09/23 0536  WBC 7.0 7.5 7.9  NEUTROABS 4.9  --   --   HGB 10.6* 7.6* 9.1*  HCT 34.6* 26.6* 30.6*  MCV 105.2* 112.2* 108.1*  PLT 514* 343 362   Basic Metabolic Panel: Recent Labs  Lab 11/07/23 2341 11/08/23 0513 11/08/23 0818 11/09/23 0536 11/10/23 0515  NA 147* 152* 153* 146* 147*  K 3.6 4.3 3.9 3.9 3.4*  CL 121* 126* 125* 120* 115*  CO2 20* 20* 19* 21* 24  GLUCOSE 87 85 87 117* 102*  BUN 93* 84* 79* 50* 31*  CREATININE 2.15* 1.88* 1.82* 1.17 0.87  CALCIUM 7.2* 8.1* 8.1* 8.1* 8.2*     Recent  Results (from the past 240 hours)  Resp panel by RT-PCR (RSV, Flu A&B, Covid) Anterior Nasal Swab     Status: None   Collection Time: 11/07/23 11:54 AM   Specimen: Anterior Nasal Swab  Result Value Ref Range Status   SARS Coronavirus 2 by RT PCR NEGATIVE NEGATIVE Final    Comment: (NOTE) SARS-CoV-2 target nucleic acids are NOT DETECTED.  The SARS-CoV-2 RNA is generally detectable in upper respiratory specimens during the acute phase of infection. The lowest concentration of SARS-CoV-2 viral copies this assay can detect is 138 copies/mL. A negative result does not preclude SARS-Cov-2 infection and should not be used as the sole basis for treatment or other patient management decisions. A negative result may occur with  improper specimen collection/handling, submission of specimen other than nasopharyngeal swab, presence of viral mutation(s) within the areas targeted by this assay, and inadequate number of viral copies(<138 copies/mL). A negative result must be combined with clinical observations, patient history, and epidemiological information. The expected result is Negative.  Fact Sheet for Patients:   BloggerCourse.com  Fact Sheet for Healthcare Providers:  SeriousBroker.it  This test is no t yet approved or cleared by the [place] FDA and  has been authorized for detection and/or diagnosis of SARS-CoV-2 by FDA under an Emergency Use Authorization (EUA). This EUA will remain  in effect (meaning this test can be used) for the duration of the COVID-19 declaration under Section 564(b)(1) of the Act, 21 U.S.C.section 360bbb-3(b)(1), unless the authorization is terminated  or revoked sooner.       Influenza A by PCR NEGATIVE NEGATIVE Final   Influenza B by PCR NEGATIVE NEGATIVE Final    Comment: (NOTE) The Xpert Xpress SARS-CoV-2/FLU/RSV plus assay is intended as an aid in the diagnosis of influenza from Nasopharyngeal swab specimens and should not be used as a sole basis for treatment. Nasal washings and aspirates are unacceptable for Xpert Xpress SARS-CoV-2/FLU/RSV testing.  Fact Sheet for Patients: BloggerCourse.com  Fact Sheet for Healthcare Providers: SeriousBroker.it  This test is not yet approved or cleared by the [place] FDA and has been authorized for detection and/or diagnosis of SARS-CoV-2 by FDA under an Emergency Use Authorization (EUA). This EUA will remain in effect (meaning this test can be used) for the duration of the COVID-19 declaration under Section 564(b)(1) of the Act, 21 U.S.C. section 360bbb-3(b)(1), unless the authorization is terminated or revoked.     Resp Syncytial Virus by PCR NEGATIVE NEGATIVE Final    Comment: (NOTE) Fact Sheet for Patients: BloggerCourse.com  Fact Sheet for Healthcare Providers: SeriousBroker.it  This test is not yet approved or cleared by the [place] FDA and has been authorized for detection and/or diagnosis of SARS-CoV-2 by FDA under an Emergency Use  Authorization (EUA). This EUA will remain in effect (meaning this test can be used) for the duration of the COVID-19 declaration under Section 564(b)(1) of the Act, 21 U.S.C. section 360bbb-3(b)(1), unless the authorization is terminated or revoked.  Performed at [facility], [address] [place], [place] [address]   Blood Culture (routine x 2)     Status: None (Preliminary result)   Collection Time: 11/07/23 12:15 PM   Specimen: BLOOD  Result Value Ref Range Status   Specimen Description   Final    BLOOD LEFT ANTECUBITAL Performed at [facility], [address], [place], [place] [address]    Special Requests   Final    BOTTLES DRAWN AEROBIC AND ANAEROBIC Blood  Culture adequate volume Performed at [facility], [address], [place], [place] [address]    Culture   Final    NO GROWTH 3 DAYS Performed at [facility], [address], [place], [place] [address]    Report Status PENDING  Incomplete  Blood Culture (routine x 2)     Status: None (Preliminary result)   Collection Time: 11/07/23 12:15 PM   Specimen: BLOOD RIGHT ARM  Result Value Ref Range Status   Specimen Description   Final    BLOOD RIGHT ARM Performed at [facility], [address] [place], [place] [address]    Special Requests   Final    BOTTLES DRAWN AEROBIC AND ANAEROBIC Blood Culture results may not be optimal due to an inadequate volume of blood received in culture bottles Performed at [facility], [address] [place], [place] [address]    Culture   Final    NO GROWTH 3 DAYS Performed at [facility], [address] [place], [place] [address]    Report Status PENDING  Incomplete  Urine Culture     Status: Abnormal (Preliminary result)   Collection Time: 11/07/23  1:35 PM   Specimen: Urine, Catheterized  Result Value Ref Range Status   Specimen Description   Final    URINE, CATHETERIZED Performed at [facility], [address] [place], [place] [address]    Special Requests   Final    NONE Reflexed from [id number] Performed at [facility], [address], [place], [place] [address]    Culture (A)  Final    60,000 COLONIES/mL ESCHERICHIA COLI SUSCEPTIBILITIES TO FOLLOW Performed at [facility], [address] [place], [place] [address]    Report Status PENDING  Incomplete     Radiology Studies: No results found.   Scheduled Meds:  aspirin EC  325 mg Oral Daily   Carbidopa-Levodopa ER  1 tablet Oral TID   Chlorhexidine Gluconate Cloth  6 each Topical Daily   diclofenac Sodium  2 g Topical QID   docusate sodium  100 mg Oral QPM   feeding supplement  1 Container Oral TID BM   fluconazole  100 mg Oral Daily   fluticasone  1 spray Each Nare Daily   heparin  5,000 Units Subcutaneous Q8H   leptospermum manuka honey  1 Application Topical Daily   lidocaine  1 patch Transdermal Q24H   liver oil-zinc oxide   Topical BID   memantine  5 mg Oral BID   mouth rinse  15 mL Mouth Rinse 4 times per day   Continuous Infusions:  piperacillin-tazobactam (ZOSYN)  IV 3.375 g (11/10/23 0448)     LOS: 3 days   Burnadette Pop, MD [facility] P4/01/2024, 11:16 AM

## 2023-11-10 NOTE — Progress Notes (Signed)
 Pharmacy Antibiotic Note  Ernest Campbell is a 73 y.o. male admitted on 11/07/2023 with sepsis, UTI, and aspiration .  Pharmacy has been consulted for Zosyn dosing.  Plan: Continue Zosyn 3.375 g infused over 4 hours every 8  hours Follow up renal function, culture results, and clinical course.   Height: 5\' 5"  (165.1 cm) Weight: 50.8 kg (111 lb 15.9 oz) IBW/kg (Calculated) : 61.5  Temp (24hrs), Avg:97.7 F (36.5 C), Min:97.5 F (36.4 C), Max:97.9 F (36.6 C)  Recent Labs  Lab 11/07/23 1215 11/07/23 1217 11/07/23 1546 11/07/23 1952 11/07/23 2341 11/08/23 0513 11/08/23 0818 11/09/23 0536 11/10/23 0515  WBC 7.0  --   --   --   --  7.5  --  7.9  --   CREATININE 4.17*  --   --    < > 2.15* 1.88* 1.82* 1.17 0.87  LATICACIDVEN  --  1.9 1.0  --   --   --   --   --   --    < > = values in this interval not displayed.    Estimated Creatinine Clearance: 56.8 mL/min (by C-G formula based on SCr of 0.87 mg/dL).    Allergies  Allergen Reactions   Fentanyl And Related Anaphylaxis   Other Other (See Comments)    According to the patient, all opioids cause hallucinations    Prednisone Other (See Comments)    Hallucinations - "Allergic," per MAR   Codeine Other (See Comments)    Hallucinations - "Allergic," per MAR   Morphine Other (See Comments)    Hallucinations - "Allergic," per [facility]   Oxycodone Other (See Comments)    Hallucinations - "Allergic," per MAR    Antimicrobials this admission:  4/3 Ceftiaxone x1 4/3 Zosyn >>   Microbiology results:  4/3 BCx: NGTD 4/3 Resp panel: negative  4/3 UCx: 60k colonies of E.coli 4/4 UCx:    Dosage will likely remain stable at above dosage and need for further dosage adjustment appears unlikely at present.    Will sign off at this time.  Please reconsult if a change in clinical status warrants re-evaluation of dosage.

## 2023-11-10 NOTE — Plan of Care (Signed)
   Problem: Nutrition: Goal: Adequate nutrition will be maintained Outcome: Progressing   Problem: Safety: Goal: Ability to remain free from injury will improve Outcome: Progressing   Problem: Skin Integrity: Goal: Risk for impaired skin integrity will decrease Outcome: Progressing

## 2023-11-10 NOTE — Plan of Care (Signed)
  Problem: Health Behavior/Discharge Planning: Goal: Ability to manage health-related needs will improve Outcome: Progressing   Problem: Clinical Measurements: Goal: Ability to maintain clinical measurements within normal limits will improve Outcome: Progressing   Problem: Safety: Goal: Ability to remain free from injury will improve Outcome: Progressing   

## 2023-11-11 DIAGNOSIS — E870 Hyperosmolality and hypernatremia: Secondary | ICD-10-CM | POA: Diagnosis not present

## 2023-11-11 LAB — BASIC METABOLIC PANEL WITH GFR
Anion gap: 5 (ref 5–15)
BUN: 23 mg/dL (ref 8–23)
CO2: 25 mmol/L (ref 22–32)
Calcium: 8.1 mg/dL — ABNORMAL LOW (ref 8.9–10.3)
Chloride: 116 mmol/L — ABNORMAL HIGH (ref 98–111)
Creatinine, Ser: 0.89 mg/dL (ref 0.61–1.24)
GFR, Estimated: >60 mL/min (ref >60)
Glucose, Bld: 107 mg/dL — ABNORMAL HIGH (ref 70–99)
Potassium: 3.4 mmol/L — ABNORMAL LOW (ref 3.5–5.1)
Sodium: 146 mmol/L — ABNORMAL HIGH (ref 135–145)

## 2023-11-11 MED ORDER — POTASSIUM CHLORIDE CRYS ER 20 MEQ PO TBCR
40.0000 meq | EXTENDED_RELEASE_TABLET | Freq: Once | ORAL | Status: AC
Start: 2023-11-11 — End: 2023-11-11
  Administered 2023-11-11: 40 meq via ORAL
  Filled 2023-11-11: qty 2

## 2023-11-11 NOTE — Progress Notes (Signed)
 PROGRESS NOTE  Ernest Campbell  YQM:[id number] DOB: 1951/05/29 DOA: 11/07/2023 PCP: Margot Ables, MD (Inactive)   Brief Narrative:  Patient is a 73 year old male with history of CVA, traumatic brain injury, cognitive deficits, macrocytic anemia, frequent falls, femur fracture who presented from home for further evaluation of severe dehydration, AKI, suspicion for UTI.  Previously, he was coherent, had good appetite but recently started having frequent falls, worsening delirium and has been admitted multiple times at rehab.  Report of losing weight, not interactive, not eating or drinking.  Started on antibiotic for suspected UTI about 3 to 4 days ago.  On presentation, he was hypothermic, hypotensive.  Lab work showed severe AKI with creatinine in the range of 4, hyponatremia.  Found to be severely dehydrated.  Started on antibiotic, IV fluid.  Nutritionist consulted.  Palliative care care consulted  for goals of care.  Now clinically improved .  Remains full code.  PT/OT recommending SNF on discharge.  Plan to discharge tomorrow to SNF after finishing 5 days course of IV antibiotic  Assessment & Plan:  Principal Problem:   Hypernatremia   Severe dehydration/hypernatremia/AKI/poor oral intake: Likely from poor oral intake for last several days.  Patient has been more confused at home, declining, losing weight.  Hypernatremia improved  Hypotension: Likely from severe dehydration.   Currently blood pressure stable  Suspected sepsis/UTI: Urine culture showed resistant  E. coli.  Currently on Zosyn.  He will finish course of Zosyn today.  Oral thrush: Started on fluconazole  AKI: Likely from combination of severe dehydration, UTI, hypotension.  Found to have concentrated urine. Now resolved. Renal ultrasound did not show any obstruction  Severe failure to thrive: Nutritionist consulted..Palliative care consulted for goals of care.  Patient has recurrent admission, multiple falls.  Very  poor quality of life.  History of CVA, traumatic brain injury, brain hemorrhage  on 2022,cognitive deficits.Significantly declined after Dec 2024.He was eating well until few days ago. Dietitian consulted.Speech therapy also consulted. Wife declined tube feeding/PEG, it is not his will .Started on dysphagia 1 diet.  Continues to have poor oral intake. PT/OT recommending SNF on discharge  Hypokalemia: Supplemented with potassium  Chronic normocytic anemia: Continue to monitor hemoglobin.  No evidence of acute blood loss.  Could be associated with malnutrition as well  Pressure Injury 11/07/23 Sacrum Mid Unstageable - Full thickness tissue loss in which the base of the injury is covered by slough (yellow, tan, gray, green or brown) and/or eschar (tan, brown or black) in the wound bed. large area with yellow-ish base, s (Active)  11/07/23 1845  Location: Sacrum  Location Orientation: Mid  Staging: Unstageable - Full thickness tissue loss in which the base of the injury is covered by slough (yellow, tan, gray, green or brown) and/or eschar (tan, brown or black) in the wound bed.  Wound Description (Comments): large area with yellow-ish base, slight drainage  Present on Admission: Yes     Pressure Injury 11/07/23 Buttocks Right Deep Tissue Pressure Injury - Purple or maroon localized area of discolored intact skin or blood-filled blister due to damage of underlying soft tissue from pressure and/or shear. (Active)  11/07/23 1845  Location: Buttocks  Location Orientation: Right  Staging: Deep Tissue Pressure Injury - Purple or maroon localized area of discolored intact skin or blood-filled blister due to damage of underlying soft tissue from pressure and/or shear.  Wound Description (Comments):   Present on Admission: Yes     Pressure Injury 11/07/23 Heel Right Deep Tissue  Pressure Injury - Purple or maroon localized area of discolored intact skin or blood-filled blister due to damage of underlying  soft tissue from pressure and/or shear. (Active)  11/07/23 1845  Location: Heel  Location Orientation: Right  Staging: Deep Tissue Pressure Injury - Purple or maroon localized area of discolored intact skin or blood-filled blister due to damage of underlying soft tissue from pressure and/or shear.  Wound Description (Comments):   Present on Admission: Yes     Pressure Injury 11/07/23 Heel Left Deep Tissue Pressure Injury - Purple or maroon localized area of discolored intact skin or blood-filled blister due to damage of underlying soft tissue from pressure and/or shear. (Active)  11/07/23 1905  Location: Heel  Location Orientation: Left  Staging: Deep Tissue Pressure Injury - Purple or maroon localized area of discolored intact skin or blood-filled blister due to damage of underlying soft tissue from pressure and/or shear.  Wound Description (Comments):   Present on Admission: Yes       Nutrition Problem: Severe Malnutrition Etiology: chronic illness (TBI, CVA) Pressure Injury 11/07/23 Sacrum Mid Unstageable - Full thickness tissue loss in which the base of the injury is covered by slough (yellow, tan, gray, green or brown) and/or eschar (tan, brown or black) in the wound bed. large area with yellow-ish base, s (Active)  11/07/23 1845  Location: Sacrum  Location Orientation: Mid  Staging: Unstageable - Full thickness tissue loss in which the base of the injury is covered by slough (yellow, tan, gray, green or brown) and/or eschar (tan, brown or black) in the wound bed.  Wound Description (Comments): large area with yellow-ish base, slight drainage  Present on Admission: Yes  Dressing Type Foam - Lift dressing to assess site every shift;Gauze (Comment) 11/11/23 1000     Pressure Injury 11/07/23 Buttocks Right Deep Tissue Pressure Injury - Purple or maroon localized area of discolored intact skin or blood-filled blister due to damage of underlying soft tissue from pressure and/or shear.  (Active)  11/07/23 1845  Location: Buttocks  Location Orientation: Right  Staging: Deep Tissue Pressure Injury - Purple or maroon localized area of discolored intact skin or blood-filled blister due to damage of underlying soft tissue from pressure and/or shear.  Wound Description (Comments):   Present on Admission: Yes  Dressing Type Foam - Lift dressing to assess site every shift 11/09/23 0942     Pressure Injury 11/07/23 Heel Right Deep Tissue Pressure Injury - Purple or maroon localized area of discolored intact skin or blood-filled blister due to damage of underlying soft tissue from pressure and/or shear. (Active)  11/07/23 1845  Location: Heel  Location Orientation: Right  Staging: Deep Tissue Pressure Injury - Purple or maroon localized area of discolored intact skin or blood-filled blister due to damage of underlying soft tissue from pressure and/or shear.  Wound Description (Comments):   Present on Admission: Yes  Dressing Type Gauze (Comment);Other (Comment) 11/11/23 1000     Pressure Injury 11/07/23 Heel Left Deep Tissue Pressure Injury - Purple or maroon localized area of discolored intact skin or blood-filled blister due to damage of underlying soft tissue from pressure and/or shear. (Active)  11/07/23 1905  Location: Heel  Location Orientation: Left  Staging: Deep Tissue Pressure Injury - Purple or maroon localized area of discolored intact skin or blood-filled blister due to damage of underlying soft tissue from pressure and/or shear.  Wound Description (Comments):   Present on Admission: Yes  Dressing Type Gauze (Comment);Other (Comment) 11/11/23 1000  DVT prophylaxis:heparin injection 5,000 Units Start: 11/07/23 2200     Code Status: Full Code  Family Communication: discussed with wife on phone on 4/7  Patient status:Inpatient  Patient is from :Home  Anticipated discharge to:SNF  Estimated DC date:tomorrow   Consultants: palliative  care  Procedures:None  Antimicrobials:  Anti-infectives (From admission, onward)    Start     Dose/Rate Route Frequency Ordered Stop   11/08/23 0600  piperacillin-tazobactam (ZOSYN) IVPB 3.375 g  Status:  Discontinued        3.375 g 12.5 mL/hr over 240 Minutes Intravenous Every 12 hours 11/07/23 1757 11/08/23 0247   11/08/23 0400  piperacillin-tazobactam (ZOSYN) IVPB 3.375 g        3.375 g 12.5 mL/hr over 240 Minutes Intravenous Every 8 hours 11/08/23 0247     11/07/23 2000  fluconazole (DIFLUCAN) tablet 100 mg        100 mg Oral Daily 11/07/23 1729     11/07/23 1700  piperacillin-tazobactam (ZOSYN) IVPB 3.375 g        3.375 g 100 mL/hr over 30 Minutes Intravenous  Once 11/07/23 1648 11/07/23 2045   11/07/23 1200  cefTRIAXone (ROCEPHIN) 2 g in sodium chloride 0.9 % 100 mL IVPB        2 g 200 mL/hr over 30 Minutes Intravenous Once 11/07/23 1147 11/07/23 1234       Subjective: Patient seen and examined at bedside today.  Hemodynamically stable.  Niece at bedside. He was lying on bed.  As per the niece, he ate some yesterday.  Very deconditioned and weak but overall comfortable.  Objective: Vitals:   11/10/23 0528 11/10/23 1312 11/10/23 2038 11/11/23 0353  BP: (!) 108/59 104/64 (!) 100/50 121/67  Pulse:  61 65 64  Resp: 14 18 16 19   Temp: 97.9 F (36.6 C) (!) 97.4 F (36.3 C) 98.1 F (36.7 C) 98 F (36.7 C)  TempSrc: Oral Oral Oral Oral  SpO2:  100% 98% 100%  Weight:      Height:        Intake/Output Summary (Last 24 hours) at 11/11/2023 1043 Last data filed at 11/10/2023 1811 Gross per 24 hour  Intake 290 ml  Output 250 ml  Net 40 ml   Filed Weights   11/07/23 1853  Weight: 50.8 kg    Examination:  General exam: Overall comfortable, not in distress, chronically deconditioned, malnourished HEENT: PERRL Respiratory system:  no wheezes or crackles  Cardiovascular system: S1 & S2 heard, RRR.  Gastrointestinal system: Abdomen is nondistended, soft and  nontender. Central nervous system: Alert and awake but nonverbal Extremities: No edema, no clubbing ,no cyanosis Skin: Pressure ulcers   Data Reviewed: I have personally reviewed following labs and imaging studies  CBC: Recent Labs  Lab 11/07/23 1215 11/08/23 0513 11/09/23 0536  WBC 7.0 7.5 7.9  NEUTROABS 4.9  --   --   HGB 10.6* 7.6* 9.1*  HCT 34.6* 26.6* 30.6*  MCV 105.2* 112.2* 108.1*  PLT 514* 343 362   Basic Metabolic Panel: Recent Labs  Lab 11/08/23 0513 11/08/23 0818 11/09/23 0536 11/10/23 0515 11/11/23 0511  NA 152* 153* 146* 147* 146*  K 4.3 3.9 3.9 3.4* 3.4*  CL 126* 125* 120* 115* 116*  CO2 20* 19* 21* 24 25  GLUCOSE 85 87 117* 102* 107*  BUN 84* 79* 50* 31* 23  CREATININE 1.88* 1.82* 1.17 0.87 0.89  CALCIUM 8.1* 8.1* 8.1* 8.2* 8.1*     Recent Results (from the past 240 hours)  Resp panel by RT-PCR (RSV, Flu A&B, Covid) Anterior Nasal Swab     Status: None   Collection Time: 11/07/23 11:54 AM   Specimen: Anterior Nasal Swab  Result Value Ref Range Status   SARS Coronavirus 2 by RT PCR NEGATIVE NEGATIVE Final    Comment: (NOTE) SARS-CoV-2 target nucleic acids are NOT DETECTED.  The SARS-CoV-2 RNA is generally detectable in upper respiratory specimens during the acute phase of infection. The lowest concentration of SARS-CoV-2 viral copies this assay can detect is 138 copies/mL. A negative result does not preclude SARS-Cov-2 infection and should not be used as the sole basis for treatment or other patient management decisions. A negative result may occur with  improper specimen collection/handling, submission of specimen other than nasopharyngeal swab, presence of viral mutation(s) within the areas targeted by this assay, and inadequate number of viral copies(<138 copies/mL). A negative result must be combined with clinical observations, patient history, and epidemiological information. The expected result is Negative.  Fact Sheet for Patients:   BloggerCourse.com  Fact Sheet for Healthcare Providers:  SeriousBroker.it  This test is no t yet approved or cleared by the [place] FDA and  has been authorized for detection and/or diagnosis of SARS-CoV-2 by FDA under an Emergency Use Authorization (EUA). This EUA will remain  in effect (meaning this test can be used) for the duration of the COVID-19 declaration under Section 564(b)(1) of the Act, 21 U.S.C.section 360bbb-3(b)(1), unless the authorization is terminated  or revoked sooner.       Influenza A by PCR NEGATIVE NEGATIVE Final   Influenza B by PCR NEGATIVE NEGATIVE Final    Comment: (NOTE) The Xpert Xpress SARS-CoV-2/FLU/RSV plus assay is intended as an aid in the diagnosis of influenza from Nasopharyngeal swab specimens and should not be used as a sole basis for treatment. Nasal washings and aspirates are unacceptable for Xpert Xpress SARS-CoV-2/FLU/RSV testing.  Fact Sheet for Patients: BloggerCourse.com  Fact Sheet for Healthcare Providers: SeriousBroker.it  This test is not yet approved or cleared by the [place] FDA and has been authorized for detection and/or diagnosis of SARS-CoV-2 by FDA under an Emergency Use Authorization (EUA). This EUA will remain in effect (meaning this test can be used) for the duration of the COVID-19 declaration under Section 564(b)(1) of the Act, 21 U.S.C. section 360bbb-3(b)(1), unless the authorization is terminated or revoked.     Resp Syncytial Virus by PCR NEGATIVE NEGATIVE Final    Comment: (NOTE) Fact Sheet for Patients: BloggerCourse.com  Fact Sheet for Healthcare Providers: SeriousBroker.it  This test is not yet approved or cleared by the [place] FDA and has been authorized for detection and/or diagnosis of SARS-CoV-2 by FDA under an Emergency Use  Authorization (EUA). This EUA will remain in effect (meaning this test can be used) for the duration of the COVID-19 declaration under Section 564(b)(1) of the Act, 21 U.S.C. section 360bbb-3(b)(1), unless the authorization is terminated or revoked.  Performed at [facility], [address], [place], [place] [address]   Blood Culture (routine x 2)     Status: None (Preliminary result)   Collection Time: 11/07/23 12:15 PM   Specimen: BLOOD  Result Value Ref Range Status   Specimen Description   Final    BLOOD LEFT ANTECUBITAL Performed at [facility], [address], [place], [place] [address]    Special Requests   Final    BOTTLES DRAWN AEROBIC AND ANAEROBIC Blood Culture adequate volume Performed at [facility], [address], [place], [place] [address]    Culture   Final    NO GROWTH 4 DAYS Performed at [facility], [address] [place], [place] [address]    Report Status PENDING  Incomplete  Blood Culture (routine x 2)     Status: None (Preliminary result)   Collection Time: 11/07/23 12:15 PM   Specimen: BLOOD RIGHT ARM  Result Value Ref Range Status   Specimen Description   Final    BLOOD RIGHT ARM Performed at [facility], [address], [place], [place] [address]    Special Requests   Final    BOTTLES DRAWN AEROBIC AND ANAEROBIC Blood Culture results may not be optimal due to an inadequate volume of blood received in culture bottles Performed at [facility], [address], [place], [place] [address]    Culture   Final    NO GROWTH 4 DAYS Performed at [facility], [address], [place], [place] [address]    Report Status PENDING  Incomplete  Urine Culture     Status: Abnormal   Collection Time: 11/07/23  1:35 PM   Specimen: Urine, Catheterized  Result Value Ref Range Status   Specimen Description   Final    URINE, CATHETERIZED Performed at [facility], [address], [place], [place] [address]    Special Requests   Final    NONE Reflexed from [id number] Performed at [facility], [address] [place], [place] [address]    Culture (A)  Final    60,000 COLONIES/mL ESCHERICHIA COLI Two isolates with different morphologies were identified as the same organism.The most resistant organism was reported. Performed at [facility], [address], [place], [place] [address]    Report Status 11/10/2023 FINAL  Final   Organism ID, Bacteria ESCHERICHIA COLI (A)  Final      Susceptibility   Escherichia coli - MIC*    AMPICILLIN >=32 RESISTANT Resistant     CEFAZOLIN >=64 RESISTANT Resistant     CEFEPIME <=0.12 SENSITIVE Sensitive     CEFTRIAXONE 0.5 SENSITIVE Sensitive     CIPROFLOXACIN <=0.25 SENSITIVE Sensitive     GENTAMICIN <=1 SENSITIVE Sensitive     IMIPENEM <=0.25 SENSITIVE Sensitive     NITROFURANTOIN 64 INTERMEDIATE Intermediate     TRIMETH/SULFA <=20 SENSITIVE Sensitive     AMPICILLIN/SULBACTAM >=32 RESISTANT Resistant     PIP/TAZO 8 SENSITIVE Sensitive ug/mL    * 60,000 COLONIES/mL ESCHERICHIA COLI  Urine Culture     Status: Abnormal   Collection Time: 11/08/23 11:42 AM   Specimen: Urine, Random  Result Value Ref Range Status   Specimen Description   Final    URINE, RANDOM Performed at [facility], [address] [place], [place] [address]    Special Requests   Final    NONE Reflexed from [id number] Performed at [facility], [address], [place], [place] [address]    Culture (A)  Final    <10,000 COLONIES/mL INSIGNIFICANT GROWTH Performed at [facility], [address] [place], [place] [address]    Report Status 11/10/2023 FINAL  Final     Radiology Studies: No results found.   Scheduled Meds:  aspirin EC  325 mg Oral Daily   Carbidopa-Levodopa ER  1 tablet Oral TID   Chlorhexidine Gluconate Cloth  6 each Topical Daily   diclofenac Sodium  2 g  Topical  QID   docusate sodium  100 mg Oral QPM   feeding supplement  1 Container Oral TID BM   fluconazole  100 mg Oral Daily   fluticasone  1 spray Each Nare Daily   heparin  5,000 Units Subcutaneous Q8H   leptospermum manuka honey  1 Application Topical Daily   lidocaine  1 patch Transdermal Q24H   liver oil-zinc oxide   Topical BID   memantine  5 mg Oral BID   mouth rinse  15 mL Mouth Rinse 4 times per day   Continuous Infusions:  piperacillin-tazobactam (ZOSYN)  IV 3.375 g (11/11/23 0354)     LOS: 4 days   Burnadette Pop, MD [facility] P4/02/2024, 10:43 AM

## 2023-11-11 NOTE — Progress Notes (Signed)
 Daily Progress Note   Patient Name: Ernest Campbell       Date: 11/11/2023 DOB: December 01, 1950  Age: 73 y.o. MRN#: [id number] Attending Physician: Burnadette Pop, MD Primary Care Physician: Margot Ables, MD (Inactive) Admit Date: 11/07/2023  Reason for Consultation/Follow-up: Establishing goals of care  Subjective: Currently awake alert, sitting in chair, mumbles words I cannot understand, tracks me in the room, overall, more alert than before.  Wife on the phone, niece at bedside, another family member also in the room, took in about 40-45% breakfast in total.    Length of Stay: 4  Current Medications: Scheduled Meds:   aspirin EC  325 mg Oral Daily   Carbidopa-Levodopa ER  1 tablet Oral TID   Chlorhexidine Gluconate Cloth  6 each Topical Daily   diclofenac Sodium  2 g Topical QID   docusate sodium  100 mg Oral QPM   feeding supplement  1 Container Oral TID BM   fluconazole  100 mg Oral Daily   fluticasone  1 spray Each Nare Daily   heparin  5,000 Units Subcutaneous Q8H   leptospermum manuka honey  1 Application Topical Daily   lidocaine  1 patch Transdermal Q24H   liver oil-zinc oxide   Topical BID   memantine  5 mg Oral BID   mouth rinse  15 mL Mouth Rinse 4 times per day    Continuous Infusions:  piperacillin-tazobactam (ZOSYN)  IV 3.375 g (11/11/23 0354)    PRN Meds: acetaminophen **OR** acetaminophen, albuterol, ondansetron **OR** ondansetron (ZOFRAN) IV, mouth rinse, polyethylene glycol  Physical Exam         Awake alert Appears chronically ill Regular work of breathing Thinly built  Vital Signs: BP 121/67 (BP Location: Right Arm)   Pulse 64   Temp 98 F (36.7 C) (Oral)   Resp 19   Ht 5\' 5"  (1.651 m)   Wt 50.8 kg   SpO2 100%   BMI 18.64 kg/m  SpO2:  SpO2: 100 % O2 Device: O2 Device: Room Air O2 Flow Rate: O2 Flow Rate (L/min): 2 L/min  Intake/output summary:  Intake/Output Summary (Last 24 hours) at 11/11/2023 1100 Last data filed at 11/10/2023 1811 Gross per 24 hour  Intake 290 ml  Output 250 ml  Net 40 ml   LBM: Last BM Date : 11/10/23 Baseline  Weight: Weight: 50.8 kg Most recent weight: Weight: 50.8 kg       Palliative Assessment/Data:      Patient Active Problem List   Diagnosis Date Noted   Hypernatremia 11/07/2023   Femur fracture (HCC) 10/24/2023   Protein-calorie malnutrition, severe 09/29/2023   Closed right hip fracture, initial encounter (HCC) 09/25/2023   Cervical spondylosis 09/25/2023   Paraparesis (HCC) 09/25/2023   Macrocytic anemia 09/25/2023   Hypocalcemia 09/25/2023   Altered mental status 07/21/2023   CVA (cerebral vascular accident) (HCC) 07/18/2023   Shuffling gait 07/03/2021   Episodes of formed visual hallucinations 07/03/2021   Nocturnal enuresis 07/03/2021   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 Patient admitted with severe dehydration acute kidney injury suspicion for urinary tract infection. Patient admitted to hospital medicine service started on antibiotics antifungal and IV fluids, antibiotics and speech therapy also consulted for p.o. diet recommendations. Palliative consult for CODE STATUS about goals of care discussions.  Assessment:  Severe malnutrition related to chronic illness TBI CVA Recent UTI Frequent falls, recent femur fracture.   Recommendations/Plan: Discussed with the patient's wife on the  phone, niece also present at bedside.  They wish to continue current mode of care.  Full code.  For now, wife requests continuation of IV antibiotics, she wishes to get his dentures and wants to continue efforts to improve patient's PO intake. Full code, full scope, recommend outpatient palliative care.   Code Status:    Code Status Orders  (From admission, onward)           Start     Ordered   11/07/23 1645  Full code  Continuous       Question:  By:  Answer:  Consent: discussion documented in EHR   11/07/23 1646           Code Status History     Date Active Date Inactive Code Status Order ID Comments User Context   11/07/2023 1603 11/07/2023 1646 Full Code [id number]  Delos Haring ED   10/24/2023 5284 10/29/2023 2035 Full Code [id number]  Eduard Clos, MD ED   10/24/2023 [phone] 10/24/2023 [id number] Limited: Do not attempt resuscitation (DNR) -DNR-LIMITED -Do Not Intubate/DNI  [id number]  Eduard Clos, MD ED   09/25/2023 1419 09/30/2023 1824 Full Code [id number]  Bobette Mo, MD ED   09/25/2023 1419 09/25/2023 1419 Full Code [id number]  Bobette Mo, MD ED   07/19/2023 0019 07/21/2023 2025 Full Code [id number]  Buena Irish, MD ED      Advance Directive Documentation    Flowsheet Row Most Recent Value  Type of Advance Directive Living will, Healthcare Power of Attorney  Pre-existing out of facility DNR order (yellow form or pink MOST form) --  "MOST" Form in Place? --       Prognosis:  Unable to determine  Discharge Planning: Recommend back to [facility] with palliative on discharge.   Care plan was discussed with  wife niece  Thank you for allowing the Palliative Medicine Team to assist in the care of this patient. Mod MDM.      Greater than 50%  of this time  was spent counseling and coordinating care related to the above assessment and plan.  Rosalin Hawking, MD  Please contact Palliative Medicine Team phone at [phone] for questions  and concerns.

## 2023-11-11 NOTE — [place] FL2 (Signed)
 [place] MEDICAID FL2 LEVEL OF CARE FORM     IDENTIFICATION  Patient Name: Ernest Campbell Birthdate: 1951-01-25 Sex: male Admission Date (Current Location): 11/07/2023  [facility] and [place] Number:  Producer, television/film/video and Address:  [facility],  [address] [place], [place] [address]      Provider Number: [id number]  Attending Physician Name and Address:  Burnadette Pop, MD  Relative Name and Phone Number:  Marten Iles (spouse) Ph: [phone]    Current Level of Care: Hospital Recommended Level of Care: Skilled Nursing Facility Prior Approval Number:    Date Approved/Denied:   PASRR Number: [id number]  Discharge Plan: SNF    Current Diagnoses: Patient Active Problem List   Diagnosis Date Noted   Hypernatremia 11/07/2023   Femur fracture (HCC) 10/24/2023   Protein-calorie malnutrition, severe 09/29/2023   Closed right hip fracture, initial encounter (HCC) 09/25/2023   Cervical spondylosis 09/25/2023   Paraparesis (HCC) 09/25/2023   Macrocytic anemia 09/25/2023   Hypocalcemia 09/25/2023   Altered mental status 07/21/2023   CVA (cerebral vascular accident) (HCC) 07/18/2023   Shuffling gait 07/03/2021   Episodes of formed visual hallucinations 07/03/2021   Nocturnal enuresis 07/03/2021   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   TBI (traumatic brain injury) (HCC) 07/03/2021   History of short term memory loss 07/03/2021    Orientation RESPIRATION BLADDER Height & Weight     Self  Normal Incontinent Weight: 111 lb 15.9 oz (50.8 kg) Height:  5\' 5"  (165.1 cm)  BEHAVIORAL SYMPTOMS/MOOD NEUROLOGICAL BOWEL NUTRITION STATUS      Incontinent Diet (Dysphagia 1 diet)  AMBULATORY STATUS COMMUNICATION OF NEEDS Skin   Total Care Verbally PU Stage and Appropriate Care, Other (Comment) (Erythema: buttocks; Ecchymosis: bilateral arms & legs)       PU Stage 4 Dressing: Daily (Cleanse sacral wound w/Vashe  wound cleanser Hart [place] [phone]), do not rinse & allow to air dry. Apply Medihoney to wound daily, cover with dry gauze. Coat surrounding skin w/thin layer of Desitin. Secure area w/ABD pads & tape or silicone foam.)               Personal Care Assistance Level of Assistance  Bathing, Dressing, Feeding Bathing Assistance: Maximum assistance Feeding assistance: Maximum assistance Dressing Assistance: Maximum assistance Total Care Assistance: Maximum assistance   Functional Limitations Info  Sight, Hearing, Speech Sight Info: Impaired Hearing Info: Impaired Speech Info: Impaired    SPECIAL CARE FACTORS FREQUENCY  PT (By licensed PT), OT (By licensed OT)     PT Frequency: 5x's/week OT Frequency: 5x's/week            Contractures Contractures Info: Not present    Additional Factors Info  Code Status, Allergies Code Status Info: Full Allergies Info: Fentanyl And Related, Other, Prednisone, Codeine, Morphine, Oxycodone           Current Medications (11/11/2023):  This is the current hospital active medication list Current Facility-Administered Medications  Medication Dose Route Frequency Provider Last Rate Last Admin   acetaminophen (TYLENOL) tablet 650 mg  650 mg Oral Q6H PRN Kirby Crigler, Mir M, MD   650 mg at 11/11/23 8756   Or   acetaminophen (TYLENOL) suppository 650 mg  650 mg Rectal Q6H PRN Kirby Crigler, Mir M, MD       albuterol (PROVENTIL) (2.5 MG/3ML) 0.083% nebulizer solution 2.5 mg  2.5 mg Nebulization Q2H PRN Maryln Gottron, MD       aspirin EC tablet 325  mg  325 mg Oral Daily Kirby Crigler, Mir M, MD   325 mg at 11/11/23 2952   Carbidopa-Levodopa ER (SINEMET CR) 25-100 MG tablet controlled release 1 tablet  1 tablet Oral TID Maryln Gottron, MD   1 tablet at 11/11/23 0944   Chlorhexidine Gluconate Cloth 2 % PADS 6 each  6 each Topical Daily Burnadette Pop, MD   6 each at 11/11/23 8413   diclofenac Sodium (VOLTAREN) 1 % topical gel 2 g  2 g Topical QID Rosalin Hawking,  MD   2 g at 11/11/23 2440   docusate sodium (COLACE) capsule 100 mg  100 mg Oral QPM Kirby Crigler, Mir M, MD   100 mg at 11/10/23 1802   feeding supplement (BOOST / RESOURCE BREEZE) liquid 1 Container  1 Container Oral TID BM Burnadette Pop, MD   1 Container at 11/11/23 0953   fluconazole (DIFLUCAN) tablet 100 mg  100 mg Oral Daily Burnadette Pop, MD   100 mg at 11/11/23 0943   fluticasone (FLONASE) 50 MCG/ACT nasal spray 1 spray  1 spray Each Nare Daily Kirby Crigler, Mir M, MD   1 spray at 11/11/23 0953   heparin injection 5,000 Units  5,000 Units Subcutaneous Q8H Kirby Crigler, Mir M, MD   5,000 Units at 11/11/23 0548   leptospermum manuka honey (MEDIHONEY) paste 1 Application  1 Application Topical Daily Kirby Crigler, Mir M, MD   1 Application at 11/11/23 0955   lidocaine (LIDODERM) 5 % 1 patch  1 patch Transdermal Q24H Rosalin Hawking, MD   1 patch at 11/10/23 1331   liver oil-zinc oxide (DESITIN) 40 % ointment   Topical BID Maryln Gottron, MD   Given at 11/11/23 0955   memantine (NAMENDA) tablet 5 mg  5 mg Oral BID Kirby Crigler, Mir M, MD   5 mg at 11/11/23 0943   ondansetron (ZOFRAN) tablet 4 mg  4 mg Oral Q6H PRN Kirby Crigler, Mir M, MD       Or   ondansetron [facility]) injection 4 mg  4 mg Intravenous Q6H PRN Maryln Gottron, MD       Oral care mouth rinse  15 mL Mouth Rinse 4 times per day Burnadette Pop, MD   15 mL at 11/11/23 0955   Oral care mouth rinse  15 mL Mouth Rinse PRN Burnadette Pop, MD       piperacillin-tazobactam (ZOSYN) IVPB 3.375 g  3.375 g Intravenous Q8H Adhikari, Amrit, MD 12.5 mL/hr at 11/11/23 0354 3.375 g at 11/11/23 0354   polyethylene glycol (MIRALAX / GLYCOLAX) packet 17 g  17 g Oral Daily PRN Maryln Gottron, MD         Discharge Medications: Please see discharge summary for a list of discharge medications.  Relevant Imaging Results:  Relevant Lab Results:   Additional Information SSN: [id number]  Ewing Schlein, LCSW

## 2023-11-11 NOTE — TOC Progression Note (Signed)
 Transition of Care [facility]) - Progression Note   Patient Details  Name: Ernest Campbell MRN: [id number] Date of Birth: 04-Oct-1950  [facility]) CM/SW Contact  Ewing Schlein, LCSW Phone Number: 11/11/2023, 12:32 PM  Clinical Narrative: CSW spoke with patient's wife, Rashid Whitenight, regarding patient returning to [facility] for rehab. Wife expressed concerns about patient discharging on PO antibiotics and is requesting that patient finish IV antibiotics in the hospital prior to discharging to SNF. Wife had questions regarding care at [facility] when the patient returns. CSW encouraged wife to follow up with the facility directly with questions about the facility. CSW updated hospitalist regarding wife's concerns about PO antibiotics. Patient to finish IV antibiotics and then discharge to SNF tomorrow. CSW updated Charlcie Cradle at [facility]. FL2 completed. TOC to follow.  Expected Discharge Plan: Skilled Nursing Facility Barriers to Discharge: Continued Medical Work up  Expected Discharge Plan and Services In-house Referral: Clinical Social Work Post Acute Care Choice: Skilled Nursing Facility Living arrangements for the past 2 months: Single Family Home             DME Arranged: N/A DME Agency: NA  Social Determinants of Health (SDOH) Interventions SDOH Screenings   Food Insecurity: No Food Insecurity (11/07/2023)  Housing: Unknown (11/07/2023)  Transportation Needs: No Transportation Needs (11/07/2023)  Utilities: Not At Risk (11/07/2023)  Financial Resource Strain: Low Risk  (10/20/2022)   Received from [facility], [facility]  Physical Activity: Unknown (10/20/2022)   Received from [facility], [facility]  Social Connections: Unknown (11/07/2023)  Recent Concern: Social Connections - Moderately Isolated (10/24/2023)  Stress: Patient Declined (10/20/2022)   Received from [facility], [facility]  Tobacco Use: High Risk (11/07/2023)   Readmission Risk Interventions     11/08/2023    1:14 PM  Readmission Risk Prevention Plan  Transportation Screening Complete  Medication Review (RN Care Manager) Complete  PCP or Specialist appointment within 3-5 days of discharge Not Complete  PCP/Specialist Appt Not Complete comments Patient will return to rehab at discharge.  HRI or Home Care Consult Complete  SW Recovery Care/Counseling Consult Complete  Palliative Care Screening Not Applicable  Skilled Nursing Facility Complete

## 2023-11-11 NOTE — Progress Notes (Signed)
 SLP Cancellation Note  Patient Details Name: Ernest Campbell MRN: [id number] DOB: [birth year]   Cancelled treatment:       Reason Eval/Treat Not Completed: Other (comment) SLP presented to room to attempt to place patient's dentures for advanced PO trials. Unfortunately, despite SLP attempt and family member attempt (with patient's spouse on speaker phone trying to cue him), patient refused all attempts and would close lips tightly and turn head away. Spouse plans to come later today and will attempt to place patient's dentures.  Angela Nevin, MA, CCC-SLP Speech Therapy

## 2023-11-12 DIAGNOSIS — N179 Acute kidney failure, unspecified: Secondary | ICD-10-CM

## 2023-11-12 DIAGNOSIS — Z515 Encounter for palliative care: Secondary | ICD-10-CM | POA: Diagnosis not present

## 2023-11-12 DIAGNOSIS — Z7189 Other specified counseling: Secondary | ICD-10-CM | POA: Diagnosis not present

## 2023-11-12 DIAGNOSIS — E870 Hyperosmolality and hypernatremia: Secondary | ICD-10-CM | POA: Diagnosis not present

## 2023-11-12 DIAGNOSIS — E860 Dehydration: Secondary | ICD-10-CM | POA: Diagnosis not present

## 2023-11-12 DIAGNOSIS — R627 Adult failure to thrive: Secondary | ICD-10-CM

## 2023-11-12 LAB — CULTURE, BLOOD (ROUTINE X 2)
Culture: NO GROWTH
Culture: NO GROWTH
Special Requests: ADEQUATE

## 2023-11-12 MED ORDER — IBUPROFEN 200 MG PO TABS: 200.0000 mg | ORAL_TABLET | Freq: Two times a day (BID) | ORAL | 0 refills | Status: DC | PRN

## 2023-11-12 MED ORDER — FLUCONAZOLE 100 MG PO TABS: 100.0000 mg | ORAL_TABLET | Freq: Every day | ORAL | Status: AC

## 2023-11-12 NOTE — Progress Notes (Signed)
  Daily Progress Note   Patient Name: Ernest Campbell       Date: 11/12/2023 DOB: [birth year]  Age: 73 y.o. MRN#: [id number] Attending Physician: Burnadette Pop, MD Primary Care Physician: Margot Ables, MD (Inactive) Admit Date: 11/07/2023 Length of Stay: 5 days  Reason for Consultation/Follow-up: Establishing goals of care  Subjective:   Reviewed EMR prior to presenting to bedside.  Patient to be discharged to rehab facility today. Placed TOC consult for outpatient palliative medicine referral.  Presented to bedside to see patient.  Patient sleeping comfortably in the bed.  Patient's niece present at bedside.  He was introduced myself to members of the palliative medicine team.  Discussed plan for patient to go to rehab today.  Niece noted that she was able to bring patient in some eggs this morning and he did eat half of them as well as drink most of an Ensure.  Niece notes that patient looks improved at this time from the way he looked last week.  Family encouraged by patient's improved oral intake.  Niece noted TOC had informed of outpatient palliative medicine referral.  Discussed importance of continuing conversations moving forward.  Spent time providing emotional support for active listening.  All questions answered at that time.  Objective:   Vital Signs:  BP (!) 116/59 (BP Location: Right Arm)   Pulse 62   Temp 98.1 F (36.7 C)   Resp 14   Ht 5\' 5"  (1.651 m)   Wt 50.8 kg   SpO2 100%   BMI 18.64 kg/m   Physical Exam: General: Sleeping comfortably, chronically ill-appearing, frail Cardiovascular: RRR Respiratory: no increased work of breathing noted, not in respiratory distress  Imaging:  I personally reviewed recent imaging.   Assessment & Plan:   Assessment: Patient is a 73 year old male with a past medical history of traumatic brain injury and stroke, macrocytic anemia, frequent falls, and recent femur fracture who was admitted on 11/07/2023 for management of  severe dehydration and acute kidney injury with concerns for urinary tract infection.  Patient received medical management for AKI and dehydration in setting of UTI.  Wife declined PEG tube placement as per patient's previously stated wishes.  Palliative medicine team consulted to assist with complex medical decision making.  Recommendations/Plan: # Complex medical decision making/goals of care:  - Patient's wife and niece had expressed desire to continue with current medical management and for patient to remain full code.  - TOC consult placed today for outpatient palliative medicine referral.  -  Code Status: Full Code  # Psychosocial Support:  - Wife, niece  # Discharge Planning: Skilled Nursing Facility for rehab with Palliative care service follow-up   Thank you for allowing the palliative care team to participate in the care Ernest Campbell.  Ernest Morin, DO Palliative Care Provider PMT # [phone]  If patient remains symptomatic despite maximum doses, please call PMT at [phone] between 0700 and 1900. Outside of these hours, please call attending, as PMT does not have night coverage.

## 2023-11-12 NOTE — Discharge Summary (Signed)
 Physician Discharge Summary  Ernest Campbell YQM:[id number] DOB: 06-25-1951 DOA: 11/07/2023  PCP: Margot Ables, MD (Inactive)  Admit date: 11/07/2023 Discharge date: 11/12/2023  Admitted From: Home Disposition:  Home  Discharge Condition:Stable CODE STATUS:FULL Diet recommendation: Dysphagia 1   Brief/Interim Summary: Patient is a 73 year old male with history of CVA, traumatic brain injury, cognitive deficits, macrocytic anemia, frequent falls, femur fracture who presented from home for further evaluation of severe dehydration, AKI, suspicion for UTI.  Previously, he was coherent, had good appetite but recently started having frequent falls, worsening delirium and has been admitted multiple times at rehab.  Report of losing weight, not interactive, not eating or drinking.  Started on antibiotic for suspected UTI about 3 to 4 days ago.  On presentation, he was hypothermic, hypotensive.  Lab work showed severe AKI with creatinine in the range of 4, hyponatremia.  Found to be severely dehydrated.  Started on antibiotic, IV fluid.  Nutritionist consulted.  Palliative care care consulted  for goals of care.  Now clinically improved .  Remains full code.  PT/OT recommending SNF on discharge.  He finished his antibiotics course.   Following problems were addressed during the hospitalization:  Severe dehydration/hypernatremia/AKI/poor oral intake: Likely from poor oral intake for last several days.  Patient had been more confused at home, declining, losing weight.  Hypernatremia improved.  Currently mentation at baseline.   Hypotension: Likely from severe dehydration.   Currently blood pressure stable   Suspected sepsis/UTI: Urine culture showed resistant  E. coli.  Currently on Zosyn.  He finished course of Zosyn   Oral thrush: Started on fluconazole   AKI: Likely from combination of severe dehydration, UTI, hypotension.  Found to have concentrated urine. Now resolved. Renal ultrasound did  not show any obstruction   Severe failure to thrive: Nutritionist consulted..Palliative care consulted for goals of care.  Patient has recurrent admission, multiple falls.  Very poor quality of life.  History of CVA, traumatic brain injury, brain hemorrhage  on 2022,cognitive deficits.Significantly declined after Dec 2024.He was eating well until few days ago. Dietitian consulted.Speech therapy also consulted. Wife declined tube feeding/PEG, it is not his will .Started on dysphagia 1 diet.  CPT/OT recommending SNF on discharge   Hypokalemia: Supplemented with potassium   Chronic normocytic anemia: Continue to monitor hemoglobin.  No evidence of acute blood loss.  Could be associated with malnutrition as well  Pressure ulcers: Continue wound care    Discharge Diagnoses:  Principal Problem:   Hypernatremia    Discharge Instructions  Discharge Instructions     Diet - low sodium heart healthy   Complete by: As directed    Discharge instructions   Complete by: As directed    1)Please take your medications as instructed 2)Follow up with speech therapy as outpatient   Discharge wound care:   Complete by: As directed    As per wound care nurse   Increase activity slowly   Complete by: As directed       Allergies as of 11/12/2023       Reactions   Fentanyl And Related Anaphylaxis   Other Other (See Comments)   According to the patient, all opioids cause hallucinations    Prednisone Other (See Comments)   Hallucinations - "Allergic," per [facility]   Codeine Other (See Comments)   Hallucinations - "Allergic," per [facility]   Morphine Other (See Comments)   Hallucinations - "Allergic," per [facility]   Oxycodone Other (See Comments)   Hallucinations - "Allergic," per [facility]  Medication List     STOP taking these medications    cefTRIAXone 1 g injection Commonly known as: ROCEPHIN   nystatin 100000 UNIT/ML suspension Commonly known as: MYCOSTATIN   QUEtiapine 25 MG tablet Commonly  known as: SEROQUEL       TAKE these medications    acetaminophen 650 MG suppository Commonly known as: TYLENOL Place 650 mg rectally every 6 (six) hours as needed for fever (or pain).   aspirin EC 325 MG tablet Take 325 mg by mouth daily. What changed: Another medication with the same name was removed. Continue taking this medication, and follow the directions you see here.   Carbidopa-Levodopa ER 25-100 MG tablet controlled release Commonly known as: SINEMET CR Take 1 tablet by mouth See admin instructions. Take 1 tablet by mouth at 9 AM, 3 PM, and 9 PM What changed: Another medication with the same name was removed. Continue taking this medication, and follow the directions you see here.   docusate sodium 100 MG capsule Commonly known as: COLACE Take 1 capsule (100 mg total) by mouth daily. What changed: when to take this   NUTRITIONAL SUPPLEMENTS PO Take 120 mLs by mouth See admin instructions. MedPass: Drink 120 ml's by mouth at 9 AM, 3 PM, and 9 PM   feeding supplement Liqd Take 237 mLs by mouth 2 (two) times daily between meals.   fluconazole 100 MG tablet Commonly known as: DIFLUCAN Take 1 tablet (100 mg total) by mouth daily for 2 days.   fluticasone 50 MCG/ACT nasal spray Commonly known as: FLONASE Place 1 spray into both nostrils daily.   ibuprofen 200 MG tablet Commonly known as: ADVIL Take 1 tablet (200 mg total) by mouth every 12 (twelve) hours as needed. What changed:  when to take this reasons to take this   lidocaine 4 % Place 1 patch onto the skin See admin instructions. Apply 1 new patch to the lower back at 10 AM and remove at 10 PM   memantine 5 MG tablet Commonly known as: Namenda Take 1 tablet (5 mg total) by mouth 2 (two) times daily.   multivitamin with minerals Tabs tablet Take 1 tablet by mouth daily.   polyethylene glycol 17 g packet Commonly known as: MIRALAX / GLYCOLAX Take 17 g by mouth daily as needed for mild constipation.                Discharge Care Instructions  (From admission, onward)           Start     Ordered   11/12/23 0000  Discharge wound care:       Comments: As per wound care nurse   11/12/23 1036            Contact information for follow-up providers     Margot Ables, MD .   Specialty: Family Medicine Contact information: [address] [place] [address] [phone]              Contact information for after-discharge care     Destination     [facility] .   Service: Skilled Nursing Contact information: [address] [place] [address] [phone]                    Allergies  Allergen Reactions   Fentanyl And Related Anaphylaxis   Other Other (See Comments)    According to the patient, all opioids cause hallucinations    Prednisone Other (See Comments)  Hallucinations - "Allergic," per [facility]   Codeine Other (See Comments)    Hallucinations - "Allergic," per [facility]   Morphine Other (See Comments)    Hallucinations - "Allergic," per [facility]   Oxycodone Other (See Comments)    Hallucinations - "Allergic," per [facility]    Consultations: Palliative care   Procedures/Studies: US RENAL Result Date: 11/07/2023 CLINICAL DATA:  Acute renal insufficiency EXAM: RENAL / URINARY TRACT ULTRASOUND COMPLETE COMPARISON:  None Available. FINDINGS: Right Kidney: Renal measurements: 8.3 x 4.8 x 3.3 cm = volume: 69.8 mL. Echogenicity within normal limits. No mass or hydronephrosis visualized. Left Kidney: Renal measurements: 7.9 x 4.8 x 3.9 cm = volume: 76.7 mL. Echogenicity within normal limits. No mass or hydronephrosis visualized. Bladder: Bladder is decompressed with a Foley catheter. Other: None. IMPRESSION: 1. Unremarkable renal ultrasound. Electronically Signed   By: Sharlet Salina M.D.   On: 11/07/2023 17:36   CT Head Wo Contrast Result Date: 11/07/2023 CLINICAL DATA:  Mental status change EXAM: CT HEAD WITHOUT  CONTRAST TECHNIQUE: Contiguous axial images were obtained from the base of the skull through the vertex without intravenous contrast. RADIATION DOSE REDUCTION: This exam was performed according to the departmental dose-optimization program which includes automated exposure control, adjustment of the mA and/or kV according to patient size and/or use of iterative reconstruction technique. COMPARISON:  Head CT 10/24/2023 FINDINGS: Brain: No evidence of acute infarction, hemorrhage, hydrocephalus, extra-axial collection or mass lesion/mass effect. There is mild periventricular white matter hypodensity, likely chronic small vessel ischemic change. Vascular: No hyperdense vessel or unexpected calcification. Skull: Normal. Negative for fracture or focal lesion. Sinuses/Orbits: There is stable complete opacification of the right maxillary sinus with some central hyperdense material. Mastoid air cells are clear. Orbits are within normal limits. Other: None. IMPRESSION: 1. No acute intracranial process. 2. Mild chronic small vessel ischemic change. 3. Stable complete opacification of the right maxillary sinus with some central hyperdense material, possibly inspissated secretions or fungal colonization. Electronically Signed   By: Darliss Cheney M.D.   On: 11/07/2023 15:29   DG Chest Port 1 View Result Date: 11/07/2023 CLINICAL DATA:  Sepsis. EXAM: PORTABLE CHEST 1 VIEW COMPARISON:  October 06, 2023. FINDINGS: The heart size and mediastinal contours are within normal limits. Both lungs are clear. The visualized skeletal structures are unremarkable. IMPRESSION: No active disease. Electronically Signed   By: Lupita Raider M.D.   On: 11/07/2023 13:40   DG FEMUR, MIN 2 VIEWS RIGHT Result Date: 10/24/2023 CLINICAL DATA:  Elective surgery. EXAM: RIGHT FEMUR 2 VIEWS COMPARISON:  Preoperative imaging FINDINGS: 11 fluoroscopic spot views of the right femur submitted from the operating room. Lateral plate and screw fixation as well  as cerclage wires traverse femoral shaft fracture. Previous proximal femoral hardware. Fluoroscopy time 56 seconds. Dose 4.82 mGy. IMPRESSION: Intraoperative fluoroscopy during femoral shaft fracture fixation. Electronically Signed   By: Narda Rutherford M.D.   On: 10/24/2023 15:55   DG FEMUR PORT, MIN 2 VIEWS RIGHT Result Date: 10/24/2023 CLINICAL DATA:  Fracture, postop. EXAM: RIGHT FEMUR PORTABLE 2 VIEW COMPARISON:  Preoperative imaging earlier today FINDINGS: Lateral plate and screw fixation is well as cerclage wires traversing femoral shaft fracture. Previous right proximal femoral hardware remains in place. Recent postsurgical change includes air and edema in the soft tissues. IMPRESSION: ORIF of right femoral shaft fracture. Electronically Signed   By: Narda Rutherford M.D.   On: 10/24/2023 15:53   DG C-Arm 1-60 Min-No Report Result Date: 10/24/2023 Fluoroscopy was utilized by the  requesting physician.  No radiographic interpretation.   DG C-Arm 1-60 Min-No Report Result Date: 10/24/2023 Fluoroscopy was utilized by the requesting physician.  No radiographic interpretation.   CT Head Wo Contrast Result Date: 10/24/2023 CLINICAL DATA:  Unwitnessed fall, found on floor at his skilled nursing facility, Unknown head trauma, probable neck trauma. EXAM: CT HEAD WITHOUT CONTRAST CT CERVICAL SPINE WITHOUT CONTRAST TECHNIQUE: Multidetector CT imaging of the head and cervical spine was performed following the standard protocol without intravenous contrast. Multiplanar CT image reconstructions of the cervical spine were also generated. RADIATION DOSE REDUCTION: This exam was performed according to the departmental dose-optimization program which includes automated exposure control, adjustment of the mA and/or kV according to patient size and/or use of iterative reconstruction technique. COMPARISON:  Cervical spine CT 10/06/2023, CT head 10/06/2023 FINDINGS: CT HEAD FINDINGS Brain: There is mild-to-moderate  cerebral atrophy, small vessel disease and atrophic ventriculomegaly, without midline shift. No cortical based acute infarct, hemorrhage or mass effect is seen. Basal cisterns are clear. Vascular: No hyperdense vessel or unexpected calcification. Skull: Negative for fractures or focal lesions. Sinuses/Orbits: Chronic opacification right maxillary sinus. Other sinuses and mastoid air cells are clear. Negative visualized orbits. There has been a previous right inferior nasal turbinectomy. Other: None. CT CERVICAL SPINE FINDINGS Alignment: Minimal grade 1 degenerative anterolisthesis again is noted at C4-5, C7-T1 and T1-2. No traumatic or further listhesis is seen with narrowing and osteophytes of the anterior atlantodental jointagain noted. Skull base and vertebrae: No acute fracture. No primary bone lesion or focal pathologic process. Mild osteopenia. Soft tissues and spinal canal: No prevertebral fluid or swelling. No visible canal hematoma. Disc levels: There is mild disc space loss at C4-5 and C7-T1, more advanced disc space loss with bidirectional endplate osteophytes at C5-6 and C6-7. The other discs are normal in heights. There are no herniated discs or cord compromise. There is multilevel left greater than right facet joint hypertrophy, and uncinate spurring. Acquired foraminal stenosis is mild on the left at C3-4, severe on the left C4-5, bilaterally moderate C5-6, moderate to severe on the right and moderate on the left C6-7. The other foramina are clear. Upper chest: There are mild paraseptal emphysematous changes in both lung apices. Otherwise negative. Other: None. IMPRESSION: 1. No acute intracranial CT findings or depressed skull fractures. 2. Chronic changes. 3. Osteopenia and degenerative change without evidence of cervical fractures. 4. Minimal grade 1 degenerative anterolisthesis C4-5, C7-T1 and T1-2. 5. Emphysema. Electronically Signed   By: Almira Bar M.D.   On: 10/24/2023 07:44   CT  Cervical Spine Wo Contrast Result Date: 10/24/2023 CLINICAL DATA:  Unwitnessed fall, found on floor at his skilled nursing facility, Unknown head trauma, probable neck trauma. EXAM: CT HEAD WITHOUT CONTRAST CT CERVICAL SPINE WITHOUT CONTRAST TECHNIQUE: Multidetector CT imaging of the head and cervical spine was performed following the standard protocol without intravenous contrast. Multiplanar CT image reconstructions of the cervical spine were also generated. RADIATION DOSE REDUCTION: This exam was performed according to the departmental dose-optimization program which includes automated exposure control, adjustment of the mA and/or kV according to patient size and/or use of iterative reconstruction technique. COMPARISON:  Cervical spine CT 10/06/2023, CT head 10/06/2023 FINDINGS: CT HEAD FINDINGS Brain: There is mild-to-moderate cerebral atrophy, small vessel disease and atrophic ventriculomegaly, without midline shift. No cortical based acute infarct, hemorrhage or mass effect is seen. Basal cisterns are clear. Vascular: No hyperdense vessel or unexpected calcification. Skull: Negative for fractures or focal lesions. Sinuses/Orbits: Chronic opacification right  maxillary sinus. Other sinuses and mastoid air cells are clear. Negative visualized orbits. There has been a previous right inferior nasal turbinectomy. Other: None. CT CERVICAL SPINE FINDINGS Alignment: Minimal grade 1 degenerative anterolisthesis again is noted at C4-5, C7-T1 and T1-2. No traumatic or further listhesis is seen with narrowing and osteophytes of the anterior atlantodental jointagain noted. Skull base and vertebrae: No acute fracture. No primary bone lesion or focal pathologic process. Mild osteopenia. Soft tissues and spinal canal: No prevertebral fluid or swelling. No visible canal hematoma. Disc levels: There is mild disc space loss at C4-5 and C7-T1, more advanced disc space loss with bidirectional endplate osteophytes at C5-6 and C6-7.  The other discs are normal in heights. There are no herniated discs or cord compromise. There is multilevel left greater than right facet joint hypertrophy, and uncinate spurring. Acquired foraminal stenosis is mild on the left at C3-4, severe on the left C4-5, bilaterally moderate C5-6, moderate to severe on the right and moderate on the left C6-7. The other foramina are clear. Upper chest: There are mild paraseptal emphysematous changes in both lung apices. Otherwise negative. Other: None. IMPRESSION: 1. No acute intracranial CT findings or depressed skull fractures. 2. Chronic changes. 3. Osteopenia and degenerative change without evidence of cervical fractures. 4. Minimal grade 1 degenerative anterolisthesis C4-5, C7-T1 and T1-2. 5. Emphysema. Electronically Signed   By: Almira Bar M.D.   On: 10/24/2023 07:44   DG Hip Unilat W or Wo Pelvis 2-3 Views Right Result Date: 10/24/2023 CLINICAL DATA:  Found on floor at home. Nonverbal patient. Deformity in the leg. EXAM: DG HIP (WITH OR WITHOUT PELVIS) 2-3V RIGHT; RIGHT FEMUR 2 VIEWS COMPARISON:  Radiographs 10/06/2023 FINDINGS: Acute oblique fracture of the mid right femur. Apex anterior/lateral angulation. Approximately 10 cm of foreshortening. Intramedullary rod and screw fixation across a healing right intertrochanteric fracture. No radiographic evidence of hardware loosening. The fracture line remains visible. IMPRESSION: Acute oblique displaced, angulated, foreshortened fracture of the mid right femur. Electronically Signed   By: Minerva Fester M.D.   On: 10/24/2023 03:03   DG FEMUR, MIN 2 VIEWS RIGHT Result Date: 10/24/2023 CLINICAL DATA:  Found on floor at home. Nonverbal patient. Deformity in the leg. EXAM: DG HIP (WITH OR WITHOUT PELVIS) 2-3V RIGHT; RIGHT FEMUR 2 VIEWS COMPARISON:  Radiographs 10/06/2023 FINDINGS: Acute oblique fracture of the mid right femur. Apex anterior/lateral angulation. Approximately 10 cm of foreshortening. Intramedullary  rod and screw fixation across a healing right intertrochanteric fracture. No radiographic evidence of hardware loosening. The fracture line remains visible. IMPRESSION: Acute oblique displaced, angulated, foreshortened fracture of the mid right femur. Electronically Signed   By: Minerva Fester M.D.   On: 10/24/2023 03:03      Subjective: Patient seen and examined at bedside today.  Hemodynamically stable.  Comfortable lying in bed.  Not in acute distress.  He ate some food this morning.  Tolerating dysphagia 1 diet.  His niece was at the bedside.  Medically stable for discharge to SNF today  Discharge Exam: Vitals:   11/11/23 2011 11/12/23 0513  BP: 129/69 (!) 116/59  Pulse: 75 62  Resp: 14 14  Temp: 97.7 F (36.5 C) 98.1 F (36.7 C)  SpO2: 100% 100%   Vitals:   11/11/23 0353 11/11/23 1251 11/11/23 2011 11/12/23 0513  BP: 121/67 119/68 129/69 (!) 116/59  Pulse: 64 70 75 62  Resp: 19 18 14 14   Temp: 98 F (36.7 C) 98.4 F (36.9 C) 97.7 F (36.5 C)  98.1 F (36.7 C)  TempSrc: Oral Oral    SpO2: 100% 100% 100% 100%  Weight:      Height:        General: Pt is alert, awake, not in acute distress, very deconditioned, manourished Cardiovascular: RRR, S1/S2 +, no rubs, no gallops Respiratory: CTA bilaterally, no wheezing, no rhonchi Abdominal: Soft, NT, ND, bowel sounds + Extremities: no edema, no cyanosis    The results of significant diagnostics from this hospitalization (including imaging, microbiology, ancillary and laboratory) are listed below for reference.     Microbiology: Recent Results (from the past 240 hours)  Resp panel by RT-PCR (RSV, Flu A&B, Covid) Anterior Nasal Swab     Status: None   Collection Time: 11/07/23 11:54 AM   Specimen: Anterior Nasal Swab  Result Value Ref Range Status   SARS Coronavirus 2 by RT PCR NEGATIVE NEGATIVE Final    Comment: (NOTE) SARS-CoV-2 target nucleic acids are NOT DETECTED.  The SARS-CoV-2 RNA is generally detectable in  upper respiratory specimens during the acute phase of infection. The lowest concentration of SARS-CoV-2 viral copies this assay can detect is 138 copies/mL. A negative result does not preclude SARS-Cov-2 infection and should not be used as the sole basis for treatment or other patient management decisions. A negative result may occur with  improper specimen collection/handling, submission of specimen other than nasopharyngeal swab, presence of viral mutation(s) within the areas targeted by this assay, and inadequate number of viral copies(<138 copies/mL). A negative result must be combined with clinical observations, patient history, and epidemiological information. The expected result is Negative.  Fact Sheet for Patients:  BloggerCourse.com  Fact Sheet for Healthcare Providers:  SeriousBroker.it  This test is no t yet approved or cleared by the [place] FDA and  has been authorized for detection and/or diagnosis of SARS-CoV-2 by FDA under an Emergency Use Authorization (EUA). This EUA will remain  in effect (meaning this test can be used) for the duration of the COVID-19 declaration under Section 564(b)(1) of the Act, 21 U.S.C.section 360bbb-3(b)(1), unless the authorization is terminated  or revoked sooner.       Influenza A by PCR NEGATIVE NEGATIVE Final   Influenza B by PCR NEGATIVE NEGATIVE Final    Comment: (NOTE) The Xpert Xpress SARS-CoV-2/FLU/RSV plus assay is intended as an aid in the diagnosis of influenza from Nasopharyngeal swab specimens and should not be used as a sole basis for treatment. Nasal washings and aspirates are unacceptable for Xpert Xpress SARS-CoV-2/FLU/RSV testing.  Fact Sheet for Patients: BloggerCourse.com  Fact Sheet for Healthcare Providers: SeriousBroker.it  This test is not yet approved or cleared by the [place] FDA and has been  authorized for detection and/or diagnosis of SARS-CoV-2 by FDA under an Emergency Use Authorization (EUA). This EUA will remain in effect (meaning this test can be used) for the duration of the COVID-19 declaration under Section 564(b)(1) of the Act, 21 U.S.C. section 360bbb-3(b)(1), unless the authorization is terminated or revoked.     Resp Syncytial Virus by PCR NEGATIVE NEGATIVE Final    Comment: (NOTE) Fact Sheet for Patients: BloggerCourse.com  Fact Sheet for Healthcare Providers: SeriousBroker.it  This test is not yet approved or cleared by the [place] FDA and has been authorized for detection and/or diagnosis of SARS-CoV-2 by FDA under an Emergency Use Authorization (EUA). This EUA will remain in effect (meaning this test can be used) for the duration of the COVID-19 declaration under Section 564(b)(1) of the Act, 21 U.S.C.  section 360bbb-3(b)(1), unless the authorization is terminated or revoked.  Performed at [facility], [address], [place], [place] [address]   Blood Culture (routine x 2)     Status: None   Collection Time: 11/07/23 12:15 PM   Specimen: BLOOD  Result Value Ref Range Status   Specimen Description   Final    BLOOD LEFT ANTECUBITAL Performed at [facility], [address], [place], [place] [address]    Special Requests   Final    BOTTLES DRAWN AEROBIC AND ANAEROBIC Blood Culture adequate volume Performed at [facility], [address] [place], [place] [address]    Culture   Final    NO GROWTH 5 DAYS Performed at [facility], [address], [place], [place] [address]    Report Status 11/12/2023 FINAL  Final  Blood Culture (routine x 2)     Status: None   Collection Time: 11/07/23 12:15 PM   Specimen: BLOOD RIGHT ARM  Result Value Ref Range Status   Specimen Description   Final    BLOOD RIGHT ARM Performed at [facility], [address] [place], [place] [address]    Special Requests   Final    BOTTLES DRAWN AEROBIC AND ANAEROBIC Blood Culture results may not be optimal due to an inadequate volume of blood received in culture bottles Performed at [facility], [address] [place], [place] [address]    Culture   Final    NO GROWTH 5 DAYS Performed at [facility], [address], [place], [place] [address]    Report Status 11/12/2023 FINAL  Final  Urine Culture     Status: Abnormal   Collection Time: 11/07/23  1:35 PM   Specimen: Urine, Catheterized  Result Value Ref Range Status   Specimen Description   Final    URINE, CATHETERIZED Performed at [facility], [address] [place], [place] [address]    Special Requests   Final    NONE Reflexed from [id number] Performed at [facility], [address], [place], [place] [address]    Culture (A)  Final    60,000 COLONIES/mL ESCHERICHIA COLI Two isolates with different morphologies were identified as the same organism.The most resistant organism was reported. Performed at [facility], [address] [place], [place] [address]    Report Status 11/10/2023 FINAL  Final   Organism ID, Bacteria ESCHERICHIA COLI (A)  Final      Susceptibility   Escherichia coli - MIC*    AMPICILLIN >=32 RESISTANT Resistant     CEFAZOLIN >=64 RESISTANT Resistant     CEFEPIME <=0.12 SENSITIVE Sensitive     CEFTRIAXONE 0.5 SENSITIVE Sensitive     CIPROFLOXACIN <=0.25 SENSITIVE Sensitive     GENTAMICIN <=1 SENSITIVE Sensitive     IMIPENEM <=0.25 SENSITIVE Sensitive     NITROFURANTOIN 64 INTERMEDIATE Intermediate     TRIMETH/SULFA <=20 SENSITIVE Sensitive     AMPICILLIN/SULBACTAM >=32 RESISTANT Resistant     PIP/TAZO 8 SENSITIVE Sensitive ug/mL    * 60,000 COLONIES/mL ESCHERICHIA COLI  Urine Culture     Status: Abnormal   Collection Time: 11/08/23 11:42 AM   Specimen: Urine, Random  Result Value Ref Range  Status   Specimen Description   Final    URINE, RANDOM Performed at [facility], [address], [place], [place] [address]    Special Requests  Final    NONE Reflexed from [id number] Performed at [facility], [address], [place], [place] [address]    Culture (A)  Final    <10,000 COLONIES/mL INSIGNIFICANT GROWTH Performed at [facility], [address], [place], [place] [address]    Report Status 11/10/2023 FINAL  Final     Labs: BNP (last 3 results) No results for input(s): "BNP" in the last 8760 hours. Basic Metabolic Panel: Recent Labs  Lab 11/08/23 0513 11/08/23 0818 11/09/23 0536 11/10/23 0515 11/11/23 0511  NA 152* 153* 146* 147* 146*  K 4.3 3.9 3.9 3.4* 3.4*  CL 126* 125* 120* 115* 116*  CO2 20* 19* 21* 24 25  GLUCOSE 85 87 117* 102* 107*  BUN 84* 79* 50* 31* 23  CREATININE 1.88* 1.82* 1.17 0.87 0.89  CALCIUM 8.1* 8.1* 8.1* 8.2* 8.1*   Liver Function Tests: Recent Labs  Lab 11/07/23 1215  AST 37  ALT 27  ALKPHOS 76  BILITOT 0.8  PROT 6.0*  ALBUMIN 2.7*   No results for input(s): "LIPASE", "AMYLASE" in the last 168 hours. No results for input(s): "AMMONIA" in the last 168 hours. CBC: Recent Labs  Lab 11/07/23 1215 11/08/23 0513 11/09/23 0536  WBC 7.0 7.5 7.9  NEUTROABS 4.9  --   --   HGB 10.6* 7.6* 9.1*  HCT 34.6* 26.6* 30.6*  MCV 105.2* 112.2* 108.1*  PLT 514* 343 362   Cardiac Enzymes: No results for input(s): "CKTOTAL", "CKMB", "CKMBINDEX", "TROPONINI" in the last 168 hours. BNP: Invalid input(s): "POCBNP" CBG: Recent Labs  Lab 11/07/23 2340 11/08/23 0747 11/08/23 1136  GLUCAP 82 77 92   D-Dimer No results for input(s): "DDIMER" in the last 72 hours. Hgb A1c No results for input(s): "HGBA1C" in the last 72 hours. Lipid Profile No results for input(s): "CHOL", "HDL", "LDLCALC", "TRIG", "CHOLHDL", "LDLDIRECT" in the last 72 hours. Thyroid function studies No results for input(s):  "TSH", "T4TOTAL", "T3FREE", "THYROIDAB" in the last 72 hours.  Invalid input(s): "FREET3" Anemia work up No results for input(s): "VITAMINB12", "FOLATE", "FERRITIN", "TIBC", "IRON", "RETICCTPCT" in the last 72 hours. Urinalysis    Component Value Date/Time   COLORURINE YELLOW 11/08/2023 1142   APPEARANCEUR CLOUDY (A) 11/08/2023 1142   LABSPEC 1.017 11/08/2023 1142   PHURINE 5.0 11/08/2023 1142   GLUCOSEU NEGATIVE 11/08/2023 1142   HGBUR SMALL (A) 11/08/2023 1142   BILIRUBINUR NEGATIVE 11/08/2023 1142   KETONESUR NEGATIVE 11/08/2023 1142   PROTEINUR NEGATIVE 11/08/2023 1142   NITRITE NEGATIVE 11/08/2023 1142   LEUKOCYTESUR LARGE (A) 11/08/2023 1142   Sepsis Labs Recent Labs  Lab 11/07/23 1215 11/08/23 0513 11/09/23 0536  WBC 7.0 7.5 7.9   Microbiology Recent Results (from the past 240 hours)  Resp panel by RT-PCR (RSV, Flu A&B, Covid) Anterior Nasal Swab     Status: None   Collection Time: 11/07/23 11:54 AM   Specimen: Anterior Nasal Swab  Result Value Ref Range Status   SARS Coronavirus 2 by RT PCR NEGATIVE NEGATIVE Final    Comment: (NOTE) SARS-CoV-2 target nucleic acids are NOT DETECTED.  The SARS-CoV-2 RNA is generally detectable in upper respiratory specimens during the acute phase of infection. The lowest concentration of SARS-CoV-2 viral copies this assay can detect is 138 copies/mL. A negative result does not preclude SARS-Cov-2 infection and should not be used as the sole basis for treatment or other patient management decisions. A negative result may occur with  improper specimen collection/handling, submission of specimen other than nasopharyngeal  swab, presence of viral mutation(s) within the areas targeted by this assay, and inadequate number of viral copies(<138 copies/mL). A negative result must be combined with clinical observations, patient history, and epidemiological information. The expected result is Negative.  Fact Sheet for Patients:   BloggerCourse.com  Fact Sheet for Healthcare Providers:  SeriousBroker.it  This test is no t yet approved or cleared by the [place] FDA and  has been authorized for detection and/or diagnosis of SARS-CoV-2 by FDA under an Emergency Use Authorization (EUA). This EUA will remain  in effect (meaning this test can be used) for the duration of the COVID-19 declaration under Section 564(b)(1) of the Act, 21 U.S.C.section 360bbb-3(b)(1), unless the authorization is terminated  or revoked sooner.       Influenza A by PCR NEGATIVE NEGATIVE Final   Influenza B by PCR NEGATIVE NEGATIVE Final    Comment: (NOTE) The Xpert Xpress SARS-CoV-2/FLU/RSV plus assay is intended as an aid in the diagnosis of influenza from Nasopharyngeal swab specimens and should not be used as a sole basis for treatment. Nasal washings and aspirates are unacceptable for Xpert Xpress SARS-CoV-2/FLU/RSV testing.  Fact Sheet for Patients: BloggerCourse.com  Fact Sheet for Healthcare Providers: SeriousBroker.it  This test is not yet approved or cleared by the [place] FDA and has been authorized for detection and/or diagnosis of SARS-CoV-2 by FDA under an Emergency Use Authorization (EUA). This EUA will remain in effect (meaning this test can be used) for the duration of the COVID-19 declaration under Section 564(b)(1) of the Act, 21 U.S.C. section 360bbb-3(b)(1), unless the authorization is terminated or revoked.     Resp Syncytial Virus by PCR NEGATIVE NEGATIVE Final    Comment: (NOTE) Fact Sheet for Patients: BloggerCourse.com  Fact Sheet for Healthcare Providers: SeriousBroker.it  This test is not yet approved or cleared by the [place] FDA and has been authorized for detection and/or diagnosis of SARS-CoV-2 by FDA under an Emergency Use  Authorization (EUA). This EUA will remain in effect (meaning this test can be used) for the duration of the COVID-19 declaration under Section 564(b)(1) of the Act, 21 U.S.C. section 360bbb-3(b)(1), unless the authorization is terminated or revoked.  Performed at [facility], [address] [place], [place] [address]   Blood Culture (routine x 2)     Status: None   Collection Time: 11/07/23 12:15 PM   Specimen: BLOOD  Result Value Ref Range Status   Specimen Description   Final    BLOOD LEFT ANTECUBITAL Performed at [facility], [address] [place], [place] [address]    Special Requests   Final    BOTTLES DRAWN AEROBIC AND ANAEROBIC Blood Culture adequate volume Performed at [facility], [address] [place], [place] [address]    Culture   Final    NO GROWTH 5 DAYS Performed at [facility], [address] [place], [place] [address]    Report Status 11/12/2023 FINAL  Final  Blood Culture (routine x 2)     Status: None   Collection Time: 11/07/23 12:15 PM   Specimen: BLOOD RIGHT ARM  Result Value Ref Range Status   Specimen Description   Final    BLOOD RIGHT ARM Performed at [facility], [address], [place], [place] [address]    Special Requests   Final    BOTTLES DRAWN AEROBIC AND ANAEROBIC Blood Culture results may not be optimal due to an inadequate volume of blood  received in culture bottles Performed at [facility], [address], [place], [place] [address]    Culture   Final    NO GROWTH 5 DAYS Performed at [facility], [address] [place], [place] [address]    Report Status 11/12/2023 FINAL  Final  Urine Culture     Status: Abnormal   Collection Time: 11/07/23  1:35 PM   Specimen: Urine, Catheterized  Result Value Ref Range Status   Specimen Description   Final    URINE, CATHETERIZED Performed at [facility], [address], [place], [place]  [address]    Special Requests   Final    NONE Reflexed from [id number] Performed at [facility], [address] [place], [place] [address]    Culture (A)  Final    60,000 COLONIES/mL ESCHERICHIA COLI Two isolates with different morphologies were identified as the same organism.The most resistant organism was reported. Performed at [facility], [address], [place], [place] [address]    Report Status 11/10/2023 FINAL  Final   Organism ID, Bacteria ESCHERICHIA COLI (A)  Final      Susceptibility   Escherichia coli - MIC*    AMPICILLIN >=32 RESISTANT Resistant     CEFAZOLIN >=64 RESISTANT Resistant     CEFEPIME <=0.12 SENSITIVE Sensitive     CEFTRIAXONE 0.5 SENSITIVE Sensitive     CIPROFLOXACIN <=0.25 SENSITIVE Sensitive     GENTAMICIN <=1 SENSITIVE Sensitive     IMIPENEM <=0.25 SENSITIVE Sensitive     NITROFURANTOIN 64 INTERMEDIATE Intermediate     TRIMETH/SULFA <=20 SENSITIVE Sensitive     AMPICILLIN/SULBACTAM >=32 RESISTANT Resistant     PIP/TAZO 8 SENSITIVE Sensitive ug/mL    * 60,000 COLONIES/mL ESCHERICHIA COLI  Urine Culture     Status: Abnormal   Collection Time: 11/08/23 11:42 AM   Specimen: Urine, Random  Result Value Ref Range Status   Specimen Description   Final    URINE, RANDOM Performed at [facility], [address] [place], [place] [address]    Special Requests   Final    NONE Reflexed from [id number] Performed at [facility], [address] [place], [place] [address]    Culture (A)  Final    <10,000 COLONIES/mL INSIGNIFICANT GROWTH Performed at [facility], [address] [place], [place] [address]    Report Status 11/10/2023 FINAL  Final    Please note: You were cared for by a hospitalist during your hospital stay. Once you are discharged, your primary care physician will handle any further medical issues. Please note that NO REFILLS for any discharge medications will be authorized once  you are discharged, as it is imperative that you return to your primary care physician (or establish a relationship with a primary care physician if you do not have one) for your post hospital discharge needs so that they can reassess your need for medications and monitor your lab values.    Time coordinating discharge: 40 minutes  SIGNED:   Burnadette Pop, MD  [facility] 11/12/2023, 10:36 AM Pager [phone]  If 7PM-7AM, please contact night-coverage [email or web address] Password [email or web address]

## 2023-11-12 NOTE — Care Management Important Message (Signed)
 Important Message  Patient Details IM Letter placed in room for family Name: FINAS DELONE MRN: [id number] Date of Birth: Aug 27, 1950   Important Message Given:  Yes - Medicare IM     Caren Macadam 11/12/2023, 10:50 AM

## 2023-11-12 NOTE — Progress Notes (Signed)
 Report given to receiving facility at this time. PTAR here for transportation.

## 2023-11-12 NOTE — TOC Transition Note (Signed)
 Transition of Care [facility]) - Discharge Note  Patient Details  Name: Ernest Campbell MRN: [id number] Date of Birth: [birth year]  Transition of Care [facility]) CM/SW Contact:  Ewing Schlein, LCSW Phone Number: 11/12/2023, 11:47 AM  Clinical Narrative: Patient is medically stable to discharge to [facility] today for rehab and palliative care is recommending outpatient palliative care follow the patient. Wife agreeable to palliative care referral and [facility] uses [employer]. Outpatient palliative referral made to [facility] and Melissa with [facility].  The number for report is [id number]. Discharge summary, discharge orders, SNF transfer report, and FL2 faxed to facility in hub. Medical necessity form done; PTAR scheduled. Discharge packet completed. Wife notified of transportation being set up. RN updated. TOC signing off.  Final next level of care: Skilled Nursing Facility Barriers to Discharge: Barriers Resolved  Patient Goals and CMS Choice Patient states their goals for this hospitalization and ongoing recovery are:: Return to [facility] for rehab [employer].gov Compare Post Acute Care list provided to:: Patient Represenative (must comment) (Ernest Campbell (spouse)) Choice offered to / list presented to : Spouse  Discharge Placement Existing PASRR number confirmed : 11/11/23          Patient chooses bed at: Other - please specify in the comment section below: [facility]) Patient to be transferred to facility by: PTAR Name of family member notified: Ernest Campbell (spouse) Patient and family notified of of transfer: 11/12/23  Discharge Plan and Services Additional resources added to the After Visit Summary for   In-house Referral: Clinical Social Work Post Acute Care Choice: Skilled Nursing Facility          DME Arranged: N/A DME Agency: NA  Social Drivers of Health (SDOH) Interventions SDOH Screenings   Food Insecurity: No Food Insecurity (11/07/2023)  Housing: Unknown  (11/07/2023)  Transportation Needs: No Transportation Needs (11/07/2023)  Utilities: Not At Risk (11/07/2023)  Financial Resource Strain: Low Risk  (10/20/2022)   Received from [facility]  Physical Activity: Unknown (10/20/2022)   Received from [facility]  Social Connections: Unknown (11/07/2023)  Recent Concern: Social Connections - Moderately Isolated (10/24/2023)  Stress: Patient Declined (10/20/2022)   Received from [facility], [facility]  Tobacco Use: High Risk (11/07/2023)   Readmission Risk Interventions    11/08/2023    1:14 PM  Readmission Risk Prevention Plan  Transportation Screening Complete  Medication Review (RN Care Manager) Complete  PCP or Specialist appointment within 3-5 days of discharge Not Complete  PCP/Specialist Appt Not Complete comments Patient will return to rehab at discharge.  HRI or Home Care Consult Complete  SW Recovery Care/Counseling Consult Complete  Palliative Care Screening Not Applicable  Skilled Nursing Facility Complete

## 2023-11-12 NOTE — Progress Notes (Signed)
 Speech Language Pathology Treatment: Dysphagia  Patient Details Name: Ernest Campbell MRN: [id number] DOB: 01-10-1951 Today's Date: 11/12/2023 Time: 0850-0905 SLP Time Calculation (min) (ACUTE ONLY): 15 min  Assessment / Plan / Recommendation Clinical Impression  Patient seen by SLP for skilled treatment focused on dysphagia goals. Niece was in room feeding him some soft solids (approximately dys 2 (minced), biscuits in gravy, scrambled eggs). Patient was awake and alert but niece feels he is more sleepy today. He exhibited prolonged mastication, bolus formation and oral holding/residuals. He occasionally spit out a small piece of food. Instances of delayed, dry sounding cough observed with thin liquids.  Plan is for discharge back to SNF today and per niece, they plan to have at least one family member with him at SNF to assist with feeding, etc. As niece was feeding patient very carefully, slowly and was very attentive to when he had PO's still in his mouth, was more distracted, SLP advised her to ensure that any family member or caregiver is feeding him carefully and slowly like she is doing. Although patient is at risk for aspiration, he is also at risk for dehydration and malnutrition. SLP recommending continue with Dys 1 (puree) solids, thin liquids at hospital and for family to work with SLP at [facility] for advanced PO texture trials.    HPI HPI: 73 yo presenting with severe dehydration, AKI, and suspicion for UTI. Also found to have oral thrush. PMH includes: CVA, TBI, cognitive deficits, macrocytic anemia, frequent falls, femur fracture. Prior to December he was living at home with his wife, but since then he has had multiple hospital admissions and SNF stays. Swallow assessment in December was functional, but with concern for adequate intake in light of mentation.      SLP Plan  Continue with current plan of care      Recommendations for follow up therapy are one component of a  multi-disciplinary discharge planning process, led by the attending physician.  Recommendations may be updated based on patient status, additional functional criteria and insurance authorization.    Recommendations  Diet recommendations: Thin liquid;Dysphagia 1 (puree) Liquids provided via: Straw;Cup Medication Administration: Crushed with puree Supervision: Full supervision/cueing for compensatory strategies;Staff to assist with self feeding;Trained caregiver to feed patient Compensations: Slow rate;Small sips/bites;Minimize environmental distractions;Follow solids with liquid Postural Changes and/or Swallow Maneuvers: Seated upright 90 degrees                  Oral care BID;Staff/trained caregiver to provide oral care;Oral care before and after PO   Frequent or constant Supervision/Assistance Dysphagia, unspecified (R13.10)     Continue with current plan of care     Angela Nevin, MA, CCC-SLP Speech Therapy

## 2023-11-20 ENCOUNTER — Encounter: Payer: Self-pay | Admitting: Neurology

## 2023-11-25 ENCOUNTER — Encounter: Payer: Self-pay | Admitting: Neurology

## 2024-01-06 ENCOUNTER — Telehealth: Payer: Self-pay | Admitting: Neurology

## 2024-01-06 NOTE — Telephone Encounter (Signed)
 Kiki from [facility] from called wanting to know if VO for skilled nursing can be signed temp due to the pt's PCP office has been closed down so they just need this signature till the pt gets another PCP.

## 2024-01-06 NOTE — Telephone Encounter (Signed)
 Attempted to call Kiki. No answer, LVM for call back.

## 2024-01-06 NOTE — Telephone Encounter (Signed)
 Ernest Campbell called back and I was able to take the call. She is asking about VO such as  HH PT/OT orders and whether we could take over this temporarily. I advised we could until pt est with PCP

## 2024-01-07 ENCOUNTER — Telehealth: Payer: Self-pay | Admitting: Neurology

## 2024-01-07 NOTE — Telephone Encounter (Signed)
 Called and provided the VO for the pt for [facility] PT as requested.

## 2024-01-07 NOTE — Telephone Encounter (Signed)
 [facility] (Monique) requesting verbal orders for physical therapy. Frequency:  2x week for 3 weeks 1x wk for 3 weeks

## 2024-01-13 ENCOUNTER — Telehealth: Payer: Self-pay

## 2024-01-13 NOTE — Telephone Encounter (Signed)
 Rec'd call from [facility] Elena Grieves) requesting orders for ST. Gave VO for ST 1 x wk for 6wks for cognitive and speech deficits.

## 2024-01-22 ENCOUNTER — Ambulatory Visit (INDEPENDENT_AMBULATORY_CARE_PROVIDER_SITE_OTHER): Payer: Medicare Other | Admitting: Neurology

## 2024-01-22 VITALS — BP 92/52 | HR 81 | Ht 65.0 in | Wt 111.0 lb

## 2024-01-22 DIAGNOSIS — F05 Delirium due to known physiological condition: Secondary | ICD-10-CM

## 2024-01-22 DIAGNOSIS — R2689 Other abnormalities of gait and mobility: Secondary | ICD-10-CM | POA: Diagnosis not present

## 2024-01-22 DIAGNOSIS — S069X0S Unspecified intracranial injury without loss of consciousness, sequela: Secondary | ICD-10-CM

## 2024-01-22 DIAGNOSIS — G218 Other secondary parkinsonism: Secondary | ICD-10-CM | POA: Diagnosis not present

## 2024-01-22 NOTE — Progress Notes (Signed)
 Patient: Ernest Campbell Date of Birth: 1951/06/08  Reason for Visit: Follow up History from: Patient, wife  Primary Neurologist: Dohmeier   ASSESSMENT AND PLAN 73 y.o. year old male   1.  Parkinsonism 2.  Gait abnormality 3.  Neurocognitive impairment, MMSE 14/30 4.  History of delirium  -Family wants answers about reason for neurocognitive impairment. They want a diagnosis, which we can explore with further testing.   -Clearly has had a decline in the last 6 months with multiple hospital admissions, UTIs, right hip fracture, right femur fracture.  Admissions to to rehab units.  Has just returned home within the last 3 weeks, has made good improvements since being home.   - Start by checking ATN profile, screen for markers of Alzheimer's - Check EEG as requested by Dr. Albertina Hugger at last visit - Continue Sinemet  25/100 mg 3 times daily -Continue Namenda  5 mg twice a day -Next steps: Discuss with Dr. Albertina Hugger further imaging to include DaTscan, metabolic PET scan - Follow-up in 4 months with Dr. Albertina Hugger, family had requested skin biopsy for FTD  HISTORY OF PRESENT ILLNESS: Today 01/22/24 Update 01/22/24 SS: Saw Dr. Albertina Hugger January 2025 for headaches went to the ER with delirium.  Agitated, restless, shuffling.  Lost 10 pounds.  Dr. Albertina Hugger prescribed temazepam  to help with sleep, worsening confusion.  Admitted in February for fall with right hip fracture.  Fall March 2025 at [facility] with right femur fracture.  Admission April 2025 for confusion with severe dehydration, hypernatremia, AKI, poor oral intake, UTI. Failure to thrive with referral to palliative care.   Here with his wife and family members. Back home with wife for 3 weeks, has gained 16 lbs, he is an eating machine. Watches TV. In a wheelchair, doesn't walk at all, except stand and pivot. He has 2 pressure ulcers, on antibiotics for UTI. Doing home health PT/ST/OT. He cannot have ESI due to pressure ulcers. He is thriving  since being home. Mental state is getting better. Sleeping 8 hours a night, through the night. Naps some during the day. Wife has mentioned dx of LBD or Dementia? Is he diagnosed? Need answers. Improving cognitively. MMSE 14/30.  01/31/23 SS: Last saw Dr. Albertina Hugger in June 2023.  He sustained a backwards fall hitting his head in 2022 which was felt to have initiated his troubles.  Visual hallucinations had resolved.  Responding well to Sinemet  25/100 mg 3 times daily.  His MMSE was 22/30 then 26/30 when calculations were swapped. Dr. Albertina Hugger felt atypical parkinsonian after trauma.  He may tolerate Aricept .  Not felt to be NPH.  He is seeing [facility] neurosurgery for low back pain.  Planning for caudal ESI.  It is difficult for him to have MRI due to laying flat. MOCA 17/30 today. Still trouble with short term memory. Feels his memory is improving to some degree. Wife credits removal of CSF from LP as improving his condition. He remains on Sinemet  25/100 3 times daily 8, 12, 6. They are not sure how much it has helped him. Wife mentions since the fall discovered his back issues, now gets every 3 months injections. When he gets tired his right leg will have a skip (tip toe walk) in it, felt to be back related. At his worst with memory, he didn't know his wife, thought she was a new woman. He is essentially back to normal with his cognition, in a lot of other ways he is a lot better. Wife recalls when Sinemet  was  started due to rigidity in his arms, no arm swing. Only 1-2 urinary accidents in the last year. Good daytime energy. He works in his yard, has a Probation officer.   HISTORY  02/01/22 Dr. Albertina Hugger: BRISTOL SOY is a 19 - year- old Caucasian male patient seen here in the presence of his spouse, upon consultation request by PCP,RV on  02/01/2022 ,  Dx with S 1/ L5 radiculopathy, with PD and MCI. He had a fall and developed TBI.  After dr Tresia Fruit reevaluated his  foot drop and back pain: bilateral EDB  peroneal motor nerves along with normal sensory conductions suggestive of bilateral L5/S1 radiculopathy. 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 EMG needle study was however normal which could indicate that these are remote findings. He is now receiving epidural injection through [employer], Dr.O'tuel ,MD.   Here reporting improvement , better strength but not yet stamina. He had almost not walked at all for 4 months when initially seen here. Has been shuffling less, memory improved as well ( subjective). He takes sinemet  25/ 100 mg 90 days for tid use and that has made the most impact.    Chief concern according to patient's wife: The patient fell from a kitchen counter on which she worked backwards and hit his head so this was a quite significant traumatic impact also initially neither his personality nor his abilities for change.  Within 3 wee   ks or so the spells noted a decline in gait stability, some more forgetfulness and memory lapses and also a change in facial expression.  He was not himself.  The patient presentedv to his orthopedic surgeon Dr. Constancia Delton.  From there he was referred for an MRI of the brain but he could not tolerate lying flat in the loud machine and therefore had to do it under sedation at the hospital.  At the hospital there was also an MRI of the spine all 3 compartments were reviewed cervical, lumbar and thoracic.  He was treated with steroids also I am not sure what the treated disease was.  He did have some lower back pain. 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  Rather mild canal stenosis.  Based on these findings and May 18, 2021 the patient had been started  on steroids.   He was started I on steroids and became delirious. S he wouldn't sleep and would crawl on the floor, was not responding to verbal stimuli.       Ernest Campbell has no medical HISTORY abstracted in EPIC.     The patent is a smoker, he hasn't been drinking alcohol  for 2 decades.  Retired from  Holiday representative, since 2016.at age 56, worked outdoors a lot. Caffeine - 2 coffees a day, in AM.   Family medical /sleep history: no  other family member on CPAP with OSA, insomnia, sleep walkers.    Social history:  Patient is lives in a household with spouse, childless,  married for 40 years.  Pets; one cat. Tobacco use yes.  ETOH use moderate not in years.  REVIEW OF SYSTEMS: Out of a complete 14 system review of symptoms, the patient complains only of the following symptoms, and all other reviewed systems are negative.  See  HPI  ALLERGIES: Allergies  Allergen Reactions   Fentanyl  And Related Anaphylaxis   Other Other (See Comments)    According to the patient, all opioids cause hallucinations    Prednisone  Other (See Comments)    Hallucinations - Allergic, per [facility]   Codeine Other (See Comments)    Hallucinations - Allergic, per [facility]   Morphine  Other (See Comments)    Hallucinations - Allergic, per [facility]   Oxycodone Other (See Comments)    Hallucinations - Allergic, per [facility]    HOME MEDICATIONS: Outpatient Medications Prior to Visit  Medication Sig Dispense Refill   acetaminophen  (TYLENOL ) 650 MG suppository Place 650 mg rectally every 6 (six) hours as needed for fever (or pain).     Carbidopa -Levodopa  ER (SINEMET  CR) 25-100 MG tablet controlled release Take 1 tablet by mouth See admin instructions. Take 1 tablet by mouth at 9 AM, 3 PM, and 9 PM     cephALEXin (KEFLEX) 250 MG/5ML suspension Take 500 mg by mouth 2 (two) times daily.     feeding supplement (ENSURE ENLIVE / ENSURE PLUS) LIQD Take 237 mLs by mouth 2 (two) times daily between meals.     fluticasone   (FLONASE ) 50 MCG/ACT nasal spray Place 1 spray into both nostrils daily. (Patient taking differently: Place 1 spray into both nostrils daily as needed for allergies.)     ibuprofen  (ADVIL ) 200 MG tablet Take 1 tablet (200 mg total) by mouth every 12 (twelve) hours as needed. 30 tablet 0   memantine  (NAMENDA ) 5 MG tablet Take 1 tablet (5 mg total) by mouth 2 (two) times daily. 60 tablet 5   NUTRITIONAL SUPPLEMENTS PO Take 120 mLs by mouth See admin instructions. MedPass: Drink 120 ml's by mouth at 9 AM, 3 PM, and 9 PM     Multiple Vitamin (MULTIVITAMIN WITH MINERALS) TABS tablet Take 1 tablet by mouth daily.     docusate sodium  (COLACE) 100 MG capsule Take 1 capsule (100 mg total) by mouth daily. (Patient taking differently: Take 100 mg by mouth every evening.)     lidocaine  4 % Place 1 patch onto the skin See admin instructions. Apply 1 new patch to the lower back at 10 AM and remove at 10 PM     polyethylene glycol (MIRALAX  / GLYCOLAX ) 17 g packet Take 17 g by mouth daily as needed for mild constipation.     No facility-administered medications prior to visit.    PAST MEDICAL HISTORY: Past Medical History:  Diagnosis Date   Anemia    Dementia (HCC)    Stroke (HCC)     PAST SURGICAL HISTORY: Past Surgical History:  Procedure Laterality Date   INTRAMEDULLARY (IM) NAIL INTERTROCHANTERIC Right 09/26/2023   Procedure: INTRAMEDULLARY (IM) NAIL INTERTROCHANTERIC;  Surgeon: Laneta Pintos, MD;  Location: [facility] OR;  Service: Orthopedics;  Laterality: Right;   ORIF FEMUR FRACTURE Right 10/24/2023   Procedure: OPEN REDUCTION INTERNAL FIXATION FEMORAL SHAFT FRACTURE;  Surgeon: Laneta Pintos, MD;  Location: [facility] OR;  Service: Orthopedics;  Laterality: Right;    FAMILY HISTORY: Family History  Problem Relation Age of Onset   Dementia Mother    Parkinsonism Father    Stroke Father    Breast cancer Sister     SOCIAL HISTORY: Social History   Socioeconomic History   Marital status: Married     Spouse name: Not on file   Number of children: Not on file   Years of education: Not on file   Highest education level: Not  on file  Occupational History   Not on file  Tobacco Use   Smoking status: Every Day    Current packs/day: 0.50    Types: Cigarettes   Smokeless tobacco: Not on file  Substance and Sexual Activity   Alcohol use: Never   Drug use: Never   Sexual activity: Not on file  Other Topics Concern   Not on file  Social History Narrative   Lives with wife    Pt retired    Chief Executive Officer Drivers of Corporate investment banker Strain: Low Risk  (10/20/2022)   Received from [facility]   Overall Financial Resource Strain (CARDIA)    Difficulty of Paying Living Expenses: Not hard at all  Food Insecurity: No Food Insecurity (11/07/2023)   Hunger Vital Sign    Worried About Running Out of Food in the Last Year: Never true    Ran Out of Food in the Last Year: Never true  Transportation Needs: No Transportation Needs (11/07/2023)   PRAPARE - Administrator, Civil Service (Medical): No    Lack of Transportation (Non-Medical): No  Physical Activity: Unknown (10/20/2022)   Received from [facility]   Exercise Vital Sign    On average, how many days per week do you engage in moderate to strenuous exercise (like a brisk walk)?: Patient declined    On average, how many minutes do you engage in exercise at this level?: 30 min  Stress: Patient Declined (10/20/2022)   Received from [facility] - Occupational Stress Questionnaire    Feeling of Stress : Patient declined  Social Connections: Unknown (11/07/2023)   Social Connection and Isolation Panel    Frequency of Communication with Friends and Family: Once a week    Frequency of Social Gatherings with Friends and Family: Once a week    Attends Religious Services: Patient unable to answer    Active Member of Clubs or Organizations: Patient unable to answer    Attends Tax inspector Meetings: Patient unable to answer    Marital Status: Married  Recent Concern: Social Connections - Moderately Isolated (10/24/2023)   Social Connection and Isolation Panel    Frequency of Communication with Friends and Family: More than three times a week    Frequency of Social Gatherings with Friends and Family: Three times a week    Attends Religious Services: Never    Active Member of Clubs or Organizations: No    Attends Banker Meetings: Never    Marital Status: Married  Catering manager Violence: Not At Risk (11/07/2023)   Humiliation, Afraid, Rape, and Kick questionnaire    Fear of Current or Ex-Partner: No    Emotionally Abused: No    Physically Abused: No    Sexually Abused: No    PHYSICAL EXAM  Vitals:   01/22/24 1439  BP: (!) 92/52  Pulse: 81  Weight: 111 lb (50.3 kg)  Height: 5' 5 (1.651 m)    Body mass index is 18.47 kg/m.    01/22/2024    3:28 PM 02/01/2022   11:53 AM 07/03/2021    2:26 PM  MMSE - Mini Mental State Exam  Orientation to time 0 3 5  Orientation to Place 3 5 4   Registration 3 3 3   Attention/ Calculation 1 1 2   Recall 1 2 1   Language- name 2 objects 2 2 2   Language- repeat 1 1 1   Language- follow 3 step command 3  3 3  Language- read & follow direction 0 1 1  Write a sentence 0 1 1  Copy design 0 0 0  Total score 14 22 23    Generalized: Well developed, in no acute distress, mild masking face, deconditioned male  Neurological examination  Mentation: Alert oriented to person and situation, relies on family for history.  Speech is slow, low tone Cranial nerve II-XII: Pupils were equal round reactive to light. Extraocular movements were full, visual field were full on confrontational test. Facial sensation and strength were normal. Head turning and shoulder shrug  were normal and symmetric. Motor: Strength is overall intact, mild generalized weakness Sensory: Sensory testing is intact to soft touch on all 4  extremities. No evidence of extinction is noted.  Coordination: Difficulty with exam commands, more difficulty on the left than the right, there was dysmetria with finger-nose-finger, could not perform heel-to-shin Gait and station: Able to stand from seated position with two-person assist, when standing, unsteady, forward leaning, knees bent, only wearing socks that were fluffy and not non-stick Reflexes: Deep tendon reflexes are symmetric and normal bilaterally.   DIAGNOSTIC DATA (LABS, IMAGING, TESTING) - I reviewed patient records, labs, notes, testing and imaging myself where available.  Lab Results  Component Value Date   WBC 7.9 11/09/2023   HGB 9.1 (L) 11/09/2023   HCT 30.6 (L) 11/09/2023   MCV 108.1 (H) 11/09/2023   PLT 362 11/09/2023      Component Value Date/Time   NA 146 (H) 11/11/2023 0511   NA 138 07/03/2021 1539   K 3.4 (L) 11/11/2023 0511   CL 116 (H) 11/11/2023 0511   CO2 25 11/11/2023 0511   GLUCOSE 107 (H) 11/11/2023 0511   BUN 23 11/11/2023 0511   BUN 19 07/03/2021 1539   CREATININE 0.89 11/11/2023 0511   CALCIUM  8.1 (L) 11/11/2023 0511   CALCIUM  9.0 10/02/2023 0000   PROT 6.0 (L) 11/07/2023 1215   PROT 6.4 07/03/2021 1539   ALBUMIN 2.7 (L) 11/07/2023 1215   ALBUMIN 4.3 07/03/2021 1539   AST 37 11/07/2023 1215   ALT 27 11/07/2023 1215   ALKPHOS 76 11/07/2023 1215   BILITOT 0.8 11/07/2023 1215   BILITOT 0.3 07/03/2021 1539   GFRNONAA >60 11/11/2023 0511   Lab Results  Component Value Date   CHOL 189 07/19/2023   HDL 51 07/19/2023   LDLCALC 128 (H) 07/19/2023   TRIG 48 07/19/2023   CHOLHDL 3.7 07/19/2023   Lab Results  Component Value Date   HGBA1C 5.8 (H) 07/19/2023   Lab Results  Component Value Date   VITAMINB12 423 10/24/2023   Lab Results  Component Value Date   TSH 1.99 10/02/2023    Jeanmarie Millet, AGNP-C, DNP 01/22/2024, 4:17 PM [facility] [address], [address] [place], [place] [address] [phone]

## 2024-01-22 NOTE — Patient Instructions (Signed)
 Check ATN profile to screen for Alzheimer's  Check EEG

## 2024-01-25 LAB — ATN PROFILE
A -- Beta-amyloid 42/40 Ratio: 0.099 — ABNORMAL LOW (ref >0.102)
Beta-amyloid 40: 270.62 pg/mL
Beta-amyloid 42: 26.74 pg/mL
N -- NfL, Plasma: 8.95 pg/mL — ABNORMAL HIGH (ref 0.00–6.04)
T -- p-tau181: 1.2 pg/mL — ABNORMAL HIGH (ref 0.00–0.97)

## 2024-01-27 ENCOUNTER — Telehealth: Payer: Self-pay | Admitting: Neurology

## 2024-01-27 NOTE — Telephone Encounter (Signed)
 Please call the patient, markers for Alzheimer's came back positive, A+T+N+ (positive for amyloid, tau, neurodegeneration). These results are consistent with the presence of Alzheimer's related pathology.   Next steps: Please have EEG completed (scheduled this Friday), continue with medication. With + Alzheimer's makers testing results, Aricept  was tolerated previously,  We could consider increasing Namenda  10 mg BID. Would not be a candidate for [place] or [place] due low memory testing.

## 2024-01-27 NOTE — Addendum Note (Signed)
 Addended by: JOSHUA IZETTA CROME on: 01/27/2024 02:11 PM   Modules accepted: Orders

## 2024-01-27 NOTE — Telephone Encounter (Signed)
 Wife of pt has returned call to RN

## 2024-01-27 NOTE — Telephone Encounter (Signed)
 Call to wife, she verbalized understanding of results and in agreement to do Namenda  10 mg twice daily but also wants patient memory test redone in a few weeks as she feels that his scored was skewed from recovering form hospital related delirium. I advised I would send to provider and call back to schedule if Lauraine is in agreement.

## 2024-01-27 NOTE — Telephone Encounter (Signed)
 1st attempt, no naswer. Left message to return call

## 2024-01-31 ENCOUNTER — Ambulatory Visit (INDEPENDENT_AMBULATORY_CARE_PROVIDER_SITE_OTHER): Admitting: Neurology

## 2024-01-31 DIAGNOSIS — F05 Delirium due to known physiological condition: Secondary | ICD-10-CM

## 2024-01-31 DIAGNOSIS — R4182 Altered mental status, unspecified: Secondary | ICD-10-CM

## 2024-01-31 NOTE — Procedures (Signed)
    History:  73 year old man with altered mental status   EEG classification: Awake and drowsy  Duration: 25 minutes   Technical aspects: This EEG study was done with scalp electrodes positioned according to the 10-20 International system of electrode placement. Electrical activity was reviewed with band pass filter of 1-70Hz , sensitivity of 7 uV/mm, display speed of 13mm/sec with a 60Hz  notched filter applied as appropriate. EEG data were recorded continuously and digitally stored.   Description of the recording: The background rhythms of this recording consists of a fairly well modulated medium amplitude delta-theta activity. Photic stimulation was performed, did not show any abnormalities. Hyperventilation was not performed. No abnormal epileptiform discharges seen during this recording. There was no focal slowing. There were no electrographic seizure identified. There were generalized discharges with triphasic morphology.    Abnormality: Mild to moderate diffuse slowing with occasional generalized discharges with triphasic morphology.   Impression: This is an abnormal awake EEG due to mild to moderate diffuse slowing with occasional generalized discharges with triphasic morphology. This is consistent with a moderate generalized brain dysfunction, such as encephalopathy, nonspecific etiology.    Arlett Goold, MD [facility]

## 2024-02-04 ENCOUNTER — Telehealth: Payer: Self-pay

## 2024-02-04 NOTE — Telephone Encounter (Signed)
 Call from wife who reports since the increase to Namenda  10 mg twice daily the patient has been more lethargic and sleeping more. She did state that patient just finished a 10 day course of antibiotics for suspected UTI. She did not remember name of antibiotic. He has been off for 3 days. Advised I would send to Lauraine for review. Wife appreciative.

## 2024-02-04 NOTE — Telephone Encounter (Signed)
 It is okay to reduce Namenda  to 5 mg twice daily to see if improvement. Also may take some time to recover from infection. Of note, EEG last week showed findings consistent with encephalopathy, with diffuse slowing. Keep appointment with Dr. Chalice coming up. I will discuss with Dr. Chalice next week if anything further needs to be completed. Thanks   Impression: This is an abnormal awake EEG due to mild to moderate diffuse slowing with occasional generalized discharges with triphasic morphology. This is consistent with a moderate generalized brain dysfunction, such as encephalopathy, nonspecific etiology.

## 2024-02-11 NOTE — Telephone Encounter (Signed)
 Lvm 1st attempt by hf 02/11/24

## 2024-02-11 NOTE — Telephone Encounter (Signed)
 [name] then mentioned not wanting to repeat the memory test in the near future because that will put to much stress on them.

## 2024-02-11 NOTE — Telephone Encounter (Signed)
 Called and spoke to robbin and relayed the following results: It is okay to reduce Namenda  to 5 mg twice daily to see if improvement. Also may take some time to recover from infection. Of note, EEG last week showed findings consistent with encephalopathy, with diffuse slowing. Keep appointment with Dr. Chalice coming up. I will discuss with Dr. Chalice next week if anything further needs to be completed. Thanks    Impression: This is an abnormal awake EEG due to mild to moderate diffuse slowing with occasional generalized discharges with triphasic morphology. This is consistent with a moderate generalized brain dysfunction, such as encephalopathy, nonspecific etiology.   Encephalopathy with diffuse slowing refers to a condition where the brain's electrical activity (as seen on an EEG) shows a widespread slowing of brainwaves, indicating a generalized dysfunction of the brain    Pt caregiver would like to know if the condition, is progressive and if there is anything they should be doing to help on a daily. Does having the alzheimer marker mean already having it or that he likely will? [name] needs to know how to better care for him and what his baseline is. [name] doesn't think the namenda  is helping and thinks the memory issues are delirium not alzheimer related  Routing to slack np for better clarification

## 2024-02-11 NOTE — Telephone Encounter (Signed)
 Patient asking for a call back to discuss Namenda  and have some questions about results.

## 2024-02-11 NOTE — Telephone Encounter (Signed)
 Pt called returning Phone call  , Informed pt she will get a call back

## 2024-02-12 NOTE — Telephone Encounter (Signed)
 Pt scheduled for 02/19/24 at 8:30 AM

## 2024-02-19 ENCOUNTER — Other Ambulatory Visit: Payer: Self-pay | Admitting: Neurology

## 2024-02-19 ENCOUNTER — Ambulatory Visit (INDEPENDENT_AMBULATORY_CARE_PROVIDER_SITE_OTHER): Admitting: Neurology

## 2024-02-19 ENCOUNTER — Telehealth: Payer: Self-pay | Admitting: Neurology

## 2024-02-19 ENCOUNTER — Encounter: Payer: Self-pay | Admitting: Neurology

## 2024-02-19 VITALS — BP 120/64 | HR 80 | Ht 65.0 in | Wt 111.0 lb

## 2024-02-19 DIAGNOSIS — G218 Other secondary parkinsonism: Secondary | ICD-10-CM

## 2024-02-19 DIAGNOSIS — G8220 Paraplegia, unspecified: Secondary | ICD-10-CM

## 2024-02-19 DIAGNOSIS — E43 Unspecified severe protein-calorie malnutrition: Secondary | ICD-10-CM

## 2024-02-19 DIAGNOSIS — G309 Alzheimer's disease, unspecified: Secondary | ICD-10-CM

## 2024-02-19 DIAGNOSIS — G219 Secondary parkinsonism, unspecified: Secondary | ICD-10-CM | POA: Diagnosis not present

## 2024-02-19 DIAGNOSIS — R441 Visual hallucinations: Secondary | ICD-10-CM

## 2024-02-19 DIAGNOSIS — F02C2 Dementia in other diseases classified elsewhere, severe, with psychotic disturbance: Secondary | ICD-10-CM

## 2024-02-19 MED ORDER — NUPLAZID 34 MG PO CAPS: ORAL_CAPSULE | ORAL | 5 refills | Status: DC

## 2024-02-19 MED ORDER — ALPRAZOLAM 0.5 MG PO TABS: 0.5000 mg | ORAL_TABLET | Freq: Every evening | ORAL | 0 refills | Status: DC | PRN

## 2024-02-19 NOTE — Progress Notes (Addendum)
 Provider:  Dedra Gores, MD  Primary Care Physician:  Sallee Barks, MD (Inactive) No address on file     Referring Provider:         Chief Complaint according to patient   Patient presents with:     Pt with wife and cousin, here to follow up as he has had some improvement in mental status. Been home for 7 weeks. Receiving home health therapies and nurse. He has tried and failed aricept  and had upset stomach. He was on higher dose of namenda  and had side effects but is tolerating the 5 mg BID but wife doesn't note a difference or improvement.            HISTORY OF PRESENT ILLNESS:  Ernest Campbell is a 73 y.o. male patient who is here for revisit 02/19/2024 for  finding a dx .  Chief concern according to wife  :    I would like to give a brief summary Ernest Campbell had presented originally to us  in November 2022 around the time of Thanksgiving and his diagnosis was basically the symptom of shuffling gait for.  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   He had woken up with headaches on in the morning went to the emergency room as he never usually had headaches and was given a migraine cocktail from which she did not recover.  He received  benadryl , fentanyl , phenergan and finally ativan .       he became extremely confused he was quite grabbing invisible objects in the air his he was encephalopathic, his speech changed his gait became extremely shuffled and finally he also developed some rigidity he had fallen out of his chair.   He at one time walked out of his home went to a neighbor's house and asked him to call the police.  He was very agitated at the time restless and was about 4 weeks and what I called delirium when I saw him in these 4 weeks he lost 10 pounds.  He had an LP to rule out infection and normal pressure hydrocephalus everything was negative the opening pressure  was 10 cm water images of the brain did not show strokes bleed or any edema.  Protein was elevated when he was in the and this can the cerebrospinal fluid.  There was no elevated or low glucose however.  The diagnosis remains encephalopathy also based on an EEG which was very slow.  He had a tiny 4 mm stroke in the right PCA distribution that could not have been possibly what caused all these symptoms.  It was also evident that he has a history of emphysema.  Over time he developed more more psychosis he was constantly messed laying on things touching grabbing or holding on and his wife reports that he developed night terrors.  For her own safety and his he went to rehab he was continuously hearing or seeing things that were not there.  In conversations he would mention meals that the family had supposedly taken but were not truly happening.  He broke while in rehab his hip in the fall developed urosepsis and finally his wife decided to take him home as he had been in a fetal position did not eat well and seemed not to be verbal at all.  Now at home he has recovered his weight has a  good appetite he sits erect in a wheelchair he does have incontinence but he can feed himself and he is looking well-groomed.  The EEG from July 1 of this year was still mild to moderate slow with occasionally triphasic discharges.  He has been on Sinemet  25 over 100 mg and takes this at 9 AM 3 PM and 9 PM, and he has a nutritional supplement in form of Ensure or boost to give him enough protein.  Advil  and Tylenol  I used if he has pain, Flonase  for allergic nasal obstruction.  He had occupational therapy speech therapy and physical therapy twice a week  Today he scored 13 out of 30 on a Mini-Mental status examination and it is clear that his memory is still impaired that it is hard for him to hold onto a salt so why he was partially oriented to place his orientation to time was completely absent and he was not able to do attention  calculation or reverse spelling.  He also has great difficulties to copy an image or write a sentence I do wonder if his vision is significantly impaired.   He clearly has a depth procession problem- visio spatial and he stares off int the distance. He stares off. He is able to follow TV shows, regarding cars, cooking and Home improvement.       Review of Systems: Out of a complete 14 system review, the patient complains of only the following symptoms, and all other reviewed systems are negative.:   mini-mental status exam  13/ 30 .       Social History   Socioeconomic History   Marital status: Married    Spouse name: Not on file   Number of children: Not on file   Years of education: Not on file   Highest education level: Not on file  Occupational History   Not on file  Tobacco Use   Smoking status: Every Day    Current packs/day: 0.50    Types: Cigarettes   Smokeless tobacco: Not on file  Substance and Sexual Activity   Alcohol use: Never   Drug use: Never   Sexual activity: Not on file  Other Topics Concern   Not on file  Social History Narrative   Lives with wife    Pt retired    Chief Executive Officer Drivers of Corporate investment banker Strain: Low Risk  (10/20/2022)   Received from [facility]   Overall Financial Resource Strain (CARDIA)    Difficulty of Paying Living Expenses: Not hard at all  Food Insecurity: No Food Insecurity (11/07/2023)   Hunger Vital Sign    Worried About Running Out of Food in the Last Year: Never true    Ran Out of Food in the Last Year: Never true  Transportation Needs: No Transportation Needs (11/07/2023)   PRAPARE - Administrator, Civil Service (Medical): No    Lack of Transportation (Non-Medical): No  Physical Activity: Unknown (10/20/2022)   [facility]   Exercise Vital Sign    On average, how many days per week do you engage in moderate to strenuous exercise (like a brisk walk)?: Patient declined    On  average, how many minutes do you engage in exercise at this level?: 30 min  Stress: Patient Declined (10/20/2022)   Received from [facility] of Occupational Health - Occupational Stress Questionnaire    Feeling of Stress : Patient declined  Social Connections: Unknown (11/07/2023)  Social Advertising account executive    Frequency of Communication with Friends and Family: Once a week    Frequency of Social Gatherings with Friends and Family: Once a week    Attends Religious Services: Patient unable to answer    Active Member of Clubs or Organizations: Patient unable to answer    Attends Banker Meetings: Patient unable to answer    Marital Status: Married  Recent Concern: Social Connections - Moderately Isolated (10/24/2023)   Social Connection and Isolation Panel    Frequency of Communication with Friends and Family: More than three times a week    Frequency of Social Gatherings with Friends and Family: Three times a week    Attends Religious Services: Never    Active Member of Clubs or Organizations: No    Attends Banker Meetings: Never    Marital Status: Married    Family History  Problem Relation Age of Onset   Dementia Mother    Parkinsonism Father    Stroke Father    Breast cancer Sister     Past Medical History:  Diagnosis Date   Anemia    Dementia (HCC)    Stroke [facility])     Past Surgical History:  Procedure Laterality Date   INTRAMEDULLARY (IM) NAIL INTERTROCHANTERIC Right 09/26/2023   Procedure: INTRAMEDULLARY (IM) NAIL INTERTROCHANTERIC;  Surgeon: Kendal Franky SQUIBB, MD;  Location: [facility] OR;  Service: Orthopedics;  Laterality: Right;   ORIF FEMUR FRACTURE Right 10/24/2023   Procedure: OPEN REDUCTION INTERNAL FIXATION FEMORAL SHAFT FRACTURE;  Surgeon: Kendal Franky SQUIBB, MD;  Location: [facility] OR;  Service: Orthopedics;  Laterality: Right;     Current Outpatient Medications on File Prior to Visit  Medication Sig Dispense Refill    acetaminophen  (TYLENOL ) 650 MG suppository Place 650 mg rectally every 6 (six) hours as needed for fever (or pain).     Carbidopa -Levodopa  ER (SINEMET  CR) 25-100 MG tablet controlled release Take 1 tablet by mouth See admin instructions. Take 1 tablet by mouth at 9 AM, 3 PM, and 9 PM     feeding supplement (ENSURE ENLIVE / ENSURE PLUS) LIQD Take 237 mLs by mouth 2 (two) times daily between meals.     fluticasone  (FLONASE ) 50 MCG/ACT nasal spray Place 1 spray into both nostrils daily. (Patient taking differently: Place 1 spray into both nostrils daily as needed for allergies.)     ibuprofen  (ADVIL ) 200 MG tablet Take 1 tablet (200 mg total) by mouth every 12 (twelve) hours as needed. 30 tablet 0   memantine  (NAMENDA ) 5 MG tablet Take 5 mg by mouth 2 (two) times daily.     NUTRITIONAL SUPPLEMENTS PO Take 120 mLs by mouth See admin instructions. MedPass: Drink 120 ml's by mouth at 9 AM, 3 PM, and 9 PM     No current facility-administered medications on file prior to visit.    Allergies  Allergen Reactions   Fentanyl  And Related Anaphylaxis   Other Other (See Comments)    According to the patient, all opioids cause hallucinations    Prednisone  Other (See Comments)    Hallucinations - Allergic, per [facility]   Codeine Other (See Comments)    Hallucinations - Allergic, per [facility]   Morphine  Other (See Comments)    Hallucinations - Allergic, per [facility]   Oxycodone Other (See Comments)    Hallucinations - Allergic, per [facility]     DIAGNOSTIC DATA (LABS, IMAGING, TESTING) - I reviewed patient records, labs, notes, testing and imaging myself where  available.  Lab Results  Component Value Date   WBC 7.9 11/09/2023   HGB 9.1 (L) 11/09/2023   HCT 30.6 (L) 11/09/2023   MCV 108.1 (H) 11/09/2023   PLT 362 11/09/2023      Component Value Date/Time   NA 146 (H) 11/11/2023 0511   NA 138 07/03/2021 1539   K 3.4 (L) 11/11/2023 0511   CL 116 (H) 11/11/2023 0511   CO2 25 11/11/2023 0511   GLUCOSE 107  (H) 11/11/2023 0511   BUN 23 11/11/2023 0511   BUN 19 07/03/2021 1539   CREATININE 0.89 11/11/2023 0511   CALCIUM  8.1 (L) 11/11/2023 0511   CALCIUM  9.0 10/02/2023 0000   PROT 6.0 (L) 11/07/2023 1215   PROT 6.4 07/03/2021 1539   ALBUMIN 2.7 (L) 11/07/2023 1215   ALBUMIN 4.3 07/03/2021 1539   AST 37 11/07/2023 1215   ALT 27 11/07/2023 1215   ALKPHOS 76 11/07/2023 1215   BILITOT 0.8 11/07/2023 1215   BILITOT 0.3 07/03/2021 1539   GFRNONAA >60 11/11/2023 0511   Lab Results  Component Value Date   CHOL 189 07/19/2023   HDL 51 07/19/2023   LDLCALC 128 (H) 07/19/2023   TRIG 48 07/19/2023   CHOLHDL 3.7 07/19/2023   Lab Results  Component Value Date   HGBA1C 5.8 (H) 07/19/2023   Lab Results  Component Value Date   VITAMINB12 423 10/24/2023   Lab Results  Component Value Date   TSH 1.99 10/02/2023    PHYSICAL EXAM:  Vitals:   02/19/24 0829  BP: 120/64  Pulse: 80   No data found. Body mass index is 18.47 kg/m.   Wt Readings from Last 3 Encounters:  02/19/24 111 lb (50.3 kg)  01/22/24 111 lb (50.3 kg)  10/24/23 129 lb 10.1 oz (58.8 kg)     Ht Readings from Last 3 Encounters:  02/19/24 5' 5 (1.651 m)  01/22/24 5' 5 (1.651 m)  10/24/23 5' 6 (1.676 m)      General: The patient is awake, alert and appears not in acute distress. The patient is groomed. Head: Normocephalic, atraumatic. Neck is stiff -   Overbite Dwan is  seen.  Dental status: edentulous  Cardiovascular:  Regular rate and cardiac rhythm by pulse, without distended neck veins. Respiratory: Lungs are clear to auscultation.  Skin:  Without evidence of ankle edema, or rash. Trunk:     NEUROLOGIC EXAM: The patient is awake but not alert,  not fully oriented to place and time.   Memory :     02/19/2024    8:26 AM 01/22/2024    3:28 PM 02/01/2022   11:53 AM 07/03/2021    2:26 PM  MMSE - Mini Mental State Exam  Orientation to time 0 0 3 5  Orientation to Place 4 3 5 4   Registration 2  3 3 3   Attention/ Calculation 0 1 1 2   Recall 0 1 2 1   Language- name 2 objects 2 2 2 2   Language- repeat 1 1 1 1   Language- follow 3 step command 3 3 3 3   Language- read & follow direction 1 0 1 1  Write a sentence 0 0 1 1  Copy design 0 0 0 0  Total score 13 14 22 23      Attention span & concentration ability appears normal.  Speech is  non-fluent, dysarthria, dysphonia or aphasia.  Mood and affect - apathic    Cranial nerves:  loss of smell . Pupils are equal and briskly  reactive to light.   Extraocular movements in vertical planes were intact and without nystagmus. The horizontal eye movements were impaired,  he just could follow a movement to the left or right, gaze arrested in midline.  No Diplopia. Visual fields by finger perimetry : unable to test.  Hearing was intact to soft voice and finger rubbing.    Facial sensation intact to fine touch.  Facial motor strength is symmetric and tongue and uvula move midline.  No tremor of the tongue    Motor exam:   Rigor- rigid  tone with cog wheeling, symmetric grip strength .   Sensory:  deferred.   Coordination: Rapid alternating movements had to be deferred.  no TREMOR   Gait and station: Wheelchair  Deep tendon reflexes: in the  upper and lower extremities are attenuated, failure to relax.  Babinski response was deferred.    ASSESSMENT AND PLAN :   73 y.o. year old male  here with:   Suspected Lewy body dementia , based on delirium in response to dopaminergic medication and based on previous parkinsonism.     1) Referral to [facility]  Dr Luke Louder , for skin biopsy LEWY BODY dementia - strong family history, maternal. Night active, visual hallucinations.    2) PET scan , metabolic - ATN tests positive but strongly positive for  light filaments.   3)  CSF was benign- higher protein but no leukocytosis, not viral , not bacterial- no PCR was felt necessary.   4) MRI showed a small punctate lesion, this is too  small to explain any symptom or his current development.   5) The last CMET was highly abnormal, with failing renal function, dehydration and with anemia, low protein. I like to repeat this. CBC too, SPEP and ANA.    Plan NUPLAZID . The new guidelines do not  endorse a titration process. 35 or 43 mg dose.       The patient's condition requires frequent monitoring and adjustments in the treatment plan, reflecting the ongoing complexity of care.  This provider is the continuing focal point for all needed services for this condition.  After spending a total time of  35  minutes face to face and time for  history taking, physical and neurologic examination, review of laboratory studies,  personal review of imaging studies, reports and results of other testing and review of referral information / records as far as provided in visit,   Electronically signed by: Dedra Gores, MD 02/19/2024 8:41 AM  [facility] Board certified by The [employer] of Sleep Medicine and Diplomate of the [employer] of Sleep Medicine. Board certified In Neurology through the ABPN, Fellow of the [employer] of Neurology.

## 2024-02-19 NOTE — Telephone Encounter (Signed)
 Referral for neurology fax to [facility] to see Ernest Louder, MD. Phone: [phone], Fax: [phone].

## 2024-02-19 NOTE — Patient Instructions (Addendum)
 73 y.o. year old male  here with:     Suspected Lewy body dementia , based on delirium in response to dopaminergic medication and based on previous parkinsonism.      1) Referral to [facility] for skin biopsy LEWY BODY dementia - strong family history, maternal. Night active, visual hallucinations.     2) PET scan , metabolic - ATN tests positive but strongly positive for  light filaments.    3)  CSF was benign- higher protein but no leukocytosis, not viral , not bacterial- no PCR was felt necessary.    4) MRI showed a small punctate lesion, this is too small to explain any symptom or his current development.    5) The last CMET was highly abnormal, with failing renal function, dehydration and with anemia, low protein. I like to repeat this. CBC too, SPEP and ANA.      Plan NUPLAZID . The new guidelines do not  endorse a titration process. Starting at 35 or 43 mg dose. Once daily            Pimavanserin Capsules or Tablets What is this medication? PIMAVANSERIN (pi ma VAN ser in) treats hallucinations and delusions in people with Parkinson disease. It works by balancing substances in your brain that help regulate mood, behaviors, and thoughts. It does not help with body movements or coordination. It belongs to a group of medications called antipsychotics. This medicine may be used for other purposes; ask your health care provider or pharmacist if you have questions. COMMON BRAND NAME(S): NUPLAZID  What should I tell my care team before I take this medication? They need to know if you have any of these conditions: Dementia Heart disease History of irregular heartbeat Kidney disease Low levels of magnesium or potassium in the blood An unusual or allergic reaction to pimavanserin, other medications, foods, dyes, or preservatives Pregnant or trying to get pregnant Breast-feeding How should I use this medication? Take this medication by mouth with a glass of water. Follow the directions  on the prescription label. Swallow the capsules whole. You can take it with or without food. You may open the capsule and put the contents in 1 tablespoon of applesauce, yogurt, pudding, or a liquid nutritional supplement. Swallow the medication and food mixture right away. Do not chew the medication or food. Take your medication at regular intervals. Do not take it more often than directed. Do not stop taking except on your care team's advice. Talk to your care team about the use of this medication in children. This medication is not approved for use in children. Overdosage: If you think you have taken too much of this medicine contact a poison control center or emergency room at once. NOTE: This medicine is only for you. Do not share this medicine with others. What if I miss a dose? If you miss a dose, take it as soon as you can. If it is almost time for your next dose, take only that dose. Do not take double or extra doses. What may interact with this medication? Do not take this medication with any of the following: Certain medications for fungal infections like fluconazole , itraconazole, ketoconazole, posaconazole, voriconazole Cisapride Dronedarone Pimozide Thioridazine This medication may also interact with the following: Carbamazepine Certain antivirals for HIV or hepatitis like ritonavir Certain antibiotics like clarithromycin, gatifloxacin, moxifloxacin Certain medications for irregular heart beat like amiodarone, disopyramide, propafenone, quinidine, sotalol Fosphenytoin Modafinil Nafcillin Phenobarbital Primidone Phenytoin Other medications for psychotic disturbances Other medications that prolong the  QT interval (cause an abnormal heart rhythm) like dofetilide Rifampin St. John's Wort This list may not describe all possible interactions. Give your health care provider a list of all the medicines, herbs, non-prescription drugs, or dietary supplements you use. Also tell them if  you smoke, drink alcohol, or use illegal drugs. Some items may interact with your medicine. What should I watch for while using this medication? Visit your care team for regular checks on your progress. Tell your care team if your symptoms do not start to get better or if they get worse. What side effects may I notice from receiving this medication? Side effects that you should report to your doctor or health care professional as soon as possible: Allergic reactions--skin rash, itching, hives, swelling of the face, lips, tongue, or throat Heart rhythm changes--fast or irregular heartbeat, dizziness, feeling faint or lightheaded, chest pain, trouble breathing Side effects that usually do not require medical attention (report to your doctor or health care professional if they continue or are bothersome): Confusion Constipation Drowsiness Fatigue Nausea Swelling of the ankles, hands, or feet This list may not describe all possible side effects. Call your doctor for medical advice about side effects. You may report side effects to FDA at 1-800-FDA-1088. Where should I keep my medication? Keep out of the reach of children and pets. Store at room temperature between 20 and 25 degrees C (68 and 77 degrees F). Protect from light. Get rid of any unused medication after the expiration date. To get rid of medications that are no longer needed or have expired: Take the medication to a medication take-back program. Check with your pharmacy or law enforcement to find a location. If you cannot return the medication, check the label or package insert to see if the medication should be thrown out in the garbage or flushed down the toilet. If you are not sure, ask your care team. If it is safe to put it in the trash, empty the medication out of the container. Mix the medication with cat litter, dirt, coffee grounds, or other unwanted substance. Seal the mixture in a bag or container. Put it in the trash. NOTE:  This sheet is a summary. It may not cover all possible information. If you have questions about this medicine, talk to your doctor, pharmacist, or health care provider.  2024 Elsevier/Gold Standard (2021-06-23 00:00:00)

## 2024-02-20 ENCOUNTER — Other Ambulatory Visit: Payer: Self-pay

## 2024-02-20 ENCOUNTER — Telehealth: Payer: Self-pay | Admitting: Neurology

## 2024-02-20 MED ORDER — OLANZAPINE 2.5 MG PO TABS
2.5000 mg | ORAL_TABLET | Freq: Every day | ORAL | 0 refills | Status: DC
Start: 2024-02-20 — End: 2024-03-16

## 2024-02-20 NOTE — Telephone Encounter (Signed)
 Spoke w/Pt wife regarding new script for Nuplazid . Wife stated they do not have a medication plan and are not enrolled in Medicare Part D. When she spoke with the pharmacy she was told the medication would cost them thousands of dollars out of pocket. When wife inquired as to the actual cost, the pharmacy rep replied with I don't know. Wife is asking if there is an alternative medication. Informed wife will make provider aware and ask for recommendations.

## 2024-02-20 NOTE — Telephone Encounter (Signed)
 Pt's wife is asking for a call from RN to discuss the most recent medication called in for pt, she has concerns.

## 2024-02-20 NOTE — Telephone Encounter (Signed)
 Spoke w/Pt wife to make her aware of Dr. Lionell recommendation since Nuplazid  is so costly. Will send script to her pharmacy and will also send Good RX info in [facility] for her to get discount for Pt. Explained will need to give medication 1 to 2 weeks to notice effectiveness but to let us  know if there are negative effects. Wife voiced understanding and thanks for the call.

## 2024-02-21 LAB — COMPREHENSIVE METABOLIC PANEL WITH GFR
ALT: 11 IU/L (ref 0–44)
AST: 11 IU/L (ref 0–40)
Albumin: 4.2 g/dL (ref 3.9–4.9)
Alkaline Phosphatase: 140 IU/L — ABNORMAL HIGH (ref 44–121)
BUN/Creatinine Ratio: 27 — ABNORMAL HIGH (ref 10–24)
BUN: 25 mg/dL (ref 8–27)
Bilirubin Total: 0.2 mg/dL (ref 0.0–1.2)
CO2: 19 mmol/L — ABNORMAL LOW (ref 20–29)
Calcium: 9.4 mg/dL (ref 8.6–10.2)
Chloride: 103 mmol/L (ref 96–106)
Creatinine, Ser: 0.92 mg/dL (ref 0.76–1.27)
Globulin, Total: 2.4 g/dL (ref 1.5–4.5)
Glucose: 87 mg/dL (ref 70–99)
Potassium: 4.7 mmol/L (ref 3.5–5.2)
Sodium: 139 mmol/L (ref 134–144)
Total Protein: 6.6 g/dL (ref 6.0–8.5)
eGFR: 89 mL/min/1.73 (ref >59)

## 2024-02-21 LAB — PROTEIN ELECTROPHORESIS, SERUM
A/G Ratio: 1.3 (ref 0.7–1.7)
Albumin ELP: 3.7 g/dL (ref 2.9–4.4)
Alpha 1: 0.2 g/dL (ref 0.0–0.4)
Alpha 2: 0.7 g/dL (ref 0.4–1.0)
Beta: 0.9 g/dL (ref 0.7–1.3)
Gamma Globulin: 1 g/dL (ref 0.4–1.8)
Globulin, Total: 2.9 g/dL (ref 2.2–3.9)

## 2024-02-21 LAB — CBC WITH DIFFERENTIAL/PLATELET
Basophils Absolute: 0.1 x10E3/uL (ref 0.0–0.2)
Basos: 1 %
EOS (ABSOLUTE): 0.1 x10E3/uL (ref 0.0–0.4)
Eos: 2 %
Hematocrit: 37.7 % (ref 37.5–51.0)
Hemoglobin: 12 g/dL — ABNORMAL LOW (ref 13.0–17.7)
Immature Grans (Abs): 0 x10E3/uL (ref 0.0–0.1)
Immature Granulocytes: 0 %
Lymphocytes Absolute: 2.5 x10E3/uL (ref 0.7–3.1)
Lymphs: 35 %
MCH: 31.5 pg (ref 26.6–33.0)
MCHC: 31.8 g/dL (ref 31.5–35.7)
MCV: 99 fL — ABNORMAL HIGH (ref 79–97)
Monocytes Absolute: 1 x10E3/uL — ABNORMAL HIGH (ref 0.1–0.9)
Monocytes: 13 %
Neutrophils Absolute: 3.5 x10E3/uL (ref 1.4–7.0)
Neutrophils: 48 %
Platelets: 338 x10E3/uL (ref 150–450)
RBC: 3.81 x10E6/uL — ABNORMAL LOW (ref 4.14–5.80)
RDW: 13.3 % (ref 11.6–15.4)
WBC: 7.2 x10E3/uL (ref 3.4–10.8)

## 2024-02-21 LAB — RPR: RPR Ser Ql: NONREACTIVE

## 2024-02-21 LAB — METHYLMALONIC ACID, SERUM: Methylmalonic Acid: 171 nmol/L (ref 0–378)

## 2024-02-21 LAB — ANA W/REFLEX: Anti Nuclear Antibody (ANA): NEGATIVE

## 2024-02-21 LAB — VITAMIN B12: Vitamin B-12: 306 pg/mL (ref 232–1245)

## 2024-02-21 LAB — SEDIMENTATION RATE: Sed Rate: 8 mm/h (ref 0–30)

## 2024-02-21 LAB — TSH+FREE T4
Free T4: 1.08 ng/dL (ref 0.82–1.77)
TSH: 1.8 u[IU]/mL (ref 0.450–4.500)

## 2024-02-23 ENCOUNTER — Ambulatory Visit: Payer: Self-pay | Admitting: Neurology

## 2024-02-24 ENCOUNTER — Telehealth: Payer: Self-pay | Admitting: Neurology

## 2024-02-24 NOTE — Telephone Encounter (Signed)
 Pt's wife called stating that since the pt started OLANZapine  (ZYPREXA ) 2.5 MG tablet he has been sleeping for quite some time. Pt started Friday night and took second dose on Sat and pt is still sleeping since then. She spoke to the pharmacist and they informed her to not give next dose to pt and to call provider. She would like nurse to call back as soon as she can to discuss.

## 2024-02-24 NOTE — Telephone Encounter (Signed)
 Samule, nurse w/[facility], cld regarding Pt as she had made a visit today. Stated same info wife had reported earlier. RN stated wife adamant about not calling 911. Pt is not obtunded but is sleeping continuously and not waking up to eat or drink, since taking the 2nd dose of olanzapine  on Sat at 8pm. RN reported Pt does move away when she is performing wound care and VS are WNL but he is asleep. RN reported wife had cld a mobile IV service and they will be administering IV fluids (banana bag) since Pt is not eating or drinking. RN stated she will visit the Pt again tomorrow to see if he is more awake/alert after receiving the IV today and will call [facility] to report Pt status. Provider made aware.

## 2024-02-24 NOTE — Telephone Encounter (Signed)
 Called the wife back she states that on fri night she gave him the first dose of the medication around 7:30 pm. She states that he slept until Saturday at 12:30 pm. She didn't think anything of it because he had such a busy week she thought he was playing catch up. On Saturday she gave the pt 2nd dose around 7:30 pm. He slept through Sunday and is still sleeping. He is incontinent so the wife states when she changes him he arouses for a bit but then falls back asleep. Denied fever. She states he has a home health nurse coming in today and so she will assess. Advised she should hold additional doses of the medication for now and if nurse has any concerns when assessing to let us  know.  Informed her to attempt to keep stimulating him to wake him and try to work with him on fluid intake to help with hydration during the times he awakes. She verbalized understanding and will update us  if anything else changes. Advised I will route this to Dr Chalice for her to review as well.

## 2024-02-25 ENCOUNTER — Other Ambulatory Visit: Payer: Self-pay

## 2024-02-25 ENCOUNTER — Other Ambulatory Visit (HOSPITAL_COMMUNITY)

## 2024-02-25 ENCOUNTER — Emergency Department (HOSPITAL_COMMUNITY)

## 2024-02-25 ENCOUNTER — Encounter (HOSPITAL_COMMUNITY): Payer: Self-pay | Admitting: Emergency Medicine

## 2024-02-25 ENCOUNTER — Inpatient Hospital Stay (HOSPITAL_COMMUNITY)
Admission: EM | Admit: 2024-02-25 | Discharge: 2024-03-16 | DRG: 091 | Disposition: A | Discharge status: Home Health | Attending: Family Medicine | Admitting: Family Medicine

## 2024-02-25 ENCOUNTER — Observation Stay (HOSPITAL_COMMUNITY)

## 2024-02-25 DIAGNOSIS — E860 Dehydration: Secondary | ICD-10-CM | POA: Diagnosis present

## 2024-02-25 DIAGNOSIS — R040 Epistaxis: Secondary | ICD-10-CM | POA: Diagnosis not present

## 2024-02-25 DIAGNOSIS — F17210 Nicotine dependence, cigarettes, uncomplicated: Secondary | ICD-10-CM | POA: Diagnosis present

## 2024-02-25 DIAGNOSIS — Z885 Allergy status to narcotic agent status: Secondary | ICD-10-CM

## 2024-02-25 DIAGNOSIS — Z79899 Other long term (current) drug therapy: Secondary | ICD-10-CM

## 2024-02-25 DIAGNOSIS — I739 Peripheral vascular disease, unspecified: Secondary | ICD-10-CM | POA: Diagnosis present

## 2024-02-25 DIAGNOSIS — T43595A Adverse effect of other antipsychotics and neuroleptics, initial encounter: Secondary | ICD-10-CM | POA: Diagnosis present

## 2024-02-25 DIAGNOSIS — Z8673 Personal history of transient ischemic attack (TIA), and cerebral infarction without residual deficits: Secondary | ICD-10-CM

## 2024-02-25 DIAGNOSIS — D649 Anemia, unspecified: Secondary | ICD-10-CM | POA: Diagnosis present

## 2024-02-25 DIAGNOSIS — E876 Hypokalemia: Secondary | ICD-10-CM | POA: Diagnosis not present

## 2024-02-25 DIAGNOSIS — Z1152 Encounter for screening for COVID-19: Secondary | ICD-10-CM

## 2024-02-25 DIAGNOSIS — Z7401 Bed confinement status: Secondary | ICD-10-CM

## 2024-02-25 DIAGNOSIS — Z681 Body mass index (BMI) 19 or less, adult: Secondary | ICD-10-CM

## 2024-02-25 DIAGNOSIS — L89620 Pressure ulcer of left heel, unstageable: Secondary | ICD-10-CM | POA: Diagnosis present

## 2024-02-25 DIAGNOSIS — E872 Acidosis, unspecified: Secondary | ICD-10-CM | POA: Diagnosis present

## 2024-02-25 DIAGNOSIS — G3183 Dementia with Lewy bodies: Secondary | ICD-10-CM | POA: Diagnosis present

## 2024-02-25 DIAGNOSIS — G928 Other toxic encephalopathy: Principal | ICD-10-CM | POA: Diagnosis present

## 2024-02-25 DIAGNOSIS — M86272 Subacute osteomyelitis, left ankle and foot: Secondary | ICD-10-CM | POA: Diagnosis present

## 2024-02-25 DIAGNOSIS — R338 Other retention of urine: Secondary | ICD-10-CM | POA: Diagnosis not present

## 2024-02-25 DIAGNOSIS — Z803 Family history of malignant neoplasm of breast: Secondary | ICD-10-CM

## 2024-02-25 DIAGNOSIS — G20A1 Parkinson's disease without dyskinesia, without mention of fluctuations: Secondary | ICD-10-CM | POA: Diagnosis present

## 2024-02-25 DIAGNOSIS — R569 Unspecified convulsions: Secondary | ICD-10-CM | POA: Diagnosis not present

## 2024-02-25 DIAGNOSIS — A419 Sepsis, unspecified organism: Secondary | ICD-10-CM

## 2024-02-25 DIAGNOSIS — G9341 Metabolic encephalopathy: Secondary | ICD-10-CM | POA: Diagnosis present

## 2024-02-25 DIAGNOSIS — R627 Adult failure to thrive: Secondary | ICD-10-CM | POA: Diagnosis present

## 2024-02-25 DIAGNOSIS — R4182 Altered mental status, unspecified: Principal | ICD-10-CM | POA: Diagnosis present

## 2024-02-25 DIAGNOSIS — R509 Fever, unspecified: Secondary | ICD-10-CM

## 2024-02-25 DIAGNOSIS — R1310 Dysphagia, unspecified: Secondary | ICD-10-CM | POA: Diagnosis not present

## 2024-02-25 DIAGNOSIS — F0280 Dementia in other diseases classified elsewhere without behavioral disturbance: Secondary | ICD-10-CM | POA: Diagnosis present

## 2024-02-25 DIAGNOSIS — Z823 Family history of stroke: Secondary | ICD-10-CM

## 2024-02-25 DIAGNOSIS — E870 Hyperosmolality and hypernatremia: Secondary | ICD-10-CM | POA: Diagnosis not present

## 2024-02-25 DIAGNOSIS — N401 Enlarged prostate with lower urinary tract symptoms: Secondary | ICD-10-CM | POA: Diagnosis present

## 2024-02-25 DIAGNOSIS — E43 Unspecified severe protein-calorie malnutrition: Secondary | ICD-10-CM | POA: Diagnosis present

## 2024-02-25 DIAGNOSIS — R402421 Glasgow coma scale score 9-12, in the field [EMT or ambulance]: Principal | ICD-10-CM

## 2024-02-25 DIAGNOSIS — M869 Osteomyelitis, unspecified: Secondary | ICD-10-CM

## 2024-02-25 DIAGNOSIS — Z993 Dependence on wheelchair: Secondary | ICD-10-CM

## 2024-02-25 DIAGNOSIS — L89152 Pressure ulcer of sacral region, stage 2: Secondary | ICD-10-CM | POA: Diagnosis present

## 2024-02-25 DIAGNOSIS — Z515 Encounter for palliative care: Secondary | ICD-10-CM

## 2024-02-25 DIAGNOSIS — L8990 Pressure ulcer of unspecified site, unspecified stage: Secondary | ICD-10-CM | POA: Insufficient documentation

## 2024-02-25 DIAGNOSIS — Z66 Do not resuscitate: Secondary | ICD-10-CM | POA: Diagnosis not present

## 2024-02-25 DIAGNOSIS — Z888 Allergy status to other drugs, medicaments and biological substances status: Secondary | ICD-10-CM

## 2024-02-25 LAB — URINALYSIS, ROUTINE W REFLEX MICROSCOPIC
Bilirubin Urine: NEGATIVE
Glucose, UA: NEGATIVE mg/dL
Hgb urine dipstick: NEGATIVE
Ketones, ur: NEGATIVE mg/dL
Leukocytes,Ua: NEGATIVE
Nitrite: NEGATIVE
Protein, ur: NEGATIVE mg/dL
Specific Gravity, Urine: 1.024 (ref 1.005–1.030)
pH: 5 (ref 5.0–8.0)

## 2024-02-25 LAB — CBC WITH DIFFERENTIAL/PLATELET
Abs Immature Granulocytes: 0.03 K/uL (ref 0.00–0.07)
Basophils Absolute: 0.1 K/uL (ref 0.0–0.1)
Basophils Relative: 1 %
Eosinophils Absolute: 0 K/uL (ref 0.0–0.5)
Eosinophils Relative: 0 %
HCT: 40.2 % (ref 39.0–52.0)
Hemoglobin: 13 g/dL (ref 13.0–17.0)
Immature Granulocytes: 0 %
Lymphocytes Relative: 11 %
Lymphs Abs: 1.2 K/uL (ref 0.7–4.0)
MCH: 31.2 pg (ref 26.0–34.0)
MCHC: 32.3 g/dL (ref 30.0–36.0)
MCV: 96.4 fL (ref 80.0–100.0)
Monocytes Absolute: 1.4 K/uL — ABNORMAL HIGH (ref 0.1–1.0)
Monocytes Relative: 14 %
Neutro Abs: 7.8 K/uL — ABNORMAL HIGH (ref 1.7–7.7)
Neutrophils Relative %: 74 %
Platelets: 295 K/uL (ref 150–400)
RBC: 4.17 MIL/uL — ABNORMAL LOW (ref 4.22–5.81)
RDW: 14 % (ref 11.5–15.5)
WBC: 10.6 K/uL — ABNORMAL HIGH (ref 4.0–10.5)
nRBC: 0 % (ref 0.0–0.2)

## 2024-02-25 LAB — I-STAT VENOUS BLOOD GAS, ED
Acid-Base Excess: 0 mmol/L (ref 0.0–2.0)
Bicarbonate: 24 mmol/L (ref 20.0–28.0)
Calcium, Ion: 1.12 mmol/L — ABNORMAL LOW (ref 1.15–1.40)
HCT: 36 % — ABNORMAL LOW (ref 39.0–52.0)
Hemoglobin: 12.2 g/dL — ABNORMAL LOW (ref 13.0–17.0)
O2 Saturation: 99 %
Potassium: 4.4 mmol/L (ref 3.5–5.1)
Sodium: 142 mmol/L (ref 135–145)
TCO2: 25 mmol/L (ref 22–32)
pCO2, Ven: 38.1 mmHg — ABNORMAL LOW (ref 44–60)
pH, Ven: 7.408 (ref 7.25–7.43)
pO2, Ven: 124 mmHg — ABNORMAL HIGH (ref 32–45)

## 2024-02-25 LAB — RESP PANEL BY RT-PCR (RSV, FLU A&B, COVID)  RVPGX2
Influenza A by PCR: NEGATIVE
Influenza B by PCR: NEGATIVE
Resp Syncytial Virus by PCR: NEGATIVE
SARS Coronavirus 2 by RT PCR: NEGATIVE

## 2024-02-25 LAB — COMPREHENSIVE METABOLIC PANEL WITH GFR
ALT: 19 U/L (ref 0–44)
AST: 50 U/L — ABNORMAL HIGH (ref 15–41)
Albumin: 3.3 g/dL — ABNORMAL LOW (ref 3.5–5.0)
Alkaline Phosphatase: 79 U/L (ref 38–126)
Anion gap: 12 (ref 5–15)
BUN: 36 mg/dL — ABNORMAL HIGH (ref 8–23)
CO2: 23 mmol/L (ref 22–32)
Calcium: 9.3 mg/dL (ref 8.9–10.3)
Chloride: 106 mmol/L (ref 98–111)
Creatinine, Ser: 1.05 mg/dL (ref 0.61–1.24)
GFR, Estimated: >60 mL/min (ref >60)
Glucose, Bld: 116 mg/dL — ABNORMAL HIGH (ref 70–99)
Potassium: 4.6 mmol/L (ref 3.5–5.1)
Sodium: 141 mmol/L (ref 135–145)
Total Bilirubin: 1.2 mg/dL (ref 0.0–1.2)
Total Protein: 6.7 g/dL (ref 6.5–8.1)

## 2024-02-25 LAB — I-STAT CG4 LACTIC ACID, ED
Lactic Acid, Venous: 2.7 mmol/L (ref 0.5–1.9)
Lactic Acid, Venous: 3.4 mmol/L (ref 0.5–1.9)

## 2024-02-25 LAB — RAPID URINE DRUG SCREEN, HOSP PERFORMED
Amphetamines: NOT DETECTED
Barbiturates: NOT DETECTED
Benzodiazepines: NOT DETECTED
Cocaine: NOT DETECTED
Opiates: NOT DETECTED
Tetrahydrocannabinol: NOT DETECTED

## 2024-02-25 LAB — AMMONIA: Ammonia: <13 umol/L (ref 9–35)

## 2024-02-25 MED ORDER — ALBUTEROL SULFATE (2.5 MG/3ML) 0.083% IN NEBU
2.5000 mg | INHALATION_SOLUTION | RESPIRATORY_TRACT | Status: DC | PRN
  Administered 2024-02-26: 2.5 mg via RESPIRATORY_TRACT
  Filled 2024-02-25: qty 3

## 2024-02-25 MED ORDER — ONDANSETRON HCL 4 MG PO TABS: 4.0000 mg | ORAL_TABLET | Freq: Four times a day (QID) | ORAL | Status: DC | PRN

## 2024-02-25 MED ORDER — SODIUM CHLORIDE 0.9 % IV SOLN: INTRAVENOUS | Status: DC

## 2024-02-25 MED ORDER — GADOBUTROL 1 MMOL/ML IV SOLN
5.0000 mL | Freq: Once | INTRAVENOUS | Status: AC | PRN
  Administered 2024-02-25: 5 mL via INTRAVENOUS

## 2024-02-25 MED ORDER — TRAMADOL HCL 50 MG PO TABS: 50.0000 mg | ORAL_TABLET | Freq: Three times a day (TID) | ORAL | Status: DC | PRN

## 2024-02-25 MED ORDER — ONDANSETRON HCL 4 MG/2ML IJ SOLN: 4.0000 mg | Freq: Four times a day (QID) | INTRAMUSCULAR | Status: DC | PRN

## 2024-02-25 MED ORDER — POLYETHYLENE GLYCOL 3350 17 G PO PACK
17.0000 g | PACK | Freq: Every day | ORAL | Status: DC | PRN
  Filled 2024-02-25: qty 1

## 2024-02-25 MED ORDER — ENOXAPARIN SODIUM 30 MG/0.3ML IJ SOSY
30.0000 mg | PREFILLED_SYRINGE | Freq: Every day | INTRAMUSCULAR | Status: DC
  Administered 2024-02-26 (×2): 30 mg via SUBCUTANEOUS
  Filled 2024-02-25 (×2): qty 0.3

## 2024-02-25 MED ORDER — LACTATED RINGERS IV BOLUS
1000.0000 mL | Freq: Once | INTRAVENOUS | Status: AC
  Administered 2024-02-25: 1000 mL via INTRAVENOUS

## 2024-02-25 NOTE — Consult Note (Signed)
 NEUROLOGY CONSULT NOTE   Date of service: February 25, 2024 Patient Name: Ernest Campbell MRN:  [id number] DOB:  09/11/1950 Chief Complaint: AMS Requesting Provider: Donnamarie Lebron PARAS, MD  History of Present Illness  Ernest Campbell is a 73 y.o. 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 According to the wife, he was getting somewhat better after discontinuation of the dopaminergic medications but then started taking Zyprexa  on 02/21/2024 and only received 2 doses after which she has become unresponsive, nonverbal and has had a very sudden decline from his baseline. She reports that he had a seizure although I do not find a record of him having seizures. Patient was seen by the inpatient neurology service in December for headache and confusion with marked improvement on antibiotics likely for UTI but was also tested for CNS infection with a bland CSF result. His multiple deliriogenic medications at that time were also reduced and he showed improvement. From the story that I hear from the family seems like that he has really had a pretty steady decline over the past few months to a year and has not really returned back to baseline with now symptoms of delirium and almost psychosis being more prominent than before.    ROS  Comprehensive ROS performed and pertinent positives documented in HPI   Past History   Past Medical History:  Diagnosis Date   Anemia    Dementia (HCC)    Stroke [facility])     Past Surgical History:  Procedure Laterality Date   INTRAMEDULLARY (IM) NAIL INTERTROCHANTERIC Right  09/26/2023   Procedure: INTRAMEDULLARY (IM) NAIL INTERTROCHANTERIC;  Surgeon: Kendal Franky SQUIBB, MD;  Location: [facility] OR;  Service: Orthopedics;  Laterality: Right;   ORIF FEMUR FRACTURE Right 10/24/2023   Procedure: OPEN REDUCTION INTERNAL FIXATION FEMORAL SHAFT FRACTURE;  Surgeon: Kendal Franky SQUIBB, MD;  Location: [facility] OR;  Service: Orthopedics;  Laterality: Right;    Family History: Family History  Problem Relation Age of Onset   Dementia Mother    Parkinsonism Father    Stroke Father    Breast cancer Sister     Social History  reports that he has been smoking cigarettes. He does not have any smokeless tobacco history on file. He reports that he does not drink alcohol and does not use drugs.  Allergies  Allergen Reactions   Fentanyl  And Related Anaphylaxis   Other Other (See Comments)    According to the patient, all opioids cause hallucinations    Prednisone  Other (See Comments)    Hallucinations - Allergic, per [facility]   Benadryl  [Diphenhydramine ] Other (See Comments)    hallucinations   Codeine Other (See Comments)    Hallucinations - Allergic, per [facility]   Morphine  Other (See Comments)    Hallucinations - Allergic, per [facility]   Oxycodone Other (See Comments)    Hallucinations - Allergic, per [facility]    Medications   Current Facility-Administered Medications:    0.9 %  sodium chloride  infusion, , Intravenous, Continuous, Ezenduka, Nkeiruka J, MD, Last Rate: 75 mL/hr at 02/25/24 1941, New Bag at 02/25/24 1941  Current Outpatient Medications:    memantine  (NAMENDA ) 5 MG tablet,  Take 5 mg by mouth 2 (two) times daily., Disp: , Rfl:    OLANZapine  (ZYPREXA ) 2.5 MG tablet, Take 1 tablet (2.5 mg total) by mouth at bedtime., Disp: 30 tablet, Rfl: 0   ALPRAZolam  (XANAX ) 0.5 MG tablet, Take 1 tablet (0.5 mg total) by mouth at bedtime as needed. (Patient not taking: Reported on 02/25/2024), Disp: 2 tablet, Rfl: 0  Vitals   Vitals:   02/25/24 1435 02/25/24 1715 02/25/24 1730 02/25/24 1858   BP:  132/71 (!) 140/78 (!) 171/75  Pulse:  85 84 93  Resp:  18 14 17   Temp: 98 F (36.7 C)   98 F (36.7 C)  TempSrc: Axillary   Axillary  SpO2:  100% 100% 100%  Weight:      Height:        Body mass index is 18.47 kg/m.   Physical Exam  General: Thin cachectic looking man HEENT: Normocephalic atraumatic Lungs: Clear Cardiovascular: Regular rhythm Neurological exam Appears awake and alert but does not respond to voice. Nonverbal, does not follow commands Cranial nerves: Pupils equal round react light, does not seem to have a gaze preference or deviation, blinks to threat from both sides, face appears grossly symmetric. Motor examination with increased tone and paratonia in all 4 extremities.  He has a very strong grasp reflex. Sensation: Strong grimace and withdrawal to noxious stimulation, without focality Coordination difficult to assess given his inability to follow commands  Labs/Imaging/Neurodiagnostic studies   CBC:  Recent Labs  Lab 02-24-2024 1005 02/25/24 1434 02/25/24 1654  WBC 7.2 10.6*  --   NEUTROABS 3.5 7.8*  --   HGB 12.0* 13.0 12.2*  HCT 37.7 40.2 36.0*  MCV 99* 96.4  --   PLT 338 295  --    Basic Metabolic Panel:  Lab Results  Component Value Date   NA 142 02/25/2024   K 4.4 02/25/2024   CO2 23 02/25/2024   GLUCOSE 116 (H) 02/25/2024   BUN 36 (H) 02/25/2024   CREATININE 1.05 02/25/2024   CALCIUM  9.3 02/25/2024   GFRNONAA >60 02/25/2024   Lipid Panel:  Lab Results  Component Value Date   LDLCALC 128 (H) 07/19/2023   HgbA1c:  Lab Results  Component Value Date   HGBA1C 5.8 (H) 07/19/2023   Urine Drug Screen:     Component Value Date/Time   LABOPIA NONE DETECTED 02/25/2024 1703   COCAINSCRNUR NONE DETECTED 02/25/2024 1703   LABBENZ NONE DETECTED 02/25/2024 1703   AMPHETMU NONE DETECTED 02/25/2024 1703   THCU NONE DETECTED 02/25/2024 1703   LABBARB NONE DETECTED 02/25/2024 1703    Alcohol Level     Component Value Date/Time    ETH <10 07/18/2023 1523   INR  Lab Results  Component Value Date   INR 1.3 (H) 11/07/2023   APTT  Lab Results  Component Value Date   APTT 27 11/07/2023  Lactate 2.7 UA unremarkable  Imaging personally reviewed CT head with no acute changes Chest x-ray unremarkable  ASSESSMENT   Ernest Campbell is a 73 y.o. 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 The only new thing and the past few days was addition of Zyprexa .  Symptoms could be related to progression of his dementing process versus abrupt mental status in the setting of new antipsychotic.  Impression: Altered mental  status-unclear etiology-broad differentials including toxic metabolic encephalopathy versus seizure in the setting of neurodegenerative disease versus progression of neurodegenerative process  RECOMMENDATIONS  At this time, he does not have any evidence of UTI or pneumonia.  He has mild lactic acidosis.  Family reports a prior history of seizure.  Will order EEG Will obtain MRI of the brain to ensure no acute process At this time, I do not have a very clear answer on his acute worsening.  Morning team to touch base with his outpatient neurologist to see if they have any other recommendations for inpatient workup. This was discussed with his family at bedside. Preliminary plan discussed by the daytime neurologist with the EDP calling in the consult. Neurology to follow   ______________________________________________________________________    Signed, Eligio Lav, MD [facility]

## 2024-02-25 NOTE — ED Notes (Signed)
 [facility] called.

## 2024-02-25 NOTE — ED Triage Notes (Signed)
 Per [facility] pt coming from home called out for altered mental status. Wife and home health nurse was at bedside stating patient was prescribed a dose of ativan  (per home health). According to neurology noted medication is zyprexa  and not ativan . States since getting that patient has been altered and non verbal. States patient has improved with NPA to right nare. Placed on 6L [place] with room air at 91%.

## 2024-02-25 NOTE — ED Provider Notes (Signed)
 [place] EMERGENCY DEPARTMENT AT [facility] Provider Note   CSN: [id number] Arrival date & time: 02/25/24  1421     Patient presents with: Altered Mental Status   Ernest Campbell is a 73 y.o. male.  {Add pertinent medical, surgical, social history, OB history to HPI:32947} HPI     73 year old male with history of CVA, dementia (under evaluation for ?Lewy Body), parkinsonism who presents with concern for altered mental status.  Wife reports that he is sleeping continuously, not waking up to eat or drink since taking his second dose of olanzapine  on Saturday at 8 PM.  Gave him the first dose of olanzapine  around 7:30 PM on Friday, he slept until Saturday at 12:30 PM and received the second dose at 7:30 PM on Saturday and has been sleepy ever since then.  He was given prescription on July 16 for Xanax , and the 17th olanzapine   Past Medical History:  Diagnosis Date   Anemia    Dementia (HCC)    Stroke [facility])     Prior to Admission medications   Medication Sig Start Date End Date Taking? Authorizing Provider  acetaminophen  (TYLENOL ) 650 MG suppository Place 650 mg rectally every 6 (six) hours as needed for fever (or pain).    [provider]  ALPRAZolam  (XANAX ) 0.5 MG tablet Take 1 tablet (0.5 mg total) by mouth at bedtime as needed. 02/19/24   Dohmeier, Dedra, MD  feeding supplement (ENSURE ENLIVE / ENSURE PLUS) LIQD Take 237 mLs by mouth 2 (two) times daily between meals. 10/29/23   Pokhrel, Laxman, MD  fluticasone  (FLONASE ) 50 MCG/ACT nasal spray Place 1 spray into both nostrils daily. Patient taking differently: Place 1 spray into both nostrils daily as needed for allergies. 10/01/23   Cindy Garnette POUR, MD  ibuprofen  (ADVIL ) 200 MG tablet Take 1 tablet (200 mg total) by mouth every 12 (twelve) hours as needed. 11/12/23   Jillian Buttery, MD  memantine  (NAMENDA ) 5 MG tablet Take 5 mg by mouth 2 (two) times daily.    [provider]  NUTRITIONAL  SUPPLEMENTS PO Take 120 mLs by mouth See admin instructions. MedPass: Drink 120 ml's by mouth at 9 AM, 3 PM, and 9 PM    [provider]  OLANZapine  (ZYPREXA ) 2.5 MG tablet Take 1 tablet (2.5 mg total) by mouth at bedtime. 02/20/24   Dohmeier, Dedra, MD    Allergies: Fentanyl  and related, Other, Prednisone , Codeine, Morphine , and Oxycodone    Review of Systems  Updated Vital Signs BP 136/69   Pulse 92   Temp 98 F (36.7 C) (Axillary)   Resp (!) 22   Ht 5' 5 (1.651 m)   Wt 50.3 kg   SpO2 100%   BMI 18.47 kg/m   Physical Exam  (all labs ordered are listed, but only abnormal results are displayed) Labs Reviewed  CBC WITH DIFFERENTIAL/PLATELET - Abnormal; Notable for the following components:      Result Value   WBC 10.6 (*)    RBC 4.17 (*)    Neutro Abs 7.8 (*)    Monocytes Absolute 1.4 (*)    All other components within normal limits  COMPREHENSIVE METABOLIC PANEL WITH GFR - Abnormal; Notable for the following components:   Glucose, Bld 116 (*)    BUN 36 (*)    Albumin 3.3 (*)    AST 50 (*)    All other components within normal limits  URINALYSIS, ROUTINE W REFLEX MICROSCOPIC    EKG: None  Radiology:  DG Chest Portable 1 View Result Date: 02/25/2024 CLINICAL DATA:  Altered mental status. EXAM: PORTABLE CHEST 1 VIEW COMPARISON:  Chest radiograph dated 11/07/2023. FINDINGS: No focal consolidation, pleural effusion or pneumothorax. The cardiac silhouette is within normal limits. No acute osseous pathology. IMPRESSION: No active disease. Electronically Signed   By: Vanetta Chou M.D.   On: 02/25/2024 14:43    {Document cardiac monitor, telemetry assessment procedure when appropriate:32947} Procedures   Medications Ordered in the ED - No data to display    {Click here for ABCD2, HEART and other calculators REFRESH Note before signing:1}                              Medical Decision Making  ***  {Document critical care time when appropriate  Document  review of labs and clinical decision tools ie CHADS2VASC2, etc  Document your independent review of radiology images and any outside records  Document your discussion with family members, caretakers and with consultants  Document social determinants of health affecting pt's care  Document your decision making why or why not admission, treatments were needed:32947:::1}   Final diagnoses:  None    ED Discharge Orders     None

## 2024-02-25 NOTE — H&P (Signed)
 History and Physical    Patient: Ernest Campbell FMW:[id number] DOB: 02-03-1951 DOA: 02/25/2024 DOS: the patient was seen and examined on 02/25/2024 PCP: Sallee Barks, MD (Inactive)  Patient coming from: Home  Chief Complaint:  Chief Complaint  Patient presents with   Altered Mental Status   HPI: Ernest Campbell is a 73 y.o. male with medical history significant for anemia, possible parkinsonism, suspected Lewy body dementia, presents to the ED due to AMS since 02/21/2024 after patient was started on Zyprexa  2.5 mg at bedtime.  Patient currently altered, initially very difficult to arouse.  Unable to provide any history.  Obtain history from wife at bedside.  Wife stated that prior to 7/18, patient was actually doing well above his baseline, had been walking with home health PT/OT/SLP.  Patient is wheelchair-bound but able to pivot from wheelchair to bed by himself, perform minimal ADLs like feeding, dressing.  Patient have been following up with neurology and was initially diagnosed with parkinsonism on levodopa .  In 08/2023 after patient received Ativan  for possible cluster headache, his parkinsonism symptoms progressed prompting suspicion for possible Lewy body dementia based on his delirious response to dopaminergic medication with a history of parkinsonism.  Neurology stopped his levodopa  and prescribed Zyprexa .  On 7/18, patient took the first dose of Zyprexa , on 7/19 the second dose was given, since 7/18, patient has literally been sleeping and snoring is described by the wife.  He has been minimally responsive, not able to tolerate anything orally.  Wife stated patient had no fever/chills, did not complain of any chest pain, abdominal pain, no nausea/vomiting, no seizures noted.  Due to his persistent altered state, wife decided to bring him to the ED.    In the ED, patient still noted to be altered, but now arousable, vital signs fairly stable.  Labs largely unremarkable except for  lactic acid of 2.7, BUN 36, creatinine 1.05, baseline around 0.8-0.9, respiratory panel negative, UA negative, chest x-ray unremarkable.  CT head, no acute intracranial abnormality.  MRI pending.  [facility] consulted for admission.  Requested EDP to consult neurology as well.     Review of Systems: As mentioned in the history of present illness. All other systems reviewed and are negative. Past Medical History:  Diagnosis Date   Anemia    Dementia (HCC)    Stroke [facility])    Past Surgical History:  Procedure Laterality Date   INTRAMEDULLARY (IM) NAIL INTERTROCHANTERIC Right 09/26/2023   Procedure: INTRAMEDULLARY (IM) NAIL INTERTROCHANTERIC;  Surgeon: Kendal Franky SQUIBB, MD;  Location: [facility] OR;  Service: Orthopedics;  Laterality: Right;   ORIF FEMUR FRACTURE Right 10/24/2023   Procedure: OPEN REDUCTION INTERNAL FIXATION FEMORAL SHAFT FRACTURE;  Surgeon: Kendal Franky SQUIBB, MD;  Location: [facility] OR;  Service: Orthopedics;  Laterality: Right;   Social History:  reports that he has been smoking cigarettes. He does not have any smokeless tobacco history on file. He reports that he does not drink alcohol and does not use drugs.  Allergies  Allergen Reactions   Fentanyl  And Related Anaphylaxis   Other Other (See Comments)    According to the patient, all opioids cause hallucinations    Prednisone  Other (See Comments)    Hallucinations - Allergic, per [facility]   Codeine Other (See Comments)    Hallucinations - Allergic, per [facility]   Morphine  Other (See Comments)    Hallucinations - Allergic, per [facility]   Oxycodone Other (See Comments)    Hallucinations - Allergic, per [facility]  Family History  Problem Relation Age of Onset   Dementia Mother    Parkinsonism Father    Stroke Father    Breast cancer Sister     Prior to Admission medications   Medication Sig Start Date End Date Taking? Authorizing Provider  acetaminophen  (TYLENOL ) 650 MG suppository Place 650 mg rectally every 6 (six) hours as  needed for fever (or pain).    [provider]  ALPRAZolam  (XANAX ) 0.5 MG tablet Take 1 tablet (0.5 mg total) by mouth at bedtime as needed. 02/19/24   Dohmeier, Dedra, MD  feeding supplement (ENSURE ENLIVE / ENSURE PLUS) LIQD Take 237 mLs by mouth 2 (two) times daily between meals. 10/29/23   Pokhrel, Laxman, MD  fluticasone  (FLONASE ) 50 MCG/ACT nasal spray Place 1 spray into both nostrils daily. Patient taking differently: Place 1 spray into both nostrils daily as needed for allergies. 10/01/23   Cindy Garnette POUR, MD  ibuprofen  (ADVIL ) 200 MG tablet Take 1 tablet (200 mg total) by mouth every 12 (twelve) hours as needed. 11/12/23   Jillian Buttery, MD  memantine  (NAMENDA ) 5 MG tablet Take 5 mg by mouth 2 (two) times daily.    [provider]  NUTRITIONAL SUPPLEMENTS PO Take 120 mLs by mouth See admin instructions. MedPass: Drink 120 ml's by mouth at 9 AM, 3 PM, and 9 PM    [provider]  OLANZapine  (ZYPREXA ) 2.5 MG tablet Take 1 tablet (2.5 mg total) by mouth at bedtime. 02/20/24   Dohmeier, Dedra, MD    Physical Exam: Vitals:   02/25/24 1429 02/25/24 1435 02/25/24 1715 02/25/24 1730  BP: 136/69  132/71 (!) 140/78  Pulse: 92  85 84  Resp: (!) 22  18 14   Temp:  98 F (36.7 C)    TempSrc:  Axillary    SpO2: 100%  100% 100%  Weight: 50.3 kg     Height: 5' 5 (1.651 m)      General: Altered, minimally arousable Cardiovascular: S1, S2 present Respiratory: CTAB Abdomen: Soft, nontender, nondistended, bowel sounds present Musculoskeletal: No bilateral pedal edema noted Skin: Noted sacral wound ulcer, left heel ulcer  Psychiatry: Unable to assess  Data Reviewed: As noted above  Assessment and Plan:  Acute metabolic/toxic encephalopathy Likely 2/2 medication induced, recently started on Zyprexa  2.5 mg at bedtime, received 2 doses on 7/18 and 7/19, patient had been noted to be sleeping, not arousable.  Prior to starting the medication, patient was at his  baseline Afebrile, with minimally elevated WBC 10.6 Workup has been largely unremarkable, respiratory panel negative, UA negative, chest x-ray unremarkable.  CT head, no acute intracranial abnormality BC x 2 pending EEG pending MRI pending Neurology consulted N.p.o. for now until more awake No indication for antibiotics Continue IV fluids Discontinue Zyprexa  Telemetry, monitor closely  Lactic acidosis Likely from poor oral intake, as patient has been sleeping for the past 4 days Minimally elevated creatinine from baseline Trend lactic acid Continue IV fluids  Parkinsonism/suspected Lewy body dementia Hold meds for now until able to tolerate orally       Advance Care Planning: Full code  Consults: Neurology  Family Communication: Wife at bedside  Severity of Illness: The appropriate patient status for this patient is OBSERVATION. Observation status is judged to be reasonable and necessary in order to provide the required intensity of service to ensure the patient's safety. The patient's presenting symptoms, physical exam findings, and initial radiographic and laboratory data in the context of their medical condition is felt  to place them at decreased risk for further clinical deterioration. Furthermore, it is anticipated that the patient will be medically stable for discharge from the hospital within 2 midnights of admission.   Author: Lebron JINNY Cage, MD 02/25/2024 6:30 PM  For on call review [email or web address].

## 2024-02-26 ENCOUNTER — Observation Stay (HOSPITAL_COMMUNITY)

## 2024-02-26 ENCOUNTER — Other Ambulatory Visit (HOSPITAL_COMMUNITY)

## 2024-02-26 DIAGNOSIS — Z7189 Other specified counseling: Secondary | ICD-10-CM | POA: Diagnosis not present

## 2024-02-26 DIAGNOSIS — R569 Unspecified convulsions: Secondary | ICD-10-CM | POA: Diagnosis not present

## 2024-02-26 DIAGNOSIS — R627 Adult failure to thrive: Secondary | ICD-10-CM | POA: Diagnosis not present

## 2024-02-26 DIAGNOSIS — M8610 Other acute osteomyelitis, unspecified site: Secondary | ICD-10-CM

## 2024-02-26 DIAGNOSIS — R4182 Altered mental status, unspecified: Secondary | ICD-10-CM | POA: Diagnosis not present

## 2024-02-26 LAB — BASIC METABOLIC PANEL WITH GFR
Anion gap: 10 (ref 5–15)
BUN: 35 mg/dL — ABNORMAL HIGH (ref 8–23)
CO2: 23 mmol/L (ref 22–32)
Calcium: 9.1 mg/dL (ref 8.9–10.3)
Chloride: 109 mmol/L (ref 98–111)
Creatinine, Ser: 0.88 mg/dL (ref 0.61–1.24)
GFR, Estimated: >60 mL/min (ref >60)
Glucose, Bld: 106 mg/dL — ABNORMAL HIGH (ref 70–99)
Potassium: 3.9 mmol/L (ref 3.5–5.1)
Sodium: 142 mmol/L (ref 135–145)

## 2024-02-26 LAB — CBC
HCT: 34.3 % — ABNORMAL LOW (ref 39.0–52.0)
Hemoglobin: 11.4 g/dL — ABNORMAL LOW (ref 13.0–17.0)
MCH: 32 pg (ref 26.0–34.0)
MCHC: 33.2 g/dL (ref 30.0–36.0)
MCV: 96.3 fL (ref 80.0–100.0)
Platelets: 253 K/uL (ref 150–400)
RBC: 3.56 MIL/uL — ABNORMAL LOW (ref 4.22–5.81)
RDW: 13.9 % (ref 11.5–15.5)
WBC: 8.8 K/uL (ref 4.0–10.5)
nRBC: 0 % (ref 0.0–0.2)

## 2024-02-26 LAB — VITAMIN B12: Vitamin B-12: 481 pg/mL (ref 180–914)

## 2024-02-26 LAB — TSH: TSH: 1.431 u[IU]/mL (ref 0.350–4.500)

## 2024-02-26 LAB — RAPID URINE DRUG SCREEN, HOSP PERFORMED
Amphetamines: NOT DETECTED
Barbiturates: NOT DETECTED
Benzodiazepines: NOT DETECTED
Cocaine: NOT DETECTED
Opiates: NOT DETECTED
Tetrahydrocannabinol: NOT DETECTED

## 2024-02-26 LAB — SEDIMENTATION RATE: Sed Rate: 39 mm/h — ABNORMAL HIGH (ref 0–16)

## 2024-02-26 LAB — LACTIC ACID, PLASMA: Lactic Acid, Venous: 1 mmol/L (ref 0.5–1.9)

## 2024-02-26 MED ORDER — COLLAGENASE 250 UNIT/GM EX OINT
TOPICAL_OINTMENT | Freq: Every day | CUTANEOUS | Status: DC
  Filled 2024-02-26 (×2): qty 30

## 2024-02-26 MED ORDER — THIAMINE MONONITRATE 100 MG PO TABS
200.0000 mg | ORAL_TABLET | Freq: Every day | ORAL | Status: DC
  Administered 2024-03-01 – 2024-03-02 (×2): 200 mg via ORAL
  Filled 2024-02-26 (×3): qty 2

## 2024-02-26 MED ORDER — GADOBUTROL 1 MMOL/ML IV SOLN
5.0000 mL | Freq: Once | INTRAVENOUS | Status: AC | PRN
  Administered 2024-02-26: 5 mL via INTRAVENOUS

## 2024-02-26 MED ORDER — ACETAMINOPHEN 650 MG RE SUPP
650.0000 mg | Freq: Four times a day (QID) | RECTAL | Status: DC | PRN
  Filled 2024-02-26: qty 1

## 2024-02-26 MED ORDER — THIAMINE HCL 100 MG/ML IJ SOLN: 250.0000 mg | Freq: Three times a day (TID) | INTRAVENOUS | Status: DC

## 2024-02-26 MED ORDER — ACETAMINOPHEN 325 MG PO TABS: 650.0000 mg | ORAL_TABLET | Freq: Four times a day (QID) | ORAL | Status: DC

## 2024-02-26 MED ORDER — THIAMINE MONONITRATE 100 MG PO TABS: 200.0000 mg | ORAL_TABLET | Freq: Every day | ORAL | Status: DC

## 2024-02-26 MED ORDER — THIAMINE HCL 100 MG/ML IJ SOLN
500.0000 mg | Freq: Three times a day (TID) | INTRAVENOUS | Status: DC
  Administered 2024-02-26: 500 mg via INTRAVENOUS
  Filled 2024-02-26: qty 500
  Filled 2024-02-26 (×3): qty 5

## 2024-02-26 MED ORDER — ACETAMINOPHEN 325 MG PO TABS
650.0000 mg | ORAL_TABLET | Freq: Four times a day (QID) | ORAL | Status: DC | PRN
  Administered 2024-03-01 – 2024-03-09 (×13): 650 mg via ORAL
  Filled 2024-02-26 (×13): qty 2

## 2024-02-26 MED ORDER — THIAMINE HCL 100 MG/ML IJ SOLN
500.0000 mg | Freq: Three times a day (TID) | INTRAVENOUS | Status: AC
  Administered 2024-02-27 – 2024-02-28 (×4): 500 mg via INTRAVENOUS
  Filled 2024-02-26: qty 466.67
  Filled 2024-02-26 (×2): qty 5
  Filled 2024-02-26: qty 500

## 2024-02-26 MED ORDER — DEXTROSE-SODIUM CHLORIDE 5-0.9 % IV SOLN: INTRAVENOUS | Status: AC

## 2024-02-26 MED ORDER — ACETAMINOPHEN 500 MG PO TABS: 1000.0000 mg | ORAL_TABLET | Freq: Four times a day (QID) | ORAL | Status: DC

## 2024-02-26 MED ORDER — THIAMINE HCL 100 MG/ML IJ SOLN
250.0000 mg | Freq: Three times a day (TID) | INTRAVENOUS | Status: AC
  Administered 2024-02-28 – 2024-02-29 (×5): 250 mg via INTRAVENOUS
  Filled 2024-02-26 (×5): qty 2.5

## 2024-02-26 NOTE — Progress Notes (Signed)
 PROGRESS NOTE  Ernest Campbell FMW:[id number] DOB: 1950-11-29   PCP: Pcp, No  Patient is from: Home.  DOA: 02/25/2024 LOS: 0  Chief complaints Chief Complaint  Patient presents with   Altered Mental Status     Brief Narrative / Interim history: 73 year old M with PMH of parkinsonism, possible Lewy body dementia, anemia, sacral and left heel decubitus presenting with altered mental status since 02/21/2024 after he started Zyprexa  by his neurologist, and admitted with acute toxic and metabolic encephalopathy.  Workup in ED significant for lactic acidosis.  CT head and MRI brain without acute finding.  Left heel x-ray raise concern for osteomyelitis.  Neurology consulted.  The next day, MRI of left heel informed osteomyelitis.  Started on broad-spectrum antibiotics.  Orthopedic surgery consulted.  Palliative consulted as well.  Subjective: Seen and examined earlier this morning.  No major events overnight or this morning.  Patient is awake but not interacting or follow commands.  Objective: Vitals:   02/26/24 0950 02/26/24 1000 02/26/24 1157 02/26/24 1202  BP: 117/68 122/78 (!) 112/37   Pulse: 89 86 85   Resp: 16 18 18    Temp: 97.8 F (36.6 C)  99.7 F (37.6 C) 98.4 F (36.9 C)  TempSrc: Axillary  Axillary Oral  SpO2: 98% 100% 96%   Weight:      Height:        Examination:  GENERAL: Very frail. HEENT: MMM.  Vision and hearing grossly intact.  NECK: Supple.  No apparent JVD.  RESP:  No IWOB.  Fair aeration bilaterally. CVS:  RRR. Heart sounds normal.  ABD/GI/GU: BS+. Abd soft, NTND.  MSK/EXT: Left Muscle mass and subcu fat loss.  Left heel ulcer. SKIN: Deep sacral decubitus and left heel ulcer.  See NEURO: Appears awake but not interacting.  No facial asymmetry.  PERRL PSYCH: Calm.  No distress or agitation.  Consultants:  Neurology Orthopedic surgery Palliative medicine  Procedures: None  Microbiology summarized: COVID-19, influenza and RSV PCR  nonreactive Blood cultures NGTD  Assessment and plan: Acute toxic and metabolic encephalopathy: Patient with possible underlying Lewy body dementia.  Temporal association of mental status change with recent initiation of Zyprexa .  Also seems to be on Xanax  at home.  CT head and MRI brain without acute finding.  UA does not suggest UTI.  EEG with moderate diffuse encephalopathy but no seizure or epileptiform discharge.  Ammonia normal.  - Appreciate input by neurology - Check B12, RPR, B12, TSH and VBG -Trial of high-dose thiamine  - Avoid sedating medication - N.p.o. and IV fluid pending SLP eval - Reorientation and delirium precaution - Discontinue tramadol  and scheduled Tylenol   Parkinsonism/possible Lewy body dementia:  - Defer to neurology.  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 Discussed goals of care with emphasis on CODE STATUS with patient's wife who is POA.  Unfortunately, she wants everything done although she is hesitant about surgical amputation.  - Consult palliative care  Nutrition/failure to thrive Body mass index is 18.47 kg/m. - Consult dietitian        DVT prophylaxis:  enoxaparin  (LOVENOX ) injection  30 mg Start: 02/26/24 0000 SCDs Start: 02/25/24 2358  Code Status: Full code-confirmed with patient's wife Family Communication: Updated patient's wife over the phone Level of care: Telemetry Medical Status is: Observation The patient will require care spanning > 2 midnights and should be moved to inpatient because: Acute  encephalopathy, left heel osteomyelitis and dehydration   Final disposition: To be determined   55 minutes with more than 50% spent in reviewing records, counseling patient/family and coordinating care.   Sch Meds:  Scheduled Meds:  acetaminophen   650 mg Oral Q6H WA   collagenase    Topical Daily   enoxaparin  (LOVENOX ) injection  30 mg Subcutaneous QHS   [START ON 03/01/2024] thiamine   200 mg Oral Daily   Continuous Infusions:  dextrose  5 % and 0.9 % NaCl     thiamine  (VITAMIN B1) injection     Followed by   NOREEN ON 02/28/2024] thiamine  (VITAMIN B1) injection     PRN Meds:.albuterol , ondansetron  **OR** ondansetron  (ZOFRAN ) IV, polyethylene glycol  Antimicrobials: Anti-infectives (From admission, onward)    None        I have personally reviewed the following labs and images: CBC: Recent Labs  Lab 02/25/24 1434 02/25/24 1654 02/26/24 0612  WBC 10.6*  --  8.8  NEUTROABS 7.8*  --   --   HGB 13.0 12.2* 11.4*  HCT 40.2 36.0* 34.3*  MCV 96.4  --  96.3  PLT 295  --  253   BMP &GFR Recent Labs  Lab 02/25/24 1434 02/25/24 1654 02/26/24 0612  NA 141 142 142  K 4.6 4.4 3.9  CL 106  --  109  CO2 23  --  23  GLUCOSE 116*  --  106*  BUN 36*  --  35*  CREATININE 1.05  --  0.88  CALCIUM  9.3  --  9.1   Estimated Creatinine Clearance: 55.6 mL/min (by C-G formula based on SCr of 0.88 mg/dL). Liver & Pancreas: Recent Labs  Lab 02/25/24 1434  AST 50*  ALT 19  ALKPHOS 79  BILITOT 1.2  PROT 6.7  ALBUMIN 3.3*   No results for input(s): LIPASE, AMYLASE in the last 168 hours. Recent Labs  Lab 02/25/24 1650  AMMONIA <13   Diabetic: No results for input(s): HGBA1C in the last 72 hours. No results for input(s): GLUCAP in the last 168 hours. Cardiac Enzymes: No results for input(s): CKTOTAL, CKMB, CKMBINDEX, TROPONINI in the last 168 hours. No results for input(s): PROBNP in the last 8760 hours. Coagulation Profile: No results for input(s):  INR, PROTIME in the last 168 hours. Thyroid Function Tests: No results for input(s): TSH, T4TOTAL, FREET4, T3FREE, THYROIDAB in the last 72 hours. Lipid Profile: No results for input(s): CHOL, HDL, LDLCALC, TRIG, CHOLHDL, LDLDIRECT in the last 72 hours. Anemia Panel: No results for input(s): VITAMINB12, FOLATE, FERRITIN, TIBC, IRON, RETICCTPCT in the last 72 hours. Urine analysis:    Component Value Date/Time   COLORURINE YELLOW 02/25/2024 1703   APPEARANCEUR CLEAR 02/25/2024 1703   LABSPEC 1.024 02/25/2024 1703   PHURINE 5.0 02/25/2024 1703   GLUCOSEU NEGATIVE 02/25/2024 1703   HGBUR NEGATIVE 02/25/2024 1703   BILIRUBINUR NEGATIVE 02/25/2024 1703   KETONESUR NEGATIVE 02/25/2024 1703   PROTEINUR NEGATIVE 02/25/2024 1703   NITRITE NEGATIVE 02/25/2024 1703   LEUKOCYTESUR NEGATIVE 02/25/2024 1703   Sepsis Labs: Invalid input(s): PROCALCITONIN, LACTICIDVEN  Microbiology: Recent Results (from the past 240 hours)  Resp panel by RT-PCR (RSV, Flu A&B, Covid) Anterior Nasal Swab  Status: None   Collection Time: 02/25/24  3:49 PM   Specimen: Anterior Nasal Swab  Result Value Ref Range Status   SARS Coronavirus 2 by RT PCR NEGATIVE NEGATIVE Final   Influenza A by PCR NEGATIVE NEGATIVE Final   Influenza B by PCR NEGATIVE NEGATIVE Final    Comment: (NOTE) The Xpert Xpress SARS-CoV-2/FLU/RSV plus assay is intended as an aid in the diagnosis of influenza from Nasopharyngeal swab specimens and should not be used as a sole basis for treatment. Nasal washings and aspirates are unacceptable for Xpert Xpress SARS-CoV-2/FLU/RSV testing.  Fact Sheet for Patients: BloggerCourse.com  Fact Sheet for Healthcare Providers: SeriousBroker.it  This test is not yet approved or cleared by the [place]  FDA and has been authorized for detection and/or diagnosis of SARS-CoV-2 by FDA under an Emergency Use  Authorization (EUA). This EUA will remain in effect (meaning this test can be used) for the duration of the COVID-19 declaration under Section 564(b)(1) of the Act, 21 U.S.C. section 360bbb-3(b)(1), unless the authorization is terminated or revoked.     Resp Syncytial Virus by PCR NEGATIVE NEGATIVE Final    Comment: (NOTE) Fact Sheet for Patients: BloggerCourse.com  Fact Sheet for Healthcare Providers: SeriousBroker.it  This test is not yet approved or cleared by the [place]  FDA and has been authorized for detection and/or diagnosis of SARS-CoV-2 by FDA under an Emergency Use Authorization (EUA). This EUA will remain in effect (meaning this test can be used) for the duration of the COVID-19 declaration under Section 564(b)(1) of the Act, 21 U.S.C. section 360bbb-3(b)(1), unless the authorization is terminated or revoked.  Performed at [facility], [address], [place], [place] [address]   Blood culture (routine x 2)     Status: None (Preliminary result)   Collection Time: 02/25/24  5:00 PM   Specimen: BLOOD RIGHT HAND  Result Value Ref Range Status   Specimen Description BLOOD RIGHT HAND  Final   Special Requests   Final    BOTTLES DRAWN AEROBIC ONLY Blood Culture results may not be optimal due to an inadequate volume of blood received in culture bottles   Culture   Final    NO GROWTH < 24 HOURS Performed at [facility], [address], [place], [place] [address]    Report Status PENDING  Incomplete  Blood culture (routine x 2)     Status: None (Preliminary result)   Collection Time: 02/25/24  5:10 PM   Specimen: BLOOD LEFT HAND  Result Value Ref Range Status   Specimen Description BLOOD LEFT HAND  Final   Special Requests   Final    BOTTLES DRAWN AEROBIC ONLY Blood Culture results may not be optimal due to an inadequate volume of blood received in culture bottles   Culture   Final    NO GROWTH < 24  HOURS Performed at [facility], [address], [place], [place] [address]    Report Status PENDING  Incomplete    Radiology Studies: EEG adult Result Date: 02/26/2024 Shelton Arlin KIDD, MD     02/26/2024 12:30 PM Patient Name: Ernest Campbell MRN: [id number] Epilepsy Attending: Arlin KIDD Shelton Referring Physician/Provider: Donnamarie Lebron PARAS, MD Date: 02/26/2024 Duration: 23.20 mins Patient history: 73yo M with ams. EEG to evaluate for seizure Level of alertness: sleep/ lethargic AEDs during EEG study: None Technical aspects: This EEG study was done with scalp electrodes positioned according to the 10-20 International system of electrode placement. Electrical activity  was reviewed with band pass filter of 1-70Hz , sensitivity of 7 uV/mm, display speed of 70mm/sec with a 60Hz  notched filter applied as appropriate. EEG data were recorded continuously and digitally stored.  Video monitoring was available and reviewed as appropriate. Description: EEG showed continuous generalized predominantly 5 to 7 Hz theta slowing admixed with intermittent 2 to 3 Hz delta slowing, at times with triphasic morphology.  Hyperventilation and photic stimulation were not performed.   ABNORMALITY - Continuous slow, generalized IMPRESSION: This study is suggestive of moderate diffuse encephalopathy. No seizures or epileptiform discharges were seen throughout the recording. Arlin MALVA Krebs   MR HEEL LEFT W WO CONTRAST Result Date: 02/26/2024 CLINICAL DATA:  Osteomyelitis of the foot EXAM: MRI OF LOWER LEFT EXTREMITY WITHOUT AND WITH CONTRAST TECHNIQUE: Multiplanar, multisequence MR imaging of the left foot from the posterior ankle through the Lisfranc joint was performed both before and after administration of intravenous contrast. CONTRAST:  5mL GADAVIST  GADOBUTROL  1 MMOL/ML IV SOLN COMPARISON:  Radiographs 06/27/2024 FINDINGS: Bones/Joint/Cartilage Abnormal edema and enhancement in the posteroinferior calcaneus as on  image 15 series 1043 compatible with osteomyelitis. Ligaments Suboptimal characterization due to imaging plane choices. Grossly intact. Muscles and Tendons Trace nonspecific edema the quadratus plantae muscle potentially from mild myositis. Mild distal Achilles tendinopathy. Soft tissues Deep ulceration along the posteroinferior heel extending to the periosteal margin of the posteroinferior calcaneus on image 14 series 1043. IMPRESSION: 1. 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 2. Trace nonspecific edema in the quadratus plantae muscle potentially from mild myositis. 3. Mild distal Achilles tendinopathy. Electronically Signed   By: Ryan Salvage M.D.   On: 02/26/2024 10:52   MR Brain W and Wo Contrast Result Date: 02/25/2024 EXAM: MRI BRAIN WITH AND WITHOUT CONTRAST 02/25/2024 11:40:00 PM TECHNIQUE: Multiplanar multisequence MRI of the head/brain was performed with and without the administration of intravenous contrast. COMPARISON: 07/18/2023 CLINICAL HISTORY: Altered mental status, nontraumatic (Ped 0-17y). FINDINGS: BRAIN AND VENTRICLES: No acute infarct. No acute intracranial hemorrhage. No mass or abnormal enhancement. No midline shift. No hydrocephalus. The sella is unremarkable. Normal flow voids. Mild volume loss. Multifocal hyperintense T2-weighted signal within the cerebral white matter, most commonly due to chronic small vessel disease. ORBITS: No acute abnormality. SINUSES AND MASTOIDS: Right maxillary sinus mucosal thickening. No acute abnormality. BONES AND SOFT TISSUES: Normal bone marrow signal. No acute soft tissue abnormality. IMPRESSION: 1. No acute intracranial abnormality. 2. Mild volume loss and multifocal hyperintense T2-weighted signal within the cerebral white matter, most commonly due to chronic small vessel disease. 3. Right maxillary sinus  mucosal thickening. Electronically signed by: Franky Stanford MD 02/25/2024 11:50 PM EDT RP Workstation: [id number]   DG Hip Unilat W or Wo Pelvis 2-3 Views Right Result Date: 02/25/2024 CLINICAL DATA:  Altered mental status. History of prior ORIF of the right femur. EXAM: DG HIP (WITH OR WITHOUT PELVIS) 2-3V RIGHT COMPARISON:  10/24/2023. FINDINGS: Diffuse osseous demineralization. No definite acute fracture or dislocation. Postoperative changes related to prior ORIF of a right femoral shaft fracture with evidence of interval hearing. Prior fixation of a right proximal femur intertrochanteric fracture. IMPRESSION: Postoperative changes related to prior ORIF of a right femoral shaft fracture with evidence of interval hearing and prior fixation of a right proximal femur intertrochanteric fracture. No definite acute osseous abnormality. Electronically Signed   By: Harrietta Sherry M.D.   On: 02/25/2024 18:15   DG Foot Complete Left  Result Date: 02/25/2024 CLINICAL DATA:  Foot ulcer. EXAM: LEFT FOOT - COMPLETE 3+ VIEW COMPARISON:  None Available. FINDINGS: Soft tissue ulceration and swelling of the heel with cortical indistinctness of the underlying calcaneal tuberosity. The remainder of the visualized bones appear intact. No evidence of dislocation. Mild first MTP joint space narrowing. IMPRESSION: Soft tissue ulceration and swelling of the heel with cortical indistinctness of the underlying calcaneal tuberosity, concerning for osteomyelitis. Electronically Signed   By: Harrietta Sherry M.D.   On: 02/25/2024 18:10   CT Head Wo Contrast Result Date: 02/25/2024 CLINICAL DATA:  Altered mental status EXAM: CT HEAD WITHOUT CONTRAST TECHNIQUE: Contiguous axial images were obtained from the base of the skull through the vertex without intravenous contrast. RADIATION DOSE REDUCTION: This exam was performed according to the departmental dose-optimization program which includes automated exposure control, adjustment of  the mA and/or kV according to patient size and/or use of iterative reconstruction technique. COMPARISON:  CT brain 11/07/2023 FINDINGS: Brain: No acute territorial infarction, hemorrhage or intracranial mass. Mild white matter hypodensity likely chronic small vessel ischemic change. Mild atrophy. Stable ventricle size. Vascular: No hyperdense vessels.  Carotid vascular calcification Skull: No fracture Sinuses/Orbits: Opacified right maxillary sinus with hyperdense secretions or possible fungal colonization as before. Other: None IMPRESSION: 1. No CT evidence for acute intracranial abnormality. 2. Mild atrophy and chronic small vessel ischemic changes of the white matter. 3. Chronic right maxillary sinus disease. Electronically Signed   By: Luke Bun M.D.   On: 02/25/2024 16:21      Izaya Netherton T. Bentlie Withem [facility]  If 7PM-7AM, please contact night-coverage [email or web address] 02/26/2024, 2:56 PM

## 2024-02-26 NOTE — Consult Note (Signed)
 Reason for Consult:Left calc osteo Referring Physician: Taye Gonfa Time called: 1441 Time at bedside: 1445   Ernest Campbell is an 73 y.o. male.  HPI: Geron was admitted yesterday for AMS. Workup showed a left heel ulcer that has been present for months. MRI showed osteo and orthopedic surgery was consulted. He remains obtunded and cannot contribute to history but wife tells me the wound would bother him sometimes but not bad or often.  Past Medical History:  Diagnosis Date   Anemia    Dementia (HCC)    Stroke [facility])     Past Surgical History:  Procedure Laterality Date   INTRAMEDULLARY (IM) NAIL INTERTROCHANTERIC Right 09/26/2023   Procedure: INTRAMEDULLARY (IM) NAIL INTERTROCHANTERIC;  Surgeon: Kendal Franky SQUIBB, MD;  Location: [facility] OR;  Service: Orthopedics;  Laterality: Right;   ORIF FEMUR FRACTURE Right 10/24/2023   Procedure: OPEN REDUCTION INTERNAL FIXATION FEMORAL SHAFT FRACTURE;  Surgeon: Kendal Franky SQUIBB, MD;  Location: [facility] OR;  Service: Orthopedics;  Laterality: Right;    Family History  Problem Relation Age of Onset   Dementia Mother    Parkinsonism Father    Stroke Father    Breast cancer Sister     Social History:  reports that he has been smoking cigarettes. He does not have any smokeless tobacco history on file. He reports that he does not drink alcohol and does not use drugs.  Allergies:  Allergies  Allergen Reactions   Fentanyl  And Related Anaphylaxis   Other Other (See Comments)    According to the patient, all opioids cause hallucinations    Prednisone  Other (See Comments)    Hallucinations - Allergic, per [facility]   Benadryl  [Diphenhydramine ] Other (See Comments)    hallucinations   Codeine Other (See Comments)    Hallucinations - Allergic, per [facility]   Morphine  Other (See Comments)    Hallucinations - Allergic, per [facility]   Oxycodone Other (See Comments)    Hallucinations - Allergic, per [facility]    Medications: I have reviewed the patient's current  medications.  Results for orders placed or performed during the hospital encounter of 02/25/24 (from the past 48 hours)  CBC with Differential     Status: Abnormal   Collection Time: 02/25/24  2:34 PM  Result Value Ref Range   WBC 10.6 (H) 4.0 - 10.5 K/uL   RBC 4.17 (L) 4.22 - 5.81 MIL/uL   Hemoglobin 13.0 13.0 - 17.0 g/dL   HCT 59.7 60.9 - 47.9 %   MCV 96.4 80.0 - 100.0 fL   MCH 31.2 26.0 - 34.0 pg   MCHC 32.3 30.0 - 36.0 g/dL   RDW 85.9 88.4 - 84.4 %   Platelets 295 150 - 400 K/uL   nRBC 0.0 0.0 - 0.2 %   Neutrophils Relative % 74 %   Neutro Abs 7.8 (H) 1.7 - 7.7 K/uL   Lymphocytes Relative 11 %   Lymphs Abs 1.2 0.7 - 4.0 K/uL   Monocytes Relative 14 %   Monocytes Absolute 1.4 (H) 0.1 - 1.0 K/uL   Eosinophils Relative 0 %   Eosinophils Absolute 0.0 0.0 - 0.5 K/uL   Basophils Relative 1 %   Basophils Absolute 0.1 0.0 - 0.1 K/uL   Immature Granulocytes 0 %   Abs Immature Granulocytes 0.03 0.00 - 0.07 K/uL    Comment: Performed at [facility], [address] [place], [place] [address]  Comprehensive metabolic panel     Status: Abnormal   Collection Time: 02/25/24  2:34 PM  Result Value Ref Range   Sodium 141 135 - 145 mmol/L   Potassium 4.6 3.5 - 5.1 mmol/L   Chloride 106 98 - 111 mmol/L   CO2 23 22 - 32 mmol/L   Glucose, Bld 116 (H) 70 - 99 mg/dL    Comment: Glucose reference range applies only to samples taken after fasting for at least 8 hours.   BUN 36 (H) 8 - 23 mg/dL   Creatinine, Ser 8.94 0.61 - 1.24 mg/dL   Calcium  9.3 8.9 - 10.3 mg/dL   Total Protein 6.7 6.5 - 8.1 g/dL   Albumin 3.3 (L) 3.5 - 5.0 g/dL   AST 50 (H) 15 - 41 U/L   ALT 19 0 - 44 U/L   Alkaline Phosphatase 79 38 - 126 U/L   Total Bilirubin 1.2 0.0 - 1.2 mg/dL   GFR, Estimated >39 >39 mL/min    Comment: (NOTE) Calculated using the CKD-EPI Creatinine Equation (2021)    Anion gap 12 5 - 15    Comment: Performed at [facility], [address], [place], [place] [address]  Resp panel  by RT-PCR (RSV, Flu A&B, Covid) Anterior Nasal Swab     Status: None   Collection Time: 02/25/24  3:49 PM   Specimen: Anterior Nasal Swab  Result Value Ref Range   SARS Coronavirus 2 by RT PCR NEGATIVE NEGATIVE   Influenza A by PCR NEGATIVE NEGATIVE   Influenza B by PCR NEGATIVE NEGATIVE    Comment: (NOTE) The Xpert Xpress SARS-CoV-2/FLU/RSV plus assay is intended as an aid in the diagnosis of influenza from Nasopharyngeal swab specimens and should not be used as a sole basis for treatment. Nasal washings and aspirates are unacceptable for Xpert Xpress SARS-CoV-2/FLU/RSV testing.  Fact Sheet for Patients: BloggerCourse.com  Fact Sheet for Healthcare Providers: SeriousBroker.it  This test is not yet approved or cleared by the [place]  FDA and has been authorized for detection and/or diagnosis of SARS-CoV-2 by FDA under an Emergency Use Authorization (EUA). This EUA will remain in effect (meaning this test can be used) for the duration of the COVID-19 declaration under Section 564(b)(1) of the Act, 21 U.S.C. section 360bbb-3(b)(1), unless the authorization is terminated or revoked.     Resp Syncytial Virus by PCR NEGATIVE NEGATIVE    Comment: (NOTE) Fact Sheet for Patients: BloggerCourse.com  Fact Sheet for Healthcare Providers: SeriousBroker.it  This test is not yet approved or cleared by the [place]  FDA and has been authorized for detection and/or diagnosis of SARS-CoV-2 by FDA under an Emergency Use Authorization (EUA). This EUA will remain in effect (meaning this test can be used) for the duration of the COVID-19 declaration under Section 564(b)(1) of the Act, 21 U.S.C. section 360bbb-3(b)(1), unless the authorization is terminated or revoked.  Performed at [facility], [address], [place], [place] [address]   Ammonia     Status: None   Collection  Time: 02/25/24  4:50 PM  Result Value Ref Range   Ammonia <13 9 - 35 umol/L    Comment: Performed at [facility], [address] [place], [place] [address]  I-Stat venous blood gas, [facility], [facility], [facility])     Status: Abnormal   Collection Time: 02/25/24  4:54 PM  Result Value Ref Range   pH, Ven 7.408 7.25 - 7.43   pCO2, Ven 38.1 (L) 44 - 60 mmHg   pO2, Ven 124 (H) 32 - 45 mmHg  Bicarbonate 24.0 20.0 - 28.0 mmol/L   TCO2 25 22 - 32 mmol/L   O2 Saturation 99 %   Acid-Base Excess 0.0 0.0 - 2.0 mmol/L   Sodium 142 135 - 145 mmol/L   Potassium 4.4 3.5 - 5.1 mmol/L   Calcium , Ion 1.12 (L) 1.15 - 1.40 mmol/L   HCT 36.0 (L) 39.0 - 52.0 %   Hemoglobin 12.2 (L) 13.0 - 17.0 g/dL   Sample type VENOUS   I-Stat CG4 Lactic Acid     Status: Abnormal   Collection Time: 02/25/24  4:54 PM  Result Value Ref Range   Lactic Acid, Venous 2.7 (HH) 0.5 - 1.9 mmol/L   Comment NOTIFIED PHYSICIAN   Blood culture (routine x 2)     Status: None (Preliminary result)   Collection Time: 02/25/24  5:00 PM   Specimen: BLOOD RIGHT HAND  Result Value Ref Range   Specimen Description BLOOD RIGHT HAND    Special Requests      BOTTLES DRAWN AEROBIC ONLY Blood Culture results may not be optimal due to an inadequate volume of blood received in culture bottles   Culture      NO GROWTH < 24 HOURS Performed at [facility], [address] [place], [place] [address]    Report Status PENDING   Urinalysis, Routine w reflex microscopic -Urine, Clean Catch     Status: None   Collection Time: 02/25/24  5:03 PM  Result Value Ref Range   Color, Urine YELLOW YELLOW   APPearance CLEAR CLEAR   Specific Gravity, Urine 1.024 1.005 - 1.030   pH 5.0 5.0 - 8.0   Glucose, UA NEGATIVE NEGATIVE mg/dL   Hgb urine dipstick NEGATIVE NEGATIVE   Bilirubin Urine NEGATIVE NEGATIVE   Ketones, ur NEGATIVE NEGATIVE mg/dL   Protein, ur NEGATIVE NEGATIVE mg/dL   Nitrite NEGATIVE NEGATIVE   Leukocytes,Ua NEGATIVE NEGATIVE     Comment: Performed at [facility], [address], [place], [place] [address]  Rapid urine drug screen (hospital performed)     Status: None   Collection Time: 02/25/24  5:03 PM  Result Value Ref Range   Opiates NONE DETECTED NONE DETECTED   Cocaine NONE DETECTED NONE DETECTED   Benzodiazepines NONE DETECTED NONE DETECTED   Amphetamines NONE DETECTED NONE DETECTED   Tetrahydrocannabinol NONE DETECTED NONE DETECTED   Barbiturates NONE DETECTED NONE DETECTED    Comment: (NOTE) DRUG SCREEN FOR MEDICAL PURPOSES ONLY.  IF CONFIRMATION IS NEEDED FOR ANY PURPOSE, NOTIFY LAB WITHIN 5 DAYS.  LOWEST DETECTABLE LIMITS FOR URINE DRUG SCREEN Drug Class                     Cutoff (ng/mL) Amphetamine and metabolites    1000 Barbiturate and metabolites    200 Benzodiazepine                 200 Opiates and metabolites        300 Cocaine and metabolites        300 THC                            50 Performed at [facility], [address], [place], [place] [address]   Blood culture (routine x 2)     Status: None (Preliminary result)   Collection Time: 02/25/24  5:10 PM   Specimen: BLOOD LEFT HAND  Result Value Ref Range   Specimen Description  BLOOD LEFT HAND    Special Requests      BOTTLES DRAWN AEROBIC ONLY Blood Culture results may not be optimal due to an inadequate volume of blood received in culture bottles   Culture      NO GROWTH < 24 HOURS Performed at [facility], [address] [place], [place] [address]    Report Status PENDING   I-Stat CG4 Lactic Acid     Status: Abnormal   Collection Time: 02/25/24  7:51 PM  Result Value Ref Range   Lactic Acid, Venous 3.4 (HH) 0.5 - 1.9 mmol/L   Comment NOTIFIED PHYSICIAN   Lactic acid, plasma     Status: None   Collection Time: 02/26/24  6:12 AM  Result Value Ref Range   Lactic Acid, Venous 1.0 0.5 - 1.9 mmol/L    Comment: Performed at [facility], [address] [place], [place] [address]  Basic metabolic  panel     Status: Abnormal   Collection Time: 02/26/24  6:12 AM  Result Value Ref Range   Sodium 142 135 - 145 mmol/L   Potassium 3.9 3.5 - 5.1 mmol/L   Chloride 109 98 - 111 mmol/L   CO2 23 22 - 32 mmol/L   Glucose, Bld 106 (H) 70 - 99 mg/dL    Comment: Glucose reference range applies only to samples taken after fasting for at least 8 hours.   BUN 35 (H) 8 - 23 mg/dL   Creatinine, Ser 9.11 0.61 - 1.24 mg/dL   Calcium  9.1 8.9 - 10.3 mg/dL   GFR, Estimated >39 >39 mL/min    Comment: (NOTE) Calculated using the CKD-EPI Creatinine Equation (2021)    Anion gap 10 5 - 15    Comment: Performed at [facility], [address], [place], [place] [address]  CBC     Status: Abnormal   Collection Time: 02/26/24  6:12 AM  Result Value Ref Range   WBC 8.8 4.0 - 10.5 K/uL   RBC 3.56 (L) 4.22 - 5.81 MIL/uL   Hemoglobin 11.4 (L) 13.0 - 17.0 g/dL   HCT 65.6 (L) 60.9 - 47.9 %   MCV 96.3 80.0 - 100.0 fL   MCH 32.0 26.0 - 34.0 pg   MCHC 33.2 30.0 - 36.0 g/dL   RDW 86.0 88.4 - 84.4 %   Platelets 253 150 - 400 K/uL   nRBC 0.0 0.0 - 0.2 %    Comment: Performed at [facility], [address] [place], [place] [address]    EEG adult Result Date: 02/26/2024 Shelton Arlin KIDD, MD     02/26/2024 12:30 PM Patient Name: DAIWIK BUFFALO MRN: [id number] Epilepsy Attending: Arlin KIDD Shelton Referring Physician/Provider: Donnamarie Lebron PARAS, MD Date: 02/26/2024 Duration: 23.20 mins Patient history: 73yo M with ams. EEG to evaluate for seizure Level of alertness: sleep/ lethargic AEDs during EEG study: None Technical aspects: This EEG study was done with scalp electrodes positioned according to the 10-20 International system of electrode placement. Electrical activity was reviewed with band pass filter of 1-70Hz , sensitivity of 7 uV/mm, display speed of 65mm/sec with a 60Hz  notched filter applied as appropriate. EEG data were recorded continuously and digitally stored.  Video monitoring was available and  reviewed as appropriate. Description: EEG showed continuous generalized predominantly 5 to 7 Hz theta slowing admixed with intermittent 2 to 3 Hz delta slowing, at times with triphasic morphology.  Hyperventilation and photic stimulation were not performed.   ABNORMALITY -  Continuous slow, generalized IMPRESSION: This study is suggestive of moderate diffuse encephalopathy. No seizures or epileptiform discharges were seen throughout the recording. Arlin MALVA Krebs   MR HEEL LEFT W WO CONTRAST Result Date: 02/26/2024 CLINICAL DATA:  Osteomyelitis of the foot EXAM: MRI OF LOWER LEFT EXTREMITY WITHOUT AND WITH CONTRAST TECHNIQUE: Multiplanar, multisequence MR imaging of the left foot from the posterior ankle through the Lisfranc joint was performed both before and after administration of intravenous contrast. CONTRAST:  5mL GADAVIST  GADOBUTROL  1 MMOL/ML IV SOLN COMPARISON:  Radiographs 06/27/2024 FINDINGS: Bones/Joint/Cartilage Abnormal edema and enhancement in the posteroinferior calcaneus as on image 15 series 1043 compatible with osteomyelitis. Ligaments Suboptimal characterization due to imaging plane choices. Grossly intact. Muscles and Tendons Trace nonspecific edema the quadratus plantae muscle potentially from mild myositis. Mild distal Achilles tendinopathy. Soft tissues Deep ulceration along the posteroinferior heel extending to the periosteal margin of the posteroinferior calcaneus on image 14 series 1043. IMPRESSION: 1. 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 2. Trace nonspecific edema in the quadratus plantae muscle potentially from mild myositis. 3. Mild distal Achilles tendinopathy. Electronically Signed   By: Ryan Salvage M.D.   On: 02/26/2024 10:52   MR Brain W and Wo Contrast Result Date: 02/25/2024 EXAM: MRI BRAIN WITH AND WITHOUT CONTRAST 02/25/2024  11:40:00 PM TECHNIQUE: Multiplanar multisequence MRI of the head/brain was performed with and without the administration of intravenous contrast. COMPARISON: 07/18/2023 CLINICAL HISTORY: Altered mental status, nontraumatic (Ped 0-17y). FINDINGS: BRAIN AND VENTRICLES: No acute infarct. No acute intracranial hemorrhage. No mass or abnormal enhancement. No midline shift. No hydrocephalus. The sella is unremarkable. Normal flow voids. Mild volume loss. Multifocal hyperintense T2-weighted signal within the cerebral white matter, most commonly due to chronic small vessel disease. ORBITS: No acute abnormality. SINUSES AND MASTOIDS: Right maxillary sinus mucosal thickening. No acute abnormality. BONES AND SOFT TISSUES: Normal bone marrow signal. No acute soft tissue abnormality. IMPRESSION: 1. No acute intracranial abnormality. 2. Mild volume loss and multifocal hyperintense T2-weighted signal within the cerebral white matter, most commonly due to chronic small vessel disease. 3. Right maxillary sinus mucosal thickening. Electronically signed by: Franky Stanford MD 02/25/2024 11:50 PM EDT RP Workstation: [id number]   DG Hip Unilat W or Wo Pelvis 2-3 Views Right Result Date: 02/25/2024 CLINICAL DATA:  Altered mental status. History of prior ORIF of the right femur. EXAM: DG HIP (WITH OR WITHOUT PELVIS) 2-3V RIGHT COMPARISON:  10/24/2023. FINDINGS: Diffuse osseous demineralization. No definite acute fracture or dislocation. Postoperative changes related to prior ORIF of a right femoral shaft fracture with evidence of interval hearing. Prior fixation of a right proximal femur intertrochanteric fracture. IMPRESSION: Postoperative changes related to prior ORIF of a right femoral shaft fracture with evidence of interval hearing and prior fixation of a right proximal femur intertrochanteric fracture. No definite acute osseous abnormality. Electronically Signed   By: Harrietta Sherry M.D.   On: 02/25/2024 18:15   DG Foot Complete  Left Result Date: 02/25/2024 CLINICAL DATA:  Foot ulcer. EXAM: LEFT FOOT - COMPLETE 3+ VIEW COMPARISON:  None Available. FINDINGS: Soft tissue ulceration and swelling of the heel with cortical indistinctness of the underlying calcaneal tuberosity. The remainder of the visualized bones appear intact. No evidence of dislocation. Mild first MTP joint space narrowing. IMPRESSION: Soft tissue ulceration and swelling of the heel with cortical indistinctness of the underlying calcaneal tuberosity, concerning for osteomyelitis. Electronically Signed   By:  Harrietta Sherry M.D.   On: 02/25/2024 18:10   CT Head Wo Contrast Result Date: 02/25/2024 CLINICAL DATA:  Altered mental status EXAM: CT HEAD WITHOUT CONTRAST TECHNIQUE: Contiguous axial images were obtained from the base of the skull through the vertex without intravenous contrast. RADIATION DOSE REDUCTION: This exam was performed according to the departmental dose-optimization program which includes automated exposure control, adjustment of the mA and/or kV according to patient size and/or use of iterative reconstruction technique. COMPARISON:  CT brain 11/07/2023 FINDINGS: Brain: No acute territorial infarction, hemorrhage or intracranial mass. Mild white matter hypodensity likely chronic small vessel ischemic change. Mild atrophy. Stable ventricle size. Vascular: No hyperdense vessels.  Carotid vascular calcification Skull: No fracture Sinuses/Orbits: Opacified right maxillary sinus with hyperdense secretions or possible fungal colonization as before. Other: None IMPRESSION: 1. No CT evidence for acute intracranial abnormality. 2. Mild atrophy and chronic small vessel ischemic changes of the white matter. 3. Chronic right maxillary sinus disease. Electronically Signed   By: Luke Bun M.D.   On: 02/25/2024 16:21   DG Chest Portable 1 View Result Date: 02/25/2024 CLINICAL DATA:  Altered mental status. EXAM: PORTABLE CHEST 1 VIEW COMPARISON:  Chest radiograph  dated 11/07/2023. FINDINGS: No focal consolidation, pleural effusion or pneumothorax. The cardiac silhouette is within normal limits. No acute osseous pathology. IMPRESSION: No active disease. Electronically Signed   By: Vanetta Chou M.D.   On: 02/25/2024 14:43    Review of Systems  Unable to perform ROS: Mental status change   Blood pressure (!) 112/37, pulse 85, temperature 98.4 F (36.9 C), temperature source Oral, resp. rate 18, height 5' 5 (1.651 m), weight 50.3 kg, SpO2 96%. Physical Exam Constitutional:      General: He is not in acute distress.    Appearance: He is well-developed. He is not diaphoretic.     Comments: Obtunded  HENT:     Head: Normocephalic and atraumatic.  Eyes:     General:        Right eye: No discharge.        Left eye: No discharge.  Cardiovascular:     Rate and Rhythm: Normal rate and regular rhythm.  Pulmonary:     Effort: Pulmonary effort is normal. No respiratory distress.  Feet:     Comments: LLE Post heel ulceration, no ecchymosis or rash  No ankle effusion  Sens DPN, SPN, TN could not assess  Motor EHL, ext, flex, evers could not assess  DP 0, PT 0, No significant edema Skin:    General: Skin is warm and dry.  Psychiatric:     Comments: Obtunded     Assessment/Plan: Left heel ulcer with calc osteo -- Will get ABI; if abnormal will need vascular consult. Recommend ID consult as well. Dr. Harden to evaluate later today or in AM. Wife is adamantly opposed to amputation at this time.    Ozell DOROTHA Ned, PA-C Orthopedic Surgery [phone] 02/26/2024, 3:17 PM

## 2024-02-26 NOTE — Consult Note (Addendum)
 WOC Nurse Consult Note: Reason for Consult: Consult requested for sacrum and left heel wounds.  Performed remotely after review of progress notes and photos in the EMR.  Sacrum with chronic Stage 4 pressure injury, red and moist with mod amt green-tinted drainage.  Left heel with chronic Unstageable pressure injury, moist eschar with patchy areas of red and yellow.  Pressure Injury POA: Yes Dressing procedure/placement/frequency: Prevalon boot to decrease pressure to left heel. Topical treatment orders provided for bedside nurses to perform to absorb drainage and provide antimicrobial benefits to the sacrum and provide enzymatic debridement to the left heel:  1. Apply Aquacel packing to sacrum Q day Soila # [phone]) and cover with foam dressing.  Change foam dressing Q 3 days or PRN soiling 2. Apply Santyl  to left heel Q day and cover with moist gauze and then ABD pad and kerlex.  Please re-consult if further assistance is needed.  Thank-you,  Stephane Fought MSN, RN, CWOCN, CWCN-AP, CNS Contact Mon-Fri 0700-1500: [phone]

## 2024-02-26 NOTE — Progress Notes (Signed)
 Noticed pt has labored breathing RR-22 O2 97%, snoring loud, had oral secretions, suctioned yellow thick secretions in moderate amount, prn nebulization given by RN. Informed RT, MD Opyd is also aware.    Updates MD Opyd, informed that pt breathes difficulty when he is sleeping and snoring but more relaxed when he is awake.

## 2024-02-26 NOTE — Procedures (Signed)
 Patient Name: Ernest Campbell  MRN: [id number]  Epilepsy Attending: Arlin MALVA Krebs  Referring Physician/Provider: Donnamarie Lebron PARAS, MD  Date: 02/26/2024 Duration: 23.20 mins  Patient history: 73yo M with ams. EEG to evaluate for seizure  Level of alertness: sleep/ lethargic   AEDs during EEG study: None  Technical aspects: This EEG study was done with scalp electrodes positioned according to the 10-20 International system of electrode placement. Electrical activity was reviewed with band pass filter of 1-70Hz , sensitivity of 7 uV/mm, display speed of 96mm/sec with a 60Hz  notched filter applied as appropriate. EEG data were recorded continuously and digitally stored.  Video monitoring was available and reviewed as appropriate.  Description: EEG showed continuous generalized predominantly 5 to 7 Hz theta slowing admixed with intermittent 2 to 3 Hz delta slowing, at times with triphasic morphology.  Hyperventilation and photic stimulation were not performed.     ABNORMALITY - Continuous slow, generalized  IMPRESSION: This study is suggestive of moderate diffuse encephalopathy. No seizures or epileptiform discharges were seen throughout the recording.  Kursten Kruk O Hines Kloss

## 2024-02-26 NOTE — ED Notes (Signed)
 Coccyx drsg washed with NS and drsg applied. Pt repositioned to R side. Cushions placed at pressure points

## 2024-02-26 NOTE — Evaluation (Addendum)
 Clinical/Bedside Swallow Evaluation Patient Details  Name: Ernest Campbell MRN: [id number] Date of Birth: 1950-09-16  Today's Date: 02/26/2024 Time: SLP Start Time (ACUTE ONLY): 1651 SLP Stop Time (ACUTE ONLY): 1706 SLP Time Calculation (min) (ACUTE ONLY): 15 min  Past Medical History:  Past Medical History:  Diagnosis Date   Anemia    Dementia (HCC)    Stroke [facility])    Past Surgical History:  Past Surgical History:  Procedure Laterality Date   INTRAMEDULLARY (IM) NAIL INTERTROCHANTERIC Right 09/26/2023   Procedure: INTRAMEDULLARY (IM) NAIL INTERTROCHANTERIC;  Surgeon: Kendal Franky SQUIBB, MD;  Location: [facility] OR;  Service: Orthopedics;  Laterality: Right;   ORIF FEMUR FRACTURE Right 10/24/2023   Procedure: OPEN REDUCTION INTERNAL FIXATION FEMORAL SHAFT FRACTURE;  Surgeon: Kendal Franky SQUIBB, MD;  Location: [facility] OR;  Service: Orthopedics;  Laterality: Right;   HPI:  Ernest Campbell is a 73 yo male presenting to ED 7/22 with AMS since 7/18 after starting Zyprexa . Admitted with acute toxic and metabolic encephalopathy, lactic acidosis, and concern for L heel osteomyelitis. MRI Brain negative. Most recently seen by SLP April 2025 with deficits related to oral holding and intermittent dry coughing with thin liquids but was ultimately discharged on Dys 2 diet with thin liquids. Previous swallow evaluation December 2024 revealed functional performance but with concern for adequate intake in light of mentation. 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 His wife denies history of dysphagia, stating he typically has a great appetite. Pt wears upper and lower dentures but only the upper set are present. He has poor awareness of bolus presentations, requiring multiple prompts to form a labial seal and initiate  oral transit with ice chips and thin liquids. He was unable to take a sip via straw and tspn sips of water resulted in immediate coughing. Awareness was somewhat improved with purees but he still required cueing to initiate a swallow. Recommend NPO status be maintained with the exception of ice chips in moderation after oral care and meds crushed in puree when pt is fully awake and alert. Discussed with pt's family and RN. SLP will f/u as level of alertness allows. SLP Visit Diagnosis: Dysphagia, unspecified (R13.10)    Aspiration Risk  Mild aspiration risk    Diet Recommendation NPO except meds;Ice chips PRN after oral care    Medication Administration: Crushed with puree    Other  Recommendations Oral Care Recommendations: Oral care QID;Oral care prior to ice chip/H20     Assistance Recommended at Discharge    Functional Status Assessment Patient has had a recent decline in their functional status and demonstrates the ability to make significant improvements in function in a reasonable and predictable amount of time.  Frequency and Duration min 2x/week  2 weeks       Prognosis Prognosis for improved oropharyngeal function: Good Barriers to Reach Goals: Cognitive deficits      Swallow Study   General HPI: Ernest Campbell is a 73 yo male presenting to ED 7/22 with AMS since 7/18 after starting Zyprexa . Admitted with acute toxic and metabolic encephalopathy, lactic acidosis, and concern for L heel osteomyelitis. MRI Brain negative. 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, anemia, sacral and L heel decubitus Type of Study: Bedside Swallow Evaluation Previous Swallow Assessment: none in chart Diet Prior to this Study: NPO  Temperature Spikes Noted: Yes Respiratory Status: Nasal cannula History of Recent Intubation: No Behavior/Cognition: Lethargic/Drowsy;Requires cueing Oral Cavity Assessment: Within Functional Limits Oral Care Completed by SLP: Recent completion by  staff Oral Cavity - Dentition: Dentures, top;Dentures, bottom Vision: Functional for self-feeding Self-Feeding Abilities: Total assist Patient Positioning: Upright in bed Baseline Vocal Quality: Not observed Volitional Cough: Cognitively unable to elicit Volitional Swallow: Unable to elicit    Oral/Motor/Sensory Function Overall Oral Motor/Sensory Function: Within functional limits   Ice Chips Ice chips: Impaired Presentation: Spoon Oral Phase Impairments: Poor awareness of bolus;Reduced lingual movement/coordination;Reduced labial seal Pharyngeal Phase Impairments: Suspected delayed Swallow   Thin Liquid Thin Liquid: Impaired Presentation: Straw;Spoon Oral Phase Impairments: Poor awareness of bolus;Reduced labial seal Pharyngeal  Phase Impairments: Cough - Immediate    Nectar Thick Nectar Thick Liquid: Not tested   Honey Thick Honey Thick Liquid: Not tested   Puree Puree: Within functional limits Presentation: Spoon   Solid     Solid: Not tested      Damien Blumenthal, M.A., CCC-SLP Speech Language Pathology, Acute Rehabilitation Services  Secure Chat preferred [phone]  02/26/2024,5:23 PM

## 2024-02-26 NOTE — ED Notes (Signed)
 312 mL of urine via bladder scan

## 2024-02-26 NOTE — Progress Notes (Signed)
 Received from ED in bed.  Skin assessment completed and was cleaned and changed.

## 2024-02-26 NOTE — ED Notes (Signed)
EEG in process

## 2024-02-26 NOTE — ED Notes (Signed)
 Pt to MRI via stretcher.

## 2024-02-26 NOTE — Progress Notes (Signed)
 EEG complete - results pending

## 2024-02-26 NOTE — Plan of Care (Signed)
 MRI brain reveals no acute abnormality. Chronic small vessel ischemic changes are noted.   EEG with continuous generalized slowing.   A/R: Ernest Campbell is a 73 y.o. 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 The only new thing and the past few days was addition of Zyprexa .  Symptoms could be related to progression of his dementing process versus abrupt mental status in the setting of new antipsychotic.  - Impression: Altered mental status, most likely secondary to administration of Zyprexa  in the context of probable Lewy-body dementia - Zyprexa  has been discontinued. Continue to monitor for possible improvement off of this medication, which may be slow as in some cases the worsening of motor and cognitive function associated with antipsychotic use in LBD patients can linger for a significant period of time after discontinuation and in some cases may result in irreversible cognitive worsening.  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  - Avoid deliriogenic medications, including benzodiazepines and anticholinergic agents.  - Avoid antipsychotics. However, if determined to be absolutely necessary for control of agitation or insomnia, low-dose Seroquel  can be used.  - Neurohospitalist service will sign off. Please call if there are additional questions.   Electronically signed: Dr. Kyndall Chaplin

## 2024-02-26 NOTE — Care Management Obs Status (Addendum)
 MEDICARE OBSERVATION STATUS NOTIFICATION   Patient Details  Name: Ernest Campbell MRN: [id number] Date of Birth: November 03, 1950   Medicare Observation Status Notification Given:  Yes Obs status explained to the wife over the phone and a copy of the letter will be sent to home address;   [name] 02/26/2024, 4:31 PM

## 2024-02-26 NOTE — ED Notes (Signed)
 Charge notified of patient transfer to floor

## 2024-02-27 ENCOUNTER — Observation Stay (HOSPITAL_COMMUNITY)

## 2024-02-27 DIAGNOSIS — A419 Sepsis, unspecified organism: Secondary | ICD-10-CM | POA: Diagnosis not present

## 2024-02-27 DIAGNOSIS — Z993 Dependence on wheelchair: Secondary | ICD-10-CM | POA: Diagnosis not present

## 2024-02-27 DIAGNOSIS — E860 Dehydration: Secondary | ICD-10-CM | POA: Diagnosis present

## 2024-02-27 DIAGNOSIS — R4182 Altered mental status, unspecified: Secondary | ICD-10-CM | POA: Diagnosis present

## 2024-02-27 DIAGNOSIS — E43 Unspecified severe protein-calorie malnutrition: Secondary | ICD-10-CM | POA: Diagnosis present

## 2024-02-27 DIAGNOSIS — G20A1 Parkinson's disease without dyskinesia, without mention of fluctuations: Secondary | ICD-10-CM | POA: Diagnosis present

## 2024-02-27 DIAGNOSIS — R410 Disorientation, unspecified: Secondary | ICD-10-CM | POA: Diagnosis not present

## 2024-02-27 DIAGNOSIS — G3183 Dementia with Lewy bodies: Secondary | ICD-10-CM | POA: Diagnosis present

## 2024-02-27 DIAGNOSIS — D649 Anemia, unspecified: Secondary | ICD-10-CM | POA: Diagnosis present

## 2024-02-27 DIAGNOSIS — F03C18 Unspecified dementia, severe, with other behavioral disturbance: Secondary | ICD-10-CM | POA: Diagnosis not present

## 2024-02-27 DIAGNOSIS — Z66 Do not resuscitate: Secondary | ICD-10-CM | POA: Diagnosis not present

## 2024-02-27 DIAGNOSIS — M86272 Subacute osteomyelitis, left ankle and foot: Secondary | ICD-10-CM

## 2024-02-27 DIAGNOSIS — Z1152 Encounter for screening for COVID-19: Secondary | ICD-10-CM | POA: Diagnosis not present

## 2024-02-27 DIAGNOSIS — G9341 Metabolic encephalopathy: Secondary | ICD-10-CM | POA: Diagnosis not present

## 2024-02-27 DIAGNOSIS — L97426 Non-pressure chronic ulcer of left heel and midfoot with bone involvement without evidence of necrosis: Secondary | ICD-10-CM | POA: Diagnosis not present

## 2024-02-27 DIAGNOSIS — E876 Hypokalemia: Secondary | ICD-10-CM | POA: Diagnosis not present

## 2024-02-27 DIAGNOSIS — F03911 Unspecified dementia, unspecified severity, with agitation: Secondary | ICD-10-CM

## 2024-02-27 DIAGNOSIS — M86172 Other acute osteomyelitis, left ankle and foot: Secondary | ICD-10-CM

## 2024-02-27 DIAGNOSIS — R509 Fever, unspecified: Secondary | ICD-10-CM | POA: Diagnosis not present

## 2024-02-27 DIAGNOSIS — Z681 Body mass index (BMI) 19 or less, adult: Secondary | ICD-10-CM | POA: Diagnosis not present

## 2024-02-27 DIAGNOSIS — R627 Adult failure to thrive: Secondary | ICD-10-CM | POA: Diagnosis present

## 2024-02-27 DIAGNOSIS — L89152 Pressure ulcer of sacral region, stage 2: Secondary | ICD-10-CM | POA: Diagnosis present

## 2024-02-27 DIAGNOSIS — M869 Osteomyelitis, unspecified: Secondary | ICD-10-CM | POA: Diagnosis not present

## 2024-02-27 DIAGNOSIS — Z515 Encounter for palliative care: Secondary | ICD-10-CM | POA: Diagnosis not present

## 2024-02-27 DIAGNOSIS — F0280 Dementia in other diseases classified elsewhere without behavioral disturbance: Secondary | ICD-10-CM | POA: Diagnosis present

## 2024-02-27 DIAGNOSIS — G9340 Encephalopathy, unspecified: Secondary | ICD-10-CM | POA: Diagnosis not present

## 2024-02-27 DIAGNOSIS — I739 Peripheral vascular disease, unspecified: Secondary | ICD-10-CM

## 2024-02-27 DIAGNOSIS — E870 Hyperosmolality and hypernatremia: Secondary | ICD-10-CM | POA: Diagnosis not present

## 2024-02-27 DIAGNOSIS — T43595A Adverse effect of other antipsychotics and neuroleptics, initial encounter: Secondary | ICD-10-CM | POA: Diagnosis present

## 2024-02-27 DIAGNOSIS — Z7189 Other specified counseling: Secondary | ICD-10-CM | POA: Diagnosis not present

## 2024-02-27 DIAGNOSIS — L8990 Pressure ulcer of unspecified site, unspecified stage: Secondary | ICD-10-CM | POA: Insufficient documentation

## 2024-02-27 DIAGNOSIS — L97509 Non-pressure chronic ulcer of other part of unspecified foot with unspecified severity: Secondary | ICD-10-CM | POA: Diagnosis not present

## 2024-02-27 DIAGNOSIS — L89620 Pressure ulcer of left heel, unstageable: Secondary | ICD-10-CM | POA: Diagnosis present

## 2024-02-27 DIAGNOSIS — E872 Acidosis, unspecified: Secondary | ICD-10-CM | POA: Diagnosis present

## 2024-02-27 DIAGNOSIS — M8610 Other acute osteomyelitis, unspecified site: Secondary | ICD-10-CM | POA: Diagnosis not present

## 2024-02-27 DIAGNOSIS — G928 Other toxic encephalopathy: Secondary | ICD-10-CM | POA: Diagnosis present

## 2024-02-27 DIAGNOSIS — F17210 Nicotine dependence, cigarettes, uncomplicated: Secondary | ICD-10-CM | POA: Diagnosis present

## 2024-02-27 DIAGNOSIS — N401 Enlarged prostate with lower urinary tract symptoms: Secondary | ICD-10-CM | POA: Diagnosis present

## 2024-02-27 DIAGNOSIS — R40244 Other coma, without documented Glasgow coma scale score, or with partial score reported, unspecified time: Secondary | ICD-10-CM | POA: Diagnosis not present

## 2024-02-27 LAB — RENAL FUNCTION PANEL
Albumin: 2.6 g/dL — ABNORMAL LOW (ref 3.5–5.0)
Anion gap: 9 (ref 5–15)
BUN: 32 mg/dL — ABNORMAL HIGH (ref 8–23)
CO2: 21 mmol/L — ABNORMAL LOW (ref 22–32)
Calcium: 8.9 mg/dL (ref 8.9–10.3)
Chloride: 114 mmol/L — ABNORMAL HIGH (ref 98–111)
Creatinine, Ser: 0.87 mg/dL (ref 0.61–1.24)
GFR, Estimated: >60 mL/min (ref >60)
Glucose, Bld: 102 mg/dL — ABNORMAL HIGH (ref 70–99)
Phosphorus: 3.2 mg/dL (ref 2.5–4.6)
Potassium: 4 mmol/L (ref 3.5–5.1)
Sodium: 144 mmol/L (ref 135–145)

## 2024-02-27 LAB — BLOOD GAS, VENOUS
Acid-Base Excess: 1 mmol/L (ref 0.0–2.0)
Bicarbonate: 25.2 mmol/L (ref 20.0–28.0)
O2 Saturation: 98.1 %
Patient temperature: 36.9
pCO2, Ven: 38 mmHg — ABNORMAL LOW (ref 44–60)
pH, Ven: 7.43 (ref 7.25–7.43)
pO2, Ven: 72 mmHg — ABNORMAL HIGH (ref 32–45)

## 2024-02-27 LAB — MAGNESIUM: Magnesium: 2 mg/dL (ref 1.7–2.4)

## 2024-02-27 LAB — RPR: RPR Ser Ql: NONREACTIVE

## 2024-02-27 LAB — CBC
HCT: 33.2 % — ABNORMAL LOW (ref 39.0–52.0)
Hemoglobin: 10.9 g/dL — ABNORMAL LOW (ref 13.0–17.0)
MCH: 31.9 pg (ref 26.0–34.0)
MCHC: 32.8 g/dL (ref 30.0–36.0)
MCV: 97.1 fL (ref 80.0–100.0)
Platelets: 250 K/uL (ref 150–400)
RBC: 3.42 MIL/uL — ABNORMAL LOW (ref 4.22–5.81)
RDW: 13.9 % (ref 11.5–15.5)
WBC: 10 K/uL (ref 4.0–10.5)
nRBC: 0 % (ref 0.0–0.2)

## 2024-02-27 LAB — C-REACTIVE PROTEIN: CRP: 14.4 mg/dL — ABNORMAL HIGH (ref <1.0)

## 2024-02-27 MED ORDER — SODIUM CHLORIDE 0.9 % IV SOLN
2.0000 g | INTRAVENOUS | Status: DC
  Administered 2024-02-27 – 2024-03-01 (×4): 2 g via INTRAVENOUS
  Filled 2024-02-27 (×4): qty 20

## 2024-02-27 MED ORDER — ENOXAPARIN SODIUM 40 MG/0.4ML IJ SOSY
40.0000 mg | PREFILLED_SYRINGE | Freq: Every day | INTRAMUSCULAR | Status: DC
  Administered 2024-02-27 – 2024-03-15 (×18): 40 mg via SUBCUTANEOUS
  Filled 2024-02-27 (×18): qty 0.4

## 2024-02-27 MED ORDER — DAPTOMYCIN-SODIUM CHLORIDE 500-0.9 MG/50ML-% IV SOLN
500.0000 mg | Freq: Every day | INTRAVENOUS | Status: DC
  Administered 2024-02-27 – 2024-03-01 (×4): 500 mg via INTRAVENOUS
  Filled 2024-02-27 (×5): qty 50

## 2024-02-27 MED ORDER — DEXTROSE-SODIUM CHLORIDE 5-0.9 % IV SOLN: INTRAVENOUS | Status: DC

## 2024-02-27 NOTE — Consult Note (Signed)
 [facility]    Date of Admission:  02/25/2024   Total days of inpatient antibiotics 1        Reason for Consult: Osteomyelitis    Principal Problem:   Altered mental status Active Problems:   AMS (altered mental status)   Toxic metabolic encephalopathy   PAD (peripheral artery disease) (HCC)   Subacute osteomyelitis of left foot (HCC)   Acute metabolic encephalopathy   Pressure injury of skin   Assessment: 73 year old male with past medical history of anemia, possible parkinsonism,, Lewy body dementia, admitted with altered mental status, recently started on Zyprexa  ID engaged as found to have #Fevers in the setting of left calcaneal osteomyelitis and necrotic ulcer MRI showed osteomyelitis of the calcaneus, status orthopedics noted foul-smelling odor from necrotic ulcer of left heel - Orthopedics engaged, patient with noted nonoperative treatment.  ABIs canceled.  ID engaged.  Recommendations:- - Follow blood cultures -Okay to continue daptomycin  ceftriaxone  at this point as patient continues to fever and blood cultures are pending -Closer to discharge switch to doxycycline and Augmentin x 6 weeks for osteomyelitis.  Extensively communicated with patient and wife that calcaneal osteomyelitis is not curative with antibiotics alone.  Recommend wound care.  Needs surgical intervention. - Communicated plan with primary -Standard precautions  Evaluation of this patient requires complex antimicrobial therapy evaluation and counseling + isolation needs for disease transmission risk assessment and mitigation   Microbiology:   Antibiotics: Daptomycin  and ceftriaxone  7/24  Cultures: Blood 7/22 Urine  Other   HPI: Ernest Campbell is a 73 y.o. male with past medical history significant for anemia, possible parkinsonism, suspected Lewy body dementia presented to ED due to altered mental status since 02/21/2024 was started on Zyprexa  at bedtime.   He is wheelchair-bound but able to pivot himself.  Performing minimal ADLs.  Diagnosed with parkinsonism on levodopa  with neurology.  Admitted for acute metabolic/toxic encephalopathy.  Patient developed a fever 101.2.  CT head and MRI brain without acute abnormalities.  Left heel x-ray with concern for osteomyelitis. MRI left heel showed deep ulceration of posteroinferior heel to periosteal margin, posterior inferior calcaneus with underlying osteomyelitis, mild myositis.  Patient started on daptomycin  and ceftriaxone .  Orthopedics engaged, who noted that patient's wife wishes to proceed with nonoperative treatment.  ABIs canceled.  ID engaged for antibiotic recommendations.  Review of Systems: ROS  Past Medical History:  Diagnosis Date   Anemia    Dementia (HCC)    Stroke (HCC)     Social History   Tobacco Use   Smoking status: Every Day    Current packs/day: 0.50    Types: Cigarettes  Substance Use Topics   Alcohol use: Never   Drug use: Never    Family History  Problem Relation Age of Onset   Dementia Mother    Parkinsonism Father    Stroke Father    Breast cancer Sister    Scheduled Meds:  collagenase    Topical Daily   enoxaparin  (LOVENOX ) injection  40 mg Subcutaneous QHS   [START ON 03/01/2024] thiamine   200 mg Oral Daily   Continuous Infusions:  cefTRIAXone  (ROCEPHIN )  IV 2 g (02/27/24 1406)   DAPTOmycin  500 mg (02/27/24 1517)   thiamine  (VITAMIN B1) injection 500 mg (02/27/24 1030)   Followed by   NOREEN ON 02/28/2024] thiamine  (VITAMIN B1) injection     PRN Meds:.acetaminophen  **OR** acetaminophen , albuterol , ondansetron  **OR** ondansetron  (ZOFRAN ) IV, polyethylene glycol Allergies  Allergen  Reactions   Fentanyl  And Related Anaphylaxis   Other Other (See Comments)    According to the patient, all opioids cause hallucinations    Prednisone  Other (See Comments)    Hallucinations - Allergic, per [facility]   Benadryl  [Diphenhydramine ] Other (See Comments)     hallucinations   Codeine Other (See Comments)    Hallucinations - Allergic, per [facility]   Morphine  Other (See Comments)    Hallucinations - Allergic, per [facility]   Oxycodone Other (See Comments)    Hallucinations - Allergic, per [facility]    OBJECTIVE: Blood pressure 136/66, pulse 98, temperature (!) 100.8 F (38.2 C), temperature source Oral, resp. rate (!) 22, height 5' 5 (1.651 m), weight 50.3 kg, SpO2 97%.  Physical Exam  Lab Results Lab Results  Component Value Date   WBC 10.0 02/27/2024   HGB 10.9 (L) 02/27/2024   HCT 33.2 (L) 02/27/2024   MCV 97.1 02/27/2024   PLT 250 02/27/2024    Lab Results  Component Value Date   CREATININE 0.87 02/27/2024   BUN 32 (H) 02/27/2024   NA 144 02/27/2024   K 4.0 02/27/2024   CL 114 (H) 02/27/2024   CO2 21 (L) 02/27/2024    Lab Results  Component Value Date   ALT 19 02/25/2024   AST 50 (H) 02/25/2024   ALKPHOS 79 02/25/2024   BILITOT 1.2 02/25/2024       Loney Stank, MD [facility] 02/27/2024, 3:44 PM

## 2024-02-27 NOTE — Progress Notes (Signed)
   02/27/24 1509  Assess: MEWS Score  Temp (!) 100.8 F (38.2 C)  BP 136/66  MAP (mmHg) 83  Pulse Rate 98  Resp (!) 22  Level of Consciousness Alert  SpO2 97 %  O2 Device Nasal Cannula  O2 Flow Rate (L/min) 4 L/min  Assess: MEWS Score  MEWS Temp 1  MEWS Systolic 0  MEWS Pulse 0  MEWS RR 1  MEWS LOC 0  MEWS Score 2  MEWS Score Color Yellow  Assess: if the MEWS score is Yellow or Red  Were vital signs accurate and taken at a resting state? Yes  Does the patient meet 2 or more of the SIRS criteria? Yes  Does the patient have a confirmed or suspected source of infection? Yes  Notify: Charge Nurse/RN  Name of Charge Nurse/RN Notified Ty  Provider Notification  Provider Name/Title Dr. Mignon Bump  Date Provider Notified 02/27/24  Time Provider Notified 1524  Method of Notification Page  Notification Reason Other (Comment) (yellow MEWS)  Provider response No new orders  Date of Provider Response 02/27/24  Time of Provider Response 1615  Assess: SIRS CRITERIA  SIRS Temperature  0  SIRS Respirations  1  SIRS Pulse 1  SIRS WBC 0  SIRS Score Sum  2   Dr. Bump notified, no new orders, no VS increase needed at this time

## 2024-02-27 NOTE — Progress Notes (Signed)
 Pharmacy Antibiotic Note  Ernest Campbell is a 73 y.o. male admitted on 02/25/2024 with osteomyelitis of the left heel with low concern for sepsis at this time. Pharmacy has been consulted for daptomycin  and ceftriaxone  dosing.  Antibiotics were held until orthopedics evaluated a need for surgical intervention. Orthopedics decided no surgery at this time and daptomycin  and ceftriaxone  were started opposed to vancomycin  and Zosyn .  Plan for ID to be involved as well due to a potential need for OPAT.  Plan: Start daptomycin  500 mg IV Q24  Start ceftriaxone  2 grams IV Q24 Monitor any future cultures and clinical progression  Height: 5' 5 (165.1 cm) Weight: 50.3 kg (111 lb) IBW/kg (Calculated) : 61.5  Temp (24hrs), Avg:99.1 F (37.3 C), Min:98.2 F (36.8 C), Max:101.2 F (38.4 C)  Recent Labs  Lab 02/25/24 1434 02/25/24 1654 02/25/24 1951 02/26/24 0612 02/27/24 0307  WBC 10.6*  --   --  8.8 10.0  CREATININE 1.05  --   --  0.88 0.87  LATICACIDVEN  --  2.7* 3.4* 1.0  --     Estimated Creatinine Clearance: 56.2 mL/min (by C-G formula based on SCr of 0.87 mg/dL).    Allergies  Allergen Reactions   Fentanyl  And Related Anaphylaxis   Other Other (See Comments)    According to the patient, all opioids cause hallucinations    Prednisone  Other (See Comments)    Hallucinations - Allergic, per [facility]   Benadryl  [Diphenhydramine ] Other (See Comments)    hallucinations   Codeine Other (See Comments)    Hallucinations - Allergic, per [facility]   Morphine  Other (See Comments)    Hallucinations - Allergic, per [facility]   Oxycodone Other (See Comments)    Hallucinations - Allergic, per [facility]    Antimicrobials this admission: None   Microbiology results: None  Thank you for allowing pharmacy to be a part of this patient's care.  Maegan Vicktoria Muckey, PharmD PGY-1 Pharmacy Resident [facility] 02/27/2024 12:40 PM

## 2024-02-27 NOTE — Consult Note (Signed)
 ORTHOPAEDIC CONSULTATION  REQUESTING PHYSICIAN: Kathrin Mignon DASEN, MD  Chief Complaint: Ulceration left heel.  HPI: Ernest Campbell is a 73 y.o. male who presents with necrotic ulcer left heel with exposed bone.  Patient is bedbound and not oriented.  Past Medical History:  Diagnosis Date   Anemia    Dementia (HCC)    Stroke [facility])    Past Surgical History:  Procedure Laterality Date   INTRAMEDULLARY (IM) NAIL INTERTROCHANTERIC Right 09/26/2023   Procedure: INTRAMEDULLARY (IM) NAIL INTERTROCHANTERIC;  Surgeon: Kendal Franky SQUIBB, MD;  Location: [facility] OR;  Service: Orthopedics;  Laterality: Right;   ORIF FEMUR FRACTURE Right 10/24/2023   Procedure: OPEN REDUCTION INTERNAL FIXATION FEMORAL SHAFT FRACTURE;  Surgeon: Kendal Franky SQUIBB, MD;  Location: [facility] OR;  Service: Orthopedics;  Laterality: Right;   Social History   Socioeconomic History   Marital status: Married    Spouse name: Not on file   Number of children: Not on file   Years of education: Not on file   Highest education level: Not on file  Occupational History   Not on file  Tobacco Use   Smoking status: Every Day    Current packs/day: 0.50    Types: Cigarettes   Smokeless tobacco: Not on file  Substance and Sexual Activity   Alcohol use: Never   Drug use: Never   Sexual activity: Not on file  Other Topics Concern   Not on file  Social History Narrative   Lives with wife    Pt retired    Chief Executive Officer Drivers of Corporate investment banker Strain: Low Risk  (10/20/2022)   Received from [facility]   Overall Financial Resource Strain (CARDIA)    Difficulty of Paying Living Expenses: Not hard at all  Food Insecurity: No Food Insecurity (11/07/2023)   Hunger Vital Sign    Worried About Running Out of Food in the Last Year: Never true    Ran Out of Food in the Last Year: Never true  Transportation Needs: No Transportation Needs (11/07/2023)   PRAPARE - Administrator, Civil Service (Medical): No    Lack of  Transportation (Non-Medical): No  Physical Activity: Unknown (10/20/2022)   Received from [facility]   Exercise Vital Sign    On average, how many days per week do you engage in moderate to strenuous exercise (like a brisk walk)?: Patient declined    On average, how many minutes do you engage in exercise at this level?: 30 min  Stress: Patient Declined (10/20/2022)   Received from [facility] Occup[facility] - Occupational Stress Questionnaire    Feeling of Stress : Patient declined  Social Connections: Unknown (11/07/2023)   Social Connection and Isolation Panel    Frequency of Communication with Friends and Family: Once a week    Frequency of Social Gatherings with Friends and Family: Once a week    Attends Religious Services: Patient unable to answer    Active Member of Clubs or Organizations: Patient unable to answer    Attends Banker Meetings: Patient unable to answer    Marital Status: Married  Recent Concern: Social Connections - Moderately Isolated (10/24/2023)   Social Connection and Isolation Panel    Frequency of Communication with Friends and Family: More than three times a week    Frequency of Social Gatherings with Friends and Family: Three times a week    Attends Religious Services: Never    Active  Member of Clubs or Organizations: No    Attends Engineer, structural: Never    Marital Status: Married   Family History  Problem Relation Age of Onset   Dementia Mother    Parkinsonism Father    Stroke Father    Breast cancer Sister    - negative except otherwise stated in the family history section Allergies  Allergen Reactions   Fentanyl  And Related Anaphylaxis   Other Other (See Comments)    According to the patient, all opioids cause hallucinations    Prednisone  Other (See Comments)    Hallucinations - Allergic, per [facility]   Benadryl  [Diphenhydramine ] Other (See Comments)    hallucinations   Codeine Other (See  Comments)    Hallucinations - Allergic, per MAR   Morphine  Other (See Comments)    Hallucinations - Allergic, per [facility]   Oxycodone Other (See Comments)    Hallucinations - Allergic, per [facility]   Prior to Admission medications   Medication Sig Start Date End Date Taking? Authorizing Provider  memantine  (NAMENDA ) 5 MG tablet Take 5 mg by mouth 2 (two) times daily.   Yes [provider]  OLANZapine  (ZYPREXA ) 2.5 MG tablet Take 1 tablet (2.5 mg total) by mouth at bedtime. 02/20/24  Yes Dohmeier, Dedra, MD  ALPRAZolam  (XANAX ) 0.5 MG tablet Take 1 tablet (0.5 mg total) by mouth at bedtime as needed. Patient not taking: Reported on 02/25/2024 02/19/24   Dohmeier, Dedra, MD   EEG adult Result Date: 02/26/2024 Shelton Arlin KIDD, MD     02/26/2024 12:30 PM Patient Name: Ernest Campbell MRN: [id number] Epilepsy Attending: Arlin KIDD Shelton Referring Physician/Provider: Donnamarie Lebron PARAS, MD Date: 02/26/2024 Duration: 23.20 mins Patient history: 73yo M with ams. EEG to evaluate for seizure Level of alertness: sleep/ lethargic AEDs during EEG study: None Technical aspects: This EEG study was done with scalp electrodes positioned according to the 10-20 International system of electrode placement. Electrical activity was reviewed with band pass filter of 1-70Hz , sensitivity of 7 uV/mm, display speed of 59mm/sec with a 60Hz  notched filter applied as appropriate. EEG data were recorded continuously and digitally stored.  Video monitoring was available and reviewed as appropriate. Description: EEG showed continuous generalized predominantly 5 to 7 Hz theta slowing admixed with intermittent 2 to 3 Hz delta slowing, at times with triphasic morphology.  Hyperventilation and photic stimulation were not performed.   ABNORMALITY - Continuous slow, generalized IMPRESSION: This study is suggestive of moderate diffuse encephalopathy. No seizures or epileptiform discharges were seen throughout the recording. Arlin KIDD Shelton   MR HEEL LEFT W WO CONTRAST Result Date: 02/26/2024 CLINICAL DATA:  Osteomyelitis of the foot EXAM: MRI OF LOWER LEFT EXTREMITY WITHOUT AND WITH CONTRAST TECHNIQUE: Multiplanar, multisequence MR imaging of the left foot from the posterior ankle through the Lisfranc joint was performed both before and after administration of intravenous contrast. CONTRAST:  5mL GADAVIST  GADOBUTROL  1 MMOL/ML IV SOLN COMPARISON:  Radiographs 06/27/2024 FINDINGS: Bones/Joint/Cartilage Abnormal edema and enhancement in the posteroinferior calcaneus as on image 15 series 1043 compatible with osteomyelitis. Ligaments Suboptimal characterization due to imaging plane choices. Grossly intact. Muscles and Tendons Trace nonspecific edema the quadratus plantae muscle potentially from mild myositis. Mild distal Achilles tendinopathy. Soft tissues Deep ulceration along the posteroinferior heel extending to the periosteal margin of the posteroinferior calcaneus on image 14 series 1043. IMPRESSION: 1. 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  2. Trace nonspecific edema in the quadratus plantae muscle potentially from mild myositis. 3. Mild distal Achilles tendinopathy. Electronically Signed   By: Ryan Salvage M.D.   On: 02/26/2024 10:52   MR Brain W and Wo Contrast Result Date: 02/25/2024 EXAM: MRI BRAIN WITH AND WITHOUT CONTRAST 02/25/2024 11:40:00 PM TECHNIQUE: Multiplanar multisequence MRI of the head/brain was performed with and without the administration of intravenous contrast. COMPARISON: 07/18/2023 CLINICAL HISTORY: Altered mental status, nontraumatic (Ped 0-17y). FINDINGS: BRAIN AND VENTRICLES: No acute infarct. No acute intracranial hemorrhage. No mass or abnormal enhancement. No midline shift. No hydrocephalus. The sella is unremarkable. Normal flow voids. Mild volume loss. Multifocal  hyperintense T2-weighted signal within the cerebral white matter, most commonly due to chronic small vessel disease. ORBITS: No acute abnormality. SINUSES AND MASTOIDS: Right maxillary sinus mucosal thickening. No acute abnormality. BONES AND SOFT TISSUES: Normal bone marrow signal. No acute soft tissue abnormality. IMPRESSION: 1. No acute intracranial abnormality. 2. Mild volume loss and multifocal hyperintense T2-weighted signal within the cerebral white matter, most commonly due to chronic small vessel disease. 3. Right maxillary sinus mucosal thickening. Electronically signed by: Franky Stanford MD 02/25/2024 11:50 PM EDT RP Workstation: [id number]   DG Hip Unilat W or Wo Pelvis 2-3 Views Right Result Date: 02/25/2024 CLINICAL DATA:  Altered mental status. History of prior ORIF of the right femur. EXAM: DG HIP (WITH OR WITHOUT PELVIS) 2-3V RIGHT COMPARISON:  10/24/2023. FINDINGS: Diffuse osseous demineralization. No definite acute fracture or dislocation. Postoperative changes related to prior ORIF of a right femoral shaft fracture with evidence of interval hearing. Prior fixation of a right proximal femur intertrochanteric fracture. IMPRESSION: Postoperative changes related to prior ORIF of a right femoral shaft fracture with evidence of interval hearing and prior fixation of a right proximal femur intertrochanteric fracture. No definite acute osseous abnormality. Electronically Signed   By: Harrietta Sherry M.D.   On: 02/25/2024 18:15   DG Foot Complete Left Result Date: 02/25/2024 CLINICAL DATA:  Foot ulcer. EXAM: LEFT FOOT - COMPLETE 3+ VIEW COMPARISON:  None Available. FINDINGS: Soft tissue ulceration and swelling of the heel with cortical indistinctness of the underlying calcaneal tuberosity. The remainder of the visualized bones appear intact. No evidence of dislocation. Mild first MTP joint space narrowing. IMPRESSION: Soft tissue ulceration and swelling of the heel with cortical indistinctness of the  underlying calcaneal tuberosity, concerning for osteomyelitis. Electronically Signed   By: Harrietta Sherry M.D.   On: 02/25/2024 18:10   CT Head Wo Contrast Result Date: 02/25/2024 CLINICAL DATA:  Altered mental status EXAM: CT HEAD WITHOUT CONTRAST TECHNIQUE: Contiguous axial images were obtained from the base of the skull through the vertex without intravenous contrast. RADIATION DOSE REDUCTION: This exam was performed according to the departmental dose-optimization program which includes automated exposure control, adjustment of the mA and/or kV according to patient size and/or use of iterative reconstruction technique. COMPARISON:  CT brain 11/07/2023 FINDINGS: Brain: No acute territorial infarction, hemorrhage or intracranial mass. Mild white matter hypodensity likely chronic small vessel ischemic change. Mild atrophy. Stable ventricle size. Vascular: No hyperdense vessels.  Carotid vascular calcification Skull: No fracture Sinuses/Orbits: Opacified right maxillary sinus with hyperdense secretions or possible fungal colonization as before. Other: None IMPRESSION: 1. No CT evidence for acute intracranial abnormality. 2. Mild atrophy and chronic small vessel ischemic changes of the white matter. 3. Chronic right maxillary sinus disease. Electronically Signed   By: Luke Bun M.D.   On: 02/25/2024 16:21   DG Chest Portable 1  View Result Date: 02/25/2024 CLINICAL DATA:  Altered mental status. EXAM: PORTABLE CHEST 1 VIEW COMPARISON:  Chest radiograph dated 11/07/2023. FINDINGS: No focal consolidation, pleural effusion or pneumothorax. The cardiac silhouette is within normal limits. No acute osseous pathology. IMPRESSION: No active disease. Electronically Signed   By: Vanetta Chou M.D.   On: 02/25/2024 14:43   - pertinent xrays, CT, MRI studies were reviewed and independently interpreted  Positive ROS: All other systems have been reviewed and were otherwise negative with the exception of those  mentioned in the HPI and as above.  Physical Exam: General: Not alert, no acute distress Psychiatric: Patient is not competent for consent with flat mood and affect.   Lymphatic: No axillary or cervical lymphadenopathy Cardiovascular: pedal edema Respiratory: No cyanosis, no use of accessory musculature GI: No organomegaly, abdomen is soft and non-tender    Images:  @ENCIMAGES @  Labs:  Lab Results  Component Value Date   HGBA1C 5.8 (H) 07/19/2023   ESRSEDRATE 39 (H) 02/26/2024   ESRSEDRATE 8 02/19/2024   ESRSEDRATE 1 07/19/2023   CRP 14.4 (H) 02/27/2024   CRP 0.5 07/19/2023   REPTSTATUS PENDING 02/25/2024   GRAMSTAIN  07/20/2023    WBC PRESENT, PREDOMINANTLY PMN NO ORGANISMS SEEN CYTOSPIN SMEAR    CULT  02/25/2024    NO GROWTH < 24 HOURS Performed at [facility], [address], [place], [place] [address]    LABORGA ESCHERICHIA COLI (A) 11/07/2023    Lab Results  Component Value Date   ALBUMIN 2.6 (L) 02/27/2024   ALBUMIN 3.3 (L) 02/25/2024   ALBUMIN 4.2 02/19/2024        Latest Ref Rng & Units 02/27/2024    3:07 AM 02/26/2024    6:12 AM 02/25/2024    4:54 PM  CBC EXTENDED  WBC 4.0 - 10.5 K/uL 10.0  8.8    RBC 4.22 - 5.81 MIL/uL 3.42  3.56    Hemoglobin 13.0 - 17.0 g/dL 89.0  88.5  87.7   HCT 39.0 - 52.0 % 33.2  34.3  36.0   Platelets 150 - 400 K/uL 250  253      Neurologic: Patient does not have protective sensation bilateral lower extremities.   MUSCULOSKELETAL:   Skin: Pink examination patient has a necrotic ulcer a 1 cm diameter over the left heel there is foul-smelling odor.  The ulcer probes to bone.  Patient does not have a palpable dorsalis pedis pulse.  Review of the MRI scan shows osteomyelitis of the calcaneus.  Patient does not have palpable pulses.  Ankle-brachial indices are pending.  There is no ascending cellulitis.  White cell count 10.0 with a hemoglobin of 10.9.  Albumin 2.6.  Hemoglobin A1c 6.8.  Sed rate 8 with a  C-reactive protein of 14.4.  Assessment: Assessment: Osteomyelitis with necrotic ulcer left heel with no ascending cellulitis no signs or symptoms of sepsis.  Plan: Patient's wife's wishes are to proceed with nonoperative treatment.  Would continue with the Santyl  dressing change continue with the Prevalon boot.  Continue with comfort care.  I will cancel the ABI.  No intervention indicated with ABI results.  Thank you for the consult and the opportunity to see Mr. Joushua Dugar, MD [facility] [phone] 7:59 AM

## 2024-02-27 NOTE — Progress Notes (Signed)
 PROGRESS NOTE  Ernest Campbell FMW:[id number] DOB: May 03, 1951   PCP: Pcp, No  Patient is from: Home.  DOA: 02/25/2024 LOS: 0  Chief complaints Chief Complaint  Patient presents with   Altered Mental Status     Brief Narrative / Interim history: 73 year old M with PMH of parkinsonism, possible Lewy body dementia, anemia, sacral and left heel decubitus presenting with altered mental status since 02/21/2024 after he started Zyprexa  by his neurologist, and admitted with acute toxic and metabolic encephalopathy.  Workup in ED significant for lactic acidosis.  CT head and MRI brain without acute finding.  Left heel x-ray raise concern for osteomyelitis.  Neurology consulted.  The next day, MRI of left heel informed osteomyelitis.  Orthopedic surgery consulted but family declined surgery and chose nonoperative treatment.  Started on broad-spectrum antibiotics.  ID and palliative medicine consulted.  Subjective: Seen and examined earlier this morning.  No major events overnight or this morning.  Patient seems awake but not conversing or engaging.  Objective: Vitals:   02/26/24 2344 02/27/24 0358 02/27/24 0742 02/27/24 1107  BP: 124/63 98/68 (!) 111/57 114/63  Pulse: 88 77 79 72  Resp: 18  16 (!) 21  Temp: 98.4 F (36.9 C) 98.6 F (37 C) 98.7 F (37.1 C) 99.5 F (37.5 C)  TempSrc: Axillary Axillary Oral Oral  SpO2: 100% 100% 100% 99%  Weight:      Height:        Examination:  GENERAL: Very frail. HEENT: MMM.  Vision and hearing seems to be intact. NECK: Supple.  No apparent JVD.  RESP:  No IWOB.  Fair aeration bilaterally. CVS:  RRR. Heart sounds normal.  ABD/GI/GU: BS+. Abd soft, NTND.  MSK/EXT: Left Muscle mass and subcu fat loss.  Left heel ulcer. SKIN: Deep sacral decubitus and left heel ulcer.  See NEURO: Appears awake but not interacting.  No facial asymmetry.  PERRL PSYCH: Calm.  No distress or agitation.  Consultants:  Neurology Orthopedic surgery Infectious  disease Palliative medicine  Procedures: None  Microbiology summarized: COVID-19, influenza and RSV PCR nonreactive Blood cultures NGTD  Assessment and plan: Acute toxic and metabolic encephalopathy: Patient with possible underlying Lewy body dementia.  Temporal association of mental status change with recent initiation of Zyprexa .  Also seems to be on Xanax  at home.  CT head and MRI brain without acute finding.  EEG with moderate diffuse encephalopathy but no seizure or epileptiform discharge. UA does not suggest UTI.   Ammonia, B12, RPR, VBG and TSH unrevealing. -Appreciate input by neurology-signed off -Continue trial of high-dose thiamine  -Avoid sedating medication -N.p.o. and IV fluid pending SLP eval -Reorientation and delirium precaution - Palliative medicine on board  Parkinsonism/possible Lewy body dementia:  - Appreciate input by neurology.  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  Patient's wife refused surgical intervention.  CRP elevated to 14.4.  ESR 39. 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  Goal of care discussion: Extensive goals of care discussion with patient's wife on 7/23 and 7/24.  Extensively discussed pros and cons of CPR and intubation in light of patient's condition.  I frankly recommended DNR/DNI.  Patient's wife voiced understanding but she is not ready to make CODE STATUS change yet.  Palliative medicine on board.  Nutrition/failure to thrive Body mass index is 18.47 kg/m. - Dietitian  consulted.        DVT prophylaxis:  enoxaparin  (LOVENOX ) injection 40 mg Start: 02/27/24 2200 SCDs Start: 02/25/24 2358  Code Status: Full code-confirmed with patient's wife Family Communication: Updated patient's wife at bedside. Level of care: Telemetry  Medical Status is: Observation The patient will require care spanning > 2 midnights and should be moved to inpatient because: Acute encephalopathy, left heel osteomyelitis and failure to thrive   Final disposition: To be determined   55 minutes with more than 50% spent in reviewing records, counseling patient/family and coordinating care.   Sch Meds:  Scheduled Meds:  collagenase    Topical Daily   enoxaparin  (LOVENOX ) injection  40 mg Subcutaneous QHS   [START ON 03/01/2024] thiamine   200 mg Oral Daily   Continuous Infusions:  cefTRIAXone  (ROCEPHIN )  IV     DAPTOmycin      dextrose  5 % and 0.9 % NaCl 75 mL/hr at 02/27/24 9378   thiamine  (VITAMIN B1) injection 500 mg (02/27/24 1030)   Followed by   NOREEN ON 02/28/2024] thiamine  (VITAMIN B1) injection     PRN Meds:.acetaminophen  **OR** acetaminophen , albuterol , ondansetron  **OR** ondansetron  (ZOFRAN ) IV, polyethylene glycol  Antimicrobials: Anti-infectives (From admission, onward)    Start     Dose/Rate Route Frequency Ordered Stop   02/27/24 1400  cefTRIAXone  (ROCEPHIN ) 2 g in sodium chloride  0.9 % 100 mL IVPB        2 g 200 mL/hr over 30 Minutes Intravenous Every 24 hours 02/27/24 1247     02/27/24 1400  DAPTOmycin  (CUBICIN ) IVPB 500 mg/48mL premix        500 mg 100 mL/hr over 30 Minutes Intravenous Daily 02/27/24 1247          I have personally reviewed the following labs and images: CBC: Recent Labs  Lab 02/25/24 1434 02/25/24 1654 02/26/24 0612 02/27/24 0307  WBC 10.6*  --  8.8 10.0  NEUTROABS 7.8*  --   --   --   HGB 13.0 12.2* 11.4* 10.9*  HCT 40.2 36.0* 34.3* 33.2*  MCV 96.4  --  96.3 97.1  PLT 295  --  253 250   BMP &GFR Recent Labs  Lab 02/25/24 1434 02/25/24 1654 02/26/24 0612 02/27/24 0307  NA 141 142 142 144  K 4.6 4.4 3.9 4.0  CL 106  --  109 114*  CO2 23  --  23 21*  GLUCOSE 116*  --  106* 102*  BUN 36*  --  35* 32*  CREATININE 1.05  --  0.88 0.87  CALCIUM  9.3  --  9.1 8.9  MG  --    --   --  2.0  PHOS  --   --   --  3.2   Estimated Creatinine Clearance: 56.2 mL/min (by C-G formula based on SCr of 0.87 mg/dL). Liver & Pancreas: Recent Labs  Lab 02/25/24 1434 02/27/24 0307  AST 50*  --   ALT 19  --   ALKPHOS 79  --   BILITOT 1.2  --   PROT 6.7  --   ALBUMIN 3.3* 2.6*   No results for input(s): LIPASE, AMYLASE in the last 168 hours. Recent Labs  Lab 02/25/24 1650  AMMONIA <13   Diabetic: No results for input(s): HGBA1C in the last 72 hours. No results for input(s): GLUCAP in the last 168 hours. Cardiac Enzymes: No results for input(s): CKTOTAL, CKMB, CKMBINDEX, TROPONINI in the last 168 hours. No results for input(s): PROBNP in the last 8760 hours. Coagulation Profile: No  results for input(s): INR, PROTIME in the last 168 hours. Thyroid Function Tests: Recent Labs    02/26/24 1607  TSH 1.431   Lipid Profile: No results for input(s): CHOL, HDL, LDLCALC, TRIG, CHOLHDL, LDLDIRECT in the last 72 hours. Anemia Panel: Recent Labs    02/26/24 1607  VITAMINB12 481   Urine analysis:    Component Value Date/Time   COLORURINE YELLOW 02/25/2024 1703   APPEARANCEUR CLEAR 02/25/2024 1703   LABSPEC 1.024 02/25/2024 1703   PHURINE 5.0 02/25/2024 1703   GLUCOSEU NEGATIVE 02/25/2024 1703   HGBUR NEGATIVE 02/25/2024 1703   BILIRUBINUR NEGATIVE 02/25/2024 1703   KETONESUR NEGATIVE 02/25/2024 1703   PROTEINUR NEGATIVE 02/25/2024 1703   NITRITE NEGATIVE 02/25/2024 1703   LEUKOCYTESUR NEGATIVE 02/25/2024 1703   Sepsis Labs: Invalid input(s): PROCALCITONIN, LACTICIDVEN  Microbiology: Recent Results (from the past 240 hours)  Resp panel by RT-PCR (RSV, Flu A&B, Covid) Anterior Nasal Swab     Status: None   Collection Time: 02/25/24  3:49 PM   Specimen: Anterior Nasal Swab  Result Value Ref Range Status   SARS Coronavirus 2 by RT PCR NEGATIVE NEGATIVE Final   Influenza A by PCR NEGATIVE NEGATIVE Final   Influenza B  by PCR NEGATIVE NEGATIVE Final    Comment: (NOTE) The Xpert Xpress SARS-CoV-2/FLU/RSV plus assay is intended as an aid in the diagnosis of influenza from Nasopharyngeal swab specimens and should not be used as a sole basis for treatment. Nasal washings and aspirates are unacceptable for Xpert Xpress SARS-CoV-2/FLU/RSV testing.  Fact Sheet for Patients: BloggerCourse.com  Fact Sheet for Healthcare Providers: SeriousBroker.it  This test is not yet approved or cleared by the [place]  FDA and has been authorized for detection and/or diagnosis of SARS-CoV-2 by FDA under an Emergency Use Authorization (EUA). This EUA will remain in effect (meaning this test can be used) for the duration of the COVID-19 declaration under Section 564(b)(1) of the Act, 21 U.S.C. section 360bbb-3(b)(1), unless the authorization is terminated or revoked.     Resp Syncytial Virus by PCR NEGATIVE NEGATIVE Final    Comment: (NOTE) Fact Sheet for Patients: BloggerCourse.com  Fact Sheet for Healthcare Providers: SeriousBroker.it  This test is not yet approved or cleared by the [place]  FDA and has been authorized for detection and/or diagnosis of SARS-CoV-2 by FDA under an Emergency Use Authorization (EUA). This EUA will remain in effect (meaning this test can be used) for the duration of the COVID-19 declaration under Section 564(b)(1) of the Act, 21 U.S.C. section 360bbb-3(b)(1), unless the authorization is terminated or revoked.  Performed at [facility], [address], [place], [place] [address]   Blood culture (routine x 2)     Status: None (Preliminary result)   Collection Time: 02/25/24  5:00 PM   Specimen: BLOOD RIGHT HAND  Result Value Ref Range Status   Specimen Description BLOOD RIGHT HAND  Final   Special Requests   Final    BOTTLES DRAWN AEROBIC ONLY Blood Culture results  may not be optimal due to an inadequate volume of blood received in culture bottles   Culture   Final    NO GROWTH < 24 HOURS Performed at [facility], [address] [place], [place] [address]    Report Status PENDING  Incomplete  Blood culture (routine x 2)     Status: None (Preliminary result)   Collection Time: 02/25/24  5:10 PM   Specimen: BLOOD LEFT HAND  Result Value Ref Range Status  Specimen Description BLOOD LEFT HAND  Final   Special Requests   Final    BOTTLES DRAWN AEROBIC ONLY Blood Culture results may not be optimal due to an inadequate volume of blood received in culture bottles   Culture   Final    NO GROWTH < 24 HOURS Performed at [facility], [address], [place], [place] [address]    Report Status PENDING  Incomplete    Radiology Studies: No results found.     Jorell Agne T. Devere Brem [facility]  If 7PM-7AM, please contact night-coverage [email or web address] 02/27/2024, 1:37 PM

## 2024-02-27 NOTE — Consult Note (Signed)
 Consultation Note Date: 02/27/2024   Patient Name: Ernest Campbell  DOB: 04/08/1951  MRN: [id number]  Age / Sex: 73 y.o., male  PCP: Pcp, No Referring Physician: Kathrin Mignon DASEN, MD  Reason for Consultation: Establishing goals of care  HPI/Patient Profile: 73 y.o. male  with past medical history of parkinsonism, possible Lewy body dementia, anemia, sacral and left heel decubitus admitted on 02/25/2024 with altered mental status since 02/21/2024 after he started Zyprexa  by his neurologist. Workup in ED significant for lactic acidosis. CT head and MRI brain without acute finding. Left heel x-ray raise concern for osteomyelitis. MRI of left heel informed osteomyelitis.  Orthopedic surgery consulted but family declined surgery and chose nonoperative treatment.  Started on broad-spectrum antibiotics.  ID and palliative medicine consulted.   Clinical Assessment and Goals of Care: I have reviewed medical records including EPIC notes, labs and imaging, received report from Dr. Kathrin, assessed the patient and then met with patient's wife Grayce to discuss diagnosis prognosis, GOC, EOL wishes, disposition and options.  I introduced Palliative Medicine as specialized medical care for people living with serious illness. It focuses on providing relief from the symptoms and stress of a serious illness. The goal is to improve quality of life for both the patient and the family.  We discussed a brief life review of the patient.  She tells me they have been married 40 years.  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  Grayce shares a lot about the patient's recent history.  He had been in a rehab facility for a while and was discharged home in May of this year.  Prior to hospitalization patient had improved since May.  He was able to pivot himself and feed himself.  He needed assistance with bathroom and bathing needs.  She tells me he maintained  an excellent appetite and had recently gained weight.  She tells me despite his dementia he remained very verbal and was able to communicate his needs.  She tells me his decline over the past week since he was started on Zyprexa .  She tells me this was started due to sundowning.  She tells me they have lots of family support.  She also shares she has an excellent relationship with patient's outpatient neurologist and trusts them.  She also feels well supported by their home health agency.   We discussed patient's current illness and what it means in the larger context of patient's on-going co-morbidities.  Natural disease trajectory and expectations at EOL were discussed.  We reviewed that current situation is a significant hit to patient's status.  We reviewed concerned that his new baseline will be worse than his prior baseline.  She understands this.  Grayce acknowledges that the patient is quite ill in the future is uncertain.  She tells me she knows his body is tired.  I attempted to elicit values and goals of care important to the patient.    The difference between aggressive medical intervention and comfort care was considered in light of the patient's goals of care.   Grayce shares she is open to medical interventions to prolong life.  We review her conversation with orthopedics -she declined amputation.  We discussed reasoning for this recommendation.  She understands but is committed to no amputation.  She does agree to any other intervention offered -she tells me stenting was mentioned to her.  She is also agreeable to long-term antibiotics.  She shares the patient will  never go to a rehab facility again.  She is only interested in her return home with support of home health for both rehabilitation and IV antibiotics if recommended by medical team.  We reviewed CODE STATUS.  She wishes for patient to remain full code.  She initially states she would not want him on a ventilator but as  we discussed that if patient required CPR he would most likely require ventilator she shares she would except short-term ventilator.  She would never want patient's wife maintained on ventilator long-term.  Discussed with wife the importance of continued conversation with family and the medical providers regarding overall plan of care and treatment options, ensuring decisions are within the context of the patient's values and GOCs.    Questions and concerns were addressed. The family was encouraged to call with questions or concerns.  Primary Decision Maker NEXT OF KIN -wife Grayce    SUMMARY OF RECOMMENDATIONS   -Full code/full scope (would not want ventilator long-term) - Open to medical interventions offered to prolong life though she does not want amputation; wants to continue antibiotics - Does not want rehab -wants to return home with support of home health for rehabilitation and IV antibiotics if recommended by medical team  - received update from RD that further discussion is needed around artificial nutrition, will plan to f/u 7/25 - planning to proceed with cortrak  Code Status/Advance Care Planning: Full code      Primary Diagnoses: Present on Admission:  Altered mental status  AMS (altered mental status)   I have reviewed the medical record, interviewed the patient and family, and examined the patient. The following aspects are pertinent.  Past Medical History:  Diagnosis Date   Anemia    Dementia (HCC)    Stroke [facility])    Social History   Socioeconomic History   Marital status: Married    Spouse name: Not on file   Number of children: Not on file   Years of education: Not on file   Highest education level: Not on file  Occupational History   Not on file  Tobacco Use   Smoking status: Every Day    Current packs/day: 0.50    Types: Cigarettes   Smokeless tobacco: Not on file  Substance and Sexual Activity   Alcohol use: Never   Drug use: Never   Sexual  activity: Not on file  Other Topics Concern   Not on file  Social History Narrative   Lives with wife    Pt retired    Chief Executive Officer Drivers of Corporate investment banker Strain: Low Risk  (10/20/2022)   Received from [facility]   Overall Financial Resource Strain (CARDIA)    Difficulty of Paying Living Expenses: Not hard at all  Food Insecurity: No Food Insecurity (11/07/2023)   Hunger Vital Sign    Worried About Running Out of Food in the Last Year: Never true    Ran Out of Food in the Last Year: Never true  Transportation Needs: No Transportation Needs (11/07/2023)   PRAPARE - Administrator, Civil Service (Medical): No    Lack of Transportation (Non-Medical): No  Physical Activity: Unknown (10/20/2022)   Received from [facility]   Exercise Vital Sign    On average, how many days per week do you engage in moderate to strenuous exercise (like a brisk walk)?: Patient declined    On average, how many minutes do you engage in exercise at this level?: 30 min  Stress: Patient Declined (10/20/2022)   Received from [facility] - Occupational Stress Questionnaire    Feeling of Stress : Patient declined  Social Connections: Unknown (11/07/2023)   Social Connection and Isolation Panel    Frequency of Communication with Friends and Family: Once a week    Frequency of Social Gatherings with Friends and Family: Once a week    Attends Religious Services: Patient unable to answer    Active Member of Clubs or Organizations: Patient unable to answer    Attends Banker Meetings: Patient unable to answer    Marital Status: Married  Recent Concern: Social Connections - Moderately Isolated (10/24/2023)   Social Connection and Isolation Panel    Frequency of Communication with Friends and Family: More than three times a week    Frequency of Social Gatherings with Friends and Family: Three times a week    Attends Religious Services:  Never    Active Member of Clubs or Organizations: No    Attends Engineer, structural: Never    Marital Status: Married   Family History  Problem Relation Age of Onset   Dementia Mother    Parkinsonism Father    Stroke Father    Breast cancer Sister    Scheduled Meds:  collagenase    Topical Daily   enoxaparin  (LOVENOX ) injection  30 mg Subcutaneous QHS   [START ON 03/01/2024] thiamine   200 mg Oral Daily   Continuous Infusions:  dextrose  5 % and 0.9 % NaCl 75 mL/hr at 02/27/24 9378   thiamine  (VITAMIN B1) injection 500 mg (02/27/24 1030)   Followed by   NOREEN ON 02/28/2024] thiamine  (VITAMIN B1) injection     PRN Meds:.acetaminophen  **OR** acetaminophen , albuterol , ondansetron  **OR** ondansetron  (ZOFRAN ) IV, polyethylene glycol Allergies  Allergen Reactions   Fentanyl  And Related Anaphylaxis   Other Other (See Comments)    According to the patient, all opioids cause hallucinations    Prednisone  Other (See Comments)    Hallucinations - Allergic, per [facility]   Benadryl  [Diphenhydramine ] Other (See Comments)    hallucinations   Codeine Other (See Comments)    Hallucinations - Allergic, per MAR   Morphine  Other (See Comments)    Hallucinations - Allergic, per MAR   Oxycodone Other (See Comments)    Hallucinations - Allergic, per MAR   Review of Systems  Unable to perform ROS: Mental status change    Physical Exam Constitutional:      General: He is not in acute distress.    Appearance: He is ill-appearing.     Comments: Eyes open but no meaningful interaction  Pulmonary:     Effort: Pulmonary effort is normal.  Skin:    General: Skin is warm and dry.     Vital Signs: BP 114/63 (BP Location: Right Arm)   Pulse 72   Temp 99.5 F (37.5 C) (Oral)   Resp (!) 21   Ht 5' 5 (1.651 m)   Wt 50.3 kg   SpO2 99%   BMI 18.47 kg/m  Pain Scale: PAINAD       SpO2: SpO2: 99 % O2 Device:SpO2: 99 % O2 Flow Rate: .O2 Flow Rate (L/min): 4 L/min  IO:  Intake/output summary:  Intake/Output Summary (Last 24 hours) at 02/27/2024 1208 Last data filed at 02/27/2024 0424 Gross per 24 hour  Intake --  Output 350 ml  Net -350 ml    LBM: Last BM Date : 02/27/24 Baseline Weight: Weight: 50.3 kg  Most recent weight: Weight: 50.3 kg     Palliative Assessment/Data: PPS 10% - no PO intake     *Please note that this is a verbal dictation therefore any spelling or grammatical errors are due to the Dragon Medical One system interpretation.   Time Total: 90 minutes Time spent includes: Detailed review of medical records (labs, imaging, vital signs), medically appropriate exam, discussion with treatment team, counseling and educating patient, family and/or staff, documenting clinical information, medication management and coordination of care.    Tobey Jama Barnacle, DNP, AGNP-C Palliative Medicine Team [phone] Pager: [phone]

## 2024-02-27 NOTE — Progress Notes (Addendum)
 Speech Language Pathology Treatment: Dysphagia  Patient Details Name: Ernest Campbell MRN: [id number] DOB: 23-Oct-1950 Today's Date: 02/27/2024 Time: 8869-8861 SLP Time Calculation (min) (ACUTE ONLY): 8 min  Assessment / Plan / Recommendation Clinical Impression  Ernest Campbell's dentures were doffed today and he was very lethargic. He could close his lips around a straw/spoon given Max multimodal cueing but passively accepts the bolus into his oral cavity. Swallow initiation appeared prompt but was frequently followed by forceful coughing. Pt's lethargy/AMS and posterior leaning head posture puts him at increased risk of aspiration. When he is alert, recommend continuing ice chips and meds crushed in puree with frequent oral care. Discussed with RN and RD. Will f/u.    HPI HPI: Ernest Campbell is a 73 yo male presenting to ED 7/22 with AMS since 7/18 after starting Zyprexa . Admitted with acute toxic and metabolic encephalopathy, lactic acidosis, and concern for L heel osteomyelitis. MRI Brain negative. Most recently seen by SLP April 2025 with deficits related to oral holding and intermittent dry coughing with thin liquids but was ultimately discharged on Dys 2 diet with thin liquids. Previous swallow evaluation December 2024 revealed functional performance but with concern for adequate intake in light of mentation. 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, anemia, sacral and L heel decubitus      SLP Plan  Continue with current plan of care          Recommendations  Diet recommendations: NPO Medication Administration: Crushed with puree                  Oral care QID;Oral care prior to ice chip/H20   Frequent or constant Supervision/Assistance Dysphagia, unspecified (R13.10)     Continue with current plan of care     Damien Blumenthal, M.A., CCC-SLP Speech Language Pathology, Acute Rehabilitation Services  Secure Chat preferred [phone]   02/27/2024, 12:44 PM

## 2024-02-27 NOTE — Plan of Care (Signed)

## 2024-02-27 NOTE — Progress Notes (Addendum)
 Initial Nutrition Assessment  DOCUMENTATION CODES:  Severe malnutrition in context of social or environmental circumstances, Underweight  INTERVENTION:  Once placed, initiate tube feeding via cortrak: Osmolite 1.5 at 50 ml/h (1200 ml per day) Start at 20 and advance by 10mL every 12 hours to reach goal Prosource TF20 60 ml 1x/d Free water: 100mL every 4 hours Provides 1880 kcal, 95 gm protein, 1514 ml free water daily Pt is at risk for refeeding syndrome given severe malnutrition and several days of no nutrition. Monitor magnesium and phosphorus daily x 4 days, MD to replete as needed. 100mg  thiamine  x 5 days 1 packet Juven BID, each packet provides 95 calories, 2.5 grams of protein (collagen) + micronutrients to support wound healing   NUTRITION DIAGNOSIS:  Severe Malnutrition related to social / environmental circumstances (frequent hospitalizations and SNF stay) as evidenced by severe muscle depletion, severe fat depletion, percent weight loss (14% x 6 months).  GOAL:  Patient will meet greater than or equal to 90% of their needs  MONITOR:  TF tolerance, Diet advancement, Labs, Weight trends, Skin  REASON FOR ASSESSMENT:  Consult Assessment of nutrition requirement/status  ASSESSMENT:  Pt with hx of prior CVA and dementia presented to ED with AMS after taking a new medication. Per wife, pt too sleepy to eat or drink for several days  Pt resting in bed at the time of assessment. Still very sleepy and lethargic and unable to provide a nutrition hx. Wife at bedside is pt's caregiver and able to give a detailed hx of illness and usual intake.   Pt has had several unfortunate events within the 6 months or so that have led to multiple hospitalizations and a rehab stay. 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 Unfortunately, suffered a CVA in December 2024 but was able to discharge home (weighed 137 lb 07/10/23 at outpatient  appointment) but then later went to a SNF for rehab to help improve strength and function. While at rehab, pt had a series of falls 2 of which resulted in hip/femur fractures. Pt returned to the hospital with a UTI in April (noted to be about 111 lb at that admission).   Wife reports that since being home from rehab (about 2 months) pt has been doing much better and has started gaining weight under her care. Reports that when he returned home from [place] he weighed 95 lb and that he had gained back up to 125 lb. States that pt's appetite is very good and that he eats a large amount at home. Pt typically wakes about 9AM and eats a meal about every 3 hours while he is awake. Wife reports usually and 5x/d. Meals are not small - for example will have 4 eggs for breakfast with a breakfast meat and a bread. Lunch is 2 chicken pot pies, dinner would be a main dish with sides cooked at home.  Pt does have wounds present on admission that wife reports were acquired while at rehab. Since being home, wife reports that pt has been working with PT, can stand to pivot form his bed to wheelchair and from his wheelchair to other various pieces of furniture. Diligent about doing his muscle strengthening exercises on days when PT does not come to their home.   Discussed current clinical picture with wife and expressed my concern that as he has not had nutrition in several days and is currently too lethargic to eat or  drink that without nutrition a lot of the progress they had made at home the last 2 months was going to be lost. After much discussion, wife is agreeable to cortrak placement 7/25 with the understanding that pt will not be leaving the hospital with the cortrak tube as it is a temporary measure and that it can be removed at she or the patient's request when he is more alert. Also discussed the fact that pt would be able to eat with tube in place when he become more alert.   As pt has been without nutrition for  several days, will initiate and advance TF slowly to monitor for tolerance and signs of refeeding.    Admit / Current weight: 50.3 kg   14% weight loss noted in the last ~6 months (08/2023-02/2024 which is severe)  Nutritionally Relevant Medications: Scheduled Meds:  [START ON 03/01/2024] thiamine   200 mg Oral Daily   Continuous Infusions:  dextrose  5 % and 0.9 % NaCl 75 mL/hr at 02/27/24 9378   thiamine  (VITAMIN B1) injection 500 mg (02/27/24 0149)   PRN Meds: ondansetron , polyethylene glycol  Labs Reviewed: Chloride 114 BUN 32  NUTRITION - FOCUSED PHYSICAL EXAM: Flowsheet Row Most Recent Value  Orbital Region Severe depletion  Upper Arm Region Severe depletion  Thoracic and Lumbar Region Moderate depletion  Buccal Region Severe depletion  Temple Region Severe depletion  Clavicle Bone Region Severe depletion  Clavicle and Acromion Bone Region Moderate depletion  Scapular Bone Region Severe depletion  Dorsal Hand Moderate depletion  Patellar Region Severe depletion  Anterior Thigh Region Severe depletion  Posterior Calf Region Severe depletion  Edema (RD Assessment) None  Hair Reviewed  Eyes Reviewed  Mouth Reviewed  Skin Reviewed  Nails Reviewed    Diet Order:   Diet Order             Diet NPO time specified  Diet effective now                   EDUCATION NEEDS:  Education needs have been addressed  Skin:  Skin Assessment: Reviewed RN Assessment Stage 4:  - sacrum (5 x 3 x 0.4 cm) Stage 1: - Left hip (4.5 x 6 cm) Unstageable: - left heel (3 x 1.5 x 0.5 cm)  Last BM:  7/24 - type 4  Height:  Ht Readings from Last 1 Encounters:  02/25/24 5' 5 (1.651 m)    Weight:  Wt Readings from Last 1 Encounters:  02/27/24 49.4 kg    Ideal Body Weight:  61.8 kg  BMI:  Body mass index is 18.14 kg/m.  Estimated Nutritional Needs:  Kcal:  1700-2000 kcal/d Protein:  80-100g/d Fluid:  1.8-2L/d    Vernell Lukes, RD, LDN, CNSC Registered Dietitian  II Please reach out via secure chat

## 2024-02-27 NOTE — Plan of Care (Signed)
  Problem: Health Behavior/Discharge Planning: Goal: Ability to manage health-related needs will improve Outcome: Progressing   Problem: Clinical Measurements: Goal: Will remain free from infection Outcome: Progressing   Problem: Clinical Measurements: Goal: Respiratory complications will improve Outcome: Progressing   Problem: Activity: Goal: Risk for activity intolerance will decrease Outcome: Progressing   Problem: Nutrition: Goal: Adequate nutrition will be maintained Outcome: Progressing   Problem: Elimination: Goal: Will not experience complications related to bowel motility Outcome: Progressing   Problem: Safety: Goal: Ability to remain free from injury will improve Outcome: Progressing   Problem: Skin Integrity: Goal: Risk for impaired skin integrity will decrease Outcome: Progressing

## 2024-02-28 ENCOUNTER — Inpatient Hospital Stay (HOSPITAL_COMMUNITY)

## 2024-02-28 DIAGNOSIS — R4182 Altered mental status, unspecified: Secondary | ICD-10-CM | POA: Diagnosis not present

## 2024-02-28 DIAGNOSIS — M8610 Other acute osteomyelitis, unspecified site: Secondary | ICD-10-CM | POA: Diagnosis not present

## 2024-02-28 DIAGNOSIS — R627 Adult failure to thrive: Secondary | ICD-10-CM | POA: Diagnosis not present

## 2024-02-28 DIAGNOSIS — Z7189 Other specified counseling: Secondary | ICD-10-CM | POA: Diagnosis not present

## 2024-02-28 LAB — CBC
HCT: 33.8 % — ABNORMAL LOW (ref 39.0–52.0)
Hemoglobin: 11.1 g/dL — ABNORMAL LOW (ref 13.0–17.0)
MCH: 31.6 pg (ref 26.0–34.0)
MCHC: 32.8 g/dL (ref 30.0–36.0)
MCV: 96.3 fL (ref 80.0–100.0)
Platelets: 204 K/uL (ref 150–400)
RBC: 3.51 MIL/uL — ABNORMAL LOW (ref 4.22–5.81)
RDW: 13.8 % (ref 11.5–15.5)
WBC: 10.1 K/uL (ref 4.0–10.5)
nRBC: 0 % (ref 0.0–0.2)

## 2024-02-28 LAB — RENAL FUNCTION PANEL
Albumin: 2.4 g/dL — ABNORMAL LOW (ref 3.5–5.0)
Anion gap: 11 (ref 5–15)
BUN: 23 mg/dL (ref 8–23)
CO2: 21 mmol/L — ABNORMAL LOW (ref 22–32)
Calcium: 8.8 mg/dL — ABNORMAL LOW (ref 8.9–10.3)
Chloride: 114 mmol/L — ABNORMAL HIGH (ref 98–111)
Creatinine, Ser: 0.89 mg/dL (ref 0.61–1.24)
GFR, Estimated: >60 mL/min (ref >60)
Glucose, Bld: 117 mg/dL — ABNORMAL HIGH (ref 70–99)
Phosphorus: 2.5 mg/dL (ref 2.5–4.6)
Potassium: 3.8 mmol/L (ref 3.5–5.1)
Sodium: 146 mmol/L — ABNORMAL HIGH (ref 135–145)

## 2024-02-28 LAB — GLUCOSE, CAPILLARY
Glucose-Capillary: 134 mg/dL — ABNORMAL HIGH (ref 70–99)
Glucose-Capillary: 134 mg/dL — ABNORMAL HIGH (ref 70–99)
Glucose-Capillary: 136 mg/dL — ABNORMAL HIGH (ref 70–99)

## 2024-02-28 LAB — MAGNESIUM: Magnesium: 2.1 mg/dL (ref 1.7–2.4)

## 2024-02-28 LAB — CK: Total CK: 264 U/L (ref 49–397)

## 2024-02-28 MED ORDER — DEXTROSE-SODIUM CHLORIDE 5-0.45 % IV SOLN: INTRAVENOUS | Status: DC

## 2024-02-28 MED ORDER — PROSOURCE TF20 ENFIT COMPATIBL EN LIQD
60.0000 mL | Freq: Every day | ENTERAL | Status: DC
  Administered 2024-02-28 – 2024-03-03 (×5): 60 mL
  Filled 2024-02-28 (×5): qty 60

## 2024-02-28 MED ORDER — OSMOLITE 1.5 CAL PO LIQD
1000.0000 mL | ORAL | Status: DC
  Administered 2024-02-28 – 2024-03-04 (×5): 1000 mL

## 2024-02-28 MED ORDER — FREE WATER
100.0000 mL | Status: DC
  Administered 2024-02-28 – 2024-03-12 (×79): 100 mL

## 2024-02-28 MED ORDER — JUVEN PO PACK
1.0000 | PACK | Freq: Two times a day (BID) | ORAL | Status: DC
  Administered 2024-02-28 – 2024-03-13 (×29): 1
  Filled 2024-02-28 (×29): qty 1

## 2024-02-28 NOTE — Plan of Care (Signed)
  Problem: Clinical Measurements: Goal: Respiratory complications will improve Outcome: Progressing Goal: Cardiovascular complication will be avoided Outcome: Progressing   Problem: Nutrition: Goal: Adequate nutrition will be maintained Outcome: Progressing   Problem: Elimination: Goal: Will not experience complications related to bowel motility Outcome: Progressing   Problem: Skin Integrity: Goal: Risk for impaired skin integrity will decrease Outcome: Progressing

## 2024-02-28 NOTE — Progress Notes (Signed)
 Daily Progress Note   Patient Name: Ernest Campbell       Date: 02/28/2024 DOB: February 04, 1951  Age: 73 y.o. MRN#: [id number] Attending Physician: Kathrin Mignon DASEN, MD Primary Care Physician: Pcp, No Admit Date: 02/25/2024  Reason for Consultation/Follow-up: Establishing goals of care  Subjective: Patient more alert but remains nonverbal  Length of Stay: 1  Current Medications: Scheduled Meds:   collagenase    Topical Daily   enoxaparin  (LOVENOX ) injection  40 mg Subcutaneous QHS   feeding supplement (PROSource TF20)  60 mL Per Tube Daily   free water  100 mL Per Tube Q4H   nutrition supplement (JUVEN)  1 packet Per Tube BID BM   [START ON 03/01/2024] thiamine   200 mg Oral Daily    Continuous Infusions:  cefTRIAXone  (ROCEPHIN )  IV 2 g (02/27/24 1406)   DAPTOmycin  500 mg (02/27/24 1517)   dextrose  5 % and 0.45 % NaCl 65 mL/hr at 02/28/24 0757   feeding supplement (OSMOLITE 1.5 CAL) 1,000 mL (02/28/24 1316)   thiamine  (VITAMIN B1) injection 250 mg (02/28/24 1002)    PRN Meds: acetaminophen  **OR** acetaminophen , albuterol , ondansetron  **OR** ondansetron  (ZOFRAN ) IV, polyethylene glycol  Physical Exam Constitutional:      General: He is not in acute distress.    Appearance: He is ill-appearing.     Comments: Makes eye contact - attempts to move mouth - more interactive than yesterday but still unable to follow commands or speak  Pulmonary:     Effort: Pulmonary effort is normal.  Skin:    General: Skin is warm and dry.             Vital Signs: BP 122/64 (BP Location: Right Arm)   Pulse 81   Temp 98.7 F (37.1 C) (Oral)   Resp 20   Ht 5' 5 (1.651 m)   Wt 49.4 kg Comment: taken on bed, SCD and control box removed  SpO2 96%   BMI 18.14 kg/m  SpO2: SpO2: 96 % O2 Device: O2 Device: Room  Air O2 Flow Rate: O2 Flow Rate (L/min): 1 L/min  Intake/output summary:  Intake/Output Summary (Last 24 hours) at 02/28/2024 1326 Last data filed at 02/28/2024 1317 Gross per 24 hour  Intake 100 ml  Output 1017 ml  Net -917 ml   LBM: Last BM Date : 02/27/24 Baseline Weight: Weight: 50.3 kg Most recent weight: Weight: 49.4 kg (taken on bed, SCD and control box removed)         Patient Active Problem List   Diagnosis Date Noted   PAD (peripheral artery disease) (HCC) 02/27/2024   Subacute osteomyelitis of left foot (HCC) 02/27/2024   Acute metabolic encephalopathy 02/27/2024   Pressure injury of skin 02/27/2024   AMS (altered mental status) 02/25/2024   Toxic metabolic encephalopathy 02/25/2024   Palliative care encounter 11/12/2023   Dehydration 11/12/2023   AKI (acute kidney injury) (HCC) 11/12/2023   Counseling and coordination of care 11/12/2023   FTT (failure to thrive) in adult 11/12/2023   Hypernatremia 11/07/2023   Femur fracture (HCC) 10/24/2023   Protein-calorie malnutrition, severe 09/29/2023   Closed right hip fracture, initial encounter (HCC) 09/25/2023   Cervical spondylosis 09/25/2023   Paraparesis (HCC) 09/25/2023  Macrocytic anemia 09/25/2023   Hypocalcemia 09/25/2023   Altered mental status 07/21/2023   CVA (cerebral vascular accident) (HCC) 07/18/2023   Shuffling gait 07/03/2021   Episodes of formed visual hallucinations 07/03/2021   Nocturnal enuresis 07/03/2021   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 male  with past medical history of parkinsonism, possible Lewy body dementia, anemia, sacral and left heel decubitus admitted on 02/25/2024 with altered mental status since 02/21/2024 after he started Zyprexa  by his neurologist. Workup in ED significant  for lactic acidosis. CT head and MRI brain without acute finding. Left heel x-ray raise concern for osteomyelitis. MRI of left heel informed osteomyelitis.  Orthopedic surgery consulted but family declined surgery and chose nonoperative treatment.  Started on broad-spectrum antibiotics.  ID and palliative medicine consulted.   Assessment: Follow up today. He remains nonverbal but is more alert than yesterday.  Makes eye contact with me today.  Moving extremities more.  Appears to be trying to talk. Wife at bedside.  She is hopeful that mental status continues to improve when he becomes more interactive, closer to his baseline.  We review it is unlikely for him to return to prior baseline, will likely have new baseline.  She understands this.  She speaks with me about her conversation with infectious disease last night.  She feels that amputation was recommended too quickly. We discuss that amputation is typically recommended for osteomyelitis. She understands but is not interested in pursuing amputation. She mentions that as patient becomes more alert she will attempt conversations with him about this. She wants to make it clear that she is leaning away from amputation but will continue conversations regarding this topic.   We also discuss his PO intake. Discuss starting feeds through cortrak. Discuss hope that as his mental status improves he is able to eat by mouth. We discuss what ifs - discuss how to proceed is he is unable to support his nutrition by mouth. We discuss option of peg tube. She is adamant that patient will never have PEG tube - she tells me they discussed this at length prior to current situation and patient was clear he would never want PEG, no matter the circumstances.   She also remains committed to bringing patient home following hospitalization - no facility placement.  Wife has our contact information and will reach out with any needs. PMT will follow up early next week.    Recommendations/Plan: Full code/full scope (would not want ventilator long term) - Open to medical interventions offered to prolong life though she does not want amputation; wants to continue antibiotics  - she does mention that she will attempt to speak to patient about amputation as his mental status improves but wants to be clear she is leaning away from amputation - ongoing discussion is needed - Does not want rehab -wants to return home with support of home health for rehabilitation and IV antibiotics if recommended by medical team  Agrees to short term cortrak - would never want PEG, no matter the circumstances  Care plan was discussed with wife, RD, and Dr Kathrin  Thank you for allowing the Palliative Medicine Team to assist in the care of this patient.   Total Time 40 minutes Prolonged Time Billed  no   Time spent includes: Detailed review  of medical records (labs, imaging, vital signs), medically appropriate exam, discussion with treatment team, counseling and educating patient, family and/or staff, documenting clinical information, medication management and coordination of care.     *Please note that this is a verbal dictation therefore any spelling or grammatical errors are due to the [employer] One system interpretation.  Tobey Jama Barnacle, DNP, [facility] Palliative Medicine Team Team Phone # [phone]  Pager [phone]

## 2024-02-28 NOTE — Plan of Care (Signed)
 Updated patient wife over the phone and in person this afternoon

## 2024-02-28 NOTE — Progress Notes (Signed)
 Patient is retaining urine, Bladder scan showed 451 ml, in and out catheter done, 600 ml tea colored urine drained and cloudy urine at the end of the stream noted.  Wife  made aware that pt may need foley catheter if he continues to retain urine, but she was hesitant for that, only wants In and Out cath at this time, MD notified, will continue to monitor

## 2024-02-28 NOTE — Progress Notes (Signed)
 Brief Nutrition Support Note  Pt underwent cortrak placement this AM. Will order previously planned interventions  INTERVENTION:  Initiate tube feeding via cortrak: Osmolite 1.5 at 50 ml/h (1200 ml per day) Start at 20 and advance by 10mL every 12 hours to reach goal Prosource TF20 60 ml 1x/d Free water: 100mL every 4 hours Provides 1880 kcal, 95 gm protein, 1514 ml free water daily Pt is at risk for refeeding syndrome given severe malnutrition and several days of no nutrition. Monitor magnesium and phosphorus daily x 4 days, MD to replete as needed. 100mg  thiamine  x 5 days 1 packet Juven BID, each packet provides 95 calories, 2.5 grams of protein (collagen) + micronutrients to support wound healing    Will follow-up with pt on tolerance early next week.   Vernell Lukes, RD, LDN, CNSC Registered Dietitian II Please reach out via secure chat

## 2024-02-28 NOTE — TOC CM/SW Note (Signed)
 Transition of Care [facility]) - Inpatient Brief Assessment   Patient Details  Name: Ernest Campbell MRN: [id number] Date of Birth: 11-11-1950  [facility] CM/SW Contact:    [name], RN Phone Number: 02/28/2024, 3:06 PM   Clinical Narrative:  Pt with cortrak.  Palliative are consulted.  IP Care Management following.  Transition of Care Asessment: Insurance and Status: Insurance coverage has been reviewed Patient has primary care physician: No Home environment has been reviewed: home with spouse   Prior/Current Home Services: No current home services Social Drivers of Health Review: SDOH reviewed no interventions necessary Readmission risk has been reviewed: Yes Transition of care needs: transition of care needs identified, TOC will continue to follow

## 2024-02-28 NOTE — Progress Notes (Addendum)
 PROGRESS NOTE  Ernest Campbell FMW:[id number] DOB: 06-15-1951   PCP: Pcp, No  Patient is from: Home.  DOA: 02/25/2024 LOS: 1  Chief complaints Chief Complaint  Patient presents with   Altered Mental Status     Brief Narrative / Interim history: 73 year old M with PMH of parkinsonism, possible Lewy body dementia, anemia, sacral and left heel decubitus presenting with altered mental status since 02/21/2024 after he started Zyprexa  by his neurologist, and admitted with acute toxic and metabolic encephalopathy.  Workup in ED significant for lactic acidosis.  CT head and MRI brain without acute finding.  Left heel x-ray raise concern for osteomyelitis.  Neurology consulted.  The next day, MRI of left heel informed osteomyelitis.  Orthopedic surgery consulted but family declined surgery and chose nonoperative treatment.  Started on broad-spectrum antibiotics.  ID and palliative medicine consulted.  Cortrack placed on 7/25 and started on TF.  Subjective: Seen and examined earlier this morning.  No major events overnight or this morning.  More alert today.  Tries to move his lips but does not interact or talk.  Objective: Vitals:   02/27/24 2336 02/28/24 0329 02/28/24 0735 02/28/24 1116  BP: 113/63 109/65 (!) 112/57 122/64  Pulse: 88 82 76 81  Resp: 12 18 20 20   Temp: 99.1 F (37.3 C) (!) 100.8 F (38.2 C) 98.8 F (37.1 C) 98.7 F (37.1 C)  TempSrc: Oral Oral Oral Oral  SpO2: 98% 100% 100% 96%  Weight:      Height:        Examination:  GENERAL: Very frail. HEENT: MMM.  Vision and hearing seems to be intact. NECK: Supple.  No apparent JVD.  RESP:  No IWOB.  Fair aeration bilaterally. CVS:  RRR. Heart sounds normal.  ABD/GI/GU: BS+. Abd soft, NTND.  MSK/EXT: Left Muscle mass and subcu fat loss.  Left heel ulcer. SKIN: Deep sacral decubitus and left heel ulcer.  See NEURO: Awake and alert.  Does not interact.  No facial asymmetry.  PERRL PSYCH: Calm.  No distress or  agitation.  Consultants:  Neurology Orthopedic surgery Infectious disease Palliative medicine  Procedures: None  Microbiology summarized: COVID-19, influenza and RSV PCR nonreactive Blood cultures NGTD  Assessment and plan: Acute toxic and metabolic encephalopathy: Patient with possible underlying Lewy body dementia.  Temporal association of mental status change with recent initiation of Zyprexa .  Also seems to be on Xanax  at home.  CT head and MRI brain without acute finding.  EEG with moderate diffuse encephalopathy but no seizure or epileptiform discharge. UA does not suggest UTI.   Ammonia, B12, RPR, VBG and TSH unrevealing. -Appreciate input by neurology-signed off -Continue trial of high-dose thiamine  -Cortrack placed and tube feed started on 7/25.  -Continue IV fluids today. -Reorientation and delirium precaution -Palliative medicine on board  Parkinsonism/possible Lewy body dementia:  - Appreciate input by neurology.  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  Patient's wife refused surgical intervention.  CRP elevated to 14.4.  ESR 39. - Continue ceftriaxone  and daptomycin  - Appreciate input by ID -Wound care  Hypernatremia: Na 146. 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  Goal of care discussion: Extensive goals of care discussion with patient's wife on 7/23 and 7/24.  Extensively discussed pros and cons of CPR and intubation in light of patient's condition.  I frankly recommended DNR/DNI.  Patient's wife voiced understanding but she is not ready to make CODE STATUS change yet.  Palliative medicine on board.  Severe malnutrition/failure to thrive Body mass index is 18.14 kg/m. Nutrition Problem: Severe Malnutrition Etiology: social / environmental circumstances (frequent  hospitalizations and SNF stay) Signs/Symptoms: severe muscle depletion, severe fat depletion, percent weight loss (14% x 6 months) Percent weight loss: 14 % Interventions: Juven, MVI, Prostat, Tube feeding  DVT prophylaxis:  enoxaparin  (LOVENOX ) injection 40 mg Start: 02/27/24 2200 SCDs Start: 02/25/24 2358  Code Status: Full code-confirmed with patient's wife Family Communication: None at bedside. Level of care: Telemetry Medical Status is: Inpatient The patient will remain inpatient because: Acute encephalopathy, left heel osteomyelitis and failure to thrive   Final disposition: Home with home health.  Patient's wife not interested in SNF   55 minutes with more than 50% spent in reviewing records, counseling patient/family and coordinating care.   Sch Meds:  Scheduled Meds:  collagenase    Topical Daily   enoxaparin  (LOVENOX ) injection  40 mg Subcutaneous QHS   feeding supplement (PROSource TF20)  60 mL Per Tube Daily   free water  100 mL Per Tube Q4H   nutrition supplement (JUVEN)  1 packet Per Tube BID BM   [START ON 03/01/2024] thiamine   200 mg Oral Daily   Continuous Infusions:  cefTRIAXone  (ROCEPHIN )  IV 2 g (02/27/24 1406)   DAPTOmycin  500 mg (02/27/24 1517)   dextrose  5 % and 0.45 % NaCl 65 mL/hr at 02/28/24 0757   feeding supplement (OSMOLITE 1.5 CAL)     thiamine  (VITAMIN B1) injection 250 mg (02/28/24 1002)   PRN Meds:.acetaminophen  **OR** acetaminophen , albuterol , ondansetron  **OR** ondansetron  (ZOFRAN ) IV, polyethylene glycol  Antimicrobials: Anti-infectives (From admission, onward)    Start     Dose/Rate Route Frequency Ordered Stop   02/27/24 1400  cefTRIAXone  (ROCEPHIN ) 2 g in sodium chloride  0.9 % 100 mL IVPB        2 g 200 mL/hr over 30 Minutes Intravenous Every 24 hours 02/27/24 1247     02/27/24 1400  DAPTOmycin  (CUBICIN ) IVPB 500 mg/51mL premix        500 mg 100 mL/hr over 30 Minutes Intravenous Daily 02/27/24 1247          I have personally  reviewed the following labs and images: CBC: Recent Labs  Lab 02/25/24 1434 02/25/24 1654 02/26/24 0612 02/27/24 0307 02/28/24 0531  WBC 10.6*  --  8.8 10.0 10.1  NEUTROABS 7.8*  --   --   --   --   HGB 13.0 12.2* 11.4* 10.9* 11.1*  HCT 40.2 36.0* 34.3* 33.2* 33.8*  MCV 96.4  --  96.3 97.1 96.3  PLT 295  --  253 250 204   BMP &GFR Recent Labs  Lab 02/25/24 1434 02/25/24 1654 02/26/24 0612 02/27/24 0307 02/28/24 0531  NA 141 142 142 144 146*  K 4.6 4.4 3.9 4.0 3.8  CL 106  --  109 114* 114*  CO2 23  --  23 21* 21*  GLUCOSE 116*  --  106* 102* 117*  BUN 36*  --  35* 32* 23  CREATININE 1.05  --  0.88 0.87 0.89  CALCIUM  9.3  --  9.1 8.9 8.8*  MG  --   --   --  2.0 2.1  PHOS  --   --   --  3.2 2.5   Estimated Creatinine Clearance: 54 mL/min (by C-G formula based on SCr of 0.89 mg/dL).  Liver & Pancreas: Recent Labs  Lab 02/25/24 1434 02/27/24 0307 02/28/24 0531  AST 50*  --   --   ALT 19  --   --   ALKPHOS 79  --   --   BILITOT 1.2  --   --   PROT 6.7  --   --   ALBUMIN 3.3* 2.6* 2.4*   No results for input(s): LIPASE, AMYLASE in the last 168 hours. Recent Labs  Lab 02/25/24 1650  AMMONIA <13   Diabetic: No results for input(s): HGBA1C in the last 72 hours. No results for input(s): GLUCAP in the last 168 hours. Cardiac Enzymes: Recent Labs  Lab 02/28/24 0531  CKTOTAL 264   No results for input(s): PROBNP in the last 8760 hours. Coagulation Profile: No results for input(s): INR, PROTIME in the last 168 hours. Thyroid Function Tests: Recent Labs    02/26/24 1607  TSH 1.431   Lipid Profile: No results for input(s): CHOL, HDL, LDLCALC, TRIG, CHOLHDL, LDLDIRECT in the last 72 hours. Anemia Panel: Recent Labs    02/26/24 1607  VITAMINB12 481   Urine analysis:    Component Value Date/Time   COLORURINE YELLOW 02/25/2024 1703   APPEARANCEUR CLEAR 02/25/2024 1703   LABSPEC 1.024 02/25/2024 1703   PHURINE 5.0 02/25/2024  1703   GLUCOSEU NEGATIVE 02/25/2024 1703   HGBUR NEGATIVE 02/25/2024 1703   BILIRUBINUR NEGATIVE 02/25/2024 1703   KETONESUR NEGATIVE 02/25/2024 1703   PROTEINUR NEGATIVE 02/25/2024 1703   NITRITE NEGATIVE 02/25/2024 1703   LEUKOCYTESUR NEGATIVE 02/25/2024 1703   Sepsis Labs: Invalid input(s): PROCALCITONIN, LACTICIDVEN  Microbiology: Recent Results (from the past 240 hours)  Resp panel by RT-PCR (RSV, Flu A&B, Covid) Anterior Nasal Swab     Status: None   Collection Time: 02/25/24  3:49 PM   Specimen: Anterior Nasal Swab  Result Value Ref Range Status   SARS Coronavirus 2 by RT PCR NEGATIVE NEGATIVE Final   Influenza A by PCR NEGATIVE NEGATIVE Final   Influenza B by PCR NEGATIVE NEGATIVE Final    Comment: (NOTE) The Xpert Xpress SARS-CoV-2/FLU/RSV plus assay is intended as an aid in the diagnosis of influenza from Nasopharyngeal swab specimens and should not be used as a sole basis for treatment. Nasal washings and aspirates are unacceptable for Xpert Xpress SARS-CoV-2/FLU/RSV testing.  Fact Sheet for Patients: BloggerCourse.com  Fact Sheet for Healthcare Providers: SeriousBroker.it  This test is not yet approved or cleared by the [place]  FDA and has been authorized for detection and/or diagnosis of SARS-CoV-2 by FDA under an Emergency Use Authorization (EUA). This EUA will remain in effect (meaning this test can be used) for the duration of the COVID-19 declaration under Section 564(b)(1) of the Act, 21 U.S.C. section 360bbb-3(b)(1), unless the authorization is terminated or revoked.     Resp Syncytial Virus by PCR NEGATIVE NEGATIVE Final    Comment: (NOTE) Fact Sheet for Patients: BloggerCourse.com  Fact Sheet for Healthcare Providers: SeriousBroker.it  This test is not yet approved or cleared by the [place]  FDA and has been authorized for detection  and/or diagnosis of SARS-CoV-2 by FDA under an Emergency Use Authorization (EUA). This EUA will remain in effect (meaning this test can be used) for the duration of the COVID-19 declaration under Section 564(b)(1) of the Act, 21 U.S.C. section 360bbb-3(b)(1), unless the authorization is terminated or revoked.  Performed at [facility], [address], [place], [place] [address]   Blood culture (routine x 2)  Status: None (Preliminary result)   Collection Time: 02/25/24  5:00 PM   Specimen: BLOOD RIGHT HAND  Result Value Ref Range Status   Specimen Description BLOOD RIGHT HAND  Final   Special Requests   Final    BOTTLES DRAWN AEROBIC ONLY Blood Culture results may not be optimal due to an inadequate volume of blood received in culture bottles   Culture   Final    NO GROWTH 3 DAYS Performed at [facility], [address], [place], [place] [address]    Report Status PENDING  Incomplete  Blood culture (routine x 2)     Status: None (Preliminary result)   Collection Time: 02/25/24  5:10 PM   Specimen: BLOOD LEFT HAND  Result Value Ref Range Status   Specimen Description BLOOD LEFT HAND  Final   Special Requests   Final    BOTTLES DRAWN AEROBIC ONLY Blood Culture results may not be optimal due to an inadequate volume of blood received in culture bottles   Culture   Final    NO GROWTH 3 DAYS Performed at [facility], [address] [place], [place] [address]    Report Status PENDING  Incomplete    Radiology Studies: DG CHEST PORT 1 VIEW Result Date: 02/28/2024 CLINICAL DATA:  Pneumonia. EXAM: PORTABLE CHEST 1 VIEW COMPARISON:  Chest radiograph dated 02/25/2024. FINDINGS: Enteric tube extends below diaphragm with tip beyond the inferior margin of the image. Minimal left lung base atelectasis or infiltrate. Background of emphysema. No pleural effusion or pneumothorax. The cardiac silhouette is within normal limits. No acute osseous pathology. IMPRESSION: Minimal  left lung base atelectasis or infiltrate. Electronically Signed   By: Vanetta Chou M.D.   On: 02/28/2024 12:36       Tykeisha Peer T. Copeland [facility]  If 7PM-7AM, please contact night-coverage [email or web address] 02/28/2024, 1:09 PM

## 2024-02-28 NOTE — Procedures (Signed)
 Cortrak  Person Inserting Tube:  Norvel Beer, RD Tube Type:  Cortrak - 43 inches Tube Size:  10 Tube Location:  Left nare Secured by: Bridle Technique Used to Measure Tube Placement:  Marking at nare/corner of mouth Cortrak Secured At:  76 cm   Cortrak Tube Team Note:  Consult received to place a Cortrak feeding tube.   No x-ray is required. RN may begin using tube.   If the tube becomes dislodged please keep the tube and contact the Cortrak team at [email or web address] for replacement.  If after hours and replacement cannot be delayed, place a NG tube and confirm placement with an abdominal x-ray.    Norvel Beer MS, RDN, LDN, CNSC Registered Dietitian 3 Clinical Nutrition RD Inpatient Contact Info in Amion

## 2024-02-28 NOTE — Progress Notes (Signed)
 [facility]  Date of Admission:  02/25/2024   Total days of inpatient antibiotics 3  Principal Problem:   Altered mental status Active Problems:   AMS (altered mental status)   Toxic metabolic encephalopathy   PAD (peripheral artery disease) (HCC)   Subacute osteomyelitis of left foot (HCC)   Acute metabolic encephalopathy   Pressure injury of skin          Assessment: 73 year old male with past medical history of anemia, possible parkinsonism,, Lewy body dementia, admitted with altered mental status, recently started on Zyprexa  ID engaged as found to have #Fevers in the setting of left calcaneal osteomyelitis and necrotic ulcer MRI showed osteomyelitis of the calcaneus, status orthopedics noted foul-smelling odor from necrotic ulcer of left heel - Orthopedics engaged, patient with noted nonoperative treatment.  ABIs canceled.  ID engaged.  -Seen by wound care Recommendations:- - Follow blood cultures -Okay to continue daptomycin  and ceftriaxone  at this point as patient continues to fever and blood cultures are pending -CXR showed minimal left lung base atelectasis or infiltrate.  Will get CT chest  -Closer to discharge switch to doxycycline and Augmentin x 6 weeks for osteomyelitis.  Extensively communicated with patient and wife that calcaneal osteomyelitis is not curative with antibiotics alone.  Recommend wound care.  Wife requested ABIs.,  Communicated with primary -Standard precautions   Microbiology:   Antibiotics:  Cultures: Blood  Urine  Other   SUBJECTIVE: Rstin in bed Interval: 100.8 overnight  Review of Systems: Review of Systems  All other systems reviewed and are negative.    Scheduled Meds:  collagenase    Topical Daily   enoxaparin  (LOVENOX ) injection  40 mg Subcutaneous QHS   feeding supplement (PROSource TF20)  60 mL Per Tube Daily   free water  100 mL Per Tube Q4H   nutrition supplement (JUVEN)  1 packet  Per Tube BID BM   [START ON 03/01/2024] thiamine   200 mg Oral Daily   Continuous Infusions:  cefTRIAXone  (ROCEPHIN )  IV 2 g (02/28/24 1419)   DAPTOmycin  Stopped (02/28/24 1418)   dextrose  5 % and 0.45 % NaCl 65 mL/hr at 02/28/24 0757   feeding supplement (OSMOLITE 1.5 CAL) 1,000 mL (02/28/24 1316)   thiamine  (VITAMIN B1) injection 250 mg (02/28/24 2107)   PRN Meds:.acetaminophen  **OR** acetaminophen , albuterol , ondansetron  **OR** ondansetron  (ZOFRAN ) IV, polyethylene glycol Allergies  Allergen Reactions   Fentanyl  And Related Anaphylaxis   Other Other (See Comments)    According to the patient, all opioids cause hallucinations    Prednisone  Other (See Comments)    Hallucinations - Allergic, per [facility]   Benadryl  [Diphenhydramine ] Other (See Comments)    hallucinations   Codeine Other (See Comments)    Hallucinations - Allergic, per [facility]   Morphine  Other (See Comments)    Hallucinations - Allergic, per [facility]   Oxycodone Other (See Comments)    Hallucinations - Allergic, per [facility]    OBJECTIVE: Vitals:   02/28/24 0735 02/28/24 1116 02/28/24 1515 02/28/24 2013  BP: (!) 112/57 122/64 (!) 118/59 (!) 121/57  Pulse: 76 81 79 93  Resp: 20 20 20 16   Temp: 98.8 F (37.1 C) 98.7 F (37.1 C) 98.8 F (37.1 C) (!) 100.7 F (38.2 C)  TempSrc: Oral Oral Oral Oral  SpO2: 100% 96% 98% 97%  Weight:      Height:       Body mass index is 18.14 kg/m.  Physical Exam Constitutional:  General: He is not in acute distress.    Appearance: He is not toxic-appearing.  HENT:     Head: Normocephalic and atraumatic.     Right Ear: External ear normal.     Left Ear: External ear normal.     Nose: No congestion or rhinorrhea.     Mouth/Throat:     Mouth: Mucous membranes are moist.     Pharynx: Oropharynx is clear.  Eyes:     Extraocular Movements: Extraocular movements intact.     Conjunctiva/sclera: Conjunctivae normal.     Pupils: Pupils are equal, round, and reactive to light.   Cardiovascular:     Rate and Rhythm: Normal rate and regular rhythm.     Heart sounds: No murmur heard.    No friction rub. No gallop.  Pulmonary:     Effort: Pulmonary effort is normal.     Breath sounds: Normal breath sounds.  Abdominal:     General: Abdomen is flat. Bowel sounds are normal.     Palpations: Abdomen is soft.  Musculoskeletal:        General: No swelling.     Cervical back: Normal range of motion and neck supple.     Comments: Heel wound  in boot  Skin:    General: Skin is warm and dry.  Psychiatric:        Mood and Affect: Mood normal.       Lab Results Lab Results  Component Value Date   WBC 10.1 02/28/2024   HGB 11.1 (L) 02/28/2024   HCT 33.8 (L) 02/28/2024   MCV 96.3 02/28/2024   PLT 204 02/28/2024    Lab Results  Component Value Date   CREATININE 0.89 02/28/2024   BUN 23 02/28/2024   NA 146 (H) 02/28/2024   K 3.8 02/28/2024   CL 114 (H) 02/28/2024   CO2 21 (L) 02/28/2024    Lab Results  Component Value Date   ALT 19 02/25/2024   AST 50 (H) 02/25/2024   ALKPHOS 79 02/25/2024   BILITOT 1.2 02/25/2024        Loney Stank, MD [facility] 02/28/2024, 10:10 PM Evaluation of this patient requires complex antimicrobial therapy evaluation and counseling + isolation needs for disease transmission risk assessment and mitigation

## 2024-02-29 ENCOUNTER — Encounter (HOSPITAL_COMMUNITY)

## 2024-02-29 ENCOUNTER — Inpatient Hospital Stay (HOSPITAL_COMMUNITY)

## 2024-02-29 DIAGNOSIS — M8610 Other acute osteomyelitis, unspecified site: Secondary | ICD-10-CM | POA: Diagnosis not present

## 2024-02-29 DIAGNOSIS — R627 Adult failure to thrive: Secondary | ICD-10-CM | POA: Diagnosis not present

## 2024-02-29 DIAGNOSIS — Z7189 Other specified counseling: Secondary | ICD-10-CM | POA: Diagnosis not present

## 2024-02-29 DIAGNOSIS — R4182 Altered mental status, unspecified: Secondary | ICD-10-CM | POA: Diagnosis not present

## 2024-02-29 LAB — GLUCOSE, CAPILLARY
Glucose-Capillary: 92 mg/dL (ref 70–99)
Glucose-Capillary: 148 mg/dL — ABNORMAL HIGH (ref 70–99)
Glucose-Capillary: 130 mg/dL — ABNORMAL HIGH (ref 70–99)
Glucose-Capillary: 130 mg/dL — ABNORMAL HIGH (ref 70–99)
Glucose-Capillary: 157 mg/dL — ABNORMAL HIGH (ref 70–99)

## 2024-02-29 LAB — BASIC METABOLIC PANEL WITH GFR
Anion gap: 11 (ref 5–15)
BUN: 24 mg/dL — ABNORMAL HIGH (ref 8–23)
CO2: 22 mmol/L (ref 22–32)
Calcium: 8.5 mg/dL — ABNORMAL LOW (ref 8.9–10.3)
Chloride: 109 mmol/L (ref 98–111)
Creatinine, Ser: 0.72 mg/dL (ref 0.61–1.24)
GFR, Estimated: >60 mL/min (ref >60)
Glucose, Bld: 150 mg/dL — ABNORMAL HIGH (ref 70–99)
Potassium: 3 mmol/L — ABNORMAL LOW (ref 3.5–5.1)
Sodium: 142 mmol/L (ref 135–145)

## 2024-02-29 LAB — CBC
HCT: 30.1 % — ABNORMAL LOW (ref 39.0–52.0)
Hemoglobin: 9.9 g/dL — ABNORMAL LOW (ref 13.0–17.0)
MCH: 31.2 pg (ref 26.0–34.0)
MCHC: 32.9 g/dL (ref 30.0–36.0)
MCV: 95 fL (ref 80.0–100.0)
Platelets: 261 K/uL (ref 150–400)
RBC: 3.17 MIL/uL — ABNORMAL LOW (ref 4.22–5.81)
RDW: 13.7 % (ref 11.5–15.5)
WBC: 7.9 K/uL (ref 4.0–10.5)
nRBC: 0 % (ref 0.0–0.2)

## 2024-02-29 LAB — MAGNESIUM: Magnesium: 1.7 mg/dL (ref 1.7–2.4)

## 2024-02-29 LAB — PHOSPHORUS: Phosphorus: 2.8 mg/dL (ref 2.5–4.6)

## 2024-02-29 MED ORDER — ORAL CARE MOUTH RINSE
15.0000 mL | OROMUCOSAL | Status: DC
  Administered 2024-02-29 – 2024-03-16 (×62): 15 mL via OROMUCOSAL

## 2024-02-29 MED ORDER — KCL IN DEXTROSE-NACL 20-5-0.45 MEQ/L-%-% IV SOLN
INTRAVENOUS | Status: AC
  Filled 2024-02-29 (×2): qty 1000

## 2024-02-29 MED ORDER — CHLORHEXIDINE GLUCONATE CLOTH 2 % EX PADS
6.0000 | MEDICATED_PAD | Freq: Every day | CUTANEOUS | Status: DC
  Administered 2024-02-29 – 2024-03-16 (×17): 6 via TOPICAL

## 2024-02-29 MED ORDER — ORAL CARE MOUTH RINSE: 15.0000 mL | OROMUCOSAL | Status: DC | PRN

## 2024-02-29 MED ORDER — DOXAZOSIN MESYLATE 1 MG PO TABS
1.0000 mg | ORAL_TABLET | Freq: Every day | ORAL | Status: DC
  Administered 2024-02-29 – 2024-03-13 (×14): 1 mg
  Filled 2024-02-29 (×14): qty 1

## 2024-02-29 MED ORDER — POTASSIUM CHLORIDE 10 MEQ/100ML IV SOLN
10.0000 meq | INTRAVENOUS | Status: AC
  Administered 2024-02-29 (×4): 10 meq via INTRAVENOUS
  Filled 2024-02-29 (×4): qty 100

## 2024-02-29 NOTE — Progress Notes (Signed)
 Patient is back to the unit.

## 2024-02-29 NOTE — Progress Notes (Signed)
 PROGRESS NOTE  BORUCH MANUELE FMW:[id number] DOB: [birth year]   PCP: Pcp, No  Patient is from: Home.  DOA: 02/25/2024 LOS: 2  Chief complaints Chief Complaint  Patient presents with   Altered Mental Status     Brief Narrative / Interim history: 73 year old M with PMH of parkinsonism, possible Lewy body dementia, anemia, sacral and left heel decubitus presenting with altered mental status since 02/21/2024 after he started Zyprexa  by his neurologist, and admitted with acute toxic and metabolic encephalopathy.  Workup in ED significant for lactic acidosis.  CT head and MRI brain without acute finding.  Left heel x-ray raise concern for osteomyelitis.  Neurology consulted.  The next day, MRI of left heel informed osteomyelitis.  Orthopedic surgery consulted but family declined surgery and chose nonoperative treatment.  Started on broad-spectrum antibiotics.  ID and palliative medicine consulted.  Cortrack placed on 7/25 and started on TF.  Subjective: Seen and examined earlier this morning.  No major events overnight or this morning.  Awake but not quite alert today.  Does not interact or follow commands.  Does not appear to be in distress.  Objective: Vitals:   02/28/24 2353 02/29/24 0323 02/29/24 0750 02/29/24 1151  BP: 107/73 (!) 120/52 124/68 (!) 127/55  Pulse: 87 81 80 72  Resp: 18 18 20 20   Temp: 100.2 F (37.9 C) 100.2 F (37.9 C) 98.2 F (36.8 C) 97.7 F (36.5 C)  TempSrc: Oral Oral Oral Oral  SpO2: 100% 100% 100% 100%  Weight:      Height:        Examination:  GENERAL: Very frail. HEENT: MMM.  Vision and hearing seems to be intact.  Feeding tube in place. NECK: Supple.  No apparent JVD.  RESP:  No IWOB.  Fair aeration bilaterally. CVS:  RRR. Heart sounds normal.  ABD/GI/GU: BS+. Abd soft, NTND.  MSK/EXT: Left Muscle mass and subcu fat loss.  Left heel ulcer. SKIN: Deep sacral decubitus and left heel ulcer.  See NEURO: Awake.  Does not interact.  No facial  asymmetry.  PERRL PSYCH: Calm.  No distress or agitation.  Consultants:  Neurology Orthopedic surgery Infectious disease Palliative medicine  Procedures: None  Microbiology summarized: COVID-19, influenza and RSV PCR nonreactive Blood cultures NGTD  Assessment and plan: Acute toxic and metabolic encephalopathy: Patient with possible underlying Lewy body dementia.  Temporal association of mental status change with recent initiation of Zyprexa .  Also seems to be on Xanax  at home.  CT head and MRI brain without acute finding.  EEG with moderate diffuse encephalopathy but no seizure or epileptiform discharge. UA does not suggest UTI.   Ammonia, B12, RPR, VBG and TSH unrevealing. -Appreciate input by neurology-signed off -Continue trial of high-dose thiamine  -Cortrack placed and tube feed started on 7/25.  -Continue IV fluids today. 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  CRP elevated to 14.4.  ESR 39. Patient's wife refused amputation but requests ABI.  She is open to vascular intervention if indicated and feasible. -Continue ceftriaxone  and daptomycin  -Appreciate input by ID -Wound care -ABI  Acute urinary retention: Patient's wife reports history of enlarged prostate.  No prior abdominal imaging.  He had multiple I&O.  Initially, patient's wife refused indwelling Foley catheter but she later agrees after our discussion - Start Cardura  1 mg daily per tube.  Cannot do Flomax since NPO. - Insert Foley catheter  Goal  of care discussion: Extensive goals of care discussion with patient's wife on 7/23 and 7/24.  Extensively discussed pros and cons of CPR and intubation in light of patient's condition.  I frankly recommended DNR/DNI.  Patient's wife voiced understanding but she is not ready to make CODE STATUS change yet.  Palliative medicine on  board.  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  Parkinsonism/possible Lewy body dementia:  - Appreciate input by neurology.  Outpatient follow-up  Hypokalemia -Monitor replenish K and Mg as appropriate  Hyponatremia: Resolved.  Severe malnutrition/failure to thrive Body mass index is 18.14 kg/m. Nutrition Problem: Severe Malnutrition Etiology: social / environmental circumstances (frequent hospitalizations and SNF stay) Signs/Symptoms: severe muscle depletion, severe fat depletion, percent weight loss (14% x 6 months) Percent weight loss: 14 % Interventions: Juven, MVI, Prostat, Tube feeding  DVT prophylaxis:  enoxaparin  (LOVENOX ) injection 40 mg Start: 02/27/24 2200 SCDs Start: 02/25/24 2358  Code Status: Full code-confirmed with patient's wife Family Communication: Updated patient's wife over the phone. Level of care: Med-Surg Status is: Inpatient The patient will remain inpatient because: Acute encephalopathy, left heel osteomyelitis and failure to thrive   Final disposition: Home with home health.  Patient's wife not interested in SNF   55 minutes with more than 50% spent in reviewing records, counseling patient/family and coordinating care.   Sch Meds:  Scheduled Meds:  Chlorhexidine  Gluconate Cloth  6 each Topical Daily   collagenase    Topical Daily   doxazosin   1 mg Per Tube Daily   enoxaparin  (LOVENOX ) injection  40 mg Subcutaneous QHS   feeding supplement (PROSource TF20)  60 mL Per Tube Daily   free water   100 mL Per Tube Q4H   nutrition supplement (JUVEN)  1 packet Per Tube BID BM   mouth rinse  15 mL Mouth Rinse 4 times per day   [START ON 03/01/2024] thiamine   200 mg Oral Daily   Continuous Infusions:  cefTRIAXone  (ROCEPHIN )  IV 2 g (02/29/24 1339)   DAPTOmycin  Stopped (02/28/24 1418)   dextrose  5 % and 0.45 % NaCl with KCl 20 mEq/L 65 mL/hr at 02/29/24 0920   feeding supplement (OSMOLITE 1.5 CAL) 40 mL/hr at  02/29/24 1330   thiamine  (VITAMIN B1) injection 250 mg (02/29/24 1349)   PRN Meds:.acetaminophen  **OR** acetaminophen , albuterol , ondansetron  **OR** ondansetron  (ZOFRAN ) IV, mouth rinse, polyethylene glycol  Antimicrobials: Anti-infectives (From admission, onward)    Start     Dose/Rate Route Frequency Ordered Stop   02/27/24 1400  cefTRIAXone  (ROCEPHIN ) 2 g in sodium chloride  0.9 % 100 mL IVPB        2 g 200 mL/hr over 30 Minutes Intravenous Every 24 hours 02/27/24 1247     02/27/24 1400  DAPTOmycin  (CUBICIN ) IVPB 500 mg/44mL premix        500 mg 100 mL/hr over 30 Minutes Intravenous Daily 02/27/24 1247          I have personally reviewed the following labs and images: CBC: Recent Labs  Lab 02/25/24 1434 02/25/24 1654 02/26/24 0612 02/27/24 0307 02/28/24 0531 02/29/24 0315  WBC 10.6*  --  8.8 10.0 10.1 7.9  NEUTROABS 7.8*  --   --   --   --   --   HGB 13.0 12.2* 11.4* 10.9* 11.1* 9.9*  HCT 40.2 36.0* 34.3* 33.2* 33.8* 30.1*  MCV 96.4  --  96.3 97.1 96.3 95.0  PLT 295  --  253 250 204 261   BMP &  GFR Recent Labs  Lab 02/25/24 1434 02/25/24 1654 02/26/24 0612 02/27/24 0307 02/28/24 0531 02/29/24 0315  NA 141 142 142 144 146* 142  K 4.6 4.4 3.9 4.0 3.8 3.0*  CL 106  --  109 114* 114* 109  CO2 23  --  23 21* 21* 22  GLUCOSE 116*  --  106* 102* 117* 150*  BUN 36*  --  35* 32* 23 24*  CREATININE 1.05  --  0.88 0.87 0.89 0.72  CALCIUM  9.3  --  9.1 8.9 8.8* 8.5*  MG  --   --   --  2.0 2.1 1.7  PHOS  --   --   --  3.2 2.5 2.8   Estimated Creatinine Clearance: 60 mL/min (by C-G formula based on SCr of 0.72 mg/dL). Liver & Pancreas: Recent Labs  Lab 02/25/24 1434 02/27/24 0307 02/28/24 0531  AST 50*  --   --   ALT 19  --   --   ALKPHOS 79  --   --   BILITOT 1.2  --   --   PROT 6.7  --   --   ALBUMIN 3.3* 2.6* 2.4*   No results for input(s): LIPASE, AMYLASE in the last 168 hours. Recent Labs  Lab 02/25/24 1650  AMMONIA <13   Diabetic: No results  for input(s): HGBA1C in the last 72 hours. Recent Labs  Lab 02/28/24 2013 02/28/24 2350 02/29/24 0323 02/29/24 0750 02/29/24 1148  GLUCAP 134* 134* 92 148* 130*   Cardiac Enzymes: Recent Labs  Lab 02/28/24 0531  CKTOTAL 264   No results for input(s): PROBNP in the last 8760 hours. Coagulation Profile: No results for input(s): INR, PROTIME in the last 168 hours. Thyroid Function Tests: Recent Labs    02/26/24 1607  TSH 1.431   Lipid Profile: No results for input(s): CHOL, HDL, LDLCALC, TRIG, CHOLHDL, LDLDIRECT in the last 72 hours. Anemia Panel: Recent Labs    02/26/24 1607  VITAMINB12 481   Urine analysis:    Component Value Date/Time   COLORURINE YELLOW 02/25/2024 1703   APPEARANCEUR CLEAR 02/25/2024 1703   LABSPEC 1.024 02/25/2024 1703   PHURINE 5.0 02/25/2024 1703   GLUCOSEU NEGATIVE 02/25/2024 1703   HGBUR NEGATIVE 02/25/2024 1703   BILIRUBINUR NEGATIVE 02/25/2024 1703   KETONESUR NEGATIVE 02/25/2024 1703   PROTEINUR NEGATIVE 02/25/2024 1703   NITRITE NEGATIVE 02/25/2024 1703   LEUKOCYTESUR NEGATIVE 02/25/2024 1703   Sepsis Labs: Invalid input(s): PROCALCITONIN, LACTICIDVEN  Microbiology: Recent Results (from the past 240 hours)  Resp panel by RT-PCR (RSV, Flu A&B, Covid) Anterior Nasal Swab     Status: None   Collection Time: 02/25/24  3:49 PM   Specimen: Anterior Nasal Swab  Result Value Ref Range Status   SARS Coronavirus 2 by RT PCR NEGATIVE NEGATIVE Final   Influenza A by PCR NEGATIVE NEGATIVE Final   Influenza B by PCR NEGATIVE NEGATIVE Final    Comment: (NOTE) The Xpert Xpress SARS-CoV-2/FLU/RSV plus assay is intended as an aid in the diagnosis of influenza from Nasopharyngeal swab specimens and should not be used as a sole basis for treatment. Nasal washings and aspirates are unacceptable for Xpert Xpress SARS-CoV-2/FLU/RSV testing.  Fact Sheet for Patients: BloggerCourse.com  Fact Sheet  for Healthcare Providers: SeriousBroker.it  This test is not yet approved or cleared by the [place]  FDA and has been authorized for detection and/or diagnosis of SARS-CoV-2 by FDA under an Emergency Use Authorization (EUA). This EUA will remain in effect (meaning this  test can be used) for the duration of the COVID-19 declaration under Section 564(b)(1) of the Act, 21 U.S.C. section 360bbb-3(b)(1), unless the authorization is terminated or revoked.     Resp Syncytial Virus by PCR NEGATIVE NEGATIVE Final    Comment: (NOTE) Fact Sheet for Patients: BloggerCourse.com  Fact Sheet for Healthcare Providers: SeriousBroker.it  This test is not yet approved or cleared by the [place]  FDA and has been authorized for detection and/or diagnosis of SARS-CoV-2 by FDA under an Emergency Use Authorization (EUA). This EUA will remain in effect (meaning this test can be used) for the duration of the COVID-19 declaration under Section 564(b)(1) of the Act, 21 U.S.C. section 360bbb-3(b)(1), unless the authorization is terminated or revoked.  Performed at [facility], [address] [place], [place] [address]   Blood culture (routine x 2)     Status: None (Preliminary result)   Collection Time: 02/25/24  5:00 PM   Specimen: BLOOD RIGHT HAND  Result Value Ref Range Status   Specimen Description BLOOD RIGHT HAND  Final   Special Requests   Final    BOTTLES DRAWN AEROBIC ONLY Blood Culture results may not be optimal due to an inadequate volume of blood received in culture bottles   Culture   Final    NO GROWTH 3 DAYS Performed at [facility], [address], [place], [place] [address]    Report Status PENDING  Incomplete  Blood culture (routine x 2)     Status: None (Preliminary result)   Collection Time: 02/25/24  5:10 PM   Specimen: BLOOD LEFT HAND  Result Value Ref Range Status   Specimen  Description BLOOD LEFT HAND  Final   Special Requests   Final    BOTTLES DRAWN AEROBIC ONLY Blood Culture results may not be optimal due to an inadequate volume of blood received in culture bottles   Culture   Final    NO GROWTH 3 DAYS Performed at [facility], [address], [place], [place] [address]    Report Status PENDING  Incomplete    Radiology Studies: CT CHEST WO CONTRAST Result Date: 02/29/2024 CLINICAL DATA:  Pneumonia EXAM: CT CHEST WITHOUT CONTRAST TECHNIQUE: Multidetector CT imaging of the chest was performed following the standard protocol without IV contrast. RADIATION DOSE REDUCTION: This exam was performed according to the departmental dose-optimization program which includes automated exposure control, adjustment of the mA and/or kV according to patient size and/or use of iterative reconstruction technique. COMPARISON:  Radiographs 02/28/2024 FINDINGS: Cardiovascular: Coronary, aortic arch, and branch vessel atherosclerotic vascular disease. Mitral valve calcification. Mediastinum/Nodes: A feeding tube terminates in the duodenal bulb. Lungs/Pleura: Airway thickening is present especially in both lower lobes with some degree of airway plugging more notable in the left lower lobe, and bandlike regions of volume loss and mild airspace opacity in both lower lobes favoring atelectasis although aspiration pneumonitis is included in the differential diagnosis. Upper Abdomen: Unremarkable Musculoskeletal: Left greater than right degenerative glenohumeral arthropathy. IMPRESSION: 1. Airway thickening is present especially in both lower lobes with some degree of airway plugging more notable in the left lower lobe, and bandlike regions of volume loss and mild airspace opacity in both lower lobes favoring atelectasis although aspiration pneumonitis is included in the differential diagnosis. 2. Mitral valve calcification. 3. Left greater than right degenerative glenohumeral arthropathy. 4.   Aortic Atherosclerosis (ICD10-I70.0). Electronically Signed   By: Ryan Salvage M.D.   On: 02/29/2024 12:39  Adom Schoeneck T. Annamaria Salah [facility]  If 7PM-7AM, please contact night-coverage [email or web address] 02/29/2024, 2:11 PM

## 2024-02-29 NOTE — Progress Notes (Signed)
Patient is off the unit for CT.

## 2024-02-29 NOTE — Progress Notes (Signed)
 ID brief note   Afebrile in the last 24 hrs    CT Chest findings noted  1. Airway thickening is present especially in both lower lobes with some degree of airway plugging more notable in the left lower lobe, and bandlike regions of volume loss and mild airspace opacity in both lower lobes favoring atelectasis although aspiration pneumonitis is included in the differential diagnosis. 2. Mitral valve calcification. 3. Left greater than right degenerative glenohumeral arthropathy. 4.  Aortic Atherosclerosis (ICD10-I70.0).  New ID team starting from 7/28  Annalee Orem, MD Infectious Disease Physician [facility] [address] [place], [place] [address] Phone: [phone]  Fax: [phone]

## 2024-02-29 NOTE — Plan of Care (Signed)
   Problem: Clinical Measurements: Goal: Respiratory complications will improve Outcome: Progressing Goal: Cardiovascular complication will be avoided Outcome: Progressing   Problem: Activity: Goal: Risk for activity intolerance will decrease Outcome: Progressing   Problem: Nutrition: Goal: Adequate nutrition will be maintained Outcome: Progressing

## 2024-02-29 NOTE — Progress Notes (Addendum)
 14 Fr. Foley catheter placed for retention per order, patient tolerated well, will continue to monitor

## 2024-03-01 ENCOUNTER — Inpatient Hospital Stay (HOSPITAL_COMMUNITY)

## 2024-03-01 DIAGNOSIS — L97509 Non-pressure chronic ulcer of other part of unspecified foot with unspecified severity: Secondary | ICD-10-CM | POA: Diagnosis not present

## 2024-03-01 DIAGNOSIS — R4182 Altered mental status, unspecified: Secondary | ICD-10-CM | POA: Diagnosis not present

## 2024-03-01 DIAGNOSIS — M8610 Other acute osteomyelitis, unspecified site: Secondary | ICD-10-CM | POA: Diagnosis not present

## 2024-03-01 DIAGNOSIS — R627 Adult failure to thrive: Secondary | ICD-10-CM | POA: Diagnosis not present

## 2024-03-01 DIAGNOSIS — Z7189 Other specified counseling: Secondary | ICD-10-CM | POA: Diagnosis not present

## 2024-03-01 LAB — GLUCOSE, CAPILLARY
Glucose-Capillary: 110 mg/dL — ABNORMAL HIGH (ref 70–99)
Glucose-Capillary: 116 mg/dL — ABNORMAL HIGH (ref 70–99)
Glucose-Capillary: 135 mg/dL — ABNORMAL HIGH (ref 70–99)
Glucose-Capillary: 144 mg/dL — ABNORMAL HIGH (ref 70–99)
Glucose-Capillary: 148 mg/dL — ABNORMAL HIGH (ref 70–99)
Glucose-Capillary: 149 mg/dL — ABNORMAL HIGH (ref 70–99)
Glucose-Capillary: 160 mg/dL — ABNORMAL HIGH (ref 70–99)

## 2024-03-01 LAB — BASIC METABOLIC PANEL WITH GFR
Anion gap: 9 (ref 5–15)
BUN: 25 mg/dL — ABNORMAL HIGH (ref 8–23)
CO2: 22 mmol/L (ref 22–32)
Calcium: 8.1 mg/dL — ABNORMAL LOW (ref 8.9–10.3)
Chloride: 107 mmol/L (ref 98–111)
Creatinine, Ser: 0.7 mg/dL (ref 0.61–1.24)
GFR, Estimated: >60 mL/min (ref >60)
Glucose, Bld: 153 mg/dL — ABNORMAL HIGH (ref 70–99)
Potassium: 4 mmol/L (ref 3.5–5.1)
Sodium: 138 mmol/L (ref 135–145)

## 2024-03-01 LAB — MAGNESIUM: Magnesium: 1.6 mg/dL — ABNORMAL LOW (ref 1.7–2.4)

## 2024-03-01 LAB — CBC
HCT: 26.6 % — ABNORMAL LOW (ref 39.0–52.0)
Hemoglobin: 8.9 g/dL — ABNORMAL LOW (ref 13.0–17.0)
MCH: 31.6 pg (ref 26.0–34.0)
MCHC: 33.5 g/dL (ref 30.0–36.0)
MCV: 94.3 fL (ref 80.0–100.0)
Platelets: 260 K/uL (ref 150–400)
RBC: 2.82 MIL/uL — ABNORMAL LOW (ref 4.22–5.81)
RDW: 13.5 % (ref 11.5–15.5)
WBC: 6.7 K/uL (ref 4.0–10.5)
nRBC: 0 % (ref 0.0–0.2)

## 2024-03-01 LAB — LIPID PANEL
Cholesterol: 83 mg/dL (ref 0–200)
HDL: 28 mg/dL — ABNORMAL LOW (ref >40)
LDL Cholesterol: 49 mg/dL (ref 0–99)
Total CHOL/HDL Ratio: 3 ratio
Triglycerides: 30 mg/dL (ref <150)
VLDL: 6 mg/dL (ref 0–40)

## 2024-03-01 LAB — PHOSPHORUS: Phosphorus: 2.7 mg/dL (ref 2.5–4.6)

## 2024-03-01 LAB — CULTURE, BLOOD (ROUTINE X 2)
Culture: NO GROWTH
Culture: NO GROWTH

## 2024-03-01 MED ORDER — ASPIRIN 81 MG PO CHEW
81.0000 mg | CHEWABLE_TABLET | Freq: Every day | ORAL | Status: DC
  Administered 2024-03-01 – 2024-03-13 (×13): 81 mg
  Filled 2024-03-01 (×13): qty 1

## 2024-03-01 MED ORDER — MAGNESIUM SULFATE 2 GM/50ML IV SOLN
2.0000 g | Freq: Once | INTRAVENOUS | Status: AC
  Administered 2024-03-01: 2 g via INTRAVENOUS
  Filled 2024-03-01: qty 50

## 2024-03-01 NOTE — Progress Notes (Signed)
 VASCULAR LAB    ABI has been performed.  See CV proc for preliminary results.   Grant Henkes, RVT 03/01/2024, 10:03 AM

## 2024-03-01 NOTE — Progress Notes (Signed)
 PROGRESS NOTE  Ernest Campbell FMW:[id number] DOB: [birth year]   PCP: Pcp, No  Patient is from: Home.  DOA: 02/25/2024 LOS: 3  Chief complaints Chief Complaint  Patient presents with   Altered Mental Status     Brief Narrative / Interim history: 73 year old M with PMH of parkinsonism, possible Lewy body dementia, anemia, sacral and left heel decubitus presenting with altered mental status since 02/21/2024 after he started Zyprexa  by his neurologist, and admitted with acute toxic and metabolic encephalopathy.  Workup in ED significant for lactic acidosis.  CT head and MRI brain without acute finding.  Left heel x-ray raise concern for osteomyelitis.  Neurology consulted.  The next day, MRI of left heel informed osteomyelitis.  Orthopedic surgery consulted but family declined surgery and chose nonoperative treatment.  Started on broad-spectrum antibiotics.  ID and palliative medicine consulted.  Cortrack placed on 7/25 and started on TF.  Subjective: Seen and examined earlier this morning.  No major events overnight or this morning.  Awake and alert.  Mumbles some words.  Does not follow command.  Does not appear to be in distress.  Objective: Vitals:   02/29/24 2011 03/01/24 0025 03/01/24 0410 03/01/24 0749  BP: (!) 120/55 (!) 110/58 111/62 (!) 109/55  Pulse: 72 77 79 81  Resp: 20 19 19 17   Temp: 99.7 F (37.6 C) 100 F (37.8 C) 100 F (37.8 C) 98.5 F (36.9 C)  TempSrc: Oral Axillary Axillary Oral  SpO2: 99% 99% 99% 96%  Weight:      Height:        Examination:  GENERAL: Very frail. HEENT: MMM.  Vision and hearing seems to be intact.  Feeding tube in place. NECK: Supple.  No apparent JVD.  RESP:  No IWOB.  Fair aeration bilaterally. CVS:  RRR. Heart sounds normal.  ABD/GI/GU: BS+. Abd soft, NTND.  MSK/EXT: Left Muscle mass and subcu fat loss.  Left heel ulcer. SKIN: Deep sacral decubitus and left heel ulcer.  See picture under media NEURO: Awake.  Does not interact.   No facial asymmetry.  PERRL PSYCH: Calm.  No distress or agitation.  Consultants:  Neurology Orthopedic surgery Infectious disease Palliative medicine  Procedures: None  Microbiology summarized: COVID-19, influenza and RSV PCR nonreactive Blood cultures NGTD  Assessment and plan: Acute toxic and metabolic encephalopathy: Patient with possible underlying Lewy body dementia.  Temporal association of mental status change with recent initiation of Zyprexa .  Also seems to be on Xanax  at home.  CT head and MRI brain without acute finding.  EEG with moderate diffuse encephalopathy but no seizure or epileptiform discharge. UA does not suggest UTI.   Ammonia, B12, RPR, VBG and TSH unrevealing. -Appreciate input by neurology-signed off -Completed trial of high-dose thiamine  on 7/27. -Cortrack placed and tube feed started on 7/25.  Tolerating TF at goal rate. 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  CRP elevated to 14.4.  ESR 39. Patient's wife refused amputation but requested ABI which showed mild disease. -Continue ceftriaxone  and daptomycin  -Appreciate input by ID  Mild bilateral lower extremity PAD - Low-dose aspirin  per tube - Check lipid panel and initiate statin  Acute urinary retention: Patient's wife reports history of enlarged prostate.  No prior abdominal imaging.  He had multiple I&O caths.  Initially, patient's wife refused indwelling Foley catheter but she later agrees after our discussion - Started Cardura  1 mg  daily per tube.  Cannot do Flomax since NPO. - Continue Foley catheter 7/26>>  Goal of care discussion: Extensive goals of care discussion with patient's wife on 7/23 and 7/24.  Extensively discussed pros and cons of CPR and intubation in light of patient's condition.  I frankly recommended DNR/DNI.  Patient's wife voiced  understanding but she is not ready to make CODE STATUS change yet.  Palliative medicine on board.  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  Parkinsonism/possible Lewy body dementia:  - Appreciate input by neurology.  Outpatient follow-up  Hypokalemia/hypomagnesemia -Monitor replenish K and Mg as appropriate  Hyponatremia: Resolved.  Severe malnutrition/failure to thrive Body mass index is 18.14 kg/m. Nutrition Problem: Severe Malnutrition Etiology: social / environmental circumstances (frequent hospitalizations and SNF stay) Signs/Symptoms: severe muscle depletion, severe fat depletion, percent weight loss (14% x 6 months) Percent weight loss: 14 % Interventions: Juven, MVI, Prostat, Tube feeding  DVT prophylaxis:  enoxaparin  (LOVENOX ) injection 40 mg Start: 02/27/24 2200 SCDs Start: 02/25/24 2358  Code Status: Full code-confirmed with patient's wife Family Communication: None at bedside. Level of care: Med-Surg Status is: Inpatient The patient will remain inpatient because: Acute encephalopathy, left heel osteomyelitis and failure to thrive   Final disposition: Home with home health.  Patient's wife not interested in SNF   55 minutes with more than 50% spent in reviewing records, counseling patient/family and coordinating care.   Sch Meds:  Scheduled Meds:  Chlorhexidine  Gluconate Cloth  6 each Topical Daily   collagenase    Topical Daily   doxazosin   1 mg Per Tube Daily   enoxaparin  (LOVENOX ) injection  40 mg Subcutaneous QHS   feeding supplement (PROSource TF20)  60 mL Per Tube Daily   free water   100 mL Per Tube Q4H   nutrition supplement (JUVEN)  1 packet Per Tube BID BM   mouth rinse  15 mL Mouth Rinse 4 times per day   thiamine   200 mg Oral Daily   Continuous Infusions:  cefTRIAXone  (ROCEPHIN )  IV Stopped (02/29/24 1409)   DAPTOmycin  Stopped (02/29/24 1453)   feeding supplement (OSMOLITE 1.5 CAL) 40 mL/hr at 02/29/24  1709   PRN Meds:.acetaminophen  **OR** acetaminophen , albuterol , ondansetron  **OR** ondansetron  (ZOFRAN ) IV, mouth rinse, polyethylene glycol  Antimicrobials: Anti-infectives (From admission, onward)    Start     Dose/Rate Route Frequency Ordered Stop   02/27/24 1400  cefTRIAXone  (ROCEPHIN ) 2 g in sodium chloride  0.9 % 100 mL IVPB        2 g 200 mL/hr over 30 Minutes Intravenous Every 24 hours 02/27/24 1247     02/27/24 1400  DAPTOmycin  (CUBICIN ) IVPB 500 mg/8mL premix        500 mg 100 mL/hr over 30 Minutes Intravenous Daily 02/27/24 1247          I have personally reviewed the following labs and images: CBC: Recent Labs  Lab 02/25/24 1434 02/25/24 1654 02/26/24 0612 02/27/24 0307 02/28/24 0531 02/29/24 0315 03/01/24 0335  WBC 10.6*  --  8.8 10.0 10.1 7.9 6.7  NEUTROABS 7.8*  --   --   --   --   --   --   HGB 13.0   < > 11.4* 10.9* 11.1* 9.9* 8.9*  HCT 40.2   < > 34.3* 33.2* 33.8* 30.1* 26.6*  MCV 96.4  --  96.3 97.1 96.3 95.0 94.3  PLT 295  --  253 250 204 261 260   < > = values  in this interval not displayed.   BMP &GFR Recent Labs  Lab 02/26/24 0612 02/27/24 0307 02/28/24 0531 02/29/24 0315 03/01/24 0335  NA 142 144 146* 142 138  K 3.9 4.0 3.8 3.0* 4.0  CL 109 114* 114* 109 107  CO2 23 21* 21* 22 22  GLUCOSE 106* 102* 117* 150* 153*  BUN 35* 32* 23 24* 25*  CREATININE 0.88 0.87 0.89 0.72 0.70  CALCIUM  9.1 8.9 8.8* 8.5* 8.1*  MG  --  2.0 2.1 1.7 1.6*  PHOS  --  3.2 2.5 2.8 2.7   Estimated Creatinine Clearance: 60 mL/min (by C-G formula based on SCr of 0.7 mg/dL). Liver & Pancreas: Recent Labs  Lab 02/25/24 1434 02/27/24 0307 02/28/24 0531  AST 50*  --   --   ALT 19  --   --   ALKPHOS 79  --   --   BILITOT 1.2  --   --   PROT 6.7  --   --   ALBUMIN 3.3* 2.6* 2.4*   No results for input(s): LIPASE, AMYLASE in the last 168 hours. Recent Labs  Lab 02/25/24 1650  AMMONIA <13   Diabetic: No results for input(s): HGBA1C in the last 72  hours. Recent Labs  Lab 02/29/24 1545 02/29/24 2016 03/01/24 0024 03/01/24 0414 03/01/24 0751  GLUCAP 130* 157* 149* 148* 160*   Cardiac Enzymes: Recent Labs  Lab 02/28/24 0531  CKTOTAL 264   No results for input(s): PROBNP in the last 8760 hours. Coagulation Profile: No results for input(s): INR, PROTIME in the last 168 hours. Thyroid Function Tests: No results for input(s): TSH, T4TOTAL, FREET4, T3FREE, THYROIDAB in the last 72 hours.  Lipid Profile: No results for input(s): CHOL, HDL, LDLCALC, TRIG, CHOLHDL, LDLDIRECT in the last 72 hours. Anemia Panel: No results for input(s): VITAMINB12, FOLATE, FERRITIN, TIBC, IRON, RETICCTPCT in the last 72 hours.  Urine analysis:    Component Value Date/Time   COLORURINE YELLOW 02/25/2024 1703   APPEARANCEUR CLEAR 02/25/2024 1703   LABSPEC 1.024 02/25/2024 1703   PHURINE 5.0 02/25/2024 1703   GLUCOSEU NEGATIVE 02/25/2024 1703   HGBUR NEGATIVE 02/25/2024 1703   BILIRUBINUR NEGATIVE 02/25/2024 1703   KETONESUR NEGATIVE 02/25/2024 1703   PROTEINUR NEGATIVE 02/25/2024 1703   NITRITE NEGATIVE 02/25/2024 1703   LEUKOCYTESUR NEGATIVE 02/25/2024 1703   Sepsis Labs: Invalid input(s): PROCALCITONIN, LACTICIDVEN  Microbiology: Recent Results (from the past 240 hours)  Resp panel by RT-PCR (RSV, Flu A&B, Covid) Anterior Nasal Swab     Status: None   Collection Time: 02/25/24  3:49 PM   Specimen: Anterior Nasal Swab  Result Value Ref Range Status   SARS Coronavirus 2 by RT PCR NEGATIVE NEGATIVE Final   Influenza A by PCR NEGATIVE NEGATIVE Final   Influenza B by PCR NEGATIVE NEGATIVE Final    Comment: (NOTE) The Xpert Xpress SARS-CoV-2/FLU/RSV plus assay is intended as an aid in the diagnosis of influenza from Nasopharyngeal swab specimens and should not be used as a sole basis for treatment. Nasal washings and aspirates are unacceptable for Xpert Xpress  SARS-CoV-2/FLU/RSV testing.  Fact Sheet for Patients: BloggerCourse.com  Fact Sheet for Healthcare Providers: SeriousBroker.it  This test is not yet approved or cleared by the [place]  FDA and has been authorized for detection and/or diagnosis of SARS-CoV-2 by FDA under an Emergency Use Authorization (EUA). This EUA will remain in effect (meaning this test can be used) for the duration of the COVID-19 declaration under Section 564(b)(1) of the Act,  21 U.S.C. section 360bbb-3(b)(1), unless the authorization is terminated or revoked.     Resp Syncytial Virus by PCR NEGATIVE NEGATIVE Final    Comment: (NOTE) Fact Sheet for Patients: BloggerCourse.com  Fact Sheet for Healthcare Providers: SeriousBroker.it  This test is not yet approved or cleared by the [place]  FDA and has been authorized for detection and/or diagnosis of SARS-CoV-2 by FDA under an Emergency Use Authorization (EUA). This EUA will remain in effect (meaning this test can be used) for the duration of the COVID-19 declaration under Section 564(b)(1) of the Act, 21 U.S.C. section 360bbb-3(b)(1), unless the authorization is terminated or revoked.  Performed at [facility], [address] [place], [place] [address]   Blood culture (routine x 2)     Status: None   Collection Time: 02/25/24  5:00 PM   Specimen: BLOOD RIGHT HAND  Result Value Ref Range Status   Specimen Description BLOOD RIGHT HAND  Final   Special Requests   Final    BOTTLES DRAWN AEROBIC ONLY Blood Culture results may not be optimal due to an inadequate volume of blood received in culture bottles   Culture   Final    NO GROWTH 5 DAYS Performed at [facility], [address], [place], [place] [address]    Report Status 03/01/2024 FINAL  Final  Blood culture (routine x 2)     Status: None   Collection Time: 02/25/24  5:10 PM    Specimen: BLOOD LEFT HAND  Result Value Ref Range Status   Specimen Description BLOOD LEFT HAND  Final   Special Requests   Final    BOTTLES DRAWN AEROBIC ONLY Blood Culture results may not be optimal due to an inadequate volume of blood received in culture bottles   Culture   Final    NO GROWTH 5 DAYS Performed at [facility], [address] [place], [place] [address]    Report Status 03/01/2024 FINAL  Final    Radiology Studies: VAS US  ABI WITH/WO TBI Result Date: 03/01/2024  LOWER EXTREMITY DOPPLER STUDY Patient Name:  Ernest Campbell  Date of Exam:   03/01/2024 Medical Rec #: [id number]         Accession #:    [id number] Date of Birth: [birth year]          Patient Gender: M Patient Age:   31 years Exam Location:  [facility] Procedure:      VAS US  ABI WITH/WO TBI Referring Phys: MIGNON Oseas Detty --------------------------------------------------------------------------------  Indications: Ulceration, left calcaneal osteomyelitis High Risk Factors: Prior CVA.  Comparison Study: No prior study on file Performing Technologist: Rachel Pellet RVS  Examination Guidelines: A complete evaluation includes at minimum, Doppler waveform signals and systolic blood pressure reading at the level of bilateral brachial, anterior tibial, and posterior tibial arteries, when vessel segments are accessible. Bilateral testing is considered an integral part of a complete examination. Photoelectric Plethysmograph (PPG) waveforms and toe systolic pressure readings are included as required and additional duplex testing as needed. Limited examinations for reoccurring indications may be performed as noted.  ABI Findings: +---------+------------------+-----+--------+--------+ Right    Rt Pressure (mmHg)IndexWaveformComment  +---------+------------------+-----+--------+--------+ Brachial 142                    biphasic         +---------+------------------+-----+--------+--------+ PTA      115                0.81 biphasic         +---------+------------------+-----+--------+--------+  DP       114               0.80 biphasic         +---------+------------------+-----+--------+--------+ Great Toe58                0.41                  +---------+------------------+-----+--------+--------+ +---------+------------------+-----+-----------+-------+ Left     Lt Pressure (mmHg)IndexWaveform   Comment +---------+------------------+-----+-----------+-------+ Brachial 135                    biphasic           +---------+------------------+-----+-----------+-------+ PTA      118               0.83 multiphasic        +---------+------------------+-----+-----------+-------+ DP       113               0.80 multiphasic        +---------+------------------+-----+-----------+-------+ Great Toe54                0.38                    +---------+------------------+-----+-----------+-------+ +-------+-----------+-----------+------------+------------+ ABI/TBIToday's ABIToday's TBIPrevious ABIPrevious TBI +-------+-----------+-----------+------------+------------+ Right  0.81       0.41                                +-------+-----------+-----------+------------+------------+ Left   0.83       0.38                                +-------+-----------+-----------+------------+------------+  Summary: Right: Resting right ankle-brachial index indicates mild right lower extremity arterial disease. The right toe-brachial index is abnormal. Left: Resting left ankle-brachial index indicates mild left lower extremity arterial disease. The left toe-brachial index is abnormal. *See table(s) above for measurements and observations.     Preliminary        Mayan Kloepfer T. Angela Platner [facility]  If 7PM-7AM, please contact night-coverage [email or web address] 03/01/2024, 10:46 AM

## 2024-03-01 NOTE — Plan of Care (Signed)
  Problem: Clinical Measurements: Goal: Respiratory complications will improve Outcome: Progressing Goal: Cardiovascular complication will be avoided Outcome: Progressing   Problem: Coping: Goal: Level of anxiety will decrease Outcome: Progressing   Problem: Elimination: Goal: Will not experience complications related to bowel motility Outcome: Progressing   Problem: Safety: Goal: Ability to remain free from injury will improve Outcome: Progressing

## 2024-03-02 ENCOUNTER — Encounter (HOSPITAL_COMMUNITY)

## 2024-03-02 DIAGNOSIS — R509 Fever, unspecified: Secondary | ICD-10-CM

## 2024-03-02 DIAGNOSIS — Z515 Encounter for palliative care: Secondary | ICD-10-CM

## 2024-03-02 DIAGNOSIS — M869 Osteomyelitis, unspecified: Secondary | ICD-10-CM

## 2024-03-02 DIAGNOSIS — G9341 Metabolic encephalopathy: Secondary | ICD-10-CM

## 2024-03-02 DIAGNOSIS — F0280 Dementia in other diseases classified elsewhere without behavioral disturbance: Secondary | ICD-10-CM

## 2024-03-02 DIAGNOSIS — Z66 Do not resuscitate: Secondary | ICD-10-CM

## 2024-03-02 DIAGNOSIS — G3183 Dementia with Lewy bodies: Secondary | ICD-10-CM

## 2024-03-02 LAB — MAGNESIUM: Magnesium: 2 mg/dL (ref 1.7–2.4)

## 2024-03-02 LAB — GLUCOSE, CAPILLARY
Glucose-Capillary: 122 mg/dL — ABNORMAL HIGH (ref 70–99)
Glucose-Capillary: 145 mg/dL — ABNORMAL HIGH (ref 70–99)
Glucose-Capillary: 133 mg/dL — ABNORMAL HIGH (ref 70–99)
Glucose-Capillary: 140 mg/dL — ABNORMAL HIGH (ref 70–99)
Glucose-Capillary: 125 mg/dL — ABNORMAL HIGH (ref 70–99)
Glucose-Capillary: 139 mg/dL — ABNORMAL HIGH (ref 70–99)

## 2024-03-02 LAB — CBC
HCT: 29.2 % — ABNORMAL LOW (ref 39.0–52.0)
Hemoglobin: 9.5 g/dL — ABNORMAL LOW (ref 13.0–17.0)
MCH: 30.4 pg (ref 26.0–34.0)
MCHC: 32.5 g/dL (ref 30.0–36.0)
MCV: 93.6 fL (ref 80.0–100.0)
Platelets: 300 K/uL (ref 150–400)
RBC: 3.12 MIL/uL — ABNORMAL LOW (ref 4.22–5.81)
RDW: 13.2 % (ref 11.5–15.5)
WBC: 8.6 K/uL (ref 4.0–10.5)
nRBC: 0 % (ref 0.0–0.2)

## 2024-03-02 LAB — RESPIRATORY PANEL BY PCR

## 2024-03-02 LAB — LACTATE DEHYDROGENASE: LDH: 165 U/L (ref 98–192)

## 2024-03-02 LAB — PHOSPHORUS: Phosphorus: 3 mg/dL (ref 2.5–4.6)

## 2024-03-02 LAB — VAS US ABI WITH/WO TBI
Left ABI: 0.83
Right ABI: 0.81

## 2024-03-02 MED ORDER — AMOXICILLIN-POT CLAVULANATE 875-125 MG PO TABS
1.0000 | ORAL_TABLET | Freq: Two times a day (BID) | ORAL | Status: DC
  Administered 2024-03-02 – 2024-03-13 (×23): 1
  Filled 2024-03-02 (×23): qty 1

## 2024-03-02 MED ORDER — DOXYCYCLINE HYCLATE 100 MG PO TABS
100.0000 mg | ORAL_TABLET | Freq: Two times a day (BID) | ORAL | Status: DC
  Administered 2024-03-02 – 2024-03-13 (×23): 100 mg
  Filled 2024-03-02 (×23): qty 1

## 2024-03-02 NOTE — Progress Notes (Signed)
 Nutrition Follow-up  DOCUMENTATION CODES:  Severe malnutrition in context of social or environmental circumstances, Underweight  INTERVENTION:  Continue tube feeding via cortrak: Osmolite 1.5 at 50 ml/h (1200 ml per day) Prosource TF20 60 ml 1x/d Free water : 100mL every 4 hours Provides 1880 kcal, 95 gm protein, 1514 ml free water  daily 1 packet Juven BID, each packet provides 95 calories, 2.5 grams of protein (collagen) + micronutrients to support wound healing  NUTRITION DIAGNOSIS:  Severe Malnutrition related to social / environmental circumstances (frequent hospitalizations and SNF stay) as evidenced by severe muscle depletion, severe fat depletion, percent weight loss (14% x 6 months). - remains applicable   GOAL:  Patient will meet greater than or equal to 90% of their needs - remains applicable  MONITOR:  TF tolerance, Diet advancement, Labs, Weight trends, Skin  REASON FOR ASSESSMENT:  Consult Assessment of nutrition requirement/status  ASSESSMENT:  Pt with hx of prior CVA and dementia presented to ED with AMS after taking a new medication. Per wife, pt too sleepy to eat or drink for several days  7/22 - presented to ED 7/25 - cortrak tube placed  Pt resting in bed at the time of assessment. Eyes open and focused. Wife at bedside able to provide hx from over the weekend. Discussed that TF are currently infusing at goal. Pt appears to be tolerating well. BM normal consistency and not seeming to be bloated or uncomfortable. Will continue current regimen for now. Will monitor weight for need to increase infusion rate  Admit weight: 50.3 kg   Current weight: 49.4 kg  14% weight loss noted in the last ~6 months (08/2023-02/2024 which is severe)  Nutritionally Relevant Medications: Scheduled Meds:  doxazosin   1 mg Per Tube Daily   PROSource TF20  60 mL Per Tube Daily   free water   100 mL Per Tube Q4H   JUVEN  1 packet Per Tube BID BM   thiamine   200 mg Oral Daily    Continuous Infusions:  cefTRIAXone  (ROCEPHIN )  IV Stopped (03/01/24 1423)   DAPTOmycin  Stopped (03/01/24 1347)   feeding supplement (OSMOLITE 1.5 CAL) 1,000 mL (03/01/24 1926)   PRN Meds: ondansetron , polyethylene glycol  Labs Reviewed: CBG ranges from 110-145 mg/dL over the last 24 hours  NUTRITION - FOCUSED PHYSICAL EXAM: Flowsheet Row Most Recent Value  Orbital Region Severe depletion  Upper Arm Region Severe depletion  Thoracic and Lumbar Region Moderate depletion  Buccal Region Severe depletion  Temple Region Severe depletion  Clavicle Bone Region Severe depletion  Clavicle and Acromion Bone Region Moderate depletion  Scapular Bone Region Severe depletion  Dorsal Hand Moderate depletion  Patellar Region Severe depletion  Anterior Thigh Region Severe depletion  Posterior Calf Region Severe depletion  Edema (RD Assessment) None  Hair Reviewed  Eyes Reviewed  Mouth Reviewed  Skin Reviewed  Nails Reviewed    Diet Order:   Diet Order             Diet NPO time specified  Diet effective now                   EDUCATION NEEDS:  Education needs have been addressed  Skin:  Skin Assessment: Reviewed RN Assessment Stage 4:  - sacrum (5 x 3 x 0.4 cm) Stage 1: - Left hip (4.5 x 6 cm) Unstageable: - left heel (3 x 1.5 x 0.5 cm)  Last BM:  7/24 - type 4  Height:  Ht Readings from Last 1 Encounters:  02/25/24  5' 5 (1.651 m)    Weight:  Wt Readings from Last 1 Encounters:  02/27/24 49.4 kg    Ideal Body Weight:  61.8 kg  BMI:  Body mass index is 18.14 kg/m.  Estimated Nutritional Needs:  Kcal:  1700-2000 kcal/d Protein:  80-100g/d Fluid:  1.8-2L/d    Vernell Lukes, RD, LDN, CNSC Registered Dietitian II Please reach out via secure chat

## 2024-03-02 NOTE — Progress Notes (Signed)
 [facility]  Date of Admission:  02/25/2024     Lines: Peripheral iv's  Abx: 7/22-28 dapto/ceftriaxone   ASSESSMENT: 73 year old male with past medical history of anemia, possible parkinsonism,, Lewy body dementia, admitted with altered mental status, recently started on Zyprexa  ID engaged as found to have #Fevers in the setting of left calcaneal osteomyelitis and necrotic ulcer MRI showed osteomyelitis of the calcaneus, status orthopedics noted foul-smelling odor from necrotic ulcer of left heel Ortho deemed nonsurgical candidate Family doesn't want amputation Abi mild bilateral lower ext vasculopathy  ------------ 03/02/24 id assessment Ongoing fever now into hd #6. The left heel is rather unreamarkable and no abscess present. 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  Ddx lymphoma   Recommendations:- - duplex extremities -- repeat cpk -- change abx to oral doxy/augmentin  planned 6 weeks -- ldh - Standard precautions -- discussed with primary team   Principal Problem:   Altered mental status Active Problems:   AMS (altered mental status)   Toxic metabolic encephalopathy   PAD (peripheral artery disease) (HCC)   Subacute osteomyelitis of left foot (HCC)   Acute metabolic encephalopathy   Pressure injury of skin   Allergies  Allergen Reactions   Fentanyl  And Related Anaphylaxis   Other Other (See Comments)    According to the patient, all opioids cause hallucinations    Prednisone  Other (See Comments)    Hallucinations - Allergic, per [facility]   Benadryl  [Diphenhydramine ] Other (See Comments)    hallucinations   Codeine Other (See Comments)    Hallucinations - Allergic, per [facility]   Morphine  Other (See Comments)     Hallucinations - Allergic, per [facility]   Oxycodone Other (See Comments)    Hallucinations - Allergic, per [facility]    Scheduled Meds:  amoxicillin -clavulanate  1 tablet Per Tube Q12H   aspirin   81 mg Per Tube Daily   Chlorhexidine  Gluconate Cloth  6 each Topical Daily   collagenase    Topical Daily   doxazosin   1 mg Per Tube Daily   doxycycline   100 mg Per Tube Q12H   enoxaparin  (LOVENOX ) injection  40 mg Subcutaneous QHS   feeding supplement (PROSource TF20)  60 mL Per Tube Daily   free water   100 mL Per Tube Q4H   nutrition supplement (JUVEN)  1 packet Per Tube BID BM   mouth rinse  15 mL Mouth Rinse 4 times per day   thiamine   200 mg Oral Daily   Continuous Infusions:  feeding supplement (OSMOLITE 1.5 CAL) 1,000 mL (03/02/24 1102)   PRN Meds:.acetaminophen  **OR** acetaminophen , albuterol , ondansetron  **OR** ondansetron  (ZOFRAN ) IV, mouth rinse, polyethylene glycol   SUBJECTIVE: Ongoing fever Hds not disturbed Not surgical candidate Bcx ngtd   Review of Systems: ROS All other ROS was negative, except mentioned above     OBJECTIVE: Vitals:   03/01/24 2347 03/02/24 0751 03/02/24 1202 03/02/24 1618  BP: (!) 115/50 (!) 117/55 (!) 101/49 (!) 112/57  Pulse: 88 85 89 89  Resp: (!) 22 16 17 18   Temp: 99.3 F (37.4 C) 99.5 F (37.5 C) 98.2 F (36.8 C) 99 F (37.2 C)  TempSrc: Oral Axillary Axillary Oral  SpO2: 97% 98% 95% 98%  Weight:      Height:  Body mass index is 18.14 kg/m.  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; tube feed running HEENT: Normocephalic, PER, Conj Clear, EOMI, Oropharynx clear Neck supple CV: rrr no mrg Lungs: clear to auscultation, normal respiratory effort Abd: Soft, Nontender Ext/skin/msk: left heel pressure ulcer small; without fluctuance/purulence Neuro: eyes open; not following command; rigidity in extremities   Lab Results Lab Results  Component Value Date   WBC 8.6  03/02/2024   HGB 9.5 (L) 03/02/2024   HCT 29.2 (L) 03/02/2024   MCV 93.6 03/02/2024   PLT 300 03/02/2024    Lab Results  Component Value Date   CREATININE 0.70 03/01/2024   BUN 25 (H) 03/01/2024   NA 138 03/01/2024   K 4.0 03/01/2024   CL 107 03/01/2024   CO2 22 03/01/2024    Lab Results  Component Value Date   ALT 19 02/25/2024   AST 50 (H) 02/25/2024   ALKPHOS 79 02/25/2024   BILITOT 1.2 02/25/2024      Microbiology: Recent Results (from the past 240 hours)  Resp panel by RT-PCR (RSV, Flu A&B, Covid) Anterior Nasal Swab     Status: None   Collection Time: 02/25/24  3:49 PM   Specimen: Anterior Nasal Swab  Result Value Ref Range Status   SARS Coronavirus 2 by RT PCR NEGATIVE NEGATIVE Final   Influenza A by PCR NEGATIVE NEGATIVE Final   Influenza B by PCR NEGATIVE NEGATIVE Final    Comment: (NOTE) The Xpert Xpress SARS-CoV-2/FLU/RSV plus assay is intended as an aid in the diagnosis of influenza from Nasopharyngeal swab specimens and should not be used as a sole basis for treatment. Nasal washings and aspirates are unacceptable for Xpert Xpress SARS-CoV-2/FLU/RSV testing.  Fact Sheet for Patients: BloggerCourse.com  Fact Sheet for Healthcare Providers: SeriousBroker.it  This test is not yet approved or cleared by the [place]  FDA and has been authorized for detection and/or diagnosis of SARS-CoV-2 by FDA under an Emergency Use Authorization (EUA). This EUA will remain in effect (meaning this test can be used) for the duration of the COVID-19 declaration under Section 564(b)(1) of the Act, 21 U.S.C. section 360bbb-3(b)(1), unless the authorization is terminated or revoked.     Resp Syncytial Virus by PCR NEGATIVE NEGATIVE Final    Comment: (NOTE) Fact Sheet for Patients: BloggerCourse.com  Fact Sheet for Healthcare Providers: SeriousBroker.it  This test  is not yet approved or cleared by the [place]  FDA and has been authorized for detection and/or diagnosis of SARS-CoV-2 by FDA under an Emergency Use Authorization (EUA). This EUA will remain in effect (meaning this test can be used) for the duration of the COVID-19 declaration under Section 564(b)(1) of the Act, 21 U.S.C. section 360bbb-3(b)(1), unless the authorization is terminated or revoked.  Performed at [facility], [address], [place], [place] [address]   Blood culture (routine x 2)     Status: None   Collection Time: 02/25/24  5:00 PM   Specimen: BLOOD RIGHT HAND  Result Value Ref Range Status   Specimen Description BLOOD RIGHT HAND  Final   Special Requests   Final    BOTTLES DRAWN AEROBIC ONLY Blood Culture results may not be optimal due to an inadequate volume of blood received in culture bottles   Culture   Final    NO GROWTH 5 DAYS Performed at [facility], [address] [place], [place] [address]    Report Status 03/01/2024 FINAL  Final  Blood culture (  routine x 2)     Status: None   Collection Time: 02/25/24  5:10 PM   Specimen: BLOOD LEFT HAND  Result Value Ref Range Status   Specimen Description BLOOD LEFT HAND  Final   Special Requests   Final    BOTTLES DRAWN AEROBIC ONLY Blood Culture results may not be optimal due to an inadequate volume of blood received in culture bottles   Culture   Final    NO GROWTH 5 DAYS Performed at [facility], [address] [place], [place] [address]    Report Status 03/01/2024 FINAL  Final  Respiratory (~20 pathogens) panel by PCR     Status: None   Collection Time: 03/02/24 10:39 AM   Specimen: Nasopharyngeal Swab; Respiratory  Result Value Ref Range Status   Adenovirus NOT DETECTED NOT DETECTED Final   Coronavirus 229E NOT DETECTED NOT DETECTED Final    Comment: (NOTE) The Coronavirus on the Respiratory Panel, DOES NOT test for the novel  Coronavirus (2019 nCoV)    Coronavirus HKU1 NOT  DETECTED NOT DETECTED Final   Coronavirus NL63 NOT DETECTED NOT DETECTED Final   Coronavirus OC43 NOT DETECTED NOT DETECTED Final   Metapneumovirus NOT DETECTED NOT DETECTED Final   Rhinovirus / Enterovirus NOT DETECTED NOT DETECTED Final   Influenza A NOT DETECTED NOT DETECTED Final   Influenza B NOT DETECTED NOT DETECTED Final   Parainfluenza Virus 1 NOT DETECTED NOT DETECTED Final   Parainfluenza Virus 2 NOT DETECTED NOT DETECTED Final   Parainfluenza Virus 3 NOT DETECTED NOT DETECTED Final   Parainfluenza Virus 4 NOT DETECTED NOT DETECTED Final   Respiratory Syncytial Virus NOT DETECTED NOT DETECTED Final   Bordetella pertussis NOT DETECTED NOT DETECTED Final   Bordetella Parapertussis NOT DETECTED NOT DETECTED Final   Chlamydophila pneumoniae NOT DETECTED NOT DETECTED Final   Mycoplasma pneumoniae NOT DETECTED NOT DETECTED Final    Comment: Performed at [facility], [address], [place], [place] [address]     Serology:   Imaging: If present, new imagings (plain films, ct scans, and mri) have been personally visualized and interpreted; radiology reports have been reviewed. Decision making incorporated into the Impression / Recommendations.  7/26 chest ct 1. Airway thickening is present especially in both lower lobes with some degree of airway plugging more notable in the left lower lobe, and bandlike regions of volume loss and mild airspace opacity in both lower lobes favoring atelectasis although aspiration pneumonitis is included in the differential diagnosis. 2. Mitral valve calcification. 3. Left greater than right degenerative glenohumeral arthropathy. 4.  Aortic Atherosclerosis    7/23 mri left heel FINDINGS: Bones/Joint/Cartilage   Abnormal edema and enhancement in the posteroinferior calcaneus as on image 15 series 1043 compatible with osteomyelitis.   Ligaments   Suboptimal characterization due to imaging plane choices. Grossly intact.   Muscles  and Tendons   Trace nonspecific edema the quadratus plantae muscle potentially from mild myositis. Mild distal Achilles tendinopathy.   Soft tissues   Deep ulceration along the posteroinferior heel extending to the periosteal margin of the posteroinferior calcaneus on image 14 series 1043.   IMPRESSION: 1. 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 2. Trace nonspecific edema in the quadratus plantae muscle potentially from mild myositis. 3. Mild distal Achilles tendinopathy.    7/27 abi Right: Resting right ankle-brachial index indicates mild right lower  extremity arterial  disease. The right toe-brachial index is abnormal.   Left: Resting left ankle-brachial index indicates mild left lower  extremity arterial disease. The left toe-brachial index is abnormal.    Constance ONEIDA Passer, MD [facility] [phone] pager    03/02/2024, 6:47 PM

## 2024-03-02 NOTE — Progress Notes (Signed)
 PROGRESS NOTE  Ernest Campbell FMW:[id number] DOB: 06/18/1951   PCP: Pcp, No  Patient is from: Home.  DOA: 02/25/2024 LOS: 4  Chief complaints Chief Complaint  Patient presents with   Altered Mental Status     Brief Narrative / Interim history: 73 year old M with PMH of parkinsonism, possible Lewy body dementia, anemia, sacral and left heel decubitus presenting with altered mental status since 02/21/2024 after he started Zyprexa  by his neurologist, and admitted with acute toxic and metabolic encephalopathy.  Workup in ED significant for lactic acidosis.  CT head and MRI brain without acute finding.  Left heel x-ray raise concern for osteomyelitis.  Neurology consulted.  The next day, MRI of left heel informed osteomyelitis.  Orthopedic surgery consulted but family declined surgery and chose nonoperative treatment.  Started on broad-spectrum antibiotics.  ID and palliative medicine consulted.  Cortrack placed on 7/25 and started on TF.  Patient remains somnolent  Subjective: Seen and examined earlier this morning.  No major events overnight or this morning.  Sleepy but wakes to voice.  Does not speak or follow commands.  Patient's wife and niece at bedside.  Patient's wife asking for second opinion from another orthopedic surgeon.  Had lengthy goal of care discussion.  With the help and support from niece, she has decided to change CODE STATUS to DNR.   Objective: Vitals:   03/01/24 1714 03/01/24 1941 03/01/24 2347 03/02/24 0751  BP:  (!) 104/53 (!) 115/50 (!) 117/55  Pulse:  92 88 85  Resp:  (!) 24 (!) 22 16  Temp: 99.2 F (37.3 C) 98.7 F (37.1 C) 99.3 F (37.4 C) 99.5 F (37.5 C)  TempSrc: Axillary Oral Oral Axillary  SpO2:  97% 97% 98%  Weight:      Height:        Examination:  GENERAL: Very frail. HEENT: MMM.  Vision and hearing seems to be intact.  Feeding tube in place. NECK: Supple.  No apparent JVD.  RESP:  No IWOB.  Fair aeration bilaterally. CVS:  RRR. Heart  sounds normal.  ABD/GI/GU: BS+. Abd soft, NTND.  MSK/EXT: Left Muscle mass and subcu fat loss.  Left heel ulcer. SKIN: Deep sacral decubitus and left heel ulcer.  See picture under media NEURO: Sleepy but wakes to voice.  Does not talk or follow commands.  No facial asymmetry.  PERRL PSYCH: Calm.  No distress or agitation.  Consultants:  Neurology Orthopedic surgery Infectious disease Palliative medicine  Procedures: None  Microbiology summarized: COVID-19, influenza and RSV PCR nonreactive Blood cultures NGTD  Assessment and plan: Acute toxic and metabolic encephalopathy: Patient with possible underlying Lewy body dementia.  Temporal association of mental status change with recent initiation of Zyprexa .  Also seems to be on Xanax  at home.  CT head and MRI brain without acute finding.  EEG with moderate diffuse encephalopathy but no seizure or epileptiform discharge. UA does not suggest UTI.   Ammonia, B12, RPR, VBG and TSH unrevealing. -Appreciate input by neurology-signed off -Completed trial of high-dose thiamine  on 7/27. -Cortrack placed and tube feed started on 7/25.  Tolerating TF at goal rate. 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  CRP elevated to 14.4.  ESR 39. Patient's wife refused amputation but requested ABI which showed mild disease.  -Wife requesting second opinion.  I have notified orthopedic surgery PA by secure chat -Continue ceftriaxone  and daptomycin  -Appreciate  input by ID  Acute urinary retention: Patient's wife reports history of enlarged prostate.  No prior abdominal imaging.  He had multiple I&O caths.  Initially, patient's wife refused indwelling Foley catheter but she later agrees after our discussion - Started Cardura  1 mg daily per tube on 7/27.  Cannot do Flomax since NPO. - Continue Foley catheter  7/26>>  Goal of care discussion: Had lengthy goal of care discussion.  Wife understand that his quality of life is poor.  She also has not noticed progress in his mental status.  With the help and support from niece, she has decided to change CODE STATUS to DNR.  - Palliative medicine following.  Normocytic anemia: H&H seems to be at baseline.  Initial Hgb of 12-13 likely hemoconcentration.  No overt bleeding. 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  Parkinsonism/possible Lewy body dementia:  - Appreciate input by neurology.  Outpatient follow-up  Mild bilateral lower extremity PAD: LDL 49. - Low-dose aspirin  per tube  Hypokalemia/hypomagnesemia -Monitor replenish K and Mg as appropriate  Hyponatremia: Resolved.  Severe malnutrition/failure to thrive Body mass index is 18.14 kg/m. Nutrition Problem: Severe Malnutrition Etiology: social / environmental circumstances (frequent hospitalizations and SNF stay) Signs/Symptoms: severe muscle depletion, severe fat depletion, percent weight loss (14% x 6 months) Percent weight loss: 14 % Interventions: Juven, MVI, Prostat, Tube feeding  DVT prophylaxis:  enoxaparin  (LOVENOX ) injection 40 mg Start: 02/27/24 2200 SCDs Start: 02/25/24 2358  Code Status: DNR-Limited Family Communication: Updated patient's wife and niece at bedside. Level of care: Med-Surg Status is: Inpatient The patient will remain inpatient because: Acute encephalopathy, left heel osteomyelitis and failure to thrive   Final disposition: Home with home health.  Patient's wife not interested in SNF   55 minutes with more than 50% spent in reviewing records, counseling patient/family  and coordinating care.   Sch Meds:  Scheduled Meds:  aspirin   81 mg Per Tube Daily   Chlorhexidine  Gluconate Cloth  6 each Topical Daily   collagenase    Topical Daily   doxazosin   1 mg Per Tube Daily   enoxaparin  (LOVENOX ) injection  40 mg Subcutaneous QHS   feeding supplement (PROSource TF20)  60 mL Per Tube Daily   free water   100 mL Per Tube Q4H   nutrition supplement (JUVEN)  1 packet Per Tube BID BM   mouth rinse  15 mL Mouth Rinse 4 times per day   thiamine   200 mg Oral Daily   Continuous Infusions:  cefTRIAXone  (ROCEPHIN )  IV Stopped (03/01/24 1423)   DAPTOmycin  Stopped (03/01/24 1347)   feeding supplement (OSMOLITE 1.5 CAL) 1,000 mL (03/01/24 1926)   PRN Meds:.acetaminophen  **OR** acetaminophen , albuterol , ondansetron  **OR** ondansetron  (ZOFRAN ) IV, mouth rinse, polyethylene glycol  Antimicrobials: Anti-infectives (From admission, onward)    Start     Dose/Rate Route Frequency Ordered Stop   02/27/24 1400  cefTRIAXone  (ROCEPHIN ) 2 g in sodium chloride  0.9 % 100 mL IVPB        2 g 200 mL/hr over 30 Minutes Intravenous Every 24 hours 02/27/24 1247     02/27/24 1400  DAPTOmycin  (CUBICIN ) IVPB 500 mg/12mL premix        500 mg 100 mL/hr over 30 Minutes Intravenous  Daily 02/27/24 1247          I have personally reviewed the following labs and images: CBC: Recent Labs  Lab 02/25/24 1434 02/25/24 1654 02/27/24 0307 02/28/24 0531 02/29/24 0315 03/01/24 0335 03/02/24 0459  WBC 10.6*   < > 10.0 10.1 7.9 6.7 8.6  NEUTROABS 7.8*  --   --   --   --   --   --   HGB 13.0   < > 10.9* 11.1* 9.9* 8.9* 9.5*  HCT 40.2   < > 33.2* 33.8* 30.1* 26.6* 29.2*  MCV 96.4   < > 97.1 96.3 95.0 94.3 93.6  PLT 295   < > 250 204 261 260 300   < > = values in this interval not displayed.   BMP &GFR Recent Labs  Lab 02/26/24 0612 02/27/24 0307 02/28/24 0531 02/29/24 0315 03/01/24 0335 03/02/24 0459  NA 142 144 146* 142 138  --   K 3.9 4.0 3.8 3.0* 4.0  --   CL 109 114* 114*  109 107  --   CO2 23 21* 21* 22 22  --   GLUCOSE 106* 102* 117* 150* 153*  --   BUN 35* 32* 23 24* 25*  --   CREATININE 0.88 0.87 0.89 0.72 0.70  --   CALCIUM  9.1 8.9 8.8* 8.5* 8.1*  --   MG  --  2.0 2.1 1.7 1.6* 2.0  PHOS  --  3.2 2.5 2.8 2.7 3.0   Estimated Creatinine Clearance: 60 mL/min (by C-G formula based on SCr of 0.7 mg/dL). Liver & Pancreas: Recent Labs  Lab 02/25/24 1434 02/27/24 0307 02/28/24 0531  AST 50*  --   --   ALT 19  --   --   ALKPHOS 79  --   --   BILITOT 1.2  --   --   PROT 6.7  --   --   ALBUMIN 3.3* 2.6* 2.4*   No results for input(s): LIPASE, AMYLASE in the last 168 hours. Recent Labs  Lab 02/25/24 1650  AMMONIA <13   Diabetic: No results for input(s): HGBA1C in the last 72 hours. Recent Labs  Lab 03/01/24 1549 03/01/24 1946 03/01/24 2349 03/02/24 0358 03/02/24 0749  GLUCAP 144* 116* 135* 122* 145*   Cardiac Enzymes: Recent Labs  Lab 02/28/24 0531  CKTOTAL 264   No results for input(s): PROBNP in the last 8760 hours. Coagulation Profile: No results for input(s): INR, PROTIME in the last 168 hours. Thyroid Function Tests: No results for input(s): TSH, T4TOTAL, FREET4, T3FREE, THYROIDAB in the last 72 hours.  Lipid Profile: Recent Labs    03/01/24 1050  CHOL 83  HDL 28*  LDLCALC 49  TRIG 30  CHOLHDL 3.0   Anemia Panel: No results for input(s): VITAMINB12, FOLATE, FERRITIN, TIBC, IRON, RETICCTPCT in the last 72 hours.  Urine analysis:    Component Value Date/Time   COLORURINE YELLOW 02/25/2024 1703   APPEARANCEUR CLEAR 02/25/2024 1703   LABSPEC 1.024 02/25/2024 1703   PHURINE 5.0 02/25/2024 1703   GLUCOSEU NEGATIVE 02/25/2024 1703   HGBUR NEGATIVE 02/25/2024 1703   BILIRUBINUR NEGATIVE 02/25/2024 1703   KETONESUR NEGATIVE 02/25/2024 1703   PROTEINUR NEGATIVE 02/25/2024 1703   NITRITE NEGATIVE 02/25/2024 1703   LEUKOCYTESUR NEGATIVE 02/25/2024 1703   Sepsis Labs: Invalid input(s):  PROCALCITONIN, LACTICIDVEN  Microbiology: Recent Results (from the past 240 hours)  Resp panel by RT-PCR (RSV, Flu A&B, Covid) Anterior Nasal Swab     Status: None  Collection Time: 02/25/24  3:49 PM   Specimen: Anterior Nasal Swab  Result Value Ref Range Status   SARS Coronavirus 2 by RT PCR NEGATIVE NEGATIVE Final   Influenza A by PCR NEGATIVE NEGATIVE Final   Influenza B by PCR NEGATIVE NEGATIVE Final    Comment: (NOTE) The Xpert Xpress SARS-CoV-2/FLU/RSV plus assay is intended as an aid in the diagnosis of influenza from Nasopharyngeal swab specimens and should not be used as a sole basis for treatment. Nasal washings and aspirates are unacceptable for Xpert Xpress SARS-CoV-2/FLU/RSV testing.  Fact Sheet for Patients: BloggerCourse.com  Fact Sheet for Healthcare Providers: SeriousBroker.it  This test is not yet approved or cleared by the [place]  FDA and has been authorized for detection and/or diagnosis of SARS-CoV-2 by FDA under an Emergency Use Authorization (EUA). This EUA will remain in effect (meaning this test can be used) for the duration of the COVID-19 declaration under Section 564(b)(1) of the Act, 21 U.S.C. section 360bbb-3(b)(1), unless the authorization is terminated or revoked.     Resp Syncytial Virus by PCR NEGATIVE NEGATIVE Final    Comment: (NOTE) Fact Sheet for Patients: BloggerCourse.com  Fact Sheet for Healthcare Providers: SeriousBroker.it  This test is not yet approved or cleared by the [place]  FDA and has been authorized for detection and/or diagnosis of SARS-CoV-2 by FDA under an Emergency Use Authorization (EUA). This EUA will remain in effect (meaning this test can be used) for the duration of the COVID-19 declaration under Section 564(b)(1) of the Act, 21 U.S.C. section 360bbb-3(b)(1), unless the authorization is terminated  or revoked.  Performed at [facility], [address] [place], [place] [address]   Blood culture (routine x 2)     Status: None   Collection Time: 02/25/24  5:00 PM   Specimen: BLOOD RIGHT HAND  Result Value Ref Range Status   Specimen Description BLOOD RIGHT HAND  Final   Special Requests   Final    BOTTLES DRAWN AEROBIC ONLY Blood Culture results may not be optimal due to an inadequate volume of blood received in culture bottles   Culture   Final    NO GROWTH 5 DAYS Performed at [facility], [address], [place], [place] [address]    Report Status 03/01/2024 FINAL  Final  Blood culture (routine x 2)     Status: None   Collection Time: 02/25/24  5:10 PM   Specimen: BLOOD LEFT HAND  Result Value Ref Range Status   Specimen Description BLOOD LEFT HAND  Final   Special Requests   Final    BOTTLES DRAWN AEROBIC ONLY Blood Culture results may not be optimal due to an inadequate volume of blood received in culture bottles   Culture   Final    NO GROWTH 5 DAYS Performed at [facility], [address] [place], [place] [address]    Report Status 03/01/2024 FINAL  Final    Radiology Studies: VAS US  ABI WITH/WO TBI Result Date: 03/01/2024  LOWER EXTREMITY DOPPLER STUDY Patient Name:  MACINTYRE ALEXA  Date of Exam:   03/01/2024 Medical Rec #: [id number]         Accession #:    [id number] Date of Birth: 11-01-1950          Patient Gender: M Patient Age:   40 years Exam Location:  [facility] Procedure:      VAS US  ABI WITH/WO TBI Referring Phys: MIGNON Jilliana Burkes --------------------------------------------------------------------------------  Indications: Ulceration,  left calcaneal osteomyelitis High Risk Factors: Prior CVA.  Comparison Study: No prior study on file Performing Technologist: Rachel Pellet RVS  Examination Guidelines: A complete evaluation includes at minimum, Doppler waveform signals and systolic blood pressure reading at the level of bilateral  brachial, anterior tibial, and posterior tibial arteries, when vessel segments are accessible. Bilateral testing is considered an integral part of a complete examination. Photoelectric Plethysmograph (PPG) waveforms and toe systolic pressure readings are included as required and additional duplex testing as needed. Limited examinations for reoccurring indications may be performed as noted.  ABI Findings: +---------+------------------+-----+--------+--------+ Right    Rt Pressure (mmHg)IndexWaveformComment  +---------+------------------+-----+--------+--------+ Brachial 142                    biphasic         +---------+------------------+-----+--------+--------+ PTA      115               0.81 biphasic         +---------+------------------+-----+--------+--------+ DP       114               0.80 biphasic         +---------+------------------+-----+--------+--------+ Great Toe58                0.41                  +---------+------------------+-----+--------+--------+ +---------+------------------+-----+-----------+-------+ Left     Lt Pressure (mmHg)IndexWaveform   Comment +---------+------------------+-----+-----------+-------+ Brachial 135                    biphasic           +---------+------------------+-----+-----------+-------+ PTA      118               0.83 multiphasic        +---------+------------------+-----+-----------+-------+ DP       113               0.80 multiphasic        +---------+------------------+-----+-----------+-------+ Great Toe54                0.38                    +---------+------------------+-----+-----------+-------+ +-------+-----------+-----------+------------+------------+ ABI/TBIToday's ABIToday's TBIPrevious ABIPrevious TBI +-------+-----------+-----------+------------+------------+ Right  0.81       0.41                                +-------+-----------+-----------+------------+------------+ Left    0.83       0.38                                +-------+-----------+-----------+------------+------------+  Summary: Right: Resting right ankle-brachial index indicates mild right lower extremity arterial disease. The right toe-brachial index is abnormal. Left: Resting left ankle-brachial index indicates mild left lower extremity arterial disease. The left toe-brachial index is abnormal. *See table(s) above for measurements and observations.     Preliminary        Neera Teng T. Marshay Slates [facility]  If 7PM-7AM, please contact night-coverage [email or web address] 03/02/2024, 9:59 AM

## 2024-03-02 NOTE — Progress Notes (Signed)
 Patient ID: LE FAULCON, male   DOB: Jun 05, 1951, 73 y.o.   MRN: [id number]    Progress Note from the Palliative Medicine Team at [facility]   Patient Name: Ernest Campbell        Date: 03/02/2024 DOB: 01-14-1951  Age: 73 y.o. MRN#: [id number] Attending Physician: Kathrin Mignon DASEN, MD Primary Care Physician: Pcp, No Admit Date: 02/25/2024   Reason for Consultation/Follow-up   Establishing Goals of Care   HPI/ Brief Hospital Review  73 year old M with PMH of parkinsonism, possible Lewy body dementia, anemia, sacral and left heel decubitus presenting with altered mental status since 02/21/2024 after he started Zyprexa  by his neurologist, and admitted with acute toxic and metabolic encephalopathy.  Workup in ED significant for lactic acidosis.  CT head and MRI brain without acute finding.  Left heel x-ray raise concern for osteomyelitis.  Neurology consulted.   The next day, MRI of left heel informed osteomyelitis.  Orthopedic surgery consulted but family declined surgery and chose nonoperative treatment.  Started on broad-spectrum antibiotics.  ID and palliative medicine consulted.   Cortrack placed on 7/25 and started on TF.  Patient remains somnolent  Family face treatment option decisions, advanced directive decisions and anticipatory care needs.   Subjective  Extensive chart review has been completed prior to meeting with patient/family  including labs, vital signs, imaging, progress/consult notes, orders, medications and available advance directive documents.    This NP assessed patient at the bedside as a follow up for palliative medicine needs and emotional support.  No family at bedside.  Patient remains lethargic, unable to follow simple commands.  He is tolerating tube feed at goal rate.  I was able to speak to patient's wife by telephone today.  Ongoing education regarding current medical situation.  Patient's wife tells me she awaits second opinion from orthopedic surgeon.   Amputation is not a consideration, she is hopeful for alternative medical management.   Education offered today regarding  the importance of continued conversation with family and their  medical providers regarding overall plan of care and treatment options,  ensuring decisions are within the context of the patients values and GOCs.  Questions and concerns addressed   Discussed with primary team and nursing staff  PMT will continue to support holistically   Time: 25  minutes  Detailed review of medical records ( labs, imaging, vital signs), medically appropriate exam ( MS, skin, cardiac,  resp)   discussed with treatment team, counseling and education to patient, family, staff, documenting clinical information, medication management, coordination of care    Ronal Plants NP  Palliative Medicine Team Team Phone # [phone] Pager [phone]

## 2024-03-02 NOTE — Progress Notes (Signed)
 Speech Language Pathology Treatment: Dysphagia  Patient Details Name: Ernest Campbell MRN: [id number] DOB: 03/29/1951 Today's Date: 03/02/2024 Time: 9054-9044 SLP Time Calculation (min) (ACUTE ONLY): 10 min  Assessment / Plan / Recommendation Clinical Impression  Pt presents with improved alertness but ongoing s/s of dysphagia, which is suspected to be secondary to cognitive factors. He was more attentive and able to seal his lips around presentations of ice chips and purees from the spoon but this did not carryover to presentations via cup or straw. Although coughing was not observed today, he was unable to be challenged with larger volumes as he passively accepted siphoned straw sips or cup sips of water . Oral holding was prolonged with trials of purees and he does not consistently follow commands to initiate a swallow. Although pt does have a history of dysphagia and cognitive deficits, his current presentation continues to be altered from baseline. If pt's level of alertness continues to improve, he may be nearing readiness for POs beyond ice chips/sips of water  after oral care but for now, recommend continuing this in isolation. Will f/u.    HPI HPI: Ernest Campbell is a 73 yo male presenting to ED 7/22 with AMS since 7/18 after starting Zyprexa . Admitted with acute toxic and metabolic encephalopathy, lactic acidosis, and concern for L heel osteomyelitis. MRI Brain negative. Most recently seen by SLP April 2025 with deficits related to oral holding and intermittent dry coughing with thin liquids but was ultimately discharged on Dys 2 diet with thin liquids. Previous swallow evaluation December 2024 revealed functional performance but with concern for adequate intake in light of mentation. 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, anemia, sacral and L heel decubitus      SLP Plan  Continue with current plan of care          Recommendations  Diet recommendations:  NPO Medication Administration: Via alternative means                  Oral care QID;Oral care prior to ice chip/H20   Frequent or constant Supervision/Assistance Dysphagia, unspecified (R13.10)     Continue with current plan of care     Damien Blumenthal, M.A., CCC-SLP Speech Language Pathology, Acute Rehabilitation Services  Secure Chat preferred [phone]   03/02/2024, 10:40 AM

## 2024-03-02 NOTE — TOC Progression Note (Signed)
 Transition of Care [facility]) - Progression Note    Patient Details  Name: Ernest Campbell MRN: [id number] Date of Birth: 04-26-1951  [facility] CM/SW Contact  [name], RN Phone Number: 03/02/2024, 1:38 PM  Clinical Narrative:     Pt was active with [facility] prior to admission for Rn/Pt/Ot.  IP Care Management following for d/c needs.    Expected Discharge Plan:  (TBD)                 Expected Discharge Plan and Services                                               Social Drivers of Health (SDOH) Interventions SDOH Screenings   Food Insecurity: Patient Unable To Answer (02/27/2024)  Housing: Unknown (02/27/2024)  Transportation Needs: Patient Unable To Answer (02/27/2024)  Utilities: Patient Unable To Answer (02/27/2024)  Financial Resource Strain: Low Risk  (10/20/2022)   Received from [facility]  Physical Activity: Unknown (10/20/2022)   Received from [facility]  Social Connections: Unknown (02/27/2024)  Stress: Patient Declined (10/20/2022)   Received from [facility]  Tobacco Use: High Risk (02/25/2024)    Readmission Risk Interventions    11/08/2023    1:14 PM  Readmission Risk Prevention Plan  Transportation Screening   Medication Review (RN Care Manager)   PCP or Specialist appointment within 3-5 days of discharge   PCP/Specialist Appt Not Complete comments   HRI or Home Care Consult   SW Recovery Care/Counseling Consult   Palliative Care Screening   Skilled Nursing Facility      Information is confidential and restricted. Go to Review Flowsheets to unlock data.

## 2024-03-02 NOTE — Care Management Important Message (Signed)
 Important Message  Patient Details  Name: Ernest Campbell MRN: [id number] Date of Birth: 10-02-1950   Important Message Given:        [name] 03/02/2024, 3:47 PM

## 2024-03-02 NOTE — Consult Note (Signed)
 Patient ID: Ernest Campbell MRN: [id number] DOB/AGE: Mar 12, 1951 73 y.o.  Admit date: 02/25/2024  Admission Diagnoses:  Principal Problem:   Altered mental status Active Problems:   AMS (altered mental status)   Toxic metabolic encephalopathy   PAD (peripheral artery disease) (HCC)   Subacute osteomyelitis of left foot (HCC)   Acute metabolic encephalopathy   Pressure injury of skin   HPI: Ortho consult for second opinion on left posterior heel pressure ulcer. Pt seen previously by Dr Harden during this admission - wife is POA and refuses amputation. Today patient examined at bedside with wife, niece, niece's husband and other family in the room. Patient's wife states that amputation is 100% off the table. Reports that the heel ulcer has been present since Feb 2025. He is also dealing with sacral decubitus ulcer.  PMH notable for parkinsonism, possible Lewy body dementia, anemia, sacral and left heel decubitus presenting with altered mental status since 02/21/2024 after he started Zyprexa  by his neurologist, and admitted with acute toxic and metabolic encephalopathy.  History provided by wife/family members as pt himself is not very communicative.  Past Medical History: Past Medical History:  Diagnosis Date   Anemia    Dementia (HCC)    Stroke [facility])     Surgical History: Past Surgical History:  Procedure Laterality Date   INTRAMEDULLARY (IM) NAIL INTERTROCHANTERIC Right 09/26/2023   Procedure: INTRAMEDULLARY (IM) NAIL INTERTROCHANTERIC;  Surgeon: Kendal Franky SQUIBB, MD;  Location: [facility] OR;  Service: Orthopedics;  Laterality: Right;   ORIF FEMUR FRACTURE Right 10/24/2023   Procedure: OPEN REDUCTION INTERNAL FIXATION FEMORAL SHAFT FRACTURE;  Surgeon: Kendal Franky SQUIBB, MD;  Location: [facility] OR;  Service: Orthopedics;  Laterality: Right;    Family History: Family History  Problem Relation Age of Onset   Dementia Mother    Parkinsonism Father    Stroke Father    Breast cancer Sister      Social History: Social History   Socioeconomic History   Marital status: Married    Spouse name: Not on file   Number of children: Not on file   Years of education: Not on file   Highest education level: Not on file  Occupational History   Not on file  Tobacco Use   Smoking status: Every Day    Current packs/day: 0.50    Types: Cigarettes   Smokeless tobacco: Not on file  Substance and Sexual Activity   Alcohol use: Never   Drug use: Never   Sexual activity: Not on file  Other Topics Concern   Not on file  Social History Narrative   Lives with wife    Pt retired    Chief Executive Officer Drivers of Corporate investment banker Strain: Low Risk  (10/20/2022)   Received from [facility]   Overall Financial Resource Strain (CARDIA)    Difficulty of Paying Living Expenses: Not hard at all  Food Insecurity: Patient Unable To Answer (02/27/2024)   Hunger Vital Sign    Worried About Running Out of Food in the Last Year: Patient unable to answer    Ran Out of Food in the Last Year: Patient unable to answer  Transportation Needs: Patient Unable To Answer (02/27/2024)   PRAPARE - Transportation    Lack of Transportation (Medical): Patient unable to answer    Lack of Transportation (Non-Medical): Patient unable to answer  Physical Activity: Unknown (10/20/2022)   Received from [facility]   Exercise Vital Sign    On average, how many days  per week do you engage in moderate to strenuous exercise (like a brisk walk)?: Patient declined    On average, how many minutes do you engage in exercise at this level?: 30 min  Stress: Patient Declined (10/20/2022)   Received from [facility] of Occupational [facility] - Occupational Stress Questionnaire    Feeling of Stress : Patient declined  Social Connections: Unknown (02/27/2024)   Social Connection and Isolation Panel    Frequency of Communication with Friends and Family: Patient unable to answer    Frequency of Social Gatherings  with Friends and Family: Patient unable to answer    Attends Religious Services: Patient unable to answer    Active Member of Clubs or Organizations: Patient unable to answer    Attends Banker Meetings: Patient unable to answer    Marital Status: Married  Catering manager Violence: Patient Unable To Answer (02/27/2024)   Humiliation, Afraid, Rape, and Kick questionnaire    Fear of Current or Ex-Partner: Patient unable to answer    Emotionally Abused: Patient unable to answer    Physically Abused: Patient unable to answer    Sexually Abused: Patient unable to answer    Allergies: Fentanyl  and related, Other, Prednisone , Benadryl  [diphenhydramine ], Codeine, Morphine , and Oxycodone  Medications: I have reviewed the patient's current medications.  Vital Signs: Patient Vitals for the past 24 hrs:  BP Temp Temp src Pulse Resp SpO2  03/02/24 1202 (!) 101/49 98.2 F (36.8 C) Axillary 89 17 95 %  03/02/24 0751 (!) 117/55 99.5 F (37.5 C) Axillary 85 16 98 %  03/01/24 2347 (!) 115/50 99.3 F (37.4 C) Oral 88 (!) 22 97 %  03/01/24 1941 (!) 104/53 98.7 F (37.1 C) Oral 92 (!) 24 97 %  03/01/24 1714 -- 99.2 F (37.3 C) Axillary -- -- --  03/01/24 1550 (!) 120/54 (!) 101.1 F (38.4 C) Oral 88 20 96 %    Radiology: VAS US  ABI WITH/WO TBI Result Date: 03/01/2024  LOWER EXTREMITY DOPPLER STUDY Patient Name:  Ernest Campbell  Date of Exam:   03/01/2024 Medical Rec #: [id number]         Accession #:    [id number] Date of Birth: 05-08-1951          Patient Gender: M Patient Age:   69 years Exam Location:  [facility] Procedure:      VAS US  ABI WITH/WO TBI Referring Phys: MIGNON GONFA --------------------------------------------------------------------------------  Indications: Ulceration, left calcaneal osteomyelitis High Risk Factors: Prior CVA.  Comparison Study: No prior study on file Performing Technologist: Rachel Pellet RVS  Examination Guidelines: A complete evaluation  includes at minimum, Doppler waveform signals and systolic blood pressure reading at the level of bilateral brachial, anterior tibial, and posterior tibial arteries, when vessel segments are accessible. Bilateral testing is considered an integral part of a complete examination. Photoelectric Plethysmograph (PPG) waveforms and toe systolic pressure readings are included as required and additional duplex testing as needed. Limited examinations for reoccurring indications may be performed as noted.  ABI Findings: +---------+------------------+-----+--------+--------+ Right    Rt Pressure (mmHg)IndexWaveformComment  +---------+------------------+-----+--------+--------+ Brachial 142                    biphasic         +---------+------------------+-----+--------+--------+ PTA      115               0.81 biphasic         +---------+------------------+-----+--------+--------+ DP  114               0.80 biphasic         +---------+------------------+-----+--------+--------+ Great Toe58                0.41                  +---------+------------------+-----+--------+--------+ +---------+------------------+-----+-----------+-------+ Left     Lt Pressure (mmHg)IndexWaveform   Comment +---------+------------------+-----+-----------+-------+ Brachial 135                    biphasic           +---------+------------------+-----+-----------+-------+ PTA      118               0.83 multiphasic        +---------+------------------+-----+-----------+-------+ DP       113               0.80 multiphasic        +---------+------------------+-----+-----------+-------+ Great Toe54                0.38                    +---------+------------------+-----+-----------+-------+ +-------+-----------+-----------+------------+------------+ ABI/TBIToday's ABIToday's TBIPrevious ABIPrevious TBI +-------+-----------+-----------+------------+------------+ Right  0.81        0.41                                +-------+-----------+-----------+------------+------------+ Left   0.83       0.38                                +-------+-----------+-----------+------------+------------+  Summary: Right: Resting right ankle-brachial index indicates mild right lower extremity arterial disease. The right toe-brachial index is abnormal. Left: Resting left ankle-brachial index indicates mild left lower extremity arterial disease. The left toe-brachial index is abnormal. *See table(s) above for measurements and observations.     Preliminary    CT CHEST WO CONTRAST Result Date: 02/29/2024 CLINICAL DATA:  Pneumonia EXAM: CT CHEST WITHOUT CONTRAST TECHNIQUE: Multidetector CT imaging of the chest was performed following the standard protocol without IV contrast. RADIATION DOSE REDUCTION: This exam was performed according to the departmental dose-optimization program which includes automated exposure control, adjustment of the mA and/or kV according to patient size and/or use of iterative reconstruction technique. COMPARISON:  Radiographs 02/28/2024 FINDINGS: Cardiovascular: Coronary, aortic arch, and branch vessel atherosclerotic vascular disease. Mitral valve calcification. Mediastinum/Nodes: A feeding tube terminates in the duodenal bulb. Lungs/Pleura: Airway thickening is present especially in both lower lobes with some degree of airway plugging more notable in the left lower lobe, and bandlike regions of volume loss and mild airspace opacity in both lower lobes favoring atelectasis although aspiration pneumonitis is included in the differential diagnosis. Upper Abdomen: Unremarkable Musculoskeletal: Left greater than right degenerative glenohumeral arthropathy. IMPRESSION: 1. Airway thickening is present especially in both lower lobes with some degree of airway plugging more notable in the left lower lobe, and bandlike regions of volume loss and mild airspace opacity in both lower lobes  favoring atelectasis although aspiration pneumonitis is included in the differential diagnosis. 2. Mitral valve calcification. 3. Left greater than right degenerative glenohumeral arthropathy. 4.  Aortic Atherosclerosis (ICD10-I70.0). Electronically Signed   By: Ryan Salvage M.D.   On: 02/29/2024 12:39   DG CHEST PORT 1 VIEW Result Date: 02/28/2024 CLINICAL DATA:  Pneumonia. EXAM: PORTABLE CHEST 1 VIEW COMPARISON:  Chest radiograph dated 02/25/2024. FINDINGS: Enteric tube extends below diaphragm with tip beyond the inferior margin of the image. Minimal left lung base atelectasis or infiltrate. Background of emphysema. No pleural effusion or pneumothorax. The cardiac silhouette is within normal limits. No acute osseous pathology. IMPRESSION: Minimal left lung base atelectasis or infiltrate. Electronically Signed   By: Vanetta Chou M.D.   On: 02/28/2024 12:36   EEG adult Result Date: 02/26/2024 Shelton Arlin KIDD, MD     02/26/2024 12:30 PM Patient Name: DAREY HERSHBERGER MRN: [id number] Epilepsy Attending: Arlin KIDD Shelton Referring Physician/Provider: Donnamarie Lebron PARAS, MD Date: 02/26/2024 Duration: 23.20 mins Patient history: 73yo M with ams. EEG to evaluate for seizure Level of alertness: sleep/ lethargic AEDs during EEG study: None Technical aspects: This EEG study was done with scalp electrodes positioned according to the 10-20 International system of electrode placement. Electrical activity was reviewed with band pass filter of 1-70Hz , sensitivity of 7 uV/mm, display speed of 33mm/sec with a 60Hz  notched filter applied as appropriate. EEG data were recorded continuously and digitally stored.  Video monitoring was available and reviewed as appropriate. Description: EEG showed continuous generalized predominantly 5 to 7 Hz theta slowing admixed with intermittent 2 to 3 Hz delta slowing, at times with triphasic morphology.  Hyperventilation and photic stimulation were not performed.   ABNORMALITY -  Continuous slow, generalized IMPRESSION: This study is suggestive of moderate diffuse encephalopathy. No seizures or epileptiform discharges were seen throughout the recording. Arlin KIDD Shelton   MR HEEL LEFT W WO CONTRAST Result Date: 02/26/2024 CLINICAL DATA:  Osteomyelitis of the foot EXAM: MRI OF LOWER LEFT EXTREMITY WITHOUT AND WITH CONTRAST TECHNIQUE: Multiplanar, multisequence MR imaging of the left foot from the posterior ankle through the Lisfranc joint was performed both before and after administration of intravenous contrast. CONTRAST:  5mL GADAVIST  GADOBUTROL  1 MMOL/ML IV SOLN COMPARISON:  Radiographs 06/27/2024 FINDINGS: Bones/Joint/Cartilage Abnormal edema and enhancement in the posteroinferior calcaneus as on image 15 series 1043 compatible with osteomyelitis. Ligaments Suboptimal characterization due to imaging plane choices. Grossly intact. Muscles and Tendons Trace nonspecific edema the quadratus plantae muscle potentially from mild myositis. Mild distal Achilles tendinopathy. Soft tissues Deep ulceration along the posteroinferior heel extending to the periosteal margin of the posteroinferior calcaneus on image 14 series 1043. IMPRESSION: 1. 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 2. Trace nonspecific edema in the quadratus plantae muscle potentially from mild myositis. 3. Mild distal Achilles tendinopathy. Electronically Signed   By: Ryan Salvage M.D.   On: 02/26/2024 10:52   MR Brain W and Wo Contrast Result Date: 02/25/2024 EXAM: MRI BRAIN WITH AND WITHOUT CONTRAST 02/25/2024 11:40:00 PM TECHNIQUE: Multiplanar multisequence MRI of the head/brain was performed with and without the administration of intravenous contrast. COMPARISON: 07/18/2023 CLINICAL HISTORY: Altered mental status, nontraumatic (Ped 0-17y). FINDINGS: BRAIN AND VENTRICLES: No  acute infarct. No acute intracranial hemorrhage. No mass or abnormal enhancement. No midline shift. No hydrocephalus. The sella is unremarkable. Normal flow voids. Mild volume loss. Multifocal hyperintense T2-weighted signal within the cerebral white matter, most commonly due to chronic small vessel disease. ORBITS: No acute abnormality. SINUSES AND MASTOIDS: Right maxillary sinus mucosal thickening. No acute abnormality. BONES AND SOFT TISSUES: Normal bone marrow signal. No acute soft tissue abnormality. IMPRESSION: 1. No acute intracranial abnormality. 2. Mild volume loss and multifocal hyperintense T2-weighted signal within the cerebral  white matter, most commonly due to chronic small vessel disease. 3. Right maxillary sinus mucosal thickening. Electronically signed by: Franky Stanford MD 02/25/2024 11:50 PM EDT RP Workstation: [id number]   DG Hip Unilat W or Wo Pelvis 2-3 Views Right Result Date: 02/25/2024 CLINICAL DATA:  Altered mental status. History of prior ORIF of the right femur. EXAM: DG HIP (WITH OR WITHOUT PELVIS) 2-3V RIGHT COMPARISON:  10/24/2023. FINDINGS: Diffuse osseous demineralization. No definite acute fracture or dislocation. Postoperative changes related to prior ORIF of a right femoral shaft fracture with evidence of interval hearing. Prior fixation of a right proximal femur intertrochanteric fracture. IMPRESSION: Postoperative changes related to prior ORIF of a right femoral shaft fracture with evidence of interval hearing and prior fixation of a right proximal femur intertrochanteric fracture. No definite acute osseous abnormality. Electronically Signed   By: Harrietta Sherry M.D.   On: 02/25/2024 18:15   DG Foot Complete Left Result Date: 02/25/2024 CLINICAL DATA:  Foot ulcer. EXAM: LEFT FOOT - COMPLETE 3+ VIEW COMPARISON:  None Available. FINDINGS: Soft tissue ulceration and swelling of the heel with cortical indistinctness of the underlying calcaneal tuberosity. The remainder of the  visualized bones appear intact. No evidence of dislocation. Mild first MTP joint space narrowing. IMPRESSION: Soft tissue ulceration and swelling of the heel with cortical indistinctness of the underlying calcaneal tuberosity, concerning for osteomyelitis. Electronically Signed   By: Harrietta Sherry M.D.   On: 02/25/2024 18:10   CT Head Wo Contrast Result Date: 02/25/2024 CLINICAL DATA:  Altered mental status EXAM: CT HEAD WITHOUT CONTRAST TECHNIQUE: Contiguous axial images were obtained from the base of the skull through the vertex without intravenous contrast. RADIATION DOSE REDUCTION: This exam was performed according to the departmental dose-optimization program which includes automated exposure control, adjustment of the mA and/or kV according to patient size and/or use of iterative reconstruction technique. COMPARISON:  CT brain 11/07/2023 FINDINGS: Brain: No acute territorial infarction, hemorrhage or intracranial mass. Mild white matter hypodensity likely chronic small vessel ischemic change. Mild atrophy. Stable ventricle size. Vascular: No hyperdense vessels.  Carotid vascular calcification Skull: No fracture Sinuses/Orbits: Opacified right maxillary sinus with hyperdense secretions or possible fungal colonization as before. Other: None IMPRESSION: 1. No CT evidence for acute intracranial abnormality. 2. Mild atrophy and chronic small vessel ischemic changes of the white matter. 3. Chronic right maxillary sinus disease. Electronically Signed   By: Luke Bun M.D.   On: 02/25/2024 16:21   DG Chest Portable 1 View Result Date: 02/25/2024 CLINICAL DATA:  Altered mental status. EXAM: PORTABLE CHEST 1 VIEW COMPARISON:  Chest radiograph dated 11/07/2023. FINDINGS: No focal consolidation, pleural effusion or pneumothorax. The cardiac silhouette is within normal limits. No acute osseous pathology. IMPRESSION: No active disease. Electronically Signed   By: Vanetta Chou M.D.   On: 02/25/2024 14:43     Labs: Recent Labs    03/01/24 0335 03/02/24 0459  WBC 6.7 8.6  RBC 2.82* 3.12*  HCT 26.6* 29.2*  PLT 260 300   Recent Labs    02/29/24 0315 03/01/24 0335  NA 142 138  K 3.0* 4.0  CL 109 107  CO2 22 22  BUN 24* 25*  CREATININE 0.72 0.70  GLUCOSE 150* 153*  CALCIUM  8.5* 8.1*   No results for input(s): LABPT, INR in the last 72 hours.  Review of Systems: ROS as detailed in HPI  Physical Exam: Body mass index is 18.14 kg/m.  Physical Exam   Gen: AAOx3, NAD Comfortable at rest  Left  Lower Extremity: Skin intact except full thickness ulcer with exposed calcaneus over posterior heel Minimal erythema/no drainage DP, PT 2+ to palp CR < 2s   Assessment and Plan: Ortho consult for second opinion on left posterior heel pressure ulcer, onset Feb 2025  -history, exam and imaging reviewed at length with patient/family -as per POA wife, amputation is not to be considered at this time. Reviewed risks/benefits of below knee amputation and after a fully informed discussion on operative and nonoperative options, the patient's wife remains committed to a nonoperative treatment plan -wound care consult -Plastics consult  -continue to offload bilateral heels  Lillia Mountain, MD Orthopaedic Surgeon [facility] [phone]  The risks and benefits were presented and reviewed. The risks due to stump failure/irritation, new/persistent/recurrent infection, stiffness, nerve/vessel/tendon injury, nonunion/malunion of any fracture, wound healing issues, allograft usage, development of arthritis, failure of this surgery, possibility of external fixation in certain situations, possibility of delayed definitive surgery, need for further surgery, prolonged wound care including further soft tissue coverage procedures, thromboembolic events, anesthesia/medical complications/events perioperatively and beyond, further amputation, death among others were discussed. The patient's wife  acknowledged the explanation and elects for non operative care.

## 2024-03-03 ENCOUNTER — Inpatient Hospital Stay (HOSPITAL_COMMUNITY)

## 2024-03-03 DIAGNOSIS — R4182 Altered mental status, unspecified: Secondary | ICD-10-CM | POA: Diagnosis not present

## 2024-03-03 DIAGNOSIS — Z515 Encounter for palliative care: Secondary | ICD-10-CM | POA: Diagnosis not present

## 2024-03-03 DIAGNOSIS — M869 Osteomyelitis, unspecified: Secondary | ICD-10-CM | POA: Diagnosis not present

## 2024-03-03 DIAGNOSIS — M8610 Other acute osteomyelitis, unspecified site: Secondary | ICD-10-CM | POA: Diagnosis not present

## 2024-03-03 DIAGNOSIS — Z7189 Other specified counseling: Secondary | ICD-10-CM | POA: Diagnosis not present

## 2024-03-03 DIAGNOSIS — R509 Fever, unspecified: Secondary | ICD-10-CM

## 2024-03-03 DIAGNOSIS — A419 Sepsis, unspecified organism: Secondary | ICD-10-CM

## 2024-03-03 DIAGNOSIS — M86272 Subacute osteomyelitis, left ankle and foot: Secondary | ICD-10-CM | POA: Diagnosis not present

## 2024-03-03 DIAGNOSIS — R627 Adult failure to thrive: Secondary | ICD-10-CM | POA: Diagnosis not present

## 2024-03-03 LAB — GLUCOSE, CAPILLARY
Glucose-Capillary: 109 mg/dL — ABNORMAL HIGH (ref 70–99)
Glucose-Capillary: 116 mg/dL — ABNORMAL HIGH (ref 70–99)
Glucose-Capillary: 137 mg/dL — ABNORMAL HIGH (ref 70–99)
Glucose-Capillary: 139 mg/dL — ABNORMAL HIGH (ref 70–99)
Glucose-Capillary: 169 mg/dL — ABNORMAL HIGH (ref 70–99)

## 2024-03-03 LAB — CK: Total CK: 54 U/L (ref 49–397)

## 2024-03-03 MED ORDER — CARBIDOPA-LEVODOPA 25-100 MG PO TABS
1.0000 | ORAL_TABLET | Freq: Three times a day (TID) | ORAL | Status: DC
  Administered 2024-03-03 – 2024-03-13 (×29): 1
  Filled 2024-03-03 (×29): qty 1

## 2024-03-03 MED ORDER — IOHEXOL 350 MG/ML SOLN
75.0000 mL | Freq: Once | INTRAVENOUS | Status: AC | PRN
  Administered 2024-03-03: 75 mL via INTRAVENOUS

## 2024-03-03 MED ORDER — THIAMINE MONONITRATE 100 MG PO TABS
200.0000 mg | ORAL_TABLET | Freq: Every day | ORAL | Status: DC
  Administered 2024-03-03 – 2024-03-13 (×11): 200 mg
  Filled 2024-03-03 (×10): qty 2

## 2024-03-03 NOTE — Progress Notes (Signed)
 PROGRESS NOTE  Ernest Campbell FMW:[id number] DOB: March 20, 1951   PCP: Pcp, No  Patient is from: Home.  DOA: 02/25/2024 LOS: 5  Chief complaints Chief Complaint  Patient presents with   Altered Mental Status     Brief Narrative / Interim history: 73 year old M with PMH of parkinsonism, possible Lewy body dementia, anemia, sacral and left heel decubitus presenting with altered mental status since 02/21/2024 after he started Zyprexa  by his neurologist, and admitted with acute toxic and metabolic encephalopathy.  Workup in ED significant for lactic acidosis.  CT head and MRI brain without acute finding.  Left heel x-ray raise concern for osteomyelitis.  Neurology consulted.  The next day, MRI of left heel informed osteomyelitis.  Orthopedic surgery consulted but family declined surgery and chose nonoperative treatment.  Started on broad-spectrum antibiotics.  ID and palliative medicine consulted.  Antibiotics discolored to p.o.  Cortrack placed on 7/25 and started on TF.  Patient remains somnolent.   Subjective: Seen and examined earlier this morning.  No major events overnight or this morning.  Remains sleepy and somnolent.  Intermittently wakes but not interacting or following command.  Patient's wife and niece at bedside. Patient's wife states that Ortho has seen patient yesterday and consulted plastic surgery.   Per RN, there's no conversation about it. I said yesterday I would think about it, but did not agree. I'm done talking. he is not a dnr.   Objective: Vitals:   03/02/24 1933 03/02/24 2326 03/03/24 0735 03/03/24 1152  BP: (!) 101/51 (!) 111/59 114/60 (!) 103/52  Pulse: 89 85 79 86  Resp: 18 18 18 18   Temp: (!) 100.5 F (38.1 C) 100.3 F (37.9 C) 99.7 F (37.6 C) 98 F (36.7 C)  TempSrc: Oral Oral Oral Oral  SpO2: 97% 99% 98% 99%  Weight:      Height:        Examination:  GENERAL: Very frail. HEENT: MMM.  Vision and hearing seems to be intact.  Feeding tube in  place. NECK: Supple.  No apparent JVD.  RESP:  No IWOB.  Fair aeration bilaterally. CVS:  RRR. Heart sounds normal.  ABD/GI/GU: BS+. Abd soft, NTND.  MSK/EXT: Left Muscle mass and subcu fat loss.  Left heel ulcer. SKIN: Deep sacral decubitus and left heel ulcer.  See picture under media NEURO: Sleepy but wakes to voice.  Does not talk or follow commands.  No facial asymmetry.  PERRL PSYCH: Calm.  No distress or agitation.  Consultants:  Neurology Orthopedic surgery Infectious disease Palliative medicine  Procedures: None  Microbiology summarized: COVID-19, influenza and RSV PCR nonreactive Blood cultures NGTD  Assessment and plan: Acute toxic and metabolic encephalopathy: Patient with possible underlying Lewy body dementia.  Temporal association of mental status change with recent initiation of Zyprexa .  Also seems to be on Xanax  at home.  CT head and MRI brain without acute finding.  EEG with moderate diffuse encephalopathy but no seizure or epileptiform discharge. UA does not suggest UTI.   Ammonia, B12, RPR, VBG and TSH unrevealing. -Appreciate input by neurology-signed off.  See final note by Dr. Merrianne on 7/23 -Completed trial of high-dose thiamine  on 7/27. -Cortrack placed and tube feed started on 7/25.  Tolerating TF at goal rate. 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  CRP elevated to 14.4.  ESR 39. Patient's wife refused amputation but requested ABI which showed  mild disease.  Ongoing fever. -Initially evaluated by Dr. Harden.  -Wife requesting second opinion on 7/28.  Evaluated by Dr. Barton who recommends consulting plastics.  Patient's wife tells me that Ortho has consulted plastics.  No one list in amion for Plastics. Attempted to call Dr. Lowery but went to voice mail. Sent her text message on her personal phone  without specifics about patient -CTX and daptomycin  7/24>> Augmentin  and doxycycline  7/28>> -Appreciate input by ID  Recurrent fever: Likely due to the above.  Blood cultures and RVP unrevealing. - ID recommended venous Doppler to rule out DVT. - Antibiotics as above  Acute urinary retention: Patient's wife reports history of enlarged prostate.  No prior abdominal imaging.  He had multiple I&O caths.  Initially, patient's wife refused indwelling Foley catheter but she later agrees after our discussion -Started Cardura  1 mg daily per tube on 7/27.  -Continue Foley catheter 7/26>>  Goal of care discussion: Patient's wife changed CODE STATUS to DNR on 7/28.  Now she is states there's no conversation about it. I said yesterday I would think about it, but did not agree. I'm done talking. he is not a dnr.  CODE STATUS changed to full code.  Palliative following.  Normocytic anemia: H&H seems to be at baseline.  Initial Hgb of 12-13 likely hemoconcentration.  No overt bleeding. 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  Parkinsonism/possible Lewy body dementia:  - Appreciate input by neurology.  Outpatient follow-up  Mild bilateral lower extremity PAD: LDL 49. - Low-dose aspirin  per tube  Hypokalemia/hypomagnesemia -Monitor replenish K and Mg as appropriate  Hyponatremia: Resolved.  Severe malnutrition/failure to thrive Body mass index is 18.14 kg/m. Nutrition Problem: Severe Malnutrition Etiology: social / environmental circumstances (frequent hospitalizations and SNF stay) Signs/Symptoms: severe muscle depletion, severe fat depletion, percent weight loss (14% x 6 months) Percent weight loss: 14  % Interventions: Juven, MVI, Prostat, Tube feeding  DVT prophylaxis:  enoxaparin  (LOVENOX ) injection 40 mg Start: 02/27/24 2200 SCDs Start: 02/25/24 2358  Code Status: Full code Family Communication: Updated patient's wife and niece at bedside. Level of care: Med-Surg Status is: Inpatient The patient will remain inpatient because: Acute encephalopathy, left heel osteomyelitis and failure to thrive   Final disposition: Home with home health.  Patient's wife not interested in SNF   55 minutes with more than 50% spent in reviewing records, counseling patient/family and coordinating care.   Sch Meds:  Scheduled Meds:  amoxicillin -clavulanate  1 tablet Per Tube Q12H   aspirin   81 mg Per Tube Daily   Chlorhexidine  Gluconate Cloth  6 each Topical Daily   collagenase    Topical Daily   doxazosin   1 mg Per Tube Daily   doxycycline   100 mg Per Tube Q12H   enoxaparin  (LOVENOX ) injection  40 mg Subcutaneous QHS   feeding supplement (PROSource TF20)  60 mL Per Tube Daily   free water   100 mL Per Tube Q4H   nutrition supplement (JUVEN)  1 packet Per Tube BID BM   mouth rinse  15 mL Mouth Rinse 4 times per day   thiamine   200 mg Per Tube Daily   Continuous Infusions:  feeding supplement (OSMOLITE 1.5 CAL) 1,000 mL (03/03/24 0545)  PRN Meds:.acetaminophen  **OR** acetaminophen , albuterol , ondansetron  **OR** ondansetron  (ZOFRAN ) IV, mouth rinse, polyethylene glycol  Antimicrobials: Anti-infectives (From admission, onward)    Start     Dose/Rate Route Frequency Ordered Stop   03/02/24 1130  doxycycline  (VIBRA -TABS) tablet 100 mg        100 mg Per Tube Every 12 hours 03/02/24 1041     03/02/24 1130  amoxicillin -clavulanate (AUGMENTIN ) 875-125 MG per tablet 1 tablet        1 tablet Per Tube Every 12 hours 03/02/24 1041     02/27/24 1400  cefTRIAXone  (ROCEPHIN ) 2 g in sodium chloride  0.9 % 100 mL IVPB  Status:  Discontinued        2 g 200 mL/hr over 30 Minutes Intravenous Every 24 hours  02/27/24 1247 03/02/24 1041   02/27/24 1400  DAPTOmycin  (CUBICIN ) IVPB 500 mg/50mL premix  Status:  Discontinued        500 mg 100 mL/hr over 30 Minutes Intravenous Daily 02/27/24 1247 03/02/24 1041        I have personally reviewed the following labs and images: CBC: Recent Labs  Lab 02/25/24 1434 02/25/24 1654 02/27/24 0307 02/28/24 0531 02/29/24 0315 03/01/24 0335 03/02/24 0459  WBC 10.6*   < > 10.0 10.1 7.9 6.7 8.6  NEUTROABS 7.8*  --   --   --   --   --   --   HGB 13.0   < > 10.9* 11.1* 9.9* 8.9* 9.5*  HCT 40.2   < > 33.2* 33.8* 30.1* 26.6* 29.2*  MCV 96.4   < > 97.1 96.3 95.0 94.3 93.6  PLT 295   < > 250 204 261 260 300   < > = values in this interval not displayed.   BMP &GFR Recent Labs  Lab 02/26/24 0612 02/27/24 0307 02/28/24 0531 02/29/24 0315 03/01/24 0335 03/02/24 0459  NA 142 144 146* 142 138  --   K 3.9 4.0 3.8 3.0* 4.0  --   CL 109 114* 114* 109 107  --   CO2 23 21* 21* 22 22  --   GLUCOSE 106* 102* 117* 150* 153*  --   BUN 35* 32* 23 24* 25*  --   CREATININE 0.88 0.87 0.89 0.72 0.70  --   CALCIUM  9.1 8.9 8.8* 8.5* 8.1*  --   MG  --  2.0 2.1 1.7 1.6* 2.0  PHOS  --  3.2 2.5 2.8 2.7 3.0   Estimated Creatinine Clearance: 60 mL/min (by C-G formula based on SCr of 0.7 mg/dL). Liver & Pancreas: Recent Labs  Lab 02/25/24 1434 02/27/24 0307 02/28/24 0531  AST 50*  --   --   ALT 19  --   --   ALKPHOS 79  --   --   BILITOT 1.2  --   --   PROT 6.7  --   --   ALBUMIN 3.3* 2.6* 2.4*   No results for input(s): LIPASE, AMYLASE in the last 168 hours. Recent Labs  Lab 02/25/24 1650  AMMONIA <13   Diabetic: No results for input(s): HGBA1C in the last 72 hours. Recent Labs  Lab 03/02/24 1932 03/02/24 2325 03/03/24 0409 03/03/24 0627 03/03/24 1154  GLUCAP 125* 139* 116* 109* 139*   Cardiac Enzymes: Recent Labs  Lab 02/28/24 0531 03/03/24 0449  CKTOTAL 264 54   No results for input(s): PROBNP in the last 8760 hours. Coagulation  Profile: No results for input(s): INR, PROTIME in the last 168 hours. Thyroid Function Tests: No results  for input(s): TSH, T4TOTAL, FREET4, T3FREE, THYROIDAB in the last 72 hours.  Lipid Profile: Recent Labs    03/01/24 1050  CHOL 83  HDL 28*  LDLCALC 49  TRIG 30  CHOLHDL 3.0   Anemia Panel: No results for input(s): VITAMINB12, FOLATE, FERRITIN, TIBC, IRON, RETICCTPCT in the last 72 hours.  Urine analysis:    Component Value Date/Time   COLORURINE YELLOW 02/25/2024 1703   APPEARANCEUR CLEAR 02/25/2024 1703   LABSPEC 1.024 02/25/2024 1703   PHURINE 5.0 02/25/2024 1703   GLUCOSEU NEGATIVE 02/25/2024 1703   HGBUR NEGATIVE 02/25/2024 1703   BILIRUBINUR NEGATIVE 02/25/2024 1703   KETONESUR NEGATIVE 02/25/2024 1703   PROTEINUR NEGATIVE 02/25/2024 1703   NITRITE NEGATIVE 02/25/2024 1703   LEUKOCYTESUR NEGATIVE 02/25/2024 1703   Sepsis Labs: Invalid input(s): PROCALCITONIN, LACTICIDVEN  Microbiology: Recent Results (from the past 240 hours)  Resp panel by RT-PCR (RSV, Flu A&B, Covid) Anterior Nasal Swab     Status: None   Collection Time: 02/25/24  3:49 PM   Specimen: Anterior Nasal Swab  Result Value Ref Range Status   SARS Coronavirus 2 by RT PCR NEGATIVE NEGATIVE Final   Influenza A by PCR NEGATIVE NEGATIVE Final   Influenza B by PCR NEGATIVE NEGATIVE Final    Comment: (NOTE) The Xpert Xpress SARS-CoV-2/FLU/RSV plus assay is intended as an aid in the diagnosis of influenza from Nasopharyngeal swab specimens and should not be used as a sole basis for treatment. Nasal washings and aspirates are unacceptable for Xpert Xpress SARS-CoV-2/FLU/RSV testing.  Fact Sheet for Patients: BloggerCourse.com  Fact Sheet for Healthcare Providers: SeriousBroker.it  This test is not yet approved or cleared by the [place]  FDA and has been authorized for detection and/or diagnosis of SARS-CoV-2  by FDA under an Emergency Use Authorization (EUA). This EUA will remain in effect (meaning this test can be used) for the duration of the COVID-19 declaration under Section 564(b)(1) of the Act, 21 U.S.C. section 360bbb-3(b)(1), unless the authorization is terminated or revoked.     Resp Syncytial Virus by PCR NEGATIVE NEGATIVE Final    Comment: (NOTE) Fact Sheet for Patients: BloggerCourse.com  Fact Sheet for Healthcare Providers: SeriousBroker.it  This test is not yet approved or cleared by the [place]  FDA and has been authorized for detection and/or diagnosis of SARS-CoV-2 by FDA under an Emergency Use Authorization (EUA). This EUA will remain in effect (meaning this test can be used) for the duration of the COVID-19 declaration under Section 564(b)(1) of the Act, 21 U.S.C. section 360bbb-3(b)(1), unless the authorization is terminated or revoked.  Performed at [facility], [address] [place], [place] [address]   Blood culture (routine x 2)     Status: None   Collection Time: 02/25/24  5:00 PM   Specimen: BLOOD RIGHT HAND  Result Value Ref Range Status   Specimen Description BLOOD RIGHT HAND  Final   Special Requests   Final    BOTTLES DRAWN AEROBIC ONLY Blood Culture results may not be optimal due to an inadequate volume of blood received in culture bottles   Culture   Final    NO GROWTH 5 DAYS Performed at [facility], [address] [place], [place] [address]    Report Status 03/01/2024 FINAL  Final  Blood culture (routine x 2)     Status: None   Collection Time: 02/25/24  5:10 PM   Specimen: BLOOD LEFT HAND  Result Value Ref Range Status   Specimen Description  BLOOD LEFT HAND  Final   Special Requests   Final    BOTTLES DRAWN AEROBIC ONLY Blood Culture results may not be optimal due to an inadequate volume of blood received in culture bottles   Culture   Final    NO GROWTH 5  DAYS Performed at [facility], [address], [place], [place] [address]    Report Status 03/01/2024 FINAL  Final  Respiratory (~20 pathogens) panel by PCR     Status: None   Collection Time: 03/02/24 10:39 AM   Specimen: Nasopharyngeal Swab; Respiratory  Result Value Ref Range Status   Adenovirus NOT DETECTED NOT DETECTED Final   Coronavirus 229E NOT DETECTED NOT DETECTED Final    Comment: (NOTE) The Coronavirus on the Respiratory Panel, DOES NOT test for the novel  Coronavirus (2019 nCoV)    Coronavirus HKU1 NOT DETECTED NOT DETECTED Final   Coronavirus NL63 NOT DETECTED NOT DETECTED Final   Coronavirus OC43 NOT DETECTED NOT DETECTED Final   Metapneumovirus NOT DETECTED NOT DETECTED Final   Rhinovirus / Enterovirus NOT DETECTED NOT DETECTED Final   Influenza A NOT DETECTED NOT DETECTED Final   Influenza B NOT DETECTED NOT DETECTED Final   Parainfluenza Virus 1 NOT DETECTED NOT DETECTED Final   Parainfluenza Virus 2 NOT DETECTED NOT DETECTED Final   Parainfluenza Virus 3 NOT DETECTED NOT DETECTED Final   Parainfluenza Virus 4 NOT DETECTED NOT DETECTED Final   Respiratory Syncytial Virus NOT DETECTED NOT DETECTED Final   Bordetella pertussis NOT DETECTED NOT DETECTED Final   Bordetella Parapertussis NOT DETECTED NOT DETECTED Final   Chlamydophila pneumoniae NOT DETECTED NOT DETECTED Final   Mycoplasma pneumoniae NOT DETECTED NOT DETECTED Final    Comment: Performed at [facility], [address] [place], [place] [address]    Radiology Studies: VAS US  LOWER EXTREMITY VENOUS (DVT) Result Date: 03/03/2024  Lower Venous DVT Study Patient Name:  KLAY SOBOTKA  Date of Exam:   03/03/2024 Medical Rec #: [id number]         Accession #:    [id number] Date of Birth: 1951-01-12          Patient Gender: M Patient Age:   70 years Exam Location:  [facility] Procedure:      VAS US  LOWER EXTREMITY VENOUS (DVT) Referring Phys: MIGNON Jerre Diguglielmo  --------------------------------------------------------------------------------  Indications: Fever. Other Indications: Posterior left heel pressure ulcer. Risk Factors: Stroke. Performing Technologist: Ricka Sturdivant-Jones RDMS, RVT  Examination Guidelines: A complete evaluation includes B-mode imaging, spectral Doppler, color Doppler, and power Doppler as needed of all accessible portions of each vessel. Bilateral testing is considered an integral part of a complete examination. Limited examinations for reoccurring indications may be performed as noted. The reflux portion of the exam is performed with the patient in reverse Trendelenburg.  +---------+---------------+---------+-----------+----------+--------------+ RIGHT    CompressibilityPhasicitySpontaneityPropertiesThrombus Aging +---------+---------------+---------+-----------+----------+--------------+ CFV      Full           Yes      Yes                                 +---------+---------------+---------+-----------+----------+--------------+ SFJ      Full                                                        +---------+---------------+---------+-----------+----------+--------------+  FV Prox  Full                                                        +---------+---------------+---------+-----------+----------+--------------+ FV Mid   Full                                                        +---------+---------------+---------+-----------+----------+--------------+ FV DistalFull                                                        +---------+---------------+---------+-----------+----------+--------------+ PFV      Full                                                        +---------+---------------+---------+-----------+----------+--------------+ POP      Full           Yes      Yes                                 +---------+---------------+---------+-----------+----------+--------------+ PTV       Full                                                        +---------+---------------+---------+-----------+----------+--------------+ PERO     Full                                                        +---------+---------------+---------+-----------+----------+--------------+   +---------+---------------+---------+-----------+----------+--------------+ LEFT     CompressibilityPhasicitySpontaneityPropertiesThrombus Aging +---------+---------------+---------+-----------+----------+--------------+ CFV      Full           Yes      Yes                                 +---------+---------------+---------+-----------+----------+--------------+ SFJ      Full                                                        +---------+---------------+---------+-----------+----------+--------------+ FV Prox  Full                                                        +---------+---------------+---------+-----------+----------+--------------+  FV Mid   Full                                                        +---------+---------------+---------+-----------+----------+--------------+ FV DistalFull                                                        +---------+---------------+---------+-----------+----------+--------------+ PFV      Full                                                        +---------+---------------+---------+-----------+----------+--------------+ POP      Full           Yes      Yes                                 +---------+---------------+---------+-----------+----------+--------------+ PTV      Full                                                        +---------+---------------+---------+-----------+----------+--------------+ PERO     Full                                                        +---------+---------------+---------+-----------+----------+--------------+    Summary: BILATERAL: - No evidence of deep vein  thrombosis seen in the lower extremities, bilaterally. -No evidence of popliteal cyst, bilaterally.   *See table(s) above for measurements and observations.    Preliminary        Wellington Winegarden T. Judas Mohammad [facility]  If 7PM-7AM, please contact night-coverage [email or web address] 03/03/2024, 12:32 PM

## 2024-03-03 NOTE — Plan of Care (Signed)
  Problem: Clinical Measurements: Goal: Ability to maintain clinical measurements within normal limits will improve Outcome: Progressing Goal: Will remain free from infection Outcome: Progressing Goal: Diagnostic test results will improve Outcome: Progressing Goal: Respiratory complications will improve Outcome: Progressing Goal: Cardiovascular complication will be avoided Outcome: Progressing   Problem: Activity: Goal: Risk for activity intolerance will decrease Outcome: Progressing   Problem: Elimination: Goal: Will not experience complications related to bowel motility Outcome: Progressing Goal: Will not experience complications related to urinary retention Outcome: Progressing   Problem: Pain Managment: Goal: General experience of comfort will improve and/or be controlled Outcome: Progressing   Problem: Safety: Goal: Ability to remain free from injury will improve Outcome: Progressing   Problem: Skin Integrity: Goal: Risk for impaired skin integrity will decrease Outcome: Progressing

## 2024-03-03 NOTE — Progress Notes (Signed)
 Venous duplex lower ext  has been completed. Refer to [facility] under chart review to view preliminary results.   03/03/2024  10:40 AM Alezander Dimaano, Ricka BIRCH

## 2024-03-03 NOTE — Care Management Important Message (Signed)
 Important Message  Patient Details  Name: Ernest Campbell MRN: [id number] Date of Birth: 12-06-1950   Important Message Given:  Yes - Medicare IM     [name] 03/03/2024, 10:21 AM

## 2024-03-03 NOTE — Progress Notes (Signed)
 Pt family requested Chaplain visit with pt. She said pt may not wake up. I visited patient.  Patient still sleeping.  I prayed over patient.  Chaplain available as needed.  Rayleen Dade, [place], [facility], Pager [phone]

## 2024-03-03 NOTE — Progress Notes (Signed)
 Patient ID: [id number] CHRISTINA, male   DOB: Jul 16, 1951, 73 y.o.   MRN: [id number]    Progress Note from the Palliative Medicine Team at [facility]   Patient Name: Ernest Campbell        Date: 03/03/2024 DOB: Dec 10, 1950  Age: 73 y.o. MRN#: [id number] Attending Physician: Kathrin Mignon DASEN, MD Primary Care Physician: Pcp, No Admit Date: 02/25/2024   Reason for Consultation/Follow-up   Establishing Goals of Care   HPI/ Brief Hospital Review  73 year old M with PMH of parkinsonism, possible Lewy body dementia, anemia, sacral and left heel decubitus presenting with altered mental status since 02/21/2024 after he started Zyprexa  by his neurologist, and admitted with acute toxic and metabolic encephalopathy.  Workup in ED significant for lactic acidosis.  CT head and MRI brain without acute finding.  MRI of left heel significant for  osteomyelitis.  Orthopedic surgery consulted but family declined surgery and chose nonoperative treatment.  Started on broad-spectrum antibiotics.  ID and palliative medicine consulted. Neurology has been consulted   Cortrack placed on 7/25 and started on TF.  Patient remains somnolent  Family face treatment option decisions, advanced directive decisions and anticipatory care needs.   Subjective  Extensive chart review has been completed prior to meeting with patient/family  including labs, vital signs, imaging, progress/consult notes, orders, medications and available advance directive documents.    This NP assessed patient at the bedside as a follow up for palliative medicine needs and emotional support.  Wife  at bedside.  Patient remains lethargic, unable to follow simple commands.  He is tolerating tube feed at goal rate.  Created space and opportunity for wife to explore thoughts and feelings regarding current medical situation.  This has been a long difficult journey for her and her husband over the last 6 months.   Ongoing education regarding current  medical situation.  Wife understands the seriousness of patient's current medical situation.   I left message with Dr Barton asking for updates on plastics consult.  Wife awaits feedback from plastics.   Education offered on the difference between a full medical support path attempting to prolong life versus a comfort supportive path allowing for nature to take its course.  Wife is open to all offered and available medical interventions to prolong life.  She remains hopeful for improvement.  Ultimately her hope is that her husband could return home   Education offered today regarding  the importance of continued conversation with family and their  medical providers regarding overall plan of care and treatment options,  ensuring decisions are within the context of the patients values and GOCs.  Questions and concerns addressed   Discussed with primary team and nursing staff  PMT will continue to support holistically   Time: 50  minutes  Detailed review of medical records ( labs, imaging, vital signs), medically appropriate exam ( MS, skin, cardiac,  resp)   discussed with treatment team, counseling and education to patient, family, staff, documenting clinical information, medication management, coordination of care    Ronal Plants NP  Palliative Medicine Team Team Phone # [phone] Pager [phone]

## 2024-03-03 NOTE — Plan of Care (Signed)
  Problem: Pain Managment: Goal: General experience of comfort will improve and/or be controlled Outcome: Progressing   Problem: Safety: Goal: Ability to remain free from injury will improve Outcome: Progressing   Problem: Skin Integrity: Goal: Risk for impaired skin integrity will decrease Outcome: Progressing

## 2024-03-03 NOTE — Progress Notes (Signed)
 [facility]  Date of Admission:  02/25/2024     Lines: Peripheral iv's  Abx: 7/28-c doxy 7/28-c augmentin   7/22-28 dapto/ceftriaxone   ASSESSMENT: 73 year old male with past medical history of anemia, possible parkinsonism,, Lewy body dementia, admitted with altered mental status, recently started on Zyprexa  ID engaged as found to have #Fevers in the setting of left calcaneal osteomyelitis and necrotic ulcer MRI showed osteomyelitis of the calcaneus, status orthopedics noted foul-smelling odor from necrotic ulcer of left heel Ortho deemed nonsurgical candidate Family doesn't want amputation Abi mild bilateral lower ext vasculopathy  ------------ 03/02/24 id assessment Ongoing fever now into hd #6. The left heel is rather unreamarkable and no abscess present. 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  Ddx lymphoma   ------------- 03/03/24 id assessment 7/28 ldh normal  7/29 repeat cpk 54 Duplex u/s extremities in process Ongoing fever maybe improving curve though? Last fever 100.5 on 7/28 Changed abx to doxy/augmentin  7/28   Wife thinks he is mentating a little better. Reports he has some pending outpatient pet scan  Again still query if fever related to the foot at all   Recommendations:- - f/u duplex extremities -- if ongoing fever and hemodynamic stable otherwise; would be ideally discharged to get outpatient pet scan which can be done with ct... an inpatient abd pelv ct unclear if will add much benefit -- continue doxy/augmentin  for 6 weeks ending 04/07/24 - Standard isolation precautions -- discussed with primary team   Principal Problem:   Altered mental status Active Problems:   AMS (altered mental status)    Toxic metabolic encephalopathy   PAD (peripheral artery disease) (HCC)   Subacute osteomyelitis of left foot (HCC)   Acute metabolic encephalopathy   Pressure injury of skin   Osteomyelitis (HCC)   Fever   Allergies  Allergen Reactions   Fentanyl  And Related Anaphylaxis   Other Other (See Comments)    According to the patient, all opioids cause hallucinations    Prednisone  Other (See Comments)    Hallucinations - Allergic, per [facility]   Benadryl  [Diphenhydramine ] Other (See Comments)    hallucinations   Codeine Other (See Comments)    Hallucinations - Allergic, per [facility]   Morphine  Other (See Comments)    Hallucinations - Allergic, per [facility]   Oxycodone Other (See Comments)    Hallucinations - Allergic, per [facility]    Scheduled Meds:  amoxicillin -clavulanate  1 tablet Per Tube Q12H   aspirin   81 mg Per Tube Daily   Chlorhexidine  Gluconate Cloth  6 each Topical Daily   collagenase    Topical Daily   doxazosin   1 mg Per Tube Daily   doxycycline   100 mg Per Tube Q12H   enoxaparin  (LOVENOX ) injection  40 mg Subcutaneous QHS   feeding supplement (PROSource TF20)  60 mL Per Tube Daily   free water   100 mL Per Tube Q4H   nutrition supplement (JUVEN)  1 packet Per Tube BID BM   mouth rinse  15 mL Mouth Rinse 4 times per day   thiamine   200 mg Per Tube Daily   Continuous Infusions:  feeding supplement (OSMOLITE 1.5 CAL) 1,000 mL (03/03/24 0545)   PRN Meds:.acetaminophen  **OR** acetaminophen , albuterol , ondansetron  **OR** ondansetron  (ZOFRAN ) IV, mouth rinse, polyethylene  glycol   SUBJECTIVE: Fever curve improving Mentating slightly better Cpk and ldh normal Wife at bedside   Review of Systems: ROS All other ROS was negative, except mentioned above     OBJECTIVE: Vitals:   03/02/24 1933 03/02/24 2326 03/03/24 0735 03/03/24 1152  BP: (!) 101/51 (!) 111/59 114/60 (!) 103/52  Pulse: 89 85 79 86  Resp: 18 18 18 18   Temp: (!) 100.5 F (38.1 C) 100.3 F (37.9 C) 99.7  F (37.6 C) 98 F (36.7 C)  TempSrc: Oral Oral Oral Oral  SpO2: 97% 99% 98% 99%  Weight:      Height:       Body mass index is 18.14 kg/m.  Physical Exam General/constitutional: chronically ill; eyes open but not focusing/tracking anyone HEENT: Normocephalic, PER, Conj Clear Neck supple CV: rrr no mrg Lungs: clear to auscultation, normal respiratory effort Abd: Soft, Nontender Ext/skin/msk: left heel pressure ulcer small; without fluctuance/purulence Neuro: eyes open; not following command; rigidity in extremities   Lab Results Lab Results  Component Value Date   WBC 8.6 03/02/2024   HGB 9.5 (L) 03/02/2024   HCT 29.2 (L) 03/02/2024   MCV 93.6 03/02/2024   PLT 300 03/02/2024    Lab Results  Component Value Date   CREATININE 0.70 03/01/2024   BUN 25 (H) 03/01/2024   NA 138 03/01/2024   K 4.0 03/01/2024   CL 107 03/01/2024   CO2 22 03/01/2024    Lab Results  Component Value Date   ALT 19 02/25/2024   AST 50 (H) 02/25/2024   ALKPHOS 79 02/25/2024   BILITOT 1.2 02/25/2024      Microbiology: Recent Results (from the past 240 hours)  Resp panel by RT-PCR (RSV, Flu A&B, Covid) Anterior Nasal Swab     Status: None   Collection Time: 02/25/24  3:49 PM   Specimen: Anterior Nasal Swab  Result Value Ref Range Status   SARS Coronavirus 2 by RT PCR NEGATIVE NEGATIVE Final   Influenza A by PCR NEGATIVE NEGATIVE Final   Influenza B by PCR NEGATIVE NEGATIVE Final    Comment: (NOTE) The Xpert Xpress SARS-CoV-2/FLU/RSV plus assay is intended as an aid in the diagnosis of influenza from Nasopharyngeal swab specimens and should not be used as a sole basis for treatment. Nasal washings and aspirates are unacceptable for Xpert Xpress SARS-CoV-2/FLU/RSV testing.  Fact Sheet for Patients: BloggerCourse.com  Fact Sheet for Healthcare Providers: SeriousBroker.it  This test is not yet approved or cleared by the [place]   FDA and has been authorized for detection and/or diagnosis of SARS-CoV-2 by FDA under an Emergency Use Authorization (EUA). This EUA will remain in effect (meaning this test can be used) for the duration of the COVID-19 declaration under Section 564(b)(1) of the Act, 21 U.S.C. section 360bbb-3(b)(1), unless the authorization is terminated or revoked.     Resp Syncytial Virus by PCR NEGATIVE NEGATIVE Final    Comment: (NOTE) Fact Sheet for Patients: BloggerCourse.com  Fact Sheet for Healthcare Providers: SeriousBroker.it  This test is not yet approved or cleared by the [place]  FDA and has been authorized for detection and/or diagnosis of SARS-CoV-2 by FDA under an Emergency Use Authorization (EUA). This EUA will remain in effect (meaning this test can be used) for the duration of the COVID-19 declaration under Section 564(b)(1) of the Act, 21 U.S.C. section 360bbb-3(b)(1), unless the authorization is terminated or revoked.  Performed at [facility], [address], [place], [place] [address]   Blood  culture (routine x 2)     Status: None   Collection Time: 02/25/24  5:00 PM   Specimen: BLOOD RIGHT HAND  Result Value Ref Range Status   Specimen Description BLOOD RIGHT HAND  Final   Special Requests   Final    BOTTLES DRAWN AEROBIC ONLY Blood Culture results may not be optimal due to an inadequate volume of blood received in culture bottles   Culture   Final    NO GROWTH 5 DAYS Performed at [facility], [address] [place], [place] [address]    Report Status 03/01/2024 FINAL  Final  Blood culture (routine x 2)     Status: None   Collection Time: 02/25/24  5:10 PM   Specimen: BLOOD LEFT HAND  Result Value Ref Range Status   Specimen Description BLOOD LEFT HAND  Final   Special Requests   Final    BOTTLES DRAWN AEROBIC ONLY Blood Culture results may not be optimal due to an inadequate volume of blood  received in culture bottles   Culture   Final    NO GROWTH 5 DAYS Performed at [facility], [address] [place], [place] [address]    Report Status 03/01/2024 FINAL  Final  Respiratory (~20 pathogens) panel by PCR     Status: None   Collection Time: 03/02/24 10:39 AM   Specimen: Nasopharyngeal Swab; Respiratory  Result Value Ref Range Status   Adenovirus NOT DETECTED NOT DETECTED Final   Coronavirus 229E NOT DETECTED NOT DETECTED Final    Comment: (NOTE) The Coronavirus on the Respiratory Panel, DOES NOT test for the novel  Coronavirus (2019 nCoV)    Coronavirus HKU1 NOT DETECTED NOT DETECTED Final   Coronavirus NL63 NOT DETECTED NOT DETECTED Final   Coronavirus OC43 NOT DETECTED NOT DETECTED Final   Metapneumovirus NOT DETECTED NOT DETECTED Final   Rhinovirus / Enterovirus NOT DETECTED NOT DETECTED Final   Influenza A NOT DETECTED NOT DETECTED Final   Influenza B NOT DETECTED NOT DETECTED Final   Parainfluenza Virus 1 NOT DETECTED NOT DETECTED Final   Parainfluenza Virus 2 NOT DETECTED NOT DETECTED Final   Parainfluenza Virus 3 NOT DETECTED NOT DETECTED Final   Parainfluenza Virus 4 NOT DETECTED NOT DETECTED Final   Respiratory Syncytial Virus NOT DETECTED NOT DETECTED Final   Bordetella pertussis NOT DETECTED NOT DETECTED Final   Bordetella Parapertussis NOT DETECTED NOT DETECTED Final   Chlamydophila pneumoniae NOT DETECTED NOT DETECTED Final   Mycoplasma pneumoniae NOT DETECTED NOT DETECTED Final    Comment: Performed at [facility], [address] [place], [place] [address]     Serology:   Imaging: If present, new imagings (plain films, ct scans, and mri) have been personally visualized and interpreted; radiology reports have been reviewed. Decision making incorporated into the Impression / Recommendations.  7/26 chest ct 1. Airway thickening is present especially in both lower lobes with some degree of airway plugging more notable in the left lower  lobe, and bandlike regions of volume loss and mild airspace opacity in both lower lobes favoring atelectasis although aspiration pneumonitis is included in the differential diagnosis. 2. Mitral valve calcification. 3. Left greater than right degenerative glenohumeral arthropathy. 4.  Aortic Atherosclerosis    7/23 mri left heel FINDINGS: Bones/Joint/Cartilage   Abnormal edema and enhancement in the posteroinferior calcaneus as on image 15 series 1043 compatible with osteomyelitis.   Ligaments   Suboptimal characterization due to imaging plane choices. Grossly intact.  Muscles and Tendons   Trace nonspecific edema the quadratus plantae muscle potentially from mild myositis. Mild distal Achilles tendinopathy.   Soft tissues   Deep ulceration along the posteroinferior heel extending to the periosteal margin of the posteroinferior calcaneus on image 14 series 1043.   IMPRESSION: 1. 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 2. Trace nonspecific edema in the quadratus plantae muscle potentially from mild myositis. 3. Mild distal Achilles tendinopathy.    7/27 abi Right: Resting right ankle-brachial index indicates mild right lower  extremity arterial disease. The right toe-brachial index is abnormal.   Left: Resting left ankle-brachial index indicates mild left lower  extremity arterial disease. The left toe-brachial index is abnormal.    Constance ONEIDA Passer, MD [facility] [phone] pager    03/03/2024, 3:00 PM

## 2024-03-04 ENCOUNTER — Telehealth: Payer: Self-pay

## 2024-03-04 DIAGNOSIS — L97426 Non-pressure chronic ulcer of left heel and midfoot with bone involvement without evidence of necrosis: Secondary | ICD-10-CM

## 2024-03-04 DIAGNOSIS — M869 Osteomyelitis, unspecified: Secondary | ICD-10-CM | POA: Diagnosis not present

## 2024-03-04 DIAGNOSIS — R410 Disorientation, unspecified: Secondary | ICD-10-CM

## 2024-03-04 DIAGNOSIS — G9340 Encephalopathy, unspecified: Secondary | ICD-10-CM | POA: Diagnosis not present

## 2024-03-04 DIAGNOSIS — F0280 Dementia in other diseases classified elsewhere without behavioral disturbance: Secondary | ICD-10-CM | POA: Diagnosis not present

## 2024-03-04 DIAGNOSIS — G3183 Dementia with Lewy bodies: Secondary | ICD-10-CM | POA: Diagnosis not present

## 2024-03-04 DIAGNOSIS — G9341 Metabolic encephalopathy: Secondary | ICD-10-CM | POA: Diagnosis not present

## 2024-03-04 DIAGNOSIS — R4182 Altered mental status, unspecified: Secondary | ICD-10-CM | POA: Diagnosis not present

## 2024-03-04 LAB — GLUCOSE, CAPILLARY
Glucose-Capillary: 139 mg/dL — ABNORMAL HIGH (ref 70–99)
Glucose-Capillary: 163 mg/dL — ABNORMAL HIGH (ref 70–99)
Glucose-Capillary: 131 mg/dL — ABNORMAL HIGH (ref 70–99)

## 2024-03-04 MED ORDER — OSMOLITE 1.5 CAL PO LIQD: 1000.0000 mL | ORAL | Status: DC

## 2024-03-04 MED ORDER — OSMOLITE 1.5 CAL PO LIQD
1000.0000 mL | ORAL | Status: DC
  Administered 2024-03-04 – 2024-03-11 (×7): 1000 mL
  Filled 2024-03-04 (×4): qty 1000

## 2024-03-04 NOTE — Telephone Encounter (Signed)
 Niece, Arland, cld to inquire about Pt taking carbidopa /levodopa . Made niece aware she is not listed on DPR and therefore unable to receive any information. Niece voiced understanding and stated she will be with Pt wife soon and will have her call back. Thanked niece for understanding.

## 2024-03-04 NOTE — Progress Notes (Addendum)
 PROGRESS NOTE  Ernest Campbell FMW:[id number] DOB: [birth year]   PCP: Pcp, No  Patient is from: Home.  DOA: 02/25/2024 LOS: 6   Brief Narrative / Interim history: 73 year old M with PMH of parkinsonism, possible Lewy body dementia, anemia, sacral and left heel decubitus presenting with altered mental status since 02/21/2024 after he started Zyprexa  by his neurologist, and admitted with acute toxic and metabolic encephalopathy.  Workup in ED significant for lactic acidosis.  CT head and MRI brain without acute finding.  Left heel x-ray raise concern for osteomyelitis.  Neurology consulted. The next day, MRI of left heel suggested osteomyelitis.  Orthopedic surgery consulted but family declined surgery and chose nonoperative treatment.  Started on broad-spectrum antibiotics.  ID and palliative medicine consulted. Cortrack placed on 7/25 and started on TF.    Subjective: No overnight events reported.  Patient remains somnolent.  Does not respond to questions.  No family at bedside.    Objective: Vitals:   03/04/24 0036 03/04/24 0422 03/04/24 0734 03/04/24 0800  BP:  107/63 123/65   Pulse:  74 83   Resp:  18 (!) 21   Temp: 98.8 F (37.1 C) 99.2 F (37.3 C) 98.3 F (36.8 C)   TempSrc: Axillary Oral Oral   SpO2:  98% 98%   Weight:    36.3 kg  Height:        Examination:  General appearance: poorly response.  Does not answer any questions. Nasogastric feeding tube noted. Resp: Clear to auscultation bilaterally.  Normal effort Cardio: S1-S2 is normal regular.  No S3-S4.  No rubs murmurs or bruit GI: Abdomen is soft.  Nontender nondistended.  Bowel sounds are present normal.  No masses organomegaly Extremities: No edema.   Fully responsive without any obvious focal neurological deficits.  Consultants:  Neurology Orthopedic surgery Infectious disease Palliative medicine  Procedures: None  Microbiology summarized: COVID-19, influenza and RSV PCR nonreactive Blood cultures  NGTD  Assessment and plan:  Acute toxic and metabolic encephalopathy:  Patient with possible underlying Lewy body dementia.  Temporal association of mental status change with recent initiation of Zyprexa .  Was given one dose of xanax  for a PET scan but was not on it on a regular basis.  CT head and MRI brain without acute finding.  EEG with moderate diffuse encephalopathy but no seizure or epileptiform discharge. UA does not suggest UTI.   Ammonia, B12, RPR, VBG and TSH unrevealing. -Appreciate input by neurology-signed off.  See final note by Dr. Merrianne on 7/23 -Completed trial of high-dose thiamine  on 7/27. -Cortrack placed and tube feed started on 7/25.  Tolerating TF at goal rate. -Reorientation and delirium precaution -Palliative medicine on board No significant change in mentation noted.  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  CRP elevated to 14.4.  ESR 39. Patient's wife refused amputation but requested ABI which showed mild disease.  Ongoing fever. -Initially evaluated by Dr. Harden.  -Wife requesting second opinion on 7/28.  Evaluated by Dr. Barton who recommends consulting plastics.   ID was also consulted.  Patient has been on various different antibiotics including ceftriaxone  daptomycin  Augmentin  and doxycycline .  Currently on Augmentin  and doxycycline .  ID will follow patient in the outpatient setting. Due to persistent fever patient underwent CT scan of the abdomen pelvis which did not show any acute findings. Doppler studies without any DVT. WBC was normal on 7/28.  Tmax 100.8 over the last 24 hours.  Continue  to monitor.  Recurrent fever:  See above.  Fever of unknown origin.  Workup unremarkable so far including CT of the abdomen pelvis and Doppler studies.  Check procalcitonin.  Acute urinary retention:  Patient's wife reports history of enlarged prostate.  No prior abdominal imaging.  He  had multiple I&O caths.  Initially patient's wife declined Foley but then subsequently agreed. -Started Cardura  1 mg daily per tube on 7/27.  -Continue Foley catheter 7/26>>  Goal of care discussion:  Palliative care is following.  Patient's wife has gone back and forth DNR.  Currently full code.    Normocytic anemia:  H&H seems to be at baseline.    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  Parkinsonism/possible Lewy body dementia:  - Appreciate input by neurology.  Outpatient follow-up  Mild bilateral lower extremity PAD:  LDL 49. - Low-dose aspirin  per tube  Hypokalemia/hypomagnesemia Continue to monitor and supplement as indicated.  Hyponatremia: Resolved.  Severe malnutrition/failure to thrive Body mass index is 13.31 kg/m. Nutrition Problem: Severe Malnutrition Etiology: social / environmental circumstances (frequent hospitalizations and SNF stay) Signs/Symptoms: severe muscle depletion, severe fat depletion, percent weight loss (14% x 6 months) Percent weight loss: 14 % Interventions: Juven, MVI, Prostat, Tube feeding Continue with tube feedings.  Await mentation to improve so that oral feeds can be attempted.  DVT prophylaxis: Lovenox  Code Status: Full code Family Communication: No family at bedside today. Disposition: To be determined.  Family not interested in SNF.   Sch Meds:  Scheduled Meds:  amoxicillin -clavulanate  1 tablet Per Tube Q12H   aspirin   81 mg Per Tube Daily   carbidopa -levodopa   1 tablet Per Tube TID   Chlorhexidine  Gluconate Cloth  6 each Topical Daily   collagenase    Topical Daily   doxazosin   1 mg Per Tube Daily   doxycycline   100 mg Per Tube Q12H   enoxaparin  (LOVENOX ) injection  40 mg Subcutaneous QHS   free water   100 mL Per Tube Q4H   nutrition supplement (JUVEN)  1 packet Per Tube BID BM   mouth rinse  15 mL Mouth Rinse 4 times per day   thiamine   200 mg Per Tube Daily   Continuous  Infusions:  feeding supplement (OSMOLITE 1.5 CAL) 1,000 mL (03/04/24 0843)   PRN Meds:.acetaminophen  **OR** acetaminophen , albuterol , ondansetron  **OR** ondansetron  (ZOFRAN ) IV, mouth rinse, polyethylene glycol  Antimicrobials: Anti-infectives (From admission, onward)    Start     Dose/Rate Route Frequency Ordered Stop   03/02/24 1130  doxycycline  (VIBRA -TABS) tablet 100 mg        100 mg Per Tube Every 12 hours 03/02/24 1041     03/02/24 1130  amoxicillin -clavulanate (AUGMENTIN ) 875-125 MG per tablet 1 tablet        1 tablet Per Tube Every 12 hours 03/02/24 1041     02/27/24 1400  cefTRIAXone  (ROCEPHIN ) 2 g in sodium chloride  0.9 % 100 mL IVPB  Status:  Discontinued        2 g 200 mL/hr over 30 Minutes Intravenous Every 24 hours 02/27/24 1247 03/02/24 1041   02/27/24 1400  DAPTOmycin  (CUBICIN ) IVPB 500 mg/88mL premix  Status:  Discontinued        500 mg 100 mL/hr over 30 Minutes Intravenous Daily 02/27/24 1247 03/02/24 1041        CBC: Recent Labs  Lab 02/27/24 0307 02/28/24 0531 02/29/24 0315 03/01/24 0335 03/02/24 0459  WBC 10.0 10.1 7.9 6.7 8.6  HGB 10.9* 11.1* 9.9* 8.9* 9.5*  HCT 33.2* 33.8* 30.1* 26.6* 29.2*  MCV 97.1 96.3 95.0 94.3 93.6  PLT 250 204 261 260 300   BMP &GFR Recent Labs  Lab 02/27/24 0307 02/28/24 0531 02/29/24 0315 03/01/24 0335 03/02/24 0459  NA 144 146* 142 138  --   K 4.0 3.8 3.0* 4.0  --   CL 114* 114* 109 107  --   CO2 21* 21* 22 22  --   GLUCOSE 102* 117* 150* 153*  --   BUN 32* 23 24* 25*  --   CREATININE 0.87 0.89 0.72 0.70  --   CALCIUM  8.9 8.8* 8.5* 8.1*  --   MG 2.0 2.1 1.7 1.6* 2.0  PHOS 3.2 2.5 2.8 2.7 3.0   Estimated Creatinine Clearance: 44.1 mL/min (by C-G formula based on SCr of 0.7 mg/dL).  Liver & Pancreas: Recent Labs  Lab 02/27/24 0307 02/28/24 0531  ALBUMIN 2.6* 2.4*     Diabetic: Recent Labs  Lab 03/03/24 0627 03/03/24 1154 03/03/24 1748 03/03/24 2340 03/04/24 0635  GLUCAP 109* 139* 169* 137* 139*    Cardiac Enzymes: Recent Labs  Lab 02/28/24 0531 03/03/24 0449  CKTOTAL 264 54   Lipid Profile: Recent Labs    03/01/24 1050  CHOL 83  HDL 28*  LDLCALC 49  TRIG 30  CHOLHDL 3.0    Microbiology: Recent Results (from the past 240 hours)  Resp panel by RT-PCR (RSV, Flu A&B, Covid) Anterior Nasal Swab     Status: None   Collection Time: 02/25/24  3:49 PM   Specimen: Anterior Nasal Swab  Result Value Ref Range Status   SARS Coronavirus 2 by RT PCR NEGATIVE NEGATIVE Final   Influenza A by PCR NEGATIVE NEGATIVE Final   Influenza B by PCR NEGATIVE NEGATIVE Final    Comment: (NOTE) The Xpert Xpress SARS-CoV-2/FLU/RSV plus assay is intended as an aid in the diagnosis of influenza from Nasopharyngeal swab specimens and should not be used as a sole basis for treatment. Nasal washings and aspirates are unacceptable for Xpert Xpress SARS-CoV-2/FLU/RSV testing.  Fact Sheet for Patients: BloggerCourse.com  Fact Sheet for Healthcare Providers: SeriousBroker.it  This test is not yet approved or cleared by the [place]  FDA and has been authorized for detection and/or diagnosis of SARS-CoV-2 by FDA under an Emergency Use Authorization (EUA). This EUA will remain in effect (meaning this test can be used) for the duration of the COVID-19 declaration under Section 564(b)(1) of the Act, 21 U.S.C. section 360bbb-3(b)(1), unless the authorization is terminated or revoked.     Resp Syncytial Virus by PCR NEGATIVE NEGATIVE Final    Comment: (NOTE) Fact Sheet for Patients: BloggerCourse.com  Fact Sheet for Healthcare Providers: SeriousBroker.it  This test is not yet approved or cleared by the [place]  FDA and has been authorized for detection and/or diagnosis of SARS-CoV-2 by FDA under an Emergency Use Authorization (EUA). This EUA will remain in effect (meaning this test can  be used) for the duration of the COVID-19 declaration under Section 564(b)(1) of the Act, 21 U.S.C. section 360bbb-3(b)(1), unless the authorization is terminated or revoked.  Performed at [facility], [address] [place], [place] [address]   Blood culture (routine x 2)     Status: None   Collection Time: 02/25/24  5:00 PM   Specimen: BLOOD RIGHT HAND  Result Value Ref Range Status   Specimen Description BLOOD RIGHT HAND  Final   Special Requests   Final  BOTTLES DRAWN AEROBIC ONLY Blood Culture results may not be optimal due to an inadequate volume of blood received in culture bottles   Culture   Final    NO GROWTH 5 DAYS Performed at [facility], [address], [place], [place] [address]    Report Status 03/01/2024 FINAL  Final  Blood culture (routine x 2)     Status: None   Collection Time: 02/25/24  5:10 PM   Specimen: BLOOD LEFT HAND  Result Value Ref Range Status   Specimen Description BLOOD LEFT HAND  Final   Special Requests   Final    BOTTLES DRAWN AEROBIC ONLY Blood Culture results may not be optimal due to an inadequate volume of blood received in culture bottles   Culture   Final    NO GROWTH 5 DAYS Performed at [facility], [address], [place], [place] [address]    Report Status 03/01/2024 FINAL  Final  Respiratory (~20 pathogens) panel by PCR     Status: None   Collection Time: 03/02/24 10:39 AM   Specimen: Nasopharyngeal Swab; Respiratory  Result Value Ref Range Status   Adenovirus NOT DETECTED NOT DETECTED Final   Coronavirus 229E NOT DETECTED NOT DETECTED Final    Comment: (NOTE) The Coronavirus on the Respiratory Panel, DOES NOT test for the novel  Coronavirus (2019 nCoV)    Coronavirus HKU1 NOT DETECTED NOT DETECTED Final   Coronavirus NL63 NOT DETECTED NOT DETECTED Final   Coronavirus OC43 NOT DETECTED NOT DETECTED Final   Metapneumovirus NOT DETECTED NOT DETECTED Final   Rhinovirus / Enterovirus NOT DETECTED NOT  DETECTED Final   Influenza A NOT DETECTED NOT DETECTED Final   Influenza B NOT DETECTED NOT DETECTED Final   Parainfluenza Virus 1 NOT DETECTED NOT DETECTED Final   Parainfluenza Virus 2 NOT DETECTED NOT DETECTED Final   Parainfluenza Virus 3 NOT DETECTED NOT DETECTED Final   Parainfluenza Virus 4 NOT DETECTED NOT DETECTED Final   Respiratory Syncytial Virus NOT DETECTED NOT DETECTED Final   Bordetella pertussis NOT DETECTED NOT DETECTED Final   Bordetella Parapertussis NOT DETECTED NOT DETECTED Final   Chlamydophila pneumoniae NOT DETECTED NOT DETECTED Final   Mycoplasma pneumoniae NOT DETECTED NOT DETECTED Final    Comment: Performed at [facility], [address], [place], [place] [address]    Radiology Studies: CT ABDOMEN PELVIS W CONTRAST Result Date: 03/03/2024 CLINICAL DATA:  Sepsis with fever EXAM: CT ABDOMEN AND PELVIS WITH CONTRAST TECHNIQUE: Multidetector CT imaging of the abdomen and pelvis was performed using the standard protocol following bolus administration of intravenous contrast. RADIATION DOSE REDUCTION: This exam was performed according to the departmental dose-optimization program which includes automated exposure control, adjustment of the mA and/or kV according to patient size and/or use of iterative reconstruction technique. CONTRAST:  75mL OMNIPAQUE  IOHEXOL  350 MG/ML SOLN COMPARISON:  None Available. FINDINGS: Lower chest: There is some atelectasis in both lung bases. Hepatobiliary: No focal liver abnormality is seen. No gallstones, gallbladder wall thickening, or biliary dilatation. Pancreas: Unremarkable. No pancreatic ductal dilatation or surrounding inflammatory changes. Spleen: Normal in size without focal abnormality. Adrenals/Urinary Tract: The bilateral kidneys and adrenal glands are within normal limits. The bladder is decompressed by Foley catheter. Cannot exclude diffuse bladder wall thickening. Stomach/Bowel: Stomach is within normal limits. Enteric tube  tip is in the gastric antrum. Appendix is not seen. No evidence of bowel wall thickening, distention, or inflammatory changes. There is sigmoid colon diverticulosis. Vascular/Lymphatic: Aortic  atherosclerosis. No enlarged abdominal or pelvic lymph nodes. Reproductive: Prostate is unremarkable. Other: There is no ascites or focal abdominal wall hernia. There is a small focus of subcutaneous air in the lower anterior abdominal wall image 3/77. Musculoskeletal: Right hip screw is present. IMPRESSION: 1. No acute localizing process in the abdomen or pelvis. 2. Bladder is decompressed by Foley catheter. Cannot exclude diffuse bladder wall thickening. Correlate clinically for cystitis. 3. Small focus of subcutaneous air in the lower anterior abdominal wall, possibly related to recent surgery or injection site. 4. Sigmoid colon diverticulosis. Aortic Atherosclerosis (ICD10-I70.0). Electronically Signed   By: Greig Pique M.D.   On: 03/03/2024 16:19   VAS US  UPPER EXTREMITY VENOUS DUPLEX Result Date: 03/03/2024 UPPER VENOUS STUDY  Patient Name:  Ernest Campbell  Date of Exam:   03/03/2024 Medical Rec #: [id number]         Accession #:    [id number] Date of Birth: Nov 01, 1950          Patient Gender: M Patient Age:   49 years Exam Location:  [facility] Procedure:      VAS US  UPPER EXTREMITY VENOUS DUPLEX Referring Phys: TRUNG VU --------------------------------------------------------------------------------  Indications: Swelling Limitations: Very limited exam due to patient resisting and not allowing to position extremity as need to imaging. Performing Technologist: Elmarie Lindau, RVT  Examination Guidelines: A complete evaluation includes B-mode imaging, spectral Doppler, color Doppler, and power Doppler as needed of all accessible portions of each vessel. Bilateral testing is considered an integral part of a complete examination. Limited examinations for reoccurring indications may be performed as noted.  Right  Findings: +----------+------------+---------+-----------+----------+-------+ RIGHT     CompressiblePhasicitySpontaneousPropertiesSummary +----------+------------+---------+-----------+----------+-------+ IJV           Full       Yes                                +----------+------------+---------+-----------+----------+-------+ Subclavian               Yes                                +----------+------------+---------+-----------+----------+-------+ Brachial      Full                                          +----------+------------+---------+-----------+----------+-------+ Basilic       Full                                          +----------+------------+---------+-----------+----------+-------+  Left Findings: +----------+------------+---------+-----------+----------+-------+ LEFT      CompressiblePhasicitySpontaneousPropertiesSummary +----------+------------+---------+-----------+----------+-------+ IJV           Full       Yes                                +----------+------------+---------+-----------+----------+-------+ Subclavian               Yes                                +----------+------------+---------+-----------+----------+-------+  Summary:  Right: No evidence of right upper extremity DVT/SVT in areas visualized.  Left: No evidence of left upper extremity DVT in areas visualized.  *See table(s) above for measurements and observations.    Preliminary     Joette Pebbles  [facility]  If 7PM-7AM, please contact night-coverage [email or web address] 03/04/2024, 10:28 AM

## 2024-03-04 NOTE — Progress Notes (Signed)
 Brief Nutrition Support Note  Discussed wight with RN. Pt currently on air mattress bed and unsure if bed was zeroed properly prior to pt being placed in it. Weight taken by RD on 7/24 was ~109 lbs. This was without SCD pump and bed pump attached. Pt had prevalon boots on at that time. RN reports that this AM, weights range from 127-89 lb depending on it all equipment is present verses none (including prevalon boots). Weights highly variable.To get true weight, pt would need to be lifted, bed zeroed, and then pt re-weighed. On follow-up Monday, pt did not appear to visually have lost weight. However, out of abundance of caution will increase TF rate slightly to provide additional kcal and protein. Will obtain accurate measured weight when feasible. Normal BM at this time. Discussed TF changes with RN.  Estimated Nutritional Needs:  Kcal:  1700-2000 kcal/d Protein:  80-100g/d Fluid:  1.8-2L/d    INTERVENTION:  Continue tube feeding via cortrak: Osmolite 1.5 at 60 ml/h (1440 ml per day) Free water : 100mL every 4 hours Provides 2160 kcal, 90 gm protein, 1097 ml free water  daily (TF+flush = free water ) 1 packet Juven BID, each packet provides 95 calories, 2.5 grams of protein (collagen) + micronutrients to support wound healing   Vernell Lukes, RD, LDN, CNSC Registered Dietitian II Please reach out via secure chat

## 2024-03-04 NOTE — Progress Notes (Signed)
 NEUROLOGY CONSULT FOLLOW UP NOTE   Date of service: March 04, 2024 Patient Name: Ernest Campbell MRN:  [id number] DOB:  12-14-1950  Interval Hx/subjective   Neurology has been asked to reevaluate given persistent encephalopathy.  I had a long discussion with his wife, he has had significant decline over the past 6 months.  One thing that she has noted is that whenever he receives any type of neuroleptics, he appears to be exquisitely sensitive to them.  In December he got a migraine cocktail and became very confused afterwards, my suspicion at the time was that it was likely related to Benadryl  and fentanyl , but he also received the Reglan  at the same time.  He received Haldol  several times while he was at [facility] and had a very prolonged encephalopathy following each administration of this.  He has never gotten back to the point where he was prior to his admission in December, and at his most recent neurology appointment had an MMSE of 13.  Of note, his mother suffered from  Lewy body dementia.  He has had a longstanding issue with visuospatial issues, as well as hallucinations.  Vitals   Vitals:   03/04/24 0800 03/04/24 1159 03/04/24 1529 03/04/24 1944  BP:  (!) 115/53 (!) 113/45 121/62  Pulse:  93 94 88  Resp:  (!) 22 17 (!) 22  Temp:  98.3 F (36.8 C) 98.6 F (37 C) 98.3 F (36.8 C)  TempSrc:  Oral Oral Oral  SpO2:  98% 96% 100%  Weight: 36.3 kg     Height:         Body mass index is 13.31 kg/m.  Physical Exam   Constitutional: Appears elderly, NG tube in place  Neurologic Examination    MS: opens eyes to noxious stimulaiton.  After repeatedly applying noxious stimulation to improve his mental status, he does tell me his name is Ernest Campbell but is very dysarthric and difficult to understand.  He does not answer any other questions intelligibly and does not follow any commands whatsoever. CN: He blinks to threat bilaterally, he does not look fully to either side but keeps  his eyes relatively midline. Motor: He has increased tone throughout, he withdraws all extremities to noxious stimulation Sensory: As above  Medications  Current Facility-Administered Medications:    acetaminophen  (TYLENOL ) tablet 650 mg, 650 mg, Oral, Q6H PRN, 650 mg at 03/04/24 0859 **OR** acetaminophen  (TYLENOL ) suppository 650 mg, 650 mg, Rectal, Q6H PRN, Gonfa, Taye T, MD   albuterol  (PROVENTIL ) (2.5 MG/3ML) 0.083% nebulizer solution 2.5 mg, 2.5 mg, Nebulization, Q2H PRN, Ezenduka, Nkeiruka J, MD, 2.5 mg at 02/26/24 2207   amoxicillin -clavulanate (AUGMENTIN ) 875-125 MG per tablet 1 tablet, 1 tablet, Per Tube, Q12H, Vu, Trung T, MD, 1 tablet at 03/04/24 0900   aspirin  chewable tablet 81 mg, 81 mg, Per Tube, Daily, Gonfa, Taye T, MD, 81 mg at 03/04/24 0900   carbidopa -levodopa  (SINEMET  IR) 25-100 MG per tablet immediate release 1 tablet, 1 tablet, Per Tube, TID, Gonfa, Taye T, MD, 1 tablet at 03/04/24 1604   Chlorhexidine  Gluconate Cloth 2 % PADS 6 each, 6 each, Topical, Daily, Gonfa, Taye T, MD, 6 each at 03/04/24 9095   collagenase  (SANTYL ) ointment, , Topical, Daily, Gonfa, Taye T, MD, Given at 03/04/24 9095   doxazosin  (CARDURA ) tablet 1 mg, 1 mg, Per Tube, Daily, Gonfa, Taye T, MD, 1 mg at 03/04/24 0900   doxycycline  (VIBRA -TABS) tablet 100 mg, 100 mg, Per Tube, Q12H, Vu, Constance DASEN, MD, 100  mg at 03/04/24 0859   enoxaparin  (LOVENOX ) injection 40 mg, 40 mg, Subcutaneous, QHS, Pham, Minh Q, RPH-CPP, 40 mg at 03/03/24 2143   feeding supplement (OSMOLITE 1.5 CAL) liquid 1,000 mL, 1,000 mL, Per Tube, Continuous, Verdene Purchase, MD, Last Rate: 60 mL/hr at 03/04/24 2028, 1,000 mL at 03/04/24 2028   free water  100 mL, 100 mL, Per Tube, Q4H, Gonfa, Taye T, MD, 100 mL at 03/04/24 2025   nutrition supplement (JUVEN) (JUVEN) powder packet 1 packet, 1 packet, Per Tube, BID BM, Gonfa, Taye T, MD, 1 packet at 03/04/24 1339   ondansetron  (ZOFRAN ) tablet 4 mg, 4 mg, Oral, Q6H PRN **OR** ondansetron   (ZOFRAN ) injection 4 mg, 4 mg, Intravenous, Q6H PRN, Ezenduka, Nkeiruka J, MD   Oral care mouth rinse, 15 mL, Mouth Rinse, 4 times per day, Gonfa, Taye T, MD, 15 mL at 03/04/24 1607   Oral care mouth rinse, 15 mL, Mouth Rinse, PRN, Gonfa, Taye T, MD   polyethylene glycol (MIRALAX  / GLYCOLAX ) packet 17 g, 17 g, Oral, Daily PRN, Ezenduka, Nkeiruka J, MD   [COMPLETED] thiamine  (VITAMIN B1) 500 mg in sodium chloride  0.9 % 50 mL IVPB, 500 mg, Intravenous, Q8H, Last Rate: 110 mL/hr at 02/28/24 0120, 500 mg at 02/28/24 0120 **FOLLOWED BY** [COMPLETED] thiamine  (VITAMIN B1) 250 mg in sodium chloride  0.9 % 50 mL IVPB, 250 mg, Intravenous, Q8H, Last Rate: 105 mL/hr at 02/29/24 2211, 250 mg at 02/29/24 2211 **FOLLOWED BY** thiamine  (VITAMIN B1) tablet 200 mg, 200 mg, Per Tube, Daily, Fobbs, Rodericks T, RPH, 200 mg at 03/04/24 0900  Labs and Diagnostic Imaging   B12 41 RPR negative TSH normal Ammonia less than 13 SPEP was negative ANA was negative  Imaging(Personally reviewed): MRI brain-no acute findings  Assessment   Ernest Campbell is a 73 y.o. male with progressive dementing process, high suspicion for Lewy body dementia, with recurrent episodes of delirium and encephalopathy.  My suspicion that his current presentation is likely multifactorial including prolonged hospitalization, infection with persistent fevers, neuroleptic use on admission.   Following his episode of delirium in December, he had some degree of confusion for quite some time.   I do not know that I have any other specific testing or intervention that is likely to be of benefit to him and would expect gradual improvement over time as his medical status improves.  I suspect that any recovery from his current episode will likely be slow and likely incomplete.  She expressed understanding of this.  Recommendations  I would avoid any type of neuroleptics in this individual I had a long conversation with his wife outlining that  though he did have a period of time where he was making some progress, I had a strong suspicion for an underlying progressive neurodegenerative condition.  In that setting, it is not unusual to have repeated setbacks, from which he may or may not return to his previous baseline, but with the understanding that this is a progressive process.  I encouraged her to think about at what point, he would not want to continue pursuing aggressive treatments and to continue to discuss this with palliative care. Neurology will continue to be available as needed, please call with further questions or concerns ______________________________________________________________________   Signed, Aisha Seals, MD [facility]

## 2024-03-04 NOTE — Consult Note (Signed)
 [facility]  Reason for Consult: Left heel wound with osteomyelitis Referring Physician: Dr. Kathrin Curtistine JONELLE Will is an 73 y.o. male.  HPI: Patient is a 73 year old male currently hospitalized since 02/21/2024 for altered mental status.  Patient was admitted with acute toxic and metabolic encephalopathy.  Past medical history notable for parkinsonism, possible Lewy body dementia, anemia, sacral and left heel decubitus ulcers.   Patient was noted to have left heel wound, subsequently had x-ray raising concern for osteomyelitis with subsequent MRI suggesting osteomyelitis.  Orthopedic surgery was consulted, but family declined surgery and chose nonoperative treatment.  Patient has been started on broad-spectrum antibiotics.  Patient has been seen previously by Dr. Harden during this admission, patient's wife refused amputation.  Patient was then seen by Dr. Barton, also discussed risks and benefits of below-knee amputation, patient's wife remain committed to nonoperative treatment plan.  Plastic surgery was consulted for evaluation.  History is provided entirely by patient's wife today, patient is noncommunicative at this time.  Patient's wife reports that he has seemed more alert today than other days, she reports that he had a hip fracture and was in a SNF for some time where he developed a left heel ulcer around February 2025.  RN at bedside, patient has been receiving Santyl  dressing changes to left heel.  Past Medical History:  Diagnosis Date   Anemia    Dementia (HCC)    Stroke [facility])     Past Surgical History:  Procedure Laterality Date   INTRAMEDULLARY (IM) NAIL INTERTROCHANTERIC Right 09/26/2023   Procedure: INTRAMEDULLARY (IM) NAIL INTERTROCHANTERIC;  Surgeon: Kendal Franky SQUIBB, MD;  Location: [facility] OR;  Service: Orthopedics;  Laterality: Right;   ORIF FEMUR FRACTURE Right 10/24/2023   Procedure: OPEN REDUCTION INTERNAL FIXATION FEMORAL SHAFT FRACTURE;   Surgeon: Kendal Franky SQUIBB, MD;  Location: [facility] OR;  Service: Orthopedics;  Laterality: Right;    Family History  Problem Relation Age of Onset   Dementia Mother    Parkinsonism Father    Stroke Father    Breast cancer Sister     Social History:  reports that he has been smoking cigarettes. He does not have any smokeless tobacco history on file. He reports that he does not drink alcohol and does not use drugs.  Allergies:  Allergies  Allergen Reactions   Fentanyl  And Related Anaphylaxis   Other Other (See Comments)    According to the patient, all opioids cause hallucinations    Prednisone  Other (See Comments)    Hallucinations - Allergic, per MAR   Benadryl  [Diphenhydramine ] Other (See Comments)    hallucinations   Codeine Other (See Comments)    Hallucinations - Allergic, per MAR   Morphine  Other (See Comments)    Hallucinations - Allergic, per MAR   Oxycodone Other (See Comments)    Hallucinations - Allergic, per MAR    Medications: I have reviewed the patient's current medications.  Results for orders placed or performed during the hospital encounter of 02/25/24 (from the past 48 hours)  Glucose, capillary     Status: Abnormal   Collection Time: 03/02/24  4:18 PM  Result Value Ref Range   Glucose-Capillary 140 (H) 70 - 99 mg/dL    Comment: Glucose reference range applies only to samples taken after fasting for at least 8 hours.   Comment 1 Notify RN   Glucose, capillary     Status: Abnormal   Collection Time: 03/02/24  7:32 PM  Result Value Ref Range  Glucose-Capillary 125 (H) 70 - 99 mg/dL    Comment: Glucose reference range applies only to samples taken after fasting for at least 8 hours.   Comment 1 Notify RN    Comment 2 Document in Chart   Glucose, capillary     Status: Abnormal   Collection Time: 03/02/24 11:25 PM  Result Value Ref Range   Glucose-Capillary 139 (H) 70 - 99 mg/dL    Comment: Glucose reference range applies only to samples taken after  fasting for at least 8 hours.   Comment 1 Notify RN    Comment 2 Document in Chart   Glucose, capillary     Status: Abnormal   Collection Time: 03/03/24  4:09 AM  Result Value Ref Range   Glucose-Capillary 116 (H) 70 - 99 mg/dL    Comment: Glucose reference range applies only to samples taken after fasting for at least 8 hours.   Comment 1 Notify RN    Comment 2 Document in Chart   CK     Status: None   Collection Time: 03/03/24  4:49 AM  Result Value Ref Range   Total CK 54 49 - 397 U/L    Comment: Performed at [facility], [address], [place], [place] [address]  Glucose, capillary     Status: Abnormal   Collection Time: 03/03/24  6:27 AM  Result Value Ref Range   Glucose-Capillary 109 (H) 70 - 99 mg/dL    Comment: Glucose reference range applies only to samples taken after fasting for at least 8 hours.   Comment 1 Notify RN    Comment 2 Document in Chart   Glucose, capillary     Status: Abnormal   Collection Time: 03/03/24 11:54 AM  Result Value Ref Range   Glucose-Capillary 139 (H) 70 - 99 mg/dL    Comment: Glucose reference range applies only to samples taken after fasting for at least 8 hours.   Comment 1 Notify RN    Comment 2 Document in Chart   Glucose, capillary     Status: Abnormal   Collection Time: 03/03/24  5:48 PM  Result Value Ref Range   Glucose-Capillary 169 (H) 70 - 99 mg/dL    Comment: Glucose reference range applies only to samples taken after fasting for at least 8 hours.   Comment 1 Notify RN    Comment 2 Document in Chart   Glucose, capillary     Status: Abnormal   Collection Time: 03/03/24 11:40 PM  Result Value Ref Range   Glucose-Capillary 137 (H) 70 - 99 mg/dL    Comment: Glucose reference range applies only to samples taken after fasting for at least 8 hours.   Comment 1 Notify RN    Comment 2 Document in Chart   Glucose, capillary     Status: Abnormal   Collection Time: 03/04/24  6:35 AM  Result Value Ref Range   Glucose-Capillary  139 (H) 70 - 99 mg/dL    Comment: Glucose reference range applies only to samples taken after fasting for at least 8 hours.   Comment 1 Notify RN    Comment 2 Document in Chart   Glucose, capillary     Status: Abnormal   Collection Time: 03/04/24 12:00 PM  Result Value Ref Range   Glucose-Capillary 163 (H) 70 - 99 mg/dL    Comment: Glucose reference range applies only to samples taken after fasting for at least 8 hours.    VAS US  LOWER EXTREMITY VENOUS (DVT)  Result Date: 03/04/2024  Lower Venous DVT Study Patient Name:  DESEAN HEEMSTRA  Date of Exam:   03/03/2024 Medical Rec #: [id number]         Accession #:    [id number] Date of Birth: [birth year]          Patient Gender: M Patient Age:   44 years Exam Location:  [facility] Procedure:      VAS US  LOWER EXTREMITY VENOUS (DVT) Referring Phys: MIGNON GONFA --------------------------------------------------------------------------------  Indications: Fever. Other Indications: Posterior left heel pressure ulcer. Risk Factors: Stroke. Performing Technologist: Ricka Sturdivant-Jones RDMS, RVT  Examination Guidelines: A complete evaluation includes B-mode imaging, spectral Doppler, color Doppler, and power Doppler as needed of all accessible portions of each vessel. Bilateral testing is considered an integral part of a complete examination. Limited examinations for reoccurring indications may be performed as noted. The reflux portion of the exam is performed with the patient in reverse Trendelenburg.  +---------+---------------+---------+-----------+----------+--------------+ RIGHT    CompressibilityPhasicitySpontaneityPropertiesThrombus Aging +---------+---------------+---------+-----------+----------+--------------+ CFV      Full           Yes      Yes                                 +---------+---------------+---------+-----------+----------+--------------+ SFJ      Full                                                         +---------+---------------+---------+-----------+----------+--------------+ FV Prox  Full                                                        +---------+---------------+---------+-----------+----------+--------------+ FV Mid   Full                                                        +---------+---------------+---------+-----------+----------+--------------+ FV DistalFull                                                        +---------+---------------+---------+-----------+----------+--------------+ PFV      Full                                                        +---------+---------------+---------+-----------+----------+--------------+ POP      Full           Yes      Yes                                 +---------+---------------+---------+-----------+----------+--------------+ PTV      Full                                                        +---------+---------------+---------+-----------+----------+--------------+  PERO     Full                                                        +---------+---------------+---------+-----------+----------+--------------+   +---------+---------------+---------+-----------+----------+--------------+ LEFT     CompressibilityPhasicitySpontaneityPropertiesThrombus Aging +---------+---------------+---------+-----------+----------+--------------+ CFV      Full           Yes      Yes                                 +---------+---------------+---------+-----------+----------+--------------+ SFJ      Full                                                        +---------+---------------+---------+-----------+----------+--------------+ FV Prox  Full                                                        +---------+---------------+---------+-----------+----------+--------------+ FV Mid   Full                                                         +---------+---------------+---------+-----------+----------+--------------+ FV DistalFull                                                        +---------+---------------+---------+-----------+----------+--------------+ PFV      Full                                                        +---------+---------------+---------+-----------+----------+--------------+ POP      Full           Yes      Yes                                 +---------+---------------+---------+-----------+----------+--------------+ PTV      Full                                                        +---------+---------------+---------+-----------+----------+--------------+ PERO     Full                                                        +---------+---------------+---------+-----------+----------+--------------+  Summary: BILATERAL: - No evidence of deep vein thrombosis seen in the lower extremities, bilaterally. -No evidence of popliteal cyst, bilaterally.   *See table(s) above for measurements and observations. Electronically signed by Norman Serve on 03/04/2024 at 10:48:36 AM.    Final    VAS US  UPPER EXTREMITY VENOUS DUPLEX Result Date: 03/04/2024 UPPER VENOUS STUDY  Patient Name:  EARLAND REISH  Date of Exam:   03/03/2024 Medical Rec #: [id number]         Accession #:    [id number] Date of Birth: Nov 02, 1950          Patient Gender: M Patient Age:   3 years Exam Location:  [facility] Procedure:      VAS US  UPPER EXTREMITY VENOUS DUPLEX Referring Phys: TRUNG VU --------------------------------------------------------------------------------  Indications: Swelling Limitations: Very limited exam due to patient resisting and not allowing to position extremity as need to imaging. Performing Technologist: Elmarie Lindau, RVT  Examination Guidelines: A complete evaluation includes B-mode imaging, spectral Doppler, color Doppler, and power Doppler as needed of all accessible portions of each  vessel. Bilateral testing is considered an integral part of a complete examination. Limited examinations for reoccurring indications may be performed as noted.  Right Findings: +----------+------------+---------+-----------+----------+-------+ RIGHT     CompressiblePhasicitySpontaneousPropertiesSummary +----------+------------+---------+-----------+----------+-------+ IJV           Full       Yes                                +----------+------------+---------+-----------+----------+-------+ Subclavian               Yes                                +----------+------------+---------+-----------+----------+-------+ Brachial      Full                                          +----------+------------+---------+-----------+----------+-------+ Basilic       Full                                          +----------+------------+---------+-----------+----------+-------+  Left Findings: +----------+------------+---------+-----------+----------+-------+ LEFT      CompressiblePhasicitySpontaneousPropertiesSummary +----------+------------+---------+-----------+----------+-------+ IJV           Full       Yes                                +----------+------------+---------+-----------+----------+-------+ Subclavian               Yes                                +----------+------------+---------+-----------+----------+-------+  Summary:  Right: No evidence of right upper extremity DVT/SVT in areas visualized.  Left: No evidence of left upper extremity DVT in areas visualized.  *See table(s) above for measurements and observations.  Diagnosing physician: Norman Serve Electronically signed by Norman Serve on 03/04/2024 at 10:48:21 AM.    Final    CT ABDOMEN PELVIS W CONTRAST Result Date: 03/03/2024 CLINICAL DATA:  Sepsis with fever EXAM:  CT ABDOMEN AND PELVIS WITH CONTRAST TECHNIQUE: Multidetector CT imaging of the abdomen and pelvis was performed using the  standard protocol following bolus administration of intravenous contrast. RADIATION DOSE REDUCTION: This exam was performed according to the departmental dose-optimization program which includes automated exposure control, adjustment of the mA and/or kV according to patient size and/or use of iterative reconstruction technique. CONTRAST:  75mL OMNIPAQUE  IOHEXOL  350 MG/ML SOLN COMPARISON:  None Available. FINDINGS: Lower chest: There is some atelectasis in both lung bases. Hepatobiliary: No focal liver abnormality is seen. No gallstones, gallbladder wall thickening, or biliary dilatation. Pancreas: Unremarkable. No pancreatic ductal dilatation or surrounding inflammatory changes. Spleen: Normal in size without focal abnormality. Adrenals/Urinary Tract: The bilateral kidneys and adrenal glands are within normal limits. The bladder is decompressed by Foley catheter. Cannot exclude diffuse bladder wall thickening. Stomach/Bowel: Stomach is within normal limits. Enteric tube tip is in the gastric antrum. Appendix is not seen. No evidence of bowel wall thickening, distention, or inflammatory changes. There is sigmoid colon diverticulosis. Vascular/Lymphatic: Aortic atherosclerosis. No enlarged abdominal or pelvic lymph nodes. Reproductive: Prostate is unremarkable. Other: There is no ascites or focal abdominal wall hernia. There is a small focus of subcutaneous air in the lower anterior abdominal wall image 3/77. Musculoskeletal: Right hip screw is present. IMPRESSION: 1. No acute localizing process in the abdomen or pelvis. 2. Bladder is decompressed by Foley catheter. Cannot exclude diffuse bladder wall thickening. Correlate clinically for cystitis. 3. Small focus of subcutaneous air in the lower anterior abdominal wall, possibly related to recent surgery or injection site. 4. Sigmoid colon diverticulosis. Aortic Atherosclerosis (ICD10-I70.0). Electronically Signed   By: Greig Pique M.D.   On: 03/03/2024 16:19     Review of Systems  Unable to perform ROS: Mental status change   Blood pressure (!) 115/53, pulse 93, temperature 98.3 F (36.8 C), temperature source Oral, resp. rate (!) 22, height 5' 5 (1.651 m), weight 36.3 kg, SpO2 98%. Physical Exam Constitutional:      Appearance: He is ill-appearing. He is not toxic-appearing or diaphoretic.  HENT:     Head: Normocephalic and atraumatic.     Left heel ulcer is 1.8 x 1 x 1 cm depth extending to the bone.  There is some fibrinous exudate/necrotic tissue centrally with healthy base of granulation tissue noted on the periphery of the wound bed.  There is no active drainage, no surrounding erythema.  No foul odor is noted.  Assessment/Plan: Left posterior heel pressure ulcer:  Significant improvement in the wound that has been noted since photos on 02/26/2024 with new epithelialization and healthy base granulation tissue present.  Centrally he does still have some fibrinous exudate present and wound is extending down to bone about 1 cm.  Patient is not an ideal surgical candidate at this time, however he has had significant improvement in the wound with local wound care.  I discussed various plans of care with patient's wife, she is agreeable at this time to continue to monitor the wound with local wound care and can consider surgical intervention in the future pending change in his health status.  -Agree with current wound care regimen of Santyl  daily, cover with Mepilex border dressing if possible to assist with extra support.  Continue with Prevalon boots for offloading. - May also benefit from applying Vashe soaked gauze to the area for 5 minutes once per day between dressing changes to assist with bioburden.  -Recommend outpatient follow-up with wound care center for ongoing wound care management.  Discussed with patient that I would notify Dr. Lowery of physical exam findings and update patient and family of any further  recommendations.   Donnice PARAS Alpha Chouinard, PA-C 03/04/2024, 2:27 PM

## 2024-03-04 NOTE — Progress Notes (Signed)
 [facility]  Date of Admission:  02/25/2024     Lines: Peripheral iv's  Abx: 7/28-c doxy 7/28-c augmentin   7/22-28 dapto/ceftriaxone   ASSESSMENT: 73 year old male with past medical history of anemia, possible parkinsonism,, Lewy body dementia, admitted with altered mental status, recently started on Zyprexa  ID engaged as found to have #Fevers in the setting of left calcaneal osteomyelitis and necrotic ulcer MRI showed osteomyelitis of the calcaneus, status orthopedics noted foul-smelling odor from necrotic ulcer of left heel Ortho deemed nonsurgical candidate Family doesn't want amputation Abi mild bilateral lower ext vasculopathy  ------------ 03/02/24 id assessment Ongoing fever now into hd #6. The left heel is rather unreamarkable and no abscess present. 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  Ddx lymphoma   ------------- 03/03/24 id assessment 7/28 ldh normal  7/29 repeat cpk 54 Duplex u/s extremities in process Ongoing fever maybe improving curve though? Last fever 100.5 on 7/28 Changed abx to doxy/augmentin  7/28   Wife thinks he is mentating a little better. Reports he has some pending outpatient pet scan  Again still query if fever related to the foot at all   ---------------- 7/30 id assessment Still fever Ongoing delirium maybe more alert Abd pelv ct no abscess or concerning lesion  Duplex no dvt's   Working dx -- malignancy, less likely related to the foot process with fever, limited viral process. No obvious connective tissue process otherwise     Recommendations:- - flow cytometry -- finish 6 weeks doxy/augmentin  on 9/2 -- id clinic f/u 8/25 @ 2pm -- patient has pet ct per wife report  outpatient and could shed light on process if persistent fever -- no further id w/u needed and can dc once ready per primary team - Standard isolation precautions -- will sign off -- discussed with primary team   Principal Problem:   Altered mental status Active Problems:   AMS (altered mental status)   Toxic metabolic encephalopathy   PAD (peripheral artery disease) (HCC)   Subacute osteomyelitis of left foot (HCC)   Acute metabolic encephalopathy   Pressure injury of skin   Osteomyelitis (HCC)   Fever   Sepsis (HCC)   Allergies  Allergen Reactions   Fentanyl  And Related Anaphylaxis   Other Other (See Comments)    According to the patient, all opioids cause hallucinations    Prednisone  Other (See Comments)    Hallucinations - Allergic, per [facility]   Benadryl  [Diphenhydramine ] Other (See Comments)    hallucinations   Codeine Other (See Comments)    Hallucinations - Allergic, per [facility]   Morphine  Other (See Comments)    Hallucinations - Allergic, per [facility]   Oxycodone Other (See Comments)    Hallucinations - Allergic, per [facility]    Scheduled Meds:  amoxicillin -clavulanate  1 tablet Per Tube Q12H   aspirin   81 mg Per Tube Daily   carbidopa -levodopa   1 tablet Per Tube TID   Chlorhexidine  Gluconate Cloth  6 each Topical Daily   collagenase    Topical Daily   doxazosin   1 mg Per Tube Daily   doxycycline   100 mg Per Tube Q12H   enoxaparin  (LOVENOX ) injection  40 mg Subcutaneous QHS   free water   100 mL Per Tube Q4H   nutrition supplement (  JUVEN)  1 packet Per Tube BID BM   mouth rinse  15 mL Mouth Rinse 4 times per day   thiamine   200 mg Per Tube Daily   Continuous Infusions:  feeding supplement (OSMOLITE 1.5 CAL) 1,000 mL (03/04/24 0843)   PRN Meds:.acetaminophen  **OR** acetaminophen , albuterol , ondansetron  **OR** ondansetron  (ZOFRAN ) IV, mouth rinse, polyethylene glycol   SUBJECTIVE: Ongoing fever No other complaint No n/v/d Abd ct and duplex us  negative for  acute pathology   Review of Systems: ROS All other ROS was negative, except mentioned above     OBJECTIVE: Vitals:   03/04/24 0422 03/04/24 0734 03/04/24 0800 03/04/24 1159  BP: 107/63 123/65  (!) 115/53  Pulse: 74 83  93  Resp: 18 (!) 21  (!) 22  Temp: 99.2 F (37.3 C) 98.3 F (36.8 C)  98.3 F (36.8 C)  TempSrc: Oral Oral  Oral  SpO2: 98% 98%  98%  Weight:   36.3 kg   Height:       Body mass index is 13.31 kg/m.  Physical Exam General/constitutional: chronically ill; eyes open but not focusing/tracking anyone HEENT: Normocephalic, PER, Conj Clear Neck supple CV: rrr no mrg Lungs: clear to auscultation, normal respiratory effort Abd: Soft, Nontender Ext/skin/msk: left heel pressure ulcer small; without fluctuance/purulence Neuro: eyes open; not following command; rigidity in extremities   Lab Results Lab Results  Component Value Date   WBC 8.6 03/02/2024   HGB 9.5 (L) 03/02/2024   HCT 29.2 (L) 03/02/2024   MCV 93.6 03/02/2024   PLT 300 03/02/2024    Lab Results  Component Value Date   CREATININE 0.70 03/01/2024   BUN 25 (H) 03/01/2024   NA 138 03/01/2024   K 4.0 03/01/2024   CL 107 03/01/2024   CO2 22 03/01/2024    Lab Results  Component Value Date   ALT 19 02/25/2024   AST 50 (H) 02/25/2024   ALKPHOS 79 02/25/2024   BILITOT 1.2 02/25/2024      Microbiology: Recent Results (from the past 240 hours)  Resp panel by RT-PCR (RSV, Flu A&B, Covid) Anterior Nasal Swab     Status: None   Collection Time: 02/25/24  3:49 PM   Specimen: Anterior Nasal Swab  Result Value Ref Range Status   SARS Coronavirus 2 by RT PCR NEGATIVE NEGATIVE Final   Influenza A by PCR NEGATIVE NEGATIVE Final   Influenza B by PCR NEGATIVE NEGATIVE Final    Comment: (NOTE) The Xpert Xpress SARS-CoV-2/FLU/RSV plus assay is intended as an aid in the diagnosis of influenza from Nasopharyngeal swab specimens and should not be used as a sole basis for treatment. Nasal washings  and aspirates are unacceptable for Xpert Xpress SARS-CoV-2/FLU/RSV testing.  Fact Sheet for Patients: BloggerCourse.com  Fact Sheet for Healthcare Providers: SeriousBroker.it  This test is not yet approved or cleared by the [place]  FDA and has been authorized for detection and/or diagnosis of SARS-CoV-2 by FDA under an Emergency Use Authorization (EUA). This EUA will remain in effect (meaning this test can be used) for the duration of the COVID-19 declaration under Section 564(b)(1) of the Act, 21 U.S.C. section 360bbb-3(b)(1), unless the authorization is terminated or revoked.     Resp Syncytial Virus by PCR NEGATIVE NEGATIVE Final    Comment: (NOTE) Fact Sheet for Patients: BloggerCourse.com  Fact Sheet for Healthcare Providers: SeriousBroker.it  This test is not yet approved or cleared by the [place]  FDA and has been authorized for detection and/or diagnosis of SARS-CoV-2 by FDA  under an Emergency Use Authorization (EUA). This EUA will remain in effect (meaning this test can be used) for the duration of the COVID-19 declaration under Section 564(b)(1) of the Act, 21 U.S.C. section 360bbb-3(b)(1), unless the authorization is terminated or revoked.  Performed at [facility], [address] [place], [place] [address]   Blood culture (routine x 2)     Status: None   Collection Time: 02/25/24  5:00 PM   Specimen: BLOOD RIGHT HAND  Result Value Ref Range Status   Specimen Description BLOOD RIGHT HAND  Final   Special Requests   Final    BOTTLES DRAWN AEROBIC ONLY Blood Culture results may not be optimal due to an inadequate volume of blood received in culture bottles   Culture   Final    NO GROWTH 5 DAYS Performed at [facility], [address], [place], [place] [address]    Report Status 03/01/2024 FINAL  Final  Blood culture (routine x 2)      Status: None   Collection Time: 02/25/24  5:10 PM   Specimen: BLOOD LEFT HAND  Result Value Ref Range Status   Specimen Description BLOOD LEFT HAND  Final   Special Requests   Final    BOTTLES DRAWN AEROBIC ONLY Blood Culture results may not be optimal due to an inadequate volume of blood received in culture bottles   Culture   Final    NO GROWTH 5 DAYS Performed at [facility], [address], [place], [place] [address]    Report Status 03/01/2024 FINAL  Final  Respiratory (~20 pathogens) panel by PCR     Status: None   Collection Time: 03/02/24 10:39 AM   Specimen: Nasopharyngeal Swab; Respiratory  Result Value Ref Range Status   Adenovirus NOT DETECTED NOT DETECTED Final   Coronavirus 229E NOT DETECTED NOT DETECTED Final    Comment: (NOTE) The Coronavirus on the Respiratory Panel, DOES NOT test for the novel  Coronavirus (2019 nCoV)    Coronavirus HKU1 NOT DETECTED NOT DETECTED Final   Coronavirus NL63 NOT DETECTED NOT DETECTED Final   Coronavirus OC43 NOT DETECTED NOT DETECTED Final   Metapneumovirus NOT DETECTED NOT DETECTED Final   Rhinovirus / Enterovirus NOT DETECTED NOT DETECTED Final   Influenza A NOT DETECTED NOT DETECTED Final   Influenza B NOT DETECTED NOT DETECTED Final   Parainfluenza Virus 1 NOT DETECTED NOT DETECTED Final   Parainfluenza Virus 2 NOT DETECTED NOT DETECTED Final   Parainfluenza Virus 3 NOT DETECTED NOT DETECTED Final   Parainfluenza Virus 4 NOT DETECTED NOT DETECTED Final   Respiratory Syncytial Virus NOT DETECTED NOT DETECTED Final   Bordetella pertussis NOT DETECTED NOT DETECTED Final   Bordetella Parapertussis NOT DETECTED NOT DETECTED Final   Chlamydophila pneumoniae NOT DETECTED NOT DETECTED Final   Mycoplasma pneumoniae NOT DETECTED NOT DETECTED Final    Comment: Performed at [facility], [address] [place], [place] [address]     Serology:   Imaging: If present, new imagings (plain films, ct scans, and mri) have  been personally visualized and interpreted; radiology reports have been reviewed. Decision making incorporated into the Impression / Recommendations.  7/26 chest ct 1. Airway thickening is present especially in both lower lobes with some degree of airway plugging more notable in the left lower lobe, and bandlike regions of volume loss and mild airspace opacity in both lower lobes favoring atelectasis although aspiration pneumonitis is included in the differential  diagnosis. 2. Mitral valve calcification. 3. Left greater than right degenerative glenohumeral arthropathy. 4.  Aortic Atherosclerosis    7/23 mri left heel FINDINGS: Bones/Joint/Cartilage   Abnormal edema and enhancement in the posteroinferior calcaneus as on image 15 series 1043 compatible with osteomyelitis.   Ligaments   Suboptimal characterization due to imaging plane choices. Grossly intact.   Muscles and Tendons   Trace nonspecific edema the quadratus plantae muscle potentially from mild myositis. Mild distal Achilles tendinopathy.   Soft tissues   Deep ulceration along the posteroinferior heel extending to the periosteal margin of the posteroinferior calcaneus on image 14 series 1043.   IMPRESSION: 1. 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 2. Trace nonspecific edema in the quadratus plantae muscle potentially from mild myositis. 3. Mild distal Achilles tendinopathy.    7/27 abi Right: Resting right ankle-brachial index indicates mild right lower  extremity arterial disease. The right toe-brachial index is abnormal.   Left: Resting left ankle-brachial index indicates mild left lower  extremity arterial disease. The left toe-brachial index is abnormal.     7/29 vasc u/s Right:  No evidence of right upper extremity DVT/SVT in areas visualized.    Left:   No evidence of left upper extremity DVT in areas visualized.    7/29 ct abd pelv 1. No acute localizing process in the abdomen or pelvis. 2. Bladder is decompressed by Foley catheter. Cannot exclude diffuse bladder wall thickening. Correlate clinically for cystitis. 3. Small focus of subcutaneous air in the lower anterior abdominal wall, possibly related to recent surgery or injection site. 4. Sigmoid colon diverticulosis.    Constance ONEIDA Passer, MD [facility] [phone] pager    03/04/2024, 2:54 PM

## 2024-03-04 NOTE — Progress Notes (Signed)
 Speech Language Pathology Treatment: Dysphagia  Patient Details Name: Ernest Campbell MRN: [id number] DOB: 1950-09-21 Today's Date: 03/04/2024 Time: 8376-8365 SLP Time Calculation (min) (ACUTE ONLY): 11 min  Assessment / Plan / Recommendation Clinical Impression  While pt is seemingly more alert today, his spouse says he has not returned to baseline. She states PTA he was feeding himself up to six meals a day consisting of regular solids and thin liquids. He cannot initiate sips from the straw but did take very small volumes of thin liquids via siphoned straw sips or cup sips without signs clinically concerning for aspiration. Oral transit was prompt today with pudding, only requiring Min cueing to swallow x2 as needed to clear minimal oral residuals. After discussion with pt's spouse and MD, will try full liquid diet in hopes that automaticity of having POs will improve mentation. Pt will need full supervision for all PO intake with strict adherence to aspiration precautions only when pt is fully awake/alert. Provide oral care prior to POs and place dentures as able. SLP will f/u to assess readiness to advance as mentation allows.    HPI HPI: Ernest Campbell is a 73 yo male presenting to ED 7/22 with AMS since 7/18 after starting Zyprexa . Admitted with acute toxic and metabolic encephalopathy, lactic acidosis, and concern for L heel osteomyelitis. MRI Brain negative. Most recently seen by SLP April 2025 with deficits related to oral holding and intermittent dry coughing with thin liquids but was ultimately discharged on Dys 2 diet with thin liquids. Previous swallow evaluation December 2024 revealed functional performance but with concern for adequate intake in light of mentation. CT Chest 7/26 shows airway thickening in both lower lobes with some degree of airway plugging more notable in the L lower lobe, and bandlike regions of volume loss and mild airspace opacity in both lower lobes favoring  atelectasis although aspiration pneumonitis is included in the differential diagnosis. 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, anemia, sacral and L heel decubitus      SLP Plan  Continue with current plan of care          Recommendations                     Oral care QID;Oral care prior to ice chip/H20   Frequent or constant Supervision/Assistance Dysphagia, unspecified (R13.10)     Continue with current plan of care     Damien Blumenthal, M.A., CCC-SLP Speech Language Pathology, Acute Rehabilitation Services  Secure Chat preferred [phone]   03/04/2024, 5:15 PM

## 2024-03-04 NOTE — Plan of Care (Signed)
  Problem: Clinical Measurements: Goal: Ability to maintain clinical measurements within normal limits will improve Outcome: Progressing Goal: Will remain free from infection Outcome: Progressing Goal: Diagnostic test results will improve Outcome: Progressing Goal: Respiratory complications will improve Outcome: Progressing Goal: Cardiovascular complication will be avoided Outcome: Progressing   Problem: Nutrition: Goal: Adequate nutrition will be maintained Outcome: Progressing   Problem: Elimination: Goal: Will not experience complications related to bowel motility Outcome: Progressing Goal: Will not experience complications related to urinary retention Outcome: Progressing   Problem: Safety: Goal: Ability to remain free from injury will improve Outcome: Progressing   Problem: Pain Managment: Goal: General experience of comfort will improve and/or be controlled Outcome: Progressing   Problem: Skin Integrity: Goal: Risk for impaired skin integrity will decrease Outcome: Progressing

## 2024-03-05 DIAGNOSIS — R4182 Altered mental status, unspecified: Secondary | ICD-10-CM | POA: Diagnosis not present

## 2024-03-05 DIAGNOSIS — Z7189 Other specified counseling: Secondary | ICD-10-CM | POA: Diagnosis not present

## 2024-03-05 DIAGNOSIS — G9341 Metabolic encephalopathy: Secondary | ICD-10-CM | POA: Diagnosis not present

## 2024-03-05 DIAGNOSIS — M869 Osteomyelitis, unspecified: Secondary | ICD-10-CM | POA: Diagnosis not present

## 2024-03-05 DIAGNOSIS — Z515 Encounter for palliative care: Secondary | ICD-10-CM | POA: Diagnosis not present

## 2024-03-05 LAB — COMPREHENSIVE METABOLIC PANEL WITH GFR
ALT: 33 U/L (ref 0–44)
AST: 40 U/L (ref 15–41)
Albumin: 2.4 g/dL — ABNORMAL LOW (ref 3.5–5.0)
Alkaline Phosphatase: 91 U/L (ref 38–126)
Anion gap: 7 (ref 5–15)
BUN: 33 mg/dL — ABNORMAL HIGH (ref 8–23)
CO2: 25 mmol/L (ref 22–32)
Calcium: 8.5 mg/dL — ABNORMAL LOW (ref 8.9–10.3)
Chloride: 103 mmol/L (ref 98–111)
Creatinine, Ser: 0.83 mg/dL (ref 0.61–1.24)
GFR, Estimated: >60 mL/min (ref >60)
Glucose, Bld: 141 mg/dL — ABNORMAL HIGH (ref 70–99)
Potassium: 4.5 mmol/L (ref 3.5–5.1)
Sodium: 135 mmol/L (ref 135–145)
Total Bilirubin: 0.3 mg/dL (ref 0.0–1.2)
Total Protein: 6 g/dL — ABNORMAL LOW (ref 6.5–8.1)

## 2024-03-05 LAB — CBC
HCT: 32.4 % — ABNORMAL LOW (ref 39.0–52.0)
Hemoglobin: 10.7 g/dL — ABNORMAL LOW (ref 13.0–17.0)
MCH: 31.2 pg (ref 26.0–34.0)
MCHC: 33 g/dL (ref 30.0–36.0)
MCV: 94.5 fL (ref 80.0–100.0)
Platelets: 437 K/uL — ABNORMAL HIGH (ref 150–400)
RBC: 3.43 MIL/uL — ABNORMAL LOW (ref 4.22–5.81)
RDW: 13.4 % (ref 11.5–15.5)
WBC: 9.2 K/uL (ref 4.0–10.5)
nRBC: 0 % (ref 0.0–0.2)

## 2024-03-05 LAB — GLUCOSE, CAPILLARY
Glucose-Capillary: 132 mg/dL — ABNORMAL HIGH (ref 70–99)
Glucose-Capillary: 148 mg/dL — ABNORMAL HIGH (ref 70–99)
Glucose-Capillary: 149 mg/dL — ABNORMAL HIGH (ref 70–99)
Glucose-Capillary: 153 mg/dL — ABNORMAL HIGH (ref 70–99)
Glucose-Capillary: 155 mg/dL — ABNORMAL HIGH (ref 70–99)

## 2024-03-05 LAB — MAGNESIUM: Magnesium: 2.1 mg/dL (ref 1.7–2.4)

## 2024-03-05 NOTE — Progress Notes (Signed)
 Patient ID: USHER HEDBERG, male   DOB: [birth year], 73 y.o.   MRN: [id number]    Progress Note from the Palliative Medicine Team at [facility]   Patient Name: Ernest Campbell        Date: 03/05/2024 DOB: 1951-07-06  Age: 73 y.o. MRN#: [id number] Attending Physician: Verdene Purchase, MD Primary Care Physician: Pcp, No Admit Date: 02/25/2024   Reason for Consultation/Follow-up   Establishing Goals of Care   HPI/ Brief Hospital Review  73 year old M with PMH of parkinsonism, possible Lewy body dementia, anemia, sacral and left heel decubitus presenting with altered mental status since 02/21/2024 after he started Zyprexa  by his neurologist, and admitted with acute toxic and metabolic encephalopathy.  Workup in ED significant for lactic acidosis.  CT head and MRI brain without acute finding.  MRI of left heel significant for  osteomyelitis.  Orthopedic surgery consulted but family declined surgery and chose nonoperative treatment.  Started on broad-spectrum antibiotics.  ID and palliative medicine consulted. Neurology has been consulted   Cortrack placed on 7/25 and started on TF.   Family face treatment option decisions, advanced directive decisions and anticipatory care needs.   Subjective  Extensive chart review has been completed prior to meeting with patient/family  including labs, vital signs, imaging, progress/consult notes, orders, medications and available advance directive documents.   This NP assessed patient at the bedside as a follow up for palliative medicine needs and emotional support.  Wife and niece at bedside.  Patient more interactive today than my previous visits with him. Eyes remain open. Attempting to speak however no intelligible words during my time at bedside.   Full liquid diet has been ordered however family reports this has not been administered yet.    He is tolerating tube feed at goal rate.  Created space and opportunity for wife to explore thoughts  and feelings regarding current medical situation.  This has been a long difficult journey for her and her husband over the last 6 months.  We review recent conversations with neurology and hospitalist - she has understanding of situation. No questions or concerns. She appreciates Dr. Luis discussion with her regarding expectations moving forward.    Ongoing education regarding current medical situation.  Wife understands the seriousness of patient's current medical situation.  Wants to continue current plan of care, allow time for outcomes. Remains hopeful for improvement from current status.   Wife is open to available medical interventions to prolong life.  She remains hopeful for improvement.  Ultimately her hope is that her husband could return home.  Education offered today regarding  the importance of continued conversation with family and their  medical providers regarding overall plan of care and treatment options,  ensuring decisions are within the context of the patients values and GOCs.  Questions and concerns addressed   Discussed with primary team and nursing staff  PMT will continue to support holistically   Time: 40 minutes  Detailed review of medical records ( labs, imaging, vital signs), medically appropriate exam ( MS, skin, cardiac,  resp)   discussed with treatment team, counseling and education to patient, family, staff, documenting clinical information, medication management, coordination of care    Bosco Paparella Jama Barnacle, DNP, [facility] Palliative Medicine Team Team Phone # [phone]  Pager # [phone]

## 2024-03-05 NOTE — TOC Progression Note (Signed)
 Transition of Care [facility]) - Progression Note    Patient Details  Name: Ernest Campbell MRN: [id number] Date of Birth: 1951/07/29  [facility] CM/SW Contact  [name], RN Phone Number: 03/05/2024, 2:28 PM  Clinical Narrative:     Continues with IV abx and cortrak.  IP Care management following.  Expected Discharge Plan: Home w Home Health Services                 Expected Discharge Plan and Services                                               Social Drivers of Health (SDOH) Interventions SDOH Screenings   Food Insecurity: Patient Unable To Answer (02/27/2024)  Housing: Unknown (02/27/2024)  Transportation Needs: Patient Unable To Answer (02/27/2024)  Utilities: Patient Unable To Answer (02/27/2024)  Financial Resource Strain: Low Risk  (10/20/2022)   Received from [facility]  Physical Activity: Unknown (10/20/2022)   Received from [facility]  Social Connections: Unknown (02/27/2024)  Stress: Patient Declined (10/20/2022)   Received from [facility]  Tobacco Use: High Risk (02/25/2024)    Readmission Risk Interventions    11/08/2023    1:14 PM  Readmission Risk Prevention Plan  Transportation Screening   Medication Review (RN Care Manager)   PCP or Specialist appointment within 3-5 days of discharge   PCP/Specialist Appt Not Complete comments   HRI or Home Care Consult   SW Recovery Care/Counseling Consult   Palliative Care Screening   Skilled Nursing Facility      Information is confidential and restricted. Go to Review Flowsheets to unlock data.

## 2024-03-05 NOTE — Progress Notes (Addendum)
 PROGRESS NOTE  Ernest Campbell FMW:[id number] DOB: [birth year]   PCP: Pcp, No  Patient is from: Home.  DOA: 02/25/2024 LOS: 7   Brief Narrative / Interim history: 73 year old M with PMH of parkinsonism, possible Lewy body dementia, anemia, sacral and left heel decubitus presenting with altered mental status since 02/21/2024 after he started Zyprexa  by his neurologist, and admitted with acute toxic and metabolic encephalopathy.  Workup in ED significant for lactic acidosis.  CT head and MRI brain without acute finding.  Left heel x-ray raise concern for osteomyelitis.  Neurology consulted. The next day, MRI of left heel suggested osteomyelitis.  Orthopedic surgery consulted but family declined surgery and chose nonoperative treatment.  Started on broad-spectrum antibiotics.  ID and palliative medicine consulted. Cortrack placed on 7/25 and started on TF.    Subjective: Patient lying on the bed with his eyes open.  Does not really communicate.  No family at bedside this morning.  Objective: Vitals:   03/04/24 1944 03/05/24 0003 03/05/24 0413 03/05/24 0732  BP: 121/62 (!) 98/46 128/60 (!) 120/57  Pulse: 88 90 89 88  Resp: (!) 22 16 18  (!) 22  Temp: 98.3 F (36.8 C) 98.9 F (37.2 C) 98.6 F (37 C) 98.4 F (36.9 C)  TempSrc: Oral Oral Oral Oral  SpO2: 100% 98% 97% 97%  Weight:      Height:        Examination:  General appearance: Eyes open but does not communicate. Nasogastric tube noted. Resp: Clear to auscultation bilaterally.  Normal effort Cardio: S1-S2 is normal regular.  No S3-S4.  No rubs murmurs or bruit GI: Abdomen is soft.  Nontender nondistended.  Bowel sounds are present normal.  No masses organomegaly   Consultants:  Neurology Orthopedic surgery Infectious disease Palliative medicine  Procedures: None  Microbiology summarized: COVID-19, influenza and RSV PCR nonreactive Blood cultures NGTD  Assessment and plan:  Acute toxic and metabolic  encephalopathy:  Patient with possible underlying Lewy body dementia.  Temporal association of mental status change with recent initiation of Zyprexa . Was given one dose of xanax  for a PET scan but was not on it on a regular basis.  CT head and MRI brain without acute finding.  EEG with moderate diffuse encephalopathy but no seizure or epileptiform discharge. UA does not suggest UTI.   Ammonia, B12, RPR, VBG and TSH unrevealing. -Completed trial of high-dose thiamine  on 7/27. -Cortrack placed and tube feed started on 7/25.  Tolerating TF at goal rate. Patient has not shown any improvement in the last 1 week.  Discussed with patient's wife and niece yesterday who expressed a lot of frustration.  So neurology was asked to reevaluate patient yesterday.  They have discussed in detail with the patient's family yesterday evening.  No further testing at this time.  Continue to wait on improvement.  According to neurology notes returned back to his baseline may not happen. Palliative care is following as well.  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  CRP elevated to 14.4.  ESR 39. Patient's wife refused amputation but requested ABI which showed mild disease.  Ongoing fever. -Initially evaluated by Dr. Harden.  -Wife requesting second opinion on 7/28.  Evaluated by Dr. Barton who recommends consulting plastics.  Seen by plastics who recommends continuing wound care and outpatient follow-up. ID was also consulted.  Patient has been on various different antibiotics including ceftriaxone  daptomycin  Augmentin  and doxycycline .  Currently on Augmentin   and doxycycline .  ID will follow patient in the outpatient setting.  ID recommends continuing antibiotics until 04/07/2024. Due to persistent fever patient underwent CT scan of the abdomen pelvis which did not show any acute findings. Doppler studies without any DVT. Afebrile in the last 24  hours.  WBC normal.  Continue to monitor.  Recurrent fever:  See above.  Fever of unknown origin.  Workup unremarkable so far including CT of the abdomen pelvis and Doppler studies.  No fever last 24 hours.  Acute urinary retention:  Patient's wife reports history of enlarged prostate.  No prior abdominal imaging.  He had multiple I&O caths.  Initially patient's wife declined Foley but then subsequently agreed. -Started Cardura  1 mg daily per tube on 7/27.  -Continue Foley catheter 7/26>> Voiding trial once he is little bit more stable.  Goal of care discussion:  Palliative care is following.  Patient's wife has gone back and forth DNR.  Currently full code.    Normocytic anemia:  H&H seems to be at baseline.    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  Parkinsonism/possible Lewy body dementia:  Outpatient follow-up  Mild bilateral lower extremity PAD:  LDL 49. - Low-dose aspirin  per tube  Hypokalemia/hypomagnesemia Continue to monitor and supplement as indicated.  Hyponatremia: Resolved.  Severe malnutrition/failure to thrive Body mass index is 13.31 kg/m. Nutrition Problem: Severe Malnutrition Etiology: social / environmental circumstances (frequent hospitalizations and SNF stay) Signs/Symptoms: severe muscle depletion, severe fat depletion, percent weight loss (14% x 6 months) Percent weight loss: 14 % Interventions: Juven, MVI, Prostat, Tube feeding Continue with tube feedings.  Await mentation to improve so that oral feeds can be attempted.  DVT prophylaxis: Lovenox  Code Status: Full code Disposition: To be determined.  Family not interested in SNF.   Sch Meds:  Scheduled Meds:  amoxicillin -clavulanate  1 tablet Per Tube Q12H   aspirin   81 mg Per Tube Daily   carbidopa -levodopa   1 tablet Per Tube TID   Chlorhexidine  Gluconate Cloth  6 each Topical Daily   collagenase    Topical Daily   doxazosin   1 mg Per Tube Daily    doxycycline   100 mg Per Tube Q12H   enoxaparin  (LOVENOX ) injection  40 mg Subcutaneous QHS   free water   100 mL Per Tube Q4H   nutrition supplement (JUVEN)  1 packet Per Tube BID BM   mouth rinse  15 mL Mouth Rinse 4 times per day   thiamine   200 mg Per Tube Daily   Continuous Infusions:  feeding supplement (OSMOLITE 1.5 CAL) 1,000 mL (03/04/24 2028)   PRN Meds:.acetaminophen  **OR** acetaminophen , albuterol , ondansetron  **OR** ondansetron  (ZOFRAN ) IV, mouth rinse, polyethylene glycol  Antimicrobials: Anti-infectives (From admission, onward)    Start     Dose/Rate Route Frequency Ordered Stop   03/02/24 1130  doxycycline  (VIBRA -TABS) tablet 100 mg        100 mg Per Tube Every 12 hours 03/02/24 1041 04/08/24 2359   03/02/24 1130  amoxicillin -clavulanate (AUGMENTIN ) 875-125 MG per tablet 1 tablet        1 tablet Per Tube Every 12 hours 03/02/24 1041 04/08/24 2359   02/27/24 1400  cefTRIAXone  (ROCEPHIN ) 2 g in sodium chloride  0.9 % 100 mL IVPB  Status:  Discontinued        2 g 200 mL/hr over 30 Minutes Intravenous Every 24 hours 02/27/24 1247 03/02/24 1041   02/27/24 1400  DAPTOmycin  (CUBICIN ) IVPB 500 mg/50mL premix  Status:  Discontinued        500 mg 100 mL/hr over 30 Minutes Intravenous Daily 02/27/24 1247 03/02/24 1041        CBC: Recent Labs  Lab 02/28/24 0531 02/29/24 0315 03/01/24 0335 03/02/24 0459 03/05/24 0356  WBC 10.1 7.9 6.7 8.6 9.2  HGB 11.1* 9.9* 8.9* 9.5* 10.7*  HCT 33.8* 30.1* 26.6* 29.2* 32.4*  MCV 96.3 95.0 94.3 93.6 94.5  PLT 204 261 260 300 437*   BMP &GFR Recent Labs  Lab 02/28/24 0531 02/29/24 0315 03/01/24 0335 03/02/24 0459 03/05/24 0356  NA 146* 142 138  --  135  K 3.8 3.0* 4.0  --  4.5  CL 114* 109 107  --  103  CO2 21* 22 22  --  25  GLUCOSE 117* 150* 153*  --  141*  BUN 23 24* 25*  --  33*  CREATININE 0.89 0.72 0.70  --  0.83  CALCIUM  8.8* 8.5* 8.1*  --  8.5*  MG 2.1 1.7 1.6* 2.0 2.1  PHOS 2.5 2.8 2.7 3.0  --    Estimated  Creatinine Clearance: 42.5 mL/min (by C-G formula based on SCr of 0.83 mg/dL).  Liver & Pancreas: Recent Labs  Lab 02/28/24 0531 03/05/24 0356  AST  --  40  ALT  --  33  ALKPHOS  --  91  BILITOT  --  0.3  PROT  --  6.0*  ALBUMIN 2.4* 2.4*     Diabetic: Recent Labs  Lab 03/04/24 0635 03/04/24 1200 03/04/24 1910 03/05/24 0003 03/05/24 0611  GLUCAP 139* 163* 131* 155* 149*   Cardiac Enzymes: Recent Labs  Lab 02/28/24 0531 03/03/24 0449  CKTOTAL 264 54    Microbiology: Recent Results (from the past 240 hours)  Resp panel by RT-PCR (RSV, Flu A&B, Covid) Anterior Nasal Swab     Status: None   Collection Time: 02/25/24  3:49 PM   Specimen: Anterior Nasal Swab  Result Value Ref Range Status   SARS Coronavirus 2 by RT PCR NEGATIVE NEGATIVE Final   Influenza A by PCR NEGATIVE NEGATIVE Final   Influenza B by PCR NEGATIVE NEGATIVE Final    Comment: (NOTE) The Xpert Xpress SARS-CoV-2/FLU/RSV plus assay is intended as an aid in the diagnosis of influenza from Nasopharyngeal swab specimens and should not be used as a sole basis for treatment. Nasal washings and aspirates are unacceptable for Xpert Xpress SARS-CoV-2/FLU/RSV testing.  Fact Sheet for Patients: BloggerCourse.com  Fact Sheet for Healthcare Providers: SeriousBroker.it  This test is not yet approved or cleared by the [place]  FDA and has been authorized for detection and/or diagnosis of SARS-CoV-2 by FDA under an Emergency Use Authorization (EUA). This EUA will remain in effect (meaning this test can be used) for the duration of the COVID-19 declaration under Section 564(b)(1) of the Act, 21 U.S.C. section 360bbb-3(b)(1), unless the authorization is terminated or revoked.     Resp Syncytial Virus by PCR NEGATIVE NEGATIVE Final    Comment: (NOTE) Fact Sheet for Patients: BloggerCourse.com  Fact Sheet for Healthcare  Providers: SeriousBroker.it  This test is not yet approved or cleared by the [place]  FDA and has been authorized for detection and/or diagnosis of SARS-CoV-2 by FDA under an Emergency Use Authorization (EUA). This EUA will remain in effect (meaning this test can be used) for the duration of the COVID-19 declaration under Section 564(b)(1) of the Act, 21 U.S.C. section 360bbb-3(b)(1), unless the authorization is terminated or revoked.  Performed at [facility], [address], [place], [place] [address]   Blood culture (routine x 2)     Status: None   Collection Time: 02/25/24  5:00 PM   Specimen: BLOOD RIGHT HAND  Result Value Ref Range Status   Specimen Description BLOOD RIGHT HAND  Final   Special Requests   Final    BOTTLES DRAWN AEROBIC ONLY Blood Culture results may not be optimal due to an inadequate volume of blood received in culture bottles   Culture   Final    NO GROWTH 5 DAYS Performed at [facility], [address], [place], [place] [address]    Report Status 03/01/2024 FINAL  Final  Blood culture (routine x 2)     Status: None   Collection Time: 02/25/24  5:10 PM   Specimen: BLOOD LEFT HAND  Result Value Ref Range Status   Specimen Description BLOOD LEFT HAND  Final   Special Requests   Final    BOTTLES DRAWN AEROBIC ONLY Blood Culture results may not be optimal due to an inadequate volume of blood received in culture bottles   Culture   Final    NO GROWTH 5 DAYS Performed at [facility], [address], [place], [place] [address]    Report Status 03/01/2024 FINAL  Final  Respiratory (~20 pathogens) panel by PCR     Status: None   Collection Time: 03/02/24 10:39 AM   Specimen: Nasopharyngeal Swab; Respiratory  Result Value Ref Range Status   Adenovirus NOT DETECTED NOT DETECTED Final   Coronavirus 229E NOT DETECTED NOT DETECTED Final    Comment: (NOTE) The Coronavirus on the Respiratory Panel, DOES NOT test  for the novel  Coronavirus (2019 nCoV)    Coronavirus HKU1 NOT DETECTED NOT DETECTED Final   Coronavirus NL63 NOT DETECTED NOT DETECTED Final   Coronavirus OC43 NOT DETECTED NOT DETECTED Final   Metapneumovirus NOT DETECTED NOT DETECTED Final   Rhinovirus / Enterovirus NOT DETECTED NOT DETECTED Final   Influenza A NOT DETECTED NOT DETECTED Final   Influenza B NOT DETECTED NOT DETECTED Final   Parainfluenza Virus 1 NOT DETECTED NOT DETECTED Final   Parainfluenza Virus 2 NOT DETECTED NOT DETECTED Final   Parainfluenza Virus 3 NOT DETECTED NOT DETECTED Final   Parainfluenza Virus 4 NOT DETECTED NOT DETECTED Final   Respiratory Syncytial Virus NOT DETECTED NOT DETECTED Final   Bordetella pertussis NOT DETECTED NOT DETECTED Final   Bordetella Parapertussis NOT DETECTED NOT DETECTED Final   Chlamydophila pneumoniae NOT DETECTED NOT DETECTED Final   Mycoplasma pneumoniae NOT DETECTED NOT DETECTED Final    Comment: Performed at [facility], [address] [place], [place] [address]    Radiology Studies: No results found.   Magdalena Skilton  [facility]  If 7PM-7AM, please contact night-coverage [email or web address] 03/05/2024, 10:34 AM

## 2024-03-05 NOTE — Progress Notes (Signed)
 Speech Language Pathology Treatment: Dysphagia  Patient Details Name: Ernest Campbell MRN: [id number] DOB: 14-Apr-1951 Today's Date: 03/05/2024 Time: 8298-8290 SLP Time Calculation (min) (ACUTE ONLY): 8 min  Assessment / Plan / Recommendation Clinical Impression  Pt presents similarly to previous date. His initiation of labial seal and oral transit are delayed but present. He cannot drink from a straw but his wife states he would not normally use straws at home. Significant anterior spillage occurs when given assistance to take cup sips. Recommend continuing full liquid diet per MD discretion. SLP will f/u to assess potential to advance as mentation allows.   HPI HPI: Ernest Campbell is a 73 yo male presenting to ED 7/22 with AMS since 7/18 after starting Zyprexa . Admitted with acute toxic and metabolic encephalopathy, lactic acidosis, and concern for L heel osteomyelitis. MRI Brain negative. Most recently seen by SLP April 2025 with deficits related to oral holding and intermittent dry coughing with thin liquids but was ultimately discharged on Dys 2 diet with thin liquids. Previous swallow evaluation December 2024 revealed functional performance but with concern for adequate intake in light of mentation. CT Chest 7/26 shows airway thickening in both lower lobes with some degree of airway plugging more notable in the L lower lobe, and bandlike regions of volume loss and mild airspace opacity in both lower lobes favoring atelectasis although aspiration pneumonitis is included in the differential diagnosis. 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, anemia, sacral and L heel decubitus      SLP Plan  Continue with current plan of care          Recommendations  Diet recommendations: Thin liquid Liquids provided via: Teaspoon;Cup;Straw Medication Administration: Via alternative means Supervision: Staff to assist with self feeding;Full supervision/cueing for compensatory  strategies Compensations: Minimize environmental distractions;Slow rate;Small sips/bites;Monitor for anterior loss Postural Changes and/or Swallow Maneuvers: Seated upright 90 degrees;Upright 30-60 min after meal                  Oral care QID;Oral care prior to ice chip/H20   Frequent or constant Supervision/Assistance Dysphagia, unspecified (R13.10)     Continue with current plan of care     Damien Blumenthal, M.A., CCC-SLP Speech Language Pathology, Acute Rehabilitation Services  Secure Chat preferred [phone]   03/05/2024, 5:11 PM

## 2024-03-06 DIAGNOSIS — G9341 Metabolic encephalopathy: Secondary | ICD-10-CM | POA: Diagnosis not present

## 2024-03-06 DIAGNOSIS — M869 Osteomyelitis, unspecified: Secondary | ICD-10-CM | POA: Diagnosis not present

## 2024-03-06 DIAGNOSIS — R4182 Altered mental status, unspecified: Secondary | ICD-10-CM | POA: Diagnosis not present

## 2024-03-06 LAB — GLUCOSE, CAPILLARY
Glucose-Capillary: 123 mg/dL — ABNORMAL HIGH (ref 70–99)
Glucose-Capillary: 135 mg/dL — ABNORMAL HIGH (ref 70–99)
Glucose-Capillary: 156 mg/dL — ABNORMAL HIGH (ref 70–99)
Glucose-Capillary: 125 mg/dL — ABNORMAL HIGH (ref 70–99)

## 2024-03-06 LAB — SURGICAL PATHOLOGY

## 2024-03-06 MED ORDER — KETOROLAC TROMETHAMINE 15 MG/ML IJ SOLN
15.0000 mg | Freq: Once | INTRAMUSCULAR | Status: AC
  Administered 2024-03-06: 15 mg via INTRAVENOUS
  Filled 2024-03-06: qty 1

## 2024-03-06 NOTE — Progress Notes (Addendum)
 PROGRESS NOTE  Ernest Campbell FMW:[id number] DOB: 05-23-1951   PCP: Pcp, No  Patient is from: Home.  DOA: 02/25/2024 LOS: 8   Brief Narrative / Interim history: 73 year old M with PMH of parkinsonism, possible Lewy body dementia, anemia, sacral and left heel decubitus presenting with altered mental status since 02/21/2024 after he started Zyprexa  by his neurologist, and admitted with acute toxic and metabolic encephalopathy.  Workup in ED significant for lactic acidosis.  CT head and MRI brain without acute finding.  Left heel x-ray raise concern for osteomyelitis.  Neurology consulted. The next day, MRI of left heel suggested osteomyelitis.  Orthopedic surgery consulted but family declined surgery and chose nonoperative treatment.  Started on broad-spectrum antibiotics.  ID and palliative medicine consulted. Cortrack placed on 7/25 and started on TF.    Subjective: Patient was getting sponge bath on the bed.  He was not very responsive.  Eyes are open.  He does look at me but does not communicate.  .  Objective: Vitals:   03/05/24 1921 03/05/24 2323 03/06/24 0307 03/06/24 0727  BP: (!) 124/47 120/65 (!) 103/52 (!) 119/56  Pulse: 93 95 87 82  Resp: 18 18 18  (!) 21  Temp: (!) 100.5 F (38.1 C) 99.7 F (37.6 C) 98.9 F (37.2 C) 98.5 F (36.9 C)  TempSrc: Oral Oral Oral Oral  SpO2: 97% 99% 97% 99%  Weight:      Height:        Examination:  General appearance: Eyes are open but does not communicate. Resp: Clear to auscultation bilaterally.  Normal effort Cardio: S1-S2 is normal regular.  No S3-S4.  No rubs murmurs or bruit GI: Abdomen is soft.  Nontender nondistended.  Bowel sounds are present normal.  No masses organomegaly   Consultants:  Neurology Orthopedic surgery Infectious disease Palliative medicine  Procedures: None  Microbiology summarized: COVID-19, influenza and RSV PCR nonreactive Blood cultures NGTD  Assessment and plan:  Acute toxic and metabolic  encephalopathy:  Patient with possible underlying Lewy body dementia.  Temporal association of mental status change with recent initiation of Zyprexa .  Was given one dose of xanax  for a PET scan but was not on it on a regular basis.   CT head and MRI brain without acute finding.  EEG with moderate diffuse encephalopathy but no seizure or epileptiform discharge. UA does not suggest UTI.   Ammonia, B12, RPR, VBG and TSH unrevealing. Completed trial of high-dose thiamine  on 7/27. Patient has not shown any improvement in the last 1 week.  Neurology was reconsulted at family's request.  They saw the patient and have discussed in detail with patient's family.  According to neurology notes returned back to his baseline may not happen.  They suspect underlying progressive neurodegenerative condition.  No further testing. Prognosis remains guarded. Palliative care is following as well.  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  CRP elevated to 14.4.  ESR 39. Patient's wife refused amputation but requested ABI which showed mild disease.  Ongoing fever. -Initially evaluated by Dr. Harden.  -Wife requesting second opinion on 7/28.  Evaluated by Dr. Barton who recommends consulting plastics.  Seen by plastics who recommends continuing wound care and outpatient follow-up. ID was also consulted.  Patient has been on various different antibiotics including ceftriaxone  daptomycin  Augmentin  and doxycycline .   Currently on Augmentin  and doxycycline .  ID will follow patient in the outpatient setting.  ID recommends continuing antibiotics until  04/07/2024. Due to persistent fever patient underwent CT scan of the abdomen pelvis which did not show any acute findings. Doppler studies without any DVT. Occasional low-grade fever noted.  Continue to monitor.  Recurrent fever:  See above.  Fever of unknown origin.  Workup unremarkable so far  including CT of the abdomen pelvis and Doppler studies.    Acute urinary retention:  Patient's wife reports history of enlarged prostate.  No prior abdominal imaging.  He had multiple I&O caths.  Initially patient's wife declined Foley but then subsequently agreed. -Started Cardura  1 mg daily per tube on 7/27.  -Continue Foley catheter 7/26>> Voiding trial once his activity level has improved.  Goal of care discussion:  Palliative care is following.  Patient's wife has gone back and forth DNR.  Currently full code.    Normocytic anemia:  H&H seems to be at baseline.    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  Parkinsonism/possible Lewy body dementia:  Outpatient follow-up  Mild bilateral lower extremity PAD:  LDL 49. Low-dose aspirin  per tube  Hypokalemia/hypomagnesemia Continue to monitor and supplement as indicated.  Hyponatremia: Resolved.  Severe malnutrition/failure to thrive Body mass index is 13.31 kg/m. Nutrition Problem: Severe Malnutrition Etiology: social / environmental circumstances (frequent hospitalizations and SNF stay) Signs/Symptoms: severe muscle depletion, severe fat depletion, percent weight loss (14% x 6 months) Percent weight loss: 14 % Interventions: Juven, MVI, Prostat, Tube feeding Continue with tube feedings.  Await mentation to improve so that oral feeds can be attempted.  DVT prophylaxis: Lovenox  Code Status: Full code Disposition: To be determined.     Sch Meds:  Scheduled Meds:  amoxicillin -clavulanate  1 tablet Per Tube Q12H   aspirin   81 mg Per Tube Daily   carbidopa -levodopa   1 tablet Per Tube TID   Chlorhexidine  Gluconate Cloth  6 each Topical Daily   collagenase    Topical Daily   doxazosin   1 mg Per Tube Daily   doxycycline   100 mg Per Tube Q12H   enoxaparin  (LOVENOX ) injection  40 mg Subcutaneous QHS   free water   100 mL Per Tube Q4H   nutrition supplement (JUVEN)  1 packet Per Tube BID BM    mouth rinse  15 mL Mouth Rinse 4 times per day   thiamine   200 mg Per Tube Daily   Continuous Infusions:  feeding supplement (OSMOLITE 1.5 CAL) 1,000 mL (03/05/24 1550)   PRN Meds:.acetaminophen  **OR** acetaminophen , albuterol , ondansetron  **OR** ondansetron  (ZOFRAN ) IV, mouth rinse, polyethylene glycol  Antimicrobials: Anti-infectives (From admission, onward)    Start     Dose/Rate Route Frequency Ordered Stop   03/02/24 1130  doxycycline  (VIBRA -TABS) tablet 100 mg        100 mg Per Tube Every 12 hours 03/02/24 1041 04/08/24 2359   03/02/24 1130  amoxicillin -clavulanate (AUGMENTIN ) 875-125 MG per tablet 1 tablet        1 tablet Per Tube Every 12 hours 03/02/24 1041 04/08/24 2359   02/27/24 1400  cefTRIAXone  (ROCEPHIN ) 2 g in sodium chloride  0.9 % 100 mL IVPB  Status:  Discontinued        2 g 200 mL/hr over 30 Minutes Intravenous Every 24 hours 02/27/24 1247 03/02/24 1041   02/27/24 1400  DAPTOmycin  (CUBICIN ) IVPB 500 mg/50mL premix  Status:  Discontinued        500 mg 100 mL/hr over 30 Minutes Intravenous Daily 02/27/24 1247 03/02/24 1041        CBC: Recent Labs  Lab 02/29/24 0315 03/01/24 0335 03/02/24 0459 03/05/24 0356  WBC 7.9 6.7 8.6 9.2  HGB 9.9* 8.9* 9.5* 10.7*  HCT 30.1* 26.6* 29.2* 32.4*  MCV 95.0 94.3 93.6 94.5  PLT 261 260 300 437*   BMP &GFR Recent Labs  Lab 02/29/24 0315 03/01/24 0335 03/02/24 0459 03/05/24 0356  NA 142 138  --  135  K 3.0* 4.0  --  4.5  CL 109 107  --  103  CO2 22 22  --  25  GLUCOSE 150* 153*  --  141*  BUN 24* 25*  --  33*  CREATININE 0.72 0.70  --  0.83  CALCIUM  8.5* 8.1*  --  8.5*  MG 1.7 1.6* 2.0 2.1  PHOS 2.8 2.7 3.0  --    Estimated Creatinine Clearance: 42.5 mL/min (by C-G formula based on SCr of 0.83 mg/dL).  Liver & Pancreas: Recent Labs  Lab 03/05/24 0356  AST 40  ALT 33  ALKPHOS 91  BILITOT 0.3  PROT 6.0*  ALBUMIN 2.4*     Diabetic: Recent Labs  Lab 03/05/24 0611 03/05/24 1149 03/05/24 1853  03/05/24 2323 03/06/24 0631  GLUCAP 149* 153* 148* 132* 123*   Cardiac Enzymes: Recent Labs  Lab 03/03/24 0449  CKTOTAL 54    Microbiology: Recent Results (from the past 240 hours)  Resp panel by RT-PCR (RSV, Flu A&B, Covid) Anterior Nasal Swab     Status: None   Collection Time: 02/25/24  3:49 PM   Specimen: Anterior Nasal Swab  Result Value Ref Range Status   SARS Coronavirus 2 by RT PCR NEGATIVE NEGATIVE Final   Influenza A by PCR NEGATIVE NEGATIVE Final   Influenza B by PCR NEGATIVE NEGATIVE Final    Comment: (NOTE) The Xpert Xpress SARS-CoV-2/FLU/RSV plus assay is intended as an aid in the diagnosis of influenza from Nasopharyngeal swab specimens and should not be used as a sole basis for treatment. Nasal washings and aspirates are unacceptable for Xpert Xpress SARS-CoV-2/FLU/RSV testing.  Fact Sheet for Patients: BloggerCourse.com  Fact Sheet for Healthcare Providers: SeriousBroker.it  This test is not yet approved or cleared by the [place]  FDA and has been authorized for detection and/or diagnosis of SARS-CoV-2 by FDA under an Emergency Use Authorization (EUA). This EUA will remain in effect (meaning this test can be used) for the duration of the COVID-19 declaration under Section 564(b)(1) of the Act, 21 U.S.C. section 360bbb-3(b)(1), unless the authorization is terminated or revoked.     Resp Syncytial Virus by PCR NEGATIVE NEGATIVE Final    Comment: (NOTE) Fact Sheet for Patients: BloggerCourse.com  Fact Sheet for Healthcare Providers: SeriousBroker.it  This test is not yet approved or cleared by the [place]  FDA and has been authorized for detection and/or diagnosis of SARS-CoV-2 by FDA under an Emergency Use Authorization (EUA). This EUA will remain in effect (meaning this test can be used) for the duration of the COVID-19 declaration under  Section 564(b)(1) of the Act, 21 U.S.C. section 360bbb-3(b)(1), unless the authorization is terminated or revoked.  Performed at [facility], [address] [place], [place] [address]   Blood culture (routine x 2)     Status: None   Collection Time: 02/25/24  5:00 PM   Specimen: BLOOD RIGHT HAND  Result Value Ref Range Status   Specimen Description BLOOD RIGHT HAND  Final   Special Requests   Final    BOTTLES DRAWN AEROBIC ONLY Blood Culture results may not be optimal due to  an inadequate volume of blood received in culture bottles   Culture   Final    NO GROWTH 5 DAYS Performed at [facility], [address] [place], [place] [address]    Report Status 03/01/2024 FINAL  Final  Blood culture (routine x 2)     Status: None   Collection Time: 02/25/24  5:10 PM   Specimen: BLOOD LEFT HAND  Result Value Ref Range Status   Specimen Description BLOOD LEFT HAND  Final   Special Requests   Final    BOTTLES DRAWN AEROBIC ONLY Blood Culture results may not be optimal due to an inadequate volume of blood received in culture bottles   Culture   Final    NO GROWTH 5 DAYS Performed at [facility], [address] [place], [place] [address]    Report Status 03/01/2024 FINAL  Final  Respiratory (~20 pathogens) panel by PCR     Status: None   Collection Time: 03/02/24 10:39 AM   Specimen: Nasopharyngeal Swab; Respiratory  Result Value Ref Range Status   Adenovirus NOT DETECTED NOT DETECTED Final   Coronavirus 229E NOT DETECTED NOT DETECTED Final    Comment: (NOTE) The Coronavirus on the Respiratory Panel, DOES NOT test for the novel  Coronavirus (2019 nCoV)    Coronavirus HKU1 NOT DETECTED NOT DETECTED Final   Coronavirus NL63 NOT DETECTED NOT DETECTED Final   Coronavirus OC43 NOT DETECTED NOT DETECTED Final   Metapneumovirus NOT DETECTED NOT DETECTED Final   Rhinovirus / Enterovirus NOT DETECTED NOT DETECTED Final   Influenza A NOT DETECTED NOT DETECTED Final    Influenza B NOT DETECTED NOT DETECTED Final   Parainfluenza Virus 1 NOT DETECTED NOT DETECTED Final   Parainfluenza Virus 2 NOT DETECTED NOT DETECTED Final   Parainfluenza Virus 3 NOT DETECTED NOT DETECTED Final   Parainfluenza Virus 4 NOT DETECTED NOT DETECTED Final   Respiratory Syncytial Virus NOT DETECTED NOT DETECTED Final   Bordetella pertussis NOT DETECTED NOT DETECTED Final   Bordetella Parapertussis NOT DETECTED NOT DETECTED Final   Chlamydophila pneumoniae NOT DETECTED NOT DETECTED Final   Mycoplasma pneumoniae NOT DETECTED NOT DETECTED Final    Comment: Performed at [facility], [address], [place], [place] [address]    Radiology Studies: No results found.   Talin Feister  [facility]  If 7PM-7AM, please contact night-coverage [email or web address] 03/06/2024, 9:32 AM

## 2024-03-06 NOTE — Telephone Encounter (Signed)
 Spoke w/Pt wife regarding call from niece Arland. Wife reports Pt in hospital and has been since 7/22. 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 Wife stated Arland is not to receive any information regarding Pt. Informed wife that Arland was informed when she called that she is not listed on DPR and no information could be given to her. Wife voiced understanding and thanks for the call.

## 2024-03-06 NOTE — Plan of Care (Signed)
   Problem: Education: Goal: Knowledge of General Education information will improve Description Including pain rating scale, medication(s)/side effects and non-pharmacologic comfort measures Outcome: Progressing

## 2024-03-06 NOTE — Progress Notes (Signed)
 Nutrition Follow-up  DOCUMENTATION CODES:  Severe malnutrition in context of social or environmental circumstances, Underweight  INTERVENTION:  Continue tube feeding via cortrak: Osmolite 1.5 at 60 ml/h (1440 ml per day) Free water : 100mL every 4 hours Provides 2160 kcal, 90 gm protein, 1097 ml free water  daily (TF+flush = free water ) 1 packet Juven BID, each packet provides 95 calories, 2.5 grams of protein (collagen) + micronutrients to support wound healing  NUTRITION DIAGNOSIS:  Severe Malnutrition related to social / environmental circumstances (frequent hospitalizations and SNF stay) as evidenced by severe muscle depletion, severe fat depletion, percent weight loss (14% x 6 months). - remains applicable   GOAL:  Patient will meet greater than or equal to 90% of their needs - remains applicable  MONITOR:  TF tolerance, Diet advancement, Labs, Weight trends, Skin  REASON FOR ASSESSMENT:  Consult Assessment of nutrition requirement/status  ASSESSMENT:  Pt with hx of prior CVA and dementia presented to ED with AMS after taking a new medication. Per wife, pt too sleepy to eat or drink for several days  7/22 - presented to ED 7/25 - cortrak tube placed 7/31 - thin liquids allowed, full liquid diet entered  Pt resting in bed at the time of assessment. Wife not currently at bedside. TF infusing at goal via cortrak and continues to tolerate with normal type BM. Increased rate on 7/30.  Pt was able to have full liquid diet initiated by SLP, does have spillage when drinking from cup.    Did discuss weight discrepancy with RN. If able, RN will attempt to lift pt with hoyer to zero bed and then obtain a true weight.  Admit weight: 50.3 kg   Current weight:36.3 kg - not accurate  14% weight loss noted in the last ~6 months (08/2023-02/2024 which is severe)  Nutritionally Relevant Medications: Scheduled Meds:  amoxicillin -clavulanate  1 tablet Per Tube Q12H   doxazosin   1  mg Per Tube Daily   doxycycline   100 mg Per Tube Q12H   free water   100 mL Per Tube Q4H   JUVEN  1 packet Per Tube BID BM   thiamine   200 mg Per Tube Daily   Continuous Infusions:  feeding supplement (OSMOLITE 1.5 CAL) 1,000 mL (03/05/24 1550)   PRN Meds: ondansetron , polyethylene glycol  Labs Reviewed: CBG ranges from 110-145 mg/dL over the last 24 hours  NUTRITION - FOCUSED PHYSICAL EXAM: Flowsheet Row Most Recent Value  Orbital Region Severe depletion  Upper Arm Region Severe depletion  Thoracic and Lumbar Region Moderate depletion  Buccal Region Severe depletion  Temple Region Severe depletion  Clavicle Bone Region Severe depletion  Clavicle and Acromion Bone Region Moderate depletion  Scapular Bone Region Severe depletion  Dorsal Hand Moderate depletion  Patellar Region Severe depletion  Anterior Thigh Region Severe depletion  Posterior Calf Region Severe depletion  Edema (RD Assessment) None  Hair Reviewed  Eyes Reviewed  Mouth Reviewed  Skin Reviewed  Nails Reviewed    Diet Order:   Diet Order             Diet full liquid Room service appropriate? No; Fluid consistency: Thin  Diet effective now                   EDUCATION NEEDS:  Education needs have been addressed  Skin:  Skin Assessment: Reviewed RN Assessment Stage 4:  - sacrum (7 x 4 x 0.4 cm) Stage 1: - Left hip (4.5 x 6 cm) Unstageable: - left  heel (2 x 1 x 0.5 cm)  Last BM:  8/1 - type 4  Height:  Ht Readings from Last 1 Encounters:  02/25/24 5' 5 (1.651 m)    Weight:  Wt Readings from Last 1 Encounters:  03/04/24 36.3 kg    Ideal Body Weight:  61.8 kg  BMI:  Body mass index is 13.31 kg/m.  Estimated Nutritional Needs:  Kcal:  1700-2000 kcal/d Protein:  80-100g/d Fluid:  1.8-2L/d    Vernell Lukes, RD, LDN, CNSC Registered Dietitian II Please reach out via secure chat

## 2024-03-06 NOTE — Plan of Care (Signed)
  Problem: Education: Goal: Knowledge of General Education information will improve Description: Including pain rating scale, medication(s)/side effects and non-pharmacologic comfort measures Outcome: Not Progressing   Problem: Health Behavior/Discharge Planning: Goal: Ability to manage health-related needs will improve Outcome: Not Progressing   Problem: Clinical Measurements: Goal: Ability to maintain clinical measurements within normal limits will improve Outcome: Progressing Goal: Will remain free from infection Outcome: Progressing Goal: Diagnostic test results will improve Outcome: Progressing Goal: Respiratory complications will improve Outcome: Progressing Goal: Cardiovascular complication will be avoided Outcome: Progressing   Problem: Activity: Goal: Risk for activity intolerance will decrease Outcome: Not Progressing   Problem: Nutrition: Goal: Adequate nutrition will be maintained Outcome: Progressing   Problem: Coping: Goal: Level of anxiety will decrease Outcome: Progressing   Problem: Elimination: Goal: Will not experience complications related to bowel motility Outcome: Progressing Goal: Will not experience complications related to urinary retention Outcome: Progressing   Problem: Pain Managment: Goal: General experience of comfort will improve and/or be controlled Outcome: Progressing   Problem: Safety: Goal: Ability to remain free from injury will improve Outcome: Progressing

## 2024-03-07 DIAGNOSIS — M869 Osteomyelitis, unspecified: Secondary | ICD-10-CM | POA: Diagnosis not present

## 2024-03-07 DIAGNOSIS — R4182 Altered mental status, unspecified: Secondary | ICD-10-CM | POA: Diagnosis not present

## 2024-03-07 DIAGNOSIS — G9341 Metabolic encephalopathy: Secondary | ICD-10-CM | POA: Diagnosis not present

## 2024-03-07 LAB — COMPREHENSIVE METABOLIC PANEL WITH GFR
ALT: 25 U/L (ref 0–44)
AST: 29 U/L (ref 15–41)
Albumin: 2.5 g/dL — ABNORMAL LOW (ref 3.5–5.0)
Alkaline Phosphatase: 90 U/L (ref 38–126)
Anion gap: 7 (ref 5–15)
BUN: 45 mg/dL — ABNORMAL HIGH (ref 8–23)
CO2: 26 mmol/L (ref 22–32)
Calcium: 8.7 mg/dL — ABNORMAL LOW (ref 8.9–10.3)
Chloride: 103 mmol/L (ref 98–111)
Creatinine, Ser: 0.86 mg/dL (ref 0.61–1.24)
GFR, Estimated: >60 mL/min (ref >60)
Glucose, Bld: 122 mg/dL — ABNORMAL HIGH (ref 70–99)
Potassium: 4.5 mmol/L (ref 3.5–5.1)
Sodium: 136 mmol/L (ref 135–145)
Total Bilirubin: <0.2 mg/dL (ref 0.0–1.2)
Total Protein: 5.2 g/dL — ABNORMAL LOW (ref 6.5–8.1)

## 2024-03-07 LAB — MAGNESIUM: Magnesium: 2.2 mg/dL (ref 1.7–2.4)

## 2024-03-07 LAB — GLUCOSE, CAPILLARY
Glucose-Capillary: 134 mg/dL — ABNORMAL HIGH (ref 70–99)
Glucose-Capillary: 141 mg/dL — ABNORMAL HIGH (ref 70–99)
Glucose-Capillary: 119 mg/dL — ABNORMAL HIGH (ref 70–99)

## 2024-03-07 LAB — CBC
HCT: 30.9 % — ABNORMAL LOW (ref 39.0–52.0)
Hemoglobin: 10.2 g/dL — ABNORMAL LOW (ref 13.0–17.0)
MCH: 31.2 pg (ref 26.0–34.0)
MCHC: 33 g/dL (ref 30.0–36.0)
MCV: 94.5 fL (ref 80.0–100.0)
Platelets: 452 K/uL — ABNORMAL HIGH (ref 150–400)
RBC: 3.27 MIL/uL — ABNORMAL LOW (ref 4.22–5.81)
RDW: 13.4 % (ref 11.5–15.5)
WBC: 8.9 K/uL (ref 4.0–10.5)
nRBC: 0 % (ref 0.0–0.2)

## 2024-03-07 MED ORDER — KETOROLAC TROMETHAMINE 15 MG/ML IJ SOLN
15.0000 mg | Freq: Once | INTRAMUSCULAR | Status: AC
  Administered 2024-03-07: 15 mg via INTRAVENOUS
  Filled 2024-03-07: qty 1

## 2024-03-07 NOTE — Progress Notes (Signed)
 PROGRESS NOTE  Ernest Campbell FMW:[id number] DOB: Feb 07, 1951   PCP: Pcp, No  Patient is from: Home.  DOA: 02/25/2024 LOS: 9   Brief Narrative / Interim history: 73 year old M with PMH of parkinsonism, possible Lewy body dementia, anemia, sacral and left heel decubitus presenting with altered mental status since 02/21/2024 after he started Zyprexa  by his neurologist, and admitted with acute toxic and metabolic encephalopathy.  Workup in ED significant for lactic acidosis.  CT head and MRI brain without acute finding.  Left heel x-ray raise concern for osteomyelitis.  Neurology consulted. The next day, MRI of left heel suggested osteomyelitis.  Orthopedic surgery consulted but family declined surgery and chose nonoperative treatment.  Started on broad-spectrum antibiotics.  ID and palliative medicine consulted. Cortrack placed on 7/25 and started on TF.    Subjective: Patient noted to be a little bit more responsive this morning.  He was trying to vocalize.  Still not comprehensible.    Objective: Vitals:   03/06/24 1938 03/06/24 2259 03/07/24 0428 03/07/24 0900  BP: (!) 92/45 (!) 92/48 (!) 108/49 (!) 105/48  Pulse: 87 82 79 90  Resp: 18 18 18 18   Temp: 98 F (36.7 C) 99.5 F (37.5 C) 98.7 F (37.1 C) 98.4 F (36.9 C)  TempSrc: Oral Oral Oral Axillary  SpO2: 98% 98% 98% 97%  Weight:      Height:        Examination:  General appearance: Awake and alert today compared to yesterday. Resp: Clear to auscultation bilaterally.  Normal effort Cardio: S1-S2 is normal regular.  No S3-S4.  No rubs murmurs or bruit GI: Abdomen is soft.  Nontender nondistended.  Bowel sounds are present normal.  No masses organomegaly    Consultants:  Neurology Orthopedic surgery Infectious disease Palliative medicine  Procedures: None  Microbiology summarized: COVID-19, influenza and RSV PCR nonreactive Blood cultures NGTD  Assessment and plan:  Acute toxic and metabolic encephalopathy:   Patient with possible underlying Lewy body dementia.  Temporal association of mental status change with recent initiation of Zyprexa . Was given one dose of xanax  for a PET scan but was not on it on a regular basis.  CT head and MRI brain without acute finding.  EEG with moderate diffuse encephalopathy but no seizure or epileptiform discharge. UA does not suggest UTI.   Ammonia, B12, RPR, VBG and TSH unrevealing. Completed trial of high-dose thiamine  on 7/27. Patient has not shown any improvement in the last 1 week.  Neurology was reconsulted at family's request.  They saw the patient and have discussed in detail with patient's family.  According to neurology notes returned back to his baseline may not happen.  They suspect underlying progressive neurodegenerative condition.  No further testing. Prognosis remains guarded. Palliative care is following as well. Mentation seems to be a little bit better today.  Continue to monitor.  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  CRP elevated to 14.4.  ESR 39. Patient's wife refused amputation but requested ABI which showed mild disease.  Ongoing fever. -Initially evaluated by Dr. Harden.  -Wife requesting second opinion on 7/28.  Evaluated by Dr. Barton who recommends consulting plastics.  Seen by plastics who recommends continuing wound care and outpatient follow-up. ID was also consulted.  Patient has been on various different antibiotics including ceftriaxone  daptomycin  Augmentin  and doxycycline .   Currently on Augmentin  and doxycycline .  ID will follow patient in the outpatient setting.  ID  recommends continuing antibiotics until 04/07/2024. Due to persistent fever patient underwent CT scan of the abdomen pelvis which did not show any acute findings. Doppler studies without any DVT. Occasional low-grade fever noted.  Continue to monitor.  Recurrent fever:  See above.  Fever  of unknown origin.  Workup unremarkable so far including CT of the abdomen pelvis and Doppler studies.    Acute urinary retention:  Patient's wife reports history of enlarged prostate.  No prior abdominal imaging.  He had multiple I&O caths.  Initially patient's wife declined Foley but then subsequently agreed. -Started Cardura  1 mg daily per tube on 7/27.  -Continue Foley catheter 7/26>> Voiding trial once his activity level has improved.  Goal of care discussion:  Palliative care is following.  Patient's wife has gone back and forth DNR.  Currently full code.    Normocytic anemia:  H&H seems to be at baseline.    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  Parkinsonism/possible Lewy body dementia:  Outpatient follow-up  Mild bilateral lower extremity PAD:  LDL 49. Low-dose aspirin  per tube  Hypokalemia/hypomagnesemia Continue to monitor and supplement as indicated.  Hyponatremia: Resolved.  Severe malnutrition/failure to thrive Body mass index is 13.31 kg/m. Nutrition Problem: Severe Malnutrition Etiology: social / environmental circumstances (frequent hospitalizations and SNF stay) Signs/Symptoms: severe muscle depletion, severe fat depletion, percent weight loss (14% x 6 months) Percent weight loss: 14 % Interventions: Juven, MVI, Prostat, Tube feeding Continue with tube feedings.  Await mentation to improve so that oral feeds can be attempted.  DVT prophylaxis: Lovenox  Code Status: Full code Disposition: To be determined.     Sch Meds:  Scheduled Meds:  amoxicillin -clavulanate  1 tablet Per Tube Q12H   aspirin   81 mg Per Tube Daily   carbidopa -levodopa   1 tablet Per Tube TID   Chlorhexidine  Gluconate Cloth  6 each Topical Daily   collagenase    Topical Daily   doxazosin   1 mg Per Tube Daily   doxycycline   100 mg Per Tube Q12H   enoxaparin  (LOVENOX ) injection  40 mg Subcutaneous QHS   free water   100 mL Per Tube Q4H    nutrition supplement (JUVEN)  1 packet Per Tube BID BM   mouth rinse  15 mL Mouth Rinse 4 times per day   thiamine   200 mg Per Tube Daily   Continuous Infusions:  feeding supplement (OSMOLITE 1.5 CAL) 1,000 mL (03/07/24 0821)   PRN Meds:.acetaminophen  **OR** acetaminophen , albuterol , ondansetron  **OR** ondansetron  (ZOFRAN ) IV, mouth rinse, polyethylene glycol  Antimicrobials: Anti-infectives (From admission, onward)    Start     Dose/Rate Route Frequency Ordered Stop   03/02/24 1130  doxycycline  (VIBRA -TABS) tablet 100 mg        100 mg Per Tube Every 12 hours 03/02/24 1041 04/08/24 2359   03/02/24 1130  amoxicillin -clavulanate (AUGMENTIN ) 875-125 MG per tablet 1 tablet        1 tablet Per Tube Every 12 hours 03/02/24 1041 04/08/24 2359   02/27/24 1400  cefTRIAXone  (ROCEPHIN ) 2 g in sodium chloride  0.9 % 100 mL IVPB  Status:  Discontinued        2 g 200 mL/hr over 30 Minutes Intravenous Every 24 hours 02/27/24 1247 03/02/24 1041   02/27/24 1400  DAPTOmycin  (CUBICIN ) IVPB 500 mg/50mL premix  Status:  Discontinued        500 mg 100 mL/hr over 30 Minutes Intravenous Daily 02/27/24 1247 03/02/24 1041  CBC: Recent Labs  Lab 03/01/24 0335 03/02/24 0459 03/05/24 0356 03/07/24 0301  WBC 6.7 8.6 9.2 8.9  HGB 8.9* 9.5* 10.7* 10.2*  HCT 26.6* 29.2* 32.4* 30.9*  MCV 94.3 93.6 94.5 94.5  PLT 260 300 437* 452*   BMP &GFR Recent Labs  Lab 03/01/24 0335 03/02/24 0459 03/05/24 0356 03/07/24 0301  NA 138  --  135 136  K 4.0  --  4.5 4.5  CL 107  --  103 103  CO2 22  --  25 26  GLUCOSE 153*  --  141* 122*  BUN 25*  --  33* 45*  CREATININE 0.70  --  0.83 0.86  CALCIUM  8.1*  --  8.5* 8.7*  MG 1.6* 2.0 2.1 2.2  PHOS 2.7 3.0  --   --    Estimated Creatinine Clearance: 41 mL/min (by C-G formula based on SCr of 0.86 mg/dL).  Liver & Pancreas: Recent Labs  Lab 03/05/24 0356 03/07/24 0301  AST 40 29  ALT 33 25  ALKPHOS 91 90  BILITOT 0.3 <0.2  PROT 6.0* 5.2*  ALBUMIN  2.4* 2.5*     Diabetic: Recent Labs  Lab 03/06/24 0631 03/06/24 1159 03/06/24 1749 03/06/24 2257 03/07/24 0628  GLUCAP 123* 135* 156* 125* 134*   Cardiac Enzymes: Recent Labs  Lab 03/03/24 0449  CKTOTAL 54    Microbiology: Recent Results (from the past 240 hours)  Respiratory (~20 pathogens) panel by PCR     Status: None   Collection Time: 03/02/24 10:39 AM   Specimen: Nasopharyngeal Swab; Respiratory  Result Value Ref Range Status   Adenovirus NOT DETECTED NOT DETECTED Final   Coronavirus 229E NOT DETECTED NOT DETECTED Final    Comment: (NOTE) The Coronavirus on the Respiratory Panel, DOES NOT test for the novel  Coronavirus (2019 nCoV)    Coronavirus HKU1 NOT DETECTED NOT DETECTED Final   Coronavirus NL63 NOT DETECTED NOT DETECTED Final   Coronavirus OC43 NOT DETECTED NOT DETECTED Final   Metapneumovirus NOT DETECTED NOT DETECTED Final   Rhinovirus / Enterovirus NOT DETECTED NOT DETECTED Final   Influenza A NOT DETECTED NOT DETECTED Final   Influenza B NOT DETECTED NOT DETECTED Final   Parainfluenza Virus 1 NOT DETECTED NOT DETECTED Final   Parainfluenza Virus 2 NOT DETECTED NOT DETECTED Final   Parainfluenza Virus 3 NOT DETECTED NOT DETECTED Final   Parainfluenza Virus 4 NOT DETECTED NOT DETECTED Final   Respiratory Syncytial Virus NOT DETECTED NOT DETECTED Final   Bordetella pertussis NOT DETECTED NOT DETECTED Final   Bordetella Parapertussis NOT DETECTED NOT DETECTED Final   Chlamydophila pneumoniae NOT DETECTED NOT DETECTED Final   Mycoplasma pneumoniae NOT DETECTED NOT DETECTED Final    Comment: Performed at [facility], [address], [place], [place] [address]    Radiology Studies: No results found.   Senia Even  [facility]  If 7PM-7AM, please contact night-coverage [email or web address] 03/07/2024, 9:11 AM

## 2024-03-07 NOTE — Plan of Care (Signed)
  Problem: Clinical Measurements: Goal: Ability to maintain clinical measurements within normal limits will improve Outcome: Progressing Goal: Will remain free from infection Outcome: Progressing Goal: Diagnostic test results will improve Outcome: Progressing Goal: Respiratory complications will improve Outcome: Progressing Goal: Cardiovascular complication will be avoided Outcome: Progressing   Problem: Nutrition: Goal: Adequate nutrition will be maintained Outcome: Progressing   Problem: Coping: Goal: Level of anxiety will decrease Outcome: Progressing   Problem: Elimination: Goal: Will not experience complications related to bowel motility Outcome: Progressing Goal: Will not experience complications related to urinary retention Outcome: Progressing   Problem: Pain Managment: Goal: General experience of comfort will improve and/or be controlled Outcome: Progressing   Problem: Safety: Goal: Ability to remain free from injury will improve Outcome: Progressing   Problem: Skin Integrity: Goal: Risk for impaired skin integrity will decrease Outcome: Progressing   Problem: Education: Goal: Knowledge of General Education information will improve Description: Including pain rating scale, medication(s)/side effects and non-pharmacologic comfort measures Outcome: Not Progressing   Problem: Health Behavior/Discharge Planning: Goal: Ability to manage health-related needs will improve Outcome: Not Progressing   Problem: Activity: Goal: Risk for activity intolerance will decrease Outcome: Not Progressing

## 2024-03-07 NOTE — Plan of Care (Signed)
  Problem: Education: Goal: Knowledge of General Education information will improve Description: Including pain rating scale, medication(s)/side effects and non-pharmacologic comfort measures Outcome: Not Progressing   Problem: Health Behavior/Discharge Planning: Goal: Ability to manage health-related needs will improve Outcome: Not Progressing   Problem: Clinical Measurements: Goal: Ability to maintain clinical measurements within normal limits will improve Outcome: Progressing Goal: Diagnostic test results will improve Outcome: Progressing Goal: Respiratory complications will improve Outcome: Progressing Goal: Cardiovascular complication will be avoided Outcome: Progressing   Problem: Activity: Goal: Risk for activity intolerance will decrease Outcome: Not Progressing   Problem: Nutrition: Goal: Adequate nutrition will be maintained Outcome: Progressing   Problem: Coping: Goal: Level of anxiety will decrease Outcome: Progressing   Problem: Elimination: Goal: Will not experience complications related to urinary retention Outcome: Progressing   Problem: Pain Managment: Goal: General experience of comfort will improve and/or be controlled Outcome: Progressing   Problem: Safety: Goal: Ability to remain free from injury will improve Outcome: Progressing

## 2024-03-08 DIAGNOSIS — G9341 Metabolic encephalopathy: Secondary | ICD-10-CM | POA: Diagnosis not present

## 2024-03-08 DIAGNOSIS — R4182 Altered mental status, unspecified: Secondary | ICD-10-CM | POA: Diagnosis not present

## 2024-03-08 DIAGNOSIS — M869 Osteomyelitis, unspecified: Secondary | ICD-10-CM | POA: Diagnosis not present

## 2024-03-08 LAB — GLUCOSE, CAPILLARY
Glucose-Capillary: 104 mg/dL — ABNORMAL HIGH (ref 70–99)
Glucose-Capillary: 130 mg/dL — ABNORMAL HIGH (ref 70–99)
Glucose-Capillary: 138 mg/dL — ABNORMAL HIGH (ref 70–99)
Glucose-Capillary: 165 mg/dL — ABNORMAL HIGH (ref 70–99)
Glucose-Capillary: 95 mg/dL (ref 70–99)

## 2024-03-08 MED ORDER — KETOROLAC TROMETHAMINE 15 MG/ML IJ SOLN
15.0000 mg | Freq: Three times a day (TID) | INTRAMUSCULAR | Status: AC | PRN
  Administered 2024-03-08: 15 mg via INTRAVENOUS
  Filled 2024-03-08: qty 1

## 2024-03-08 NOTE — Progress Notes (Signed)
 PROGRESS NOTE  Ernest Campbell FMW:[id number] DOB: 1950/12/23   PCP: Pcp, No  Patient is from: Home.  DOA: 02/25/2024 LOS: 10   Brief Narrative / Interim history: 73 year old M with PMH of parkinsonism, possible Lewy body dementia, anemia, sacral and left heel decubitus presenting with altered mental status since 02/21/2024 after he started Zyprexa  by his neurologist, and admitted with acute toxic and metabolic encephalopathy.  Workup in ED significant for lactic acidosis.  CT head and MRI brain without acute finding.  Left heel x-ray raise concern for osteomyelitis.  Neurology consulted. The next day, MRI of left heel suggested osteomyelitis.  Orthopedic surgery consulted but family declined surgery and chose nonoperative treatment.  Started on broad-spectrum antibiotics.  ID and palliative medicine consulted. Cortrack placed on 7/25 and started on TF.    Subjective: Patient is awake.  He even smiled. But does not respond to my questions.  Objective: Vitals:   03/07/24 2019 03/08/24 0014 03/08/24 0353 03/08/24 0742  BP: (!) 103/43 (!) 109/51 (!) 113/46 121/60  Pulse: 85 82 83 78  Resp: 12 13 12 14   Temp: 98.7 F (37.1 C) 100.2 F (37.9 C) 98.1 F (36.7 C) 98.1 F (36.7 C)  TempSrc: Oral Oral Oral Axillary  SpO2: 98% 99% 100% 100%  Weight:      Height:        Examination:  General appearance: Awake alert.  In no distress Resp: Clear to auscultation bilaterally.  Normal effort Cardio: S1-S2 is normal regular.  No S3-S4.  No rubs murmurs or bruit GI: Abdomen is soft.  Nontender nondistended.  Bowel sounds are present normal.  No masses organomegaly     Consultants:  Neurology Orthopedic surgery Infectious disease Palliative medicine  Procedures: None  Microbiology summarized: COVID-19, influenza and RSV PCR nonreactive Blood cultures NGTD  Assessment and plan:  Acute toxic and metabolic encephalopathy:  Patient with possible underlying Lewy body dementia.   Temporal association of mental status change with recent initiation of Zyprexa .  Was given one dose of xanax  for a PET scan but was not on it on a regular basis.  CT head and MRI brain without acute finding.  EEG with moderate diffuse encephalopathy but no seizure or epileptiform discharge. UA does not suggest UTI.   Ammonia, B12, RPR, VBG and TSH unrevealing. Completed trial of high-dose thiamine  on 7/27. Patient has not shown any improvement in the last 1 week.  Neurology was reconsulted at family's request.  They saw the patient and have discussed in detail with patient's family.  According to neurology notes returned back to his baseline may not happen.  They suspect underlying progressive neurodegenerative condition.  No further testing. Prognosis remains guarded. Palliative care is following as well. Patient remains stable without much improvement.  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  CRP elevated to 14.4.  ESR 39. Patient's wife refused amputation but requested ABI which showed mild disease.  Ongoing fever. -Initially evaluated by Dr. Harden.  -Wife requesting second opinion on 7/28.  Evaluated by Dr. Barton who recommends consulting plastics.  Seen by plastics who recommends continuing wound care and outpatient follow-up. ID was also consulted.  Patient has been on various different antibiotics including ceftriaxone  daptomycin  Augmentin  and doxycycline .   Currently on Augmentin  and doxycycline .  ID will follow patient in the outpatient setting.  ID recommends continuing antibiotics until 04/07/2024. Due to persistent fever patient underwent CT scan of the abdomen  pelvis which did not show any acute findings. Doppler studies without any DVT. Occasional low-grade fever noted.  Continue to monitor.  Recurrent fever:  See above.  Fever of unknown origin.  Workup unremarkable so far including CT of the abdomen  pelvis and Doppler studies.    Acute urinary retention:  Patient's wife reports history of enlarged prostate.  No prior abdominal imaging.  He had multiple I&O caths.  Initially patient's wife declined Foley but then subsequently agreed. -Started Cardura  1 mg daily per tube on 7/27.  -Continue Foley catheter 7/26>> Voiding trial once his activity level has improved.  Goal of care discussion:  Palliative care is following.  Patient's wife has gone back and forth DNR.  Currently full code.    Normocytic anemia:  H&H seems to be at baseline.    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  Parkinsonism/possible Lewy body dementia:  Outpatient follow-up  Mild bilateral lower extremity PAD:  LDL 49. Low-dose aspirin  per tube  Hypokalemia/hypomagnesemia Continue to monitor and supplement as indicated.  Hyponatremia: Resolved.  Dysphagia/severe malnutrition/failure to thrive Continue with tube feedings.  His therapy is following.  Full liquid diet with low aspiration precautions.   DVT prophylaxis: Lovenox  Code Status: Full code Disposition: To be determined.     Sch Meds:  Scheduled Meds:  amoxicillin -clavulanate  1 tablet Per Tube Q12H   aspirin   81 mg Per Tube Daily   carbidopa -levodopa   1 tablet Per Tube TID   Chlorhexidine  Gluconate Cloth  6 each Topical Daily   collagenase    Topical Daily   doxazosin   1 mg Per Tube Daily   doxycycline   100 mg Per Tube Q12H   enoxaparin  (LOVENOX ) injection  40 mg Subcutaneous QHS   free water   100 mL Per Tube Q4H   nutrition supplement (JUVEN)  1 packet Per Tube BID BM   mouth rinse  15 mL Mouth Rinse 4 times per day   thiamine   200 mg Per Tube Daily   Continuous Infusions:  feeding supplement (OSMOLITE 1.5 CAL) 60 mL/hr at 03/07/24 1648   PRN Meds:.acetaminophen  **OR** acetaminophen , albuterol , ondansetron  **OR** ondansetron  (ZOFRAN ) IV, mouth rinse, polyethylene  glycol  Antimicrobials: Anti-infectives (From admission, onward)    Start     Dose/Rate Route Frequency Ordered Stop   03/02/24 1130  doxycycline  (VIBRA -TABS) tablet 100 mg        100 mg Per Tube Every 12 hours 03/02/24 1041 04/08/24 2359   03/02/24 1130  amoxicillin -clavulanate (AUGMENTIN ) 875-125 MG per tablet 1 tablet        1 tablet Per Tube Every 12 hours 03/02/24 1041 04/08/24 2359   02/27/24 1400  cefTRIAXone  (ROCEPHIN ) 2 g in sodium chloride  0.9 % 100 mL IVPB  Status:  Discontinued        2 g 200 mL/hr over 30 Minutes Intravenous Every 24 hours 02/27/24 1247 03/02/24 1041   02/27/24 1400  DAPTOmycin  (CUBICIN ) IVPB 500 mg/5mL premix  Status:  Discontinued        500 mg 100 mL/hr over 30 Minutes Intravenous Daily 02/27/24 1247 03/02/24 1041        CBC: Recent Labs  Lab 03/02/24 0459 03/05/24 0356 03/07/24 0301  WBC 8.6 9.2 8.9  HGB 9.5* 10.7* 10.2*  HCT 29.2* 32.4* 30.9*  MCV 93.6 94.5 94.5  PLT 300 437* 452*   BMP &GFR Recent Labs  Lab 03/02/24 0459 03/05/24 0356 03/07/24 0301  NA  --  135 136  K  --  4.5 4.5  CL  --  103 103  CO2  --  25 26  GLUCOSE  --  141* 122*  BUN  --  33* 45*  CREATININE  --  0.83 0.86  CALCIUM   --  8.5* 8.7*  MG 2.0 2.1 2.2  PHOS 3.0  --   --    Estimated Creatinine Clearance: 41 mL/min (by C-G formula based on SCr of 0.86 mg/dL).  Liver & Pancreas: Recent Labs  Lab 03/05/24 0356 03/07/24 0301  AST 40 29  ALT 33 25  ALKPHOS 91 90  BILITOT 0.3 <0.2  PROT 6.0* 5.2*  ALBUMIN 2.4* 2.5*     Diabetic: Recent Labs  Lab 03/07/24 0628 03/07/24 1221 03/07/24 1618 03/08/24 0016 03/08/24 0629  GLUCAP 134* 141* 119* 95 104*   Cardiac Enzymes: Recent Labs  Lab 03/03/24 0449  CKTOTAL 54    Microbiology: Recent Results (from the past 240 hours)  Respiratory (~20 pathogens) panel by PCR     Status: None   Collection Time: 03/02/24 10:39 AM   Specimen: Nasopharyngeal Swab; Respiratory  Result Value Ref Range Status    Adenovirus NOT DETECTED NOT DETECTED Final   Coronavirus 229E NOT DETECTED NOT DETECTED Final    Comment: (NOTE) The Coronavirus on the Respiratory Panel, DOES NOT test for the novel  Coronavirus (2019 nCoV)    Coronavirus HKU1 NOT DETECTED NOT DETECTED Final   Coronavirus NL63 NOT DETECTED NOT DETECTED Final   Coronavirus OC43 NOT DETECTED NOT DETECTED Final   Metapneumovirus NOT DETECTED NOT DETECTED Final   Rhinovirus / Enterovirus NOT DETECTED NOT DETECTED Final   Influenza A NOT DETECTED NOT DETECTED Final   Influenza B NOT DETECTED NOT DETECTED Final   Parainfluenza Virus 1 NOT DETECTED NOT DETECTED Final   Parainfluenza Virus 2 NOT DETECTED NOT DETECTED Final   Parainfluenza Virus 3 NOT DETECTED NOT DETECTED Final   Parainfluenza Virus 4 NOT DETECTED NOT DETECTED Final   Respiratory Syncytial Virus NOT DETECTED NOT DETECTED Final   Bordetella pertussis NOT DETECTED NOT DETECTED Final   Bordetella Parapertussis NOT DETECTED NOT DETECTED Final   Chlamydophila pneumoniae NOT DETECTED NOT DETECTED Final   Mycoplasma pneumoniae NOT DETECTED NOT DETECTED Final    Comment: Performed at [facility], [address], [place], [place] [address]    Radiology Studies: No results found.   Ernest Campbell  [facility]  If 7PM-7AM, please contact night-coverage [email or web address] 03/08/2024, 9:02 AM

## 2024-03-08 NOTE — Plan of Care (Signed)
  Problem: Education: Goal: Knowledge of General Education information will improve Description: Including pain rating scale, medication(s)/side effects and non-pharmacologic comfort measures Outcome: Not Progressing   Problem: Health Behavior/Discharge Planning: Goal: Ability to manage health-related needs will improve Outcome: Not Progressing   Problem: Clinical Measurements: Goal: Diagnostic test results will improve Outcome: Progressing Goal: Respiratory complications will improve Outcome: Progressing Goal: Cardiovascular complication will be avoided Outcome: Progressing   Problem: Activity: Goal: Risk for activity intolerance will decrease Outcome: Progressing   Problem: Nutrition: Goal: Adequate nutrition will be maintained Outcome: Progressing   Problem: Coping: Goal: Level of anxiety will decrease Outcome: Progressing   Problem: Elimination: Goal: Will not experience complications related to urinary retention Outcome: Progressing   Problem: Pain Managment: Goal: General experience of comfort will improve and/or be controlled Outcome: Progressing   Problem: Safety: Goal: Ability to remain free from injury will improve Outcome: Progressing   Problem: Skin Integrity: Goal: Risk for impaired skin integrity will decrease Outcome: Progressing

## 2024-03-08 NOTE — Plan of Care (Signed)
  Problem: Clinical Measurements: Goal: Ability to maintain clinical measurements within normal limits will improve Outcome: Progressing Goal: Will remain free from infection Outcome: Progressing Goal: Diagnostic test results will improve Outcome: Progressing Goal: Respiratory complications will improve Outcome: Progressing Goal: Cardiovascular complication will be avoided Outcome: Progressing   Problem: Nutrition: Goal: Adequate nutrition will be maintained Outcome: Progressing   Problem: Coping: Goal: Level of anxiety will decrease Outcome: Progressing   Problem: Elimination: Goal: Will not experience complications related to bowel motility Outcome: Progressing Goal: Will not experience complications related to urinary retention Outcome: Progressing   Problem: Pain Managment: Goal: General experience of comfort will improve and/or be controlled Outcome: Progressing   Problem: Safety: Goal: Ability to remain free from injury will improve Outcome: Progressing   Problem: Skin Integrity: Goal: Risk for impaired skin integrity will decrease Outcome: Progressing   Problem: Education: Goal: Knowledge of General Education information will improve Description: Including pain rating scale, medication(s)/side effects and non-pharmacologic comfort measures Outcome: Not Progressing   Problem: Health Behavior/Discharge Planning: Goal: Ability to manage health-related needs will improve Outcome: Not Progressing   Problem: Activity: Goal: Risk for activity intolerance will decrease Outcome: Not Progressing

## 2024-03-09 DIAGNOSIS — M869 Osteomyelitis, unspecified: Secondary | ICD-10-CM | POA: Diagnosis not present

## 2024-03-09 DIAGNOSIS — R4182 Altered mental status, unspecified: Secondary | ICD-10-CM | POA: Diagnosis not present

## 2024-03-09 DIAGNOSIS — G9341 Metabolic encephalopathy: Secondary | ICD-10-CM | POA: Diagnosis not present

## 2024-03-09 LAB — GLUCOSE, CAPILLARY
Glucose-Capillary: 134 mg/dL — ABNORMAL HIGH (ref 70–99)
Glucose-Capillary: 128 mg/dL — ABNORMAL HIGH (ref 70–99)
Glucose-Capillary: 116 mg/dL — ABNORMAL HIGH (ref 70–99)

## 2024-03-09 MED ORDER — ACETAMINOPHEN 325 MG PO TABS
650.0000 mg | ORAL_TABLET | Freq: Four times a day (QID) | ORAL | Status: DC | PRN
  Administered 2024-03-09 – 2024-03-16 (×9): 650 mg
  Filled 2024-03-09 (×8): qty 2

## 2024-03-09 MED ORDER — ACETAMINOPHEN 650 MG RE SUPP: 650.0000 mg | Freq: Four times a day (QID) | RECTAL | Status: DC | PRN

## 2024-03-09 NOTE — Progress Notes (Signed)
 PROGRESS NOTE  VIKTOR PHILIPP FMW:[id number] DOB: June 27, 1951   PCP: Pcp, No  Patient is from: Home.  DOA: 02/25/2024 LOS: 11   Brief Narrative / Interim history: 73 year old M with PMH of parkinsonism, possible Lewy body dementia, anemia, sacral and left heel decubitus presenting with altered mental status since 02/21/2024 after he started Zyprexa  by his neurologist, and admitted with acute toxic and metabolic encephalopathy.  Workup in ED significant for lactic acidosis.  CT head and MRI brain without acute finding.  Left heel x-ray raise concern for osteomyelitis.  Neurology consulted. The next day, MRI of left heel suggested osteomyelitis.  Orthopedic surgery consulted but family declined surgery and chose nonoperative treatment.  Started on broad-spectrum antibiotics.  ID and palliative medicine consulted. Cortrack placed on 7/25 and started on TF.    Subjective: Patient with eyes open but does not really communicate or even acknowledge me this morning.  Objective: Vitals:   03/08/24 2339 03/09/24 0434 03/09/24 0731 03/09/24 0744  BP: 109/62 (!) 124/58  113/61  Pulse: 84 81  88  Resp: 20 18  18   Temp: 98.4 F (36.9 C) 99.1 F (37.3 C)  97.8 F (36.6 C)  TempSrc: Oral Oral  Oral  SpO2: 100% 99%  99%  Weight:   59 kg   Height:        Examination:  General appearance: Eyes are open but does not communicate Core track is noted Resp: Clear to auscultation bilaterally.  Normal effort Cardio: S1-S2 is normal regular.  No S3-S4.  No rubs murmurs or bruit GI: Abdomen is soft.  Nontender nondistended.  Bowel sounds are present normal.  No masses organomegaly   Consultants:  Neurology Orthopedic surgery Infectious disease Palliative medicine  Procedures: None  Microbiology summarized: COVID-19, influenza and RSV PCR nonreactive Blood cultures NGTD  Assessment and plan:  Acute toxic and metabolic encephalopathy:  Patient with possible underlying Lewy body dementia.   Temporal association of mental status change with recent initiation of Zyprexa .  Was given one dose of xanax  for a PET scan but was not on it on a regular basis.  CT head and MRI brain without acute finding.  EEG with moderate diffuse encephalopathy but no seizure or epileptiform discharge. UA does not suggest UTI.   Ammonia, B12, RPR, VBG and TSH unrevealing. Completed trial of high-dose thiamine  on 7/27. Patient has not shown any improvement in the last 1 week.  Neurology was reconsulted at family's request.  They saw the patient and have discussed in detail with patient's family.  According to neurology notes returned back to his baseline may not happen.  They suspect underlying progressive neurodegenerative condition.  No further testing. Prognosis remains guarded. Palliative care is following as well. Patient remains stable without much improvement.  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  CRP elevated to 14.4.  ESR 39. Patient's wife refused amputation but requested ABI which showed mild disease.  Ongoing fever. -Initially evaluated by Dr. Harden.  -Wife requesting second opinion on 7/28.  Evaluated by Dr. Barton who recommends consulting plastics.  Seen by plastics who recommends continuing wound care and outpatient follow-up. ID was also consulted.  Patient has been on various different antibiotics including ceftriaxone  daptomycin  Augmentin  and doxycycline .   Currently on Augmentin  and doxycycline .  ID will follow patient in the outpatient setting.  ID recommends continuing antibiotics until 04/07/2024. Due to persistent fever patient underwent CT scan of the abdomen  pelvis which did not show any acute findings. Doppler studies without any DVT. Occasional low-grade fever noted.  WBC has been normal.  Continue to monitor.  Recurrent fever:  See above.  Fever of unknown origin.  Workup unremarkable so far  including CT of the abdomen pelvis and Doppler studies.    Acute urinary retention:  Patient's wife reports history of enlarged prostate.  No prior abdominal imaging.  He had multiple I&O caths.  Initially patient's wife declined Foley but then subsequently agreed. Started Cardura  1 mg daily per tube on 7/27.  Continue Foley catheter 7/26>> Voiding trial once his activity level has improved.  Goal of care discussion:  Palliative care is following.  Patient's wife has gone back and forth DNR.  Currently full code.    Normocytic anemia:  H&H seems to be at baseline.    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  Parkinsonism/possible Lewy body dementia:  Outpatient follow-up  Mild bilateral lower extremity PAD:  LDL 49. Low-dose aspirin  per tube  Hypokalemia/hypomagnesemia Continue to monitor and supplement as indicated.  Hyponatremia: Resolved.  Dysphagia/severe malnutrition/failure to thrive Continue with tube feedings.  Speech therapy is following.  Full liquid diet with low aspiration precautions.   DVT prophylaxis: Lovenox  Code Status: Full code Disposition: To be determined.     Sch Meds:  Scheduled Meds:  amoxicillin -clavulanate  1 tablet Per Tube Q12H   aspirin   81 mg Per Tube Daily   carbidopa -levodopa   1 tablet Per Tube TID   Chlorhexidine  Gluconate Cloth  6 each Topical Daily   collagenase    Topical Daily   doxazosin   1 mg Per Tube Daily   doxycycline   100 mg Per Tube Q12H   enoxaparin  (LOVENOX ) injection  40 mg Subcutaneous QHS   free water   100 mL Per Tube Q4H   nutrition supplement (JUVEN)  1 packet Per Tube BID BM   mouth rinse  15 mL Mouth Rinse 4 times per day   thiamine   200 mg Per Tube Daily   Continuous Infusions:  feeding supplement (OSMOLITE 1.5 CAL) 60 mL/hr at 03/08/24 1832   PRN Meds:.acetaminophen  **OR** acetaminophen , albuterol , ketorolac , ondansetron  **OR** ondansetron  (ZOFRAN ) IV, mouth rinse,  polyethylene glycol   CBC: Recent Labs  Lab 03/05/24 0356 03/07/24 0301  WBC 9.2 8.9  HGB 10.7* 10.2*  HCT 32.4* 30.9*  MCV 94.5 94.5  PLT 437* 452*   BMP &GFR Recent Labs  Lab 03/05/24 0356 03/07/24 0301  NA 135 136  K 4.5 4.5  CL 103 103  CO2 25 26  GLUCOSE 141* 122*  BUN 33* 45*  CREATININE 0.83 0.86  CALCIUM  8.5* 8.7*  MG 2.1 2.2   Estimated Creatinine Clearance: 65.7 mL/min (by C-G formula based on SCr of 0.86 mg/dL).  Liver & Pancreas: Recent Labs  Lab 03/05/24 0356 03/07/24 0301  AST 40 29  ALT 33 25  ALKPHOS 91 90  BILITOT 0.3 <0.2  PROT 6.0* 5.2*  ALBUMIN 2.4* 2.5*     Diabetic: Recent Labs  Lab 03/08/24 0629 03/08/24 1531 03/08/24 1932 03/08/24 2345 03/09/24 0607  GLUCAP 104* 165* 138* 130* 134*   Cardiac Enzymes: Recent Labs  Lab 03/03/24 0449  CKTOTAL 54    Microbiology: Recent Results (from the past 240 hours)  Respiratory (~20 pathogens) panel by PCR     Status: None   Collection Time: 03/02/24 10:39 AM   Specimen: Nasopharyngeal Swab; Respiratory  Result Value Ref Range Status  Adenovirus NOT DETECTED NOT DETECTED Final   Coronavirus 229E NOT DETECTED NOT DETECTED Final    Comment: (NOTE) The Coronavirus on the Respiratory Panel, DOES NOT test for the novel  Coronavirus (2019 nCoV)    Coronavirus HKU1 NOT DETECTED NOT DETECTED Final   Coronavirus NL63 NOT DETECTED NOT DETECTED Final   Coronavirus OC43 NOT DETECTED NOT DETECTED Final   Metapneumovirus NOT DETECTED NOT DETECTED Final   Rhinovirus / Enterovirus NOT DETECTED NOT DETECTED Final   Influenza A NOT DETECTED NOT DETECTED Final   Influenza B NOT DETECTED NOT DETECTED Final   Parainfluenza Virus 1 NOT DETECTED NOT DETECTED Final   Parainfluenza Virus 2 NOT DETECTED NOT DETECTED Final   Parainfluenza Virus 3 NOT DETECTED NOT DETECTED Final   Parainfluenza Virus 4 NOT DETECTED NOT DETECTED Final   Respiratory Syncytial Virus NOT DETECTED NOT DETECTED Final    Bordetella pertussis NOT DETECTED NOT DETECTED Final   Bordetella Parapertussis NOT DETECTED NOT DETECTED Final   Chlamydophila pneumoniae NOT DETECTED NOT DETECTED Final   Mycoplasma pneumoniae NOT DETECTED NOT DETECTED Final    Comment: Performed at [facility], [address] [place], [place] [address]    Radiology Studies: No results found.   Marilouise Densmore  [facility]  If 7PM-7AM, please contact night-coverage [email or web address] 03/09/2024, 9:34 AM

## 2024-03-09 NOTE — TOC Progression Note (Signed)
 Transition of Care [facility]) - Progression Note    Patient Details  Name: Ernest Campbell MRN: [id number] Date of Birth: 26-Sep-1950  [facility] of [facility]) CM/SW Contact  [name], RN Phone Number: 03/09/2024, 2:31 PM  Clinical Narrative:     Pt continues with cortrak. Wife wanting to continue medical treatments.  IP Care Management following.   Expected Discharge Plan: Home w Home Health Services                 Expected Discharge Plan and Services                                               Social Drivers of Health (SDOH) Interventions SDOH Screenings   Food Insecurity: Patient Unable To Answer (02/27/2024)  Housing: Unknown (02/27/2024)  Transportation Needs: Patient Unable To Answer (02/27/2024)  Utilities: Patient Unable To Answer (02/27/2024)  Financial Resource Strain: Low Risk  (10/20/2022)   Received from [facility]  Physical Activity: Unknown (10/20/2022)   Received from [facility]  Social Connections: Unknown (02/27/2024)  Stress: Patient Declined (10/20/2022)   Received from [facility]  Tobacco Use: High Risk (02/25/2024)    Readmission Risk Interventions    11/08/2023    1:14 PM  Readmission Risk Prevention Plan  Transportation Screening   Medication Review (RN Care Manager)   PCP or Specialist appointment within 3-5 days of discharge   PCP/Specialist Appt Not Complete comments   HRI or Home Care Consult   SW Recovery Care/Counseling Consult   Palliative Care Screening   Skilled Nursing Facility      Information is confidential and restricted. Go to Review Flowsheets to unlock data.

## 2024-03-09 NOTE — Plan of Care (Signed)
  Problem: Education: Goal: Knowledge of General Education information will improve Description: Including pain rating scale, medication(s)/side effects and non-pharmacologic comfort measures Outcome: Not Progressing   Problem: Health Behavior/Discharge Planning: Goal: Ability to manage health-related needs will improve Outcome: Not Progressing   Problem: Clinical Measurements: Goal: Ability to maintain clinical measurements within normal limits will improve Outcome: Progressing Goal: Will remain free from infection Outcome: Progressing Goal: Diagnostic test results will improve Outcome: Progressing Goal: Respiratory complications will improve Outcome: Progressing Goal: Cardiovascular complication will be avoided Outcome: Progressing   Problem: Activity: Goal: Risk for activity intolerance will decrease Outcome: Not Progressing   Problem: Nutrition: Goal: Adequate nutrition will be maintained Outcome: Progressing   Problem: Coping: Goal: Level of anxiety will decrease Outcome: Progressing   Problem: Pain Managment: Goal: General experience of comfort will improve and/or be controlled Outcome: Progressing   Problem: Safety: Goal: Ability to remain free from injury will improve Outcome: Progressing

## 2024-03-10 DIAGNOSIS — G9341 Metabolic encephalopathy: Secondary | ICD-10-CM | POA: Diagnosis not present

## 2024-03-10 DIAGNOSIS — M869 Osteomyelitis, unspecified: Secondary | ICD-10-CM | POA: Diagnosis not present

## 2024-03-10 LAB — BASIC METABOLIC PANEL WITH GFR
Anion gap: 8 (ref 5–15)
BUN: 39 mg/dL — ABNORMAL HIGH (ref 8–23)
CO2: 24 mmol/L (ref 22–32)
Calcium: 8.9 mg/dL (ref 8.9–10.3)
Chloride: 104 mmol/L (ref 98–111)
Creatinine, Ser: 0.76 mg/dL (ref 0.61–1.24)
GFR, Estimated: >60 mL/min (ref >60)
Glucose, Bld: 133 mg/dL — ABNORMAL HIGH (ref 70–99)
Potassium: 4.1 mmol/L (ref 3.5–5.1)
Sodium: 136 mmol/L (ref 135–145)

## 2024-03-10 LAB — GLUCOSE, CAPILLARY
Glucose-Capillary: 119 mg/dL — ABNORMAL HIGH (ref 70–99)
Glucose-Capillary: 130 mg/dL — ABNORMAL HIGH (ref 70–99)
Glucose-Capillary: 135 mg/dL — ABNORMAL HIGH (ref 70–99)
Glucose-Capillary: 149 mg/dL — ABNORMAL HIGH (ref 70–99)

## 2024-03-10 LAB — CBC
HCT: 31.6 % — ABNORMAL LOW (ref 39.0–52.0)
Hemoglobin: 10.3 g/dL — ABNORMAL LOW (ref 13.0–17.0)
MCH: 30.9 pg (ref 26.0–34.0)
MCHC: 32.6 g/dL (ref 30.0–36.0)
MCV: 94.9 fL (ref 80.0–100.0)
Platelets: 545 K/uL — ABNORMAL HIGH (ref 150–400)
RBC: 3.33 MIL/uL — ABNORMAL LOW (ref 4.22–5.81)
RDW: 13.6 % (ref 11.5–15.5)
WBC: 8.6 K/uL (ref 4.0–10.5)
nRBC: 0 % (ref 0.0–0.2)

## 2024-03-10 LAB — MAGNESIUM: Magnesium: 2.2 mg/dL (ref 1.7–2.4)

## 2024-03-10 LAB — FLOW CYTOMETRY

## 2024-03-10 LAB — PHOSPHORUS: Phosphorus: 3.9 mg/dL (ref 2.5–4.6)

## 2024-03-10 NOTE — Progress Notes (Signed)
 Nuplazid  was not sufficiently covered by health insurance and left spouse with several hundret dollars of copay- spouse called and asked for any alternatives.  None of the listed alternatives is optimal for this patient. CD

## 2024-03-10 NOTE — Plan of Care (Signed)
 Wife tried to feel him, as his diet was changed to dysphagia 1.  Dr came up to speak with her at length about what is going on with pt.  Wife was very happy after getting all of her questions answered.    Problem: Health Behavior/Discharge Planning: Goal: Ability to manage health-related needs will improve Outcome: Progressing   Problem: Clinical Measurements: Goal: Diagnostic test results will improve Outcome: Progressing   Problem: Clinical Measurements: Goal: Respiratory complications will improve Outcome: Progressing   Problem: Clinical Measurements: Goal: Cardiovascular complication will be avoided Outcome: Progressing   Problem: Activity: Goal: Risk for activity intolerance will decrease Outcome: Progressing   Problem: Nutrition: Goal: Adequate nutrition will be maintained Outcome: Progressing

## 2024-03-10 NOTE — Progress Notes (Signed)
 IVT consult placed for IV placement. At the moment, no IV medications ordered. No imaging ordered. Secure chat sent to RN to inquire about need. No response at this time. Will complete consult. If patient needs change, new consult can be placed.   Jaymien Landin R Kalimah Capurro, RN

## 2024-03-10 NOTE — Progress Notes (Addendum)
 PROGRESS NOTE  Ernest Campbell FMW:[id number] DOB: January 22, 1951   PCP: Pcp, No  Patient is from: Home.  DOA: 02/25/2024 LOS: 12   Brief Narrative / Interim history: 73 year old M with PMH of parkinsonism, possible Lewy body dementia, anemia, sacral and left heel decubitus presenting with altered mental status since 02/21/2024 after he started Zyprexa  by his neurologist, and admitted with acute toxic and metabolic encephalopathy.  Workup in ED significant for lactic acidosis.  CT head and MRI brain without acute finding.  Left heel x-ray raise concern for osteomyelitis.  Neurology consulted. The next day, MRI of left heel suggested osteomyelitis.  Orthopedic surgery consulted but family declined surgery and chose nonoperative treatment.  Started on broad-spectrum antibiotics.  ID and palliative medicine consulted. Cortrack placed on 7/25 and started on TF.    Subjective: Patient asleep this morning but wakes up when his name is called.  Does not really respond to questions.    Objective: Vitals:   03/10/24 0057 03/10/24 0427 03/10/24 0500 03/10/24 0745  BP: (!) 96/59 (!) 106/59  (!) 99/55  Pulse: 82 77  78  Resp: 18 18  18   Temp: 99.3 F (37.4 C) 99 F (37.2 C)  98.2 F (36.8 C)  TempSrc: Oral   Oral  SpO2: 99% 100%  100%  Weight:   56.2 kg   Height:        Examination:  General appearance: Awake alert.  In no distress Resp: Clear to auscultation bilaterally.  Normal effort Cardio: S1-S2 is normal regular.  No S3-S4.  No rubs murmurs or bruit GI: Abdomen is soft.  Nontender nondistended.  Bowel sounds are present normal.  No masses organomegaly    Consultants:  Neurology Orthopedic surgery Infectious disease Palliative medicine  Procedures: None  Microbiology summarized: COVID-19, influenza and RSV PCR nonreactive Blood cultures NGTD  Assessment and plan:  Acute toxic and metabolic encephalopathy:  Patient with possible underlying Lewy body dementia.  Temporal  association of mental status change with recent initiation of Zyprexa . Was given one dose of xanax  for a PET scan but was not on it on a regular basis. CT head and MRI brain without acute finding.  EEG with moderate diffuse encephalopathy but no seizure or epileptiform discharge. UA did not suggest UTI.   Ammonia, B12, RPR, VBG and TSH unrevealing. Completed trial of high-dose thiamine  on 7/27. Patient has not shown any improvement in the last 2 weeks. Neurology was reconsulted at family's request.  Dr. Michaela saw the patient and discussed in detail with patient's family.  According to his note return back to his baseline may not happen.  He suspects underlying progressive neurodegenerative condition.  No further testing is thought to be necessary. Prognosis remains guarded. Palliative care is following as well. Patient remains stable without much improvement.  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  CRP elevated to 14.4.  ESR 39. Patient's wife refused amputation but requested ABI which showed mild disease.  Ongoing fever. -Initially evaluated by Dr. Harden.  -Wife requesting second opinion on 7/28.  Evaluated by Dr. Barton who recommends consulting plastics.  Seen by plastics who recommends continuing wound care and outpatient follow-up. ID was also consulted.  Patient has been on various different antibiotics including ceftriaxone  daptomycin  Augmentin  and doxycycline .   Currently on Augmentin  and doxycycline .  ID will follow patient in the outpatient setting.  ID recommends continuing antibiotics until 04/07/2024.  Recurrent fever:  Fever  of unknown origin.  Workup unremarkable so far including CT of the abdomen pelvis and Doppler studies.   Doppler studies without any DVT. Occasional low-grade fever noted.  WBC has been normal.  Continue to monitor.  Acute urinary retention:  Patient's wife reports history  of enlarged prostate.  No prior abdominal imaging.  He had multiple I&O caths.  Initially patient's wife declined Foley but then subsequently agreed. Started Cardura  1 mg daily per tube on 7/27.  Continue Foley catheter 7/26>> Voiding trial once his activity level has improved.  Goal of care discussion:  Palliative care is following.  Patient's wife has gone back and forth DNR.  Currently full code.    Normocytic anemia:  H&H seems to be at baseline.    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  Parkinsonism/possible Lewy body dementia:  Outpatient follow-up  Mild bilateral lower extremity PAD:  LDL 49. Low-dose aspirin  per tube  Hypokalemia/hypomagnesemia Continue to monitor periodically and supplement as indicated.  Hyponatremia: Resolved.  Dysphagia/severe malnutrition/failure to thrive Continue with tube feedings.  Speech therapy is following.  Full liquid diet with full aspiration precautions.   DVT prophylaxis: Lovenox  Code Status: Full code Disposition: To be determined.     Sch Meds:  Scheduled Meds:  amoxicillin -clavulanate  1 tablet Per Tube Q12H   aspirin   81 mg Per Tube Daily   carbidopa -levodopa   1 tablet Per Tube TID   Chlorhexidine  Gluconate Cloth  6 each Topical Daily   collagenase    Topical Daily   doxazosin   1 mg Per Tube Daily   doxycycline   100 mg Per Tube Q12H   enoxaparin  (LOVENOX ) injection  40 mg Subcutaneous QHS   free water   100 mL Per Tube Q4H   nutrition supplement (JUVEN)  1 packet Per Tube BID BM   mouth rinse  15 mL Mouth Rinse 4 times per day   thiamine   200 mg Per Tube Daily   Continuous Infusions:  feeding supplement (OSMOLITE 1.5 CAL) 60 mL/hr at 03/10/24 0600   PRN Meds:.acetaminophen  **OR** acetaminophen , albuterol , ketorolac , ondansetron  **OR** ondansetron  (ZOFRAN ) IV, mouth rinse, polyethylene glycol   CBC: Recent Labs  Lab 03/05/24 0356 03/07/24 0301 03/10/24 0444  WBC 9.2 8.9 8.6   HGB 10.7* 10.2* 10.3*  HCT 32.4* 30.9* 31.6*  MCV 94.5 94.5 94.9  PLT 437* 452* 545*   BMP &GFR Recent Labs  Lab 03/05/24 0356 03/07/24 0301 03/10/24 0444  NA 135 136 136  K 4.5 4.5 4.1  CL 103 103 104  CO2 25 26 24   GLUCOSE 141* 122* 133*  BUN 33* 45* 39*  CREATININE 0.83 0.86 0.76  CALCIUM  8.5* 8.7* 8.9  MG 2.1 2.2 2.2  PHOS  --   --  3.9   Estimated Creatinine Clearance: 67.3 mL/min (by C-G formula based on SCr of 0.76 mg/dL).  Liver & Pancreas: Recent Labs  Lab 03/05/24 0356 03/07/24 0301  AST 40 29  ALT 33 25  ALKPHOS 91 90  BILITOT 0.3 <0.2  PROT 6.0* 5.2*  ALBUMIN 2.4* 2.5*     Diabetic: Recent Labs  Lab 03/09/24 0607 03/09/24 1148 03/09/24 1802 03/10/24 0020 03/10/24 0619  GLUCAP 134* 128* 116* 130* 119*    Microbiology: Recent Results (from the past 240 hours)  Respiratory (~20 pathogens) panel by PCR     Status: None   Collection Time: 03/02/24 10:39 AM   Specimen: Nasopharyngeal Swab; Respiratory  Result Value Ref Range Status  Adenovirus NOT DETECTED NOT DETECTED Final   Coronavirus 229E NOT DETECTED NOT DETECTED Final    Comment: (NOTE) The Coronavirus on the Respiratory Panel, DOES NOT test for the novel  Coronavirus (2019 nCoV)    Coronavirus HKU1 NOT DETECTED NOT DETECTED Final   Coronavirus NL63 NOT DETECTED NOT DETECTED Final   Coronavirus OC43 NOT DETECTED NOT DETECTED Final   Metapneumovirus NOT DETECTED NOT DETECTED Final   Rhinovirus / Enterovirus NOT DETECTED NOT DETECTED Final   Influenza A NOT DETECTED NOT DETECTED Final   Influenza B NOT DETECTED NOT DETECTED Final   Parainfluenza Virus 1 NOT DETECTED NOT DETECTED Final   Parainfluenza Virus 2 NOT DETECTED NOT DETECTED Final   Parainfluenza Virus 3 NOT DETECTED NOT DETECTED Final   Parainfluenza Virus 4 NOT DETECTED NOT DETECTED Final   Respiratory Syncytial Virus NOT DETECTED NOT DETECTED Final   Bordetella pertussis NOT DETECTED NOT DETECTED Final   Bordetella  Parapertussis NOT DETECTED NOT DETECTED Final   Chlamydophila pneumoniae NOT DETECTED NOT DETECTED Final   Mycoplasma pneumoniae NOT DETECTED NOT DETECTED Final    Comment: Performed at [facility], [address] [place], [place] [address]    Radiology Studies: No results found.   Pavel Gadd  [facility]  If 7PM-7AM, please contact night-coverage [email or web address] 03/10/2024, 9:01 AM

## 2024-03-10 NOTE — Progress Notes (Signed)
 Speech Language Pathology Treatment: Dysphagia  Patient Details Name: Ernest Campbell MRN: [id number] DOB: Dec 04, 1950 Today's Date: 03/10/2024 Time: 8374-8352 SLP Time Calculation (min) (ACUTE ONLY): 22 min  Assessment / Plan / Recommendation Clinical Impression  Pt continues to be alert but does not follow commands. Oral deficits such as holding and anterior loss were reduced in frequency today but still occurred throughout the session. He much more actively initiated a labial seal around tspns of pudding and the cup of juice. Coughing followed sips of thin liquids intermittently throughout the consumption of 4 oz. Pt's spouse was present for session today and agrees with plan to advance pt's diet to include Dys 1 solids and continue thin liquids. Pt will require full supervision with careful hand feeding for all PO intake. While he was more attentive to POs today, question to support himself nutritionally given fluctuating mentation throughout this admission.  Discussed pt's presentation in great detail with his wife. She states she does not wish to proceed with instrumental testing (pt is not demonstrating readiness regardless) and would never want thickened liquids. Education was provided re: aspiration precautions and thorough oral care with plan to trial advanced textures next session as level of alertness allows. She states she will bring pt's dentures back before next SLP visit.    HPI HPI: Ernest Campbell is a 73 yo male presenting to ED 7/22 with AMS since 7/18 after starting Zyprexa . Admitted with acute toxic and metabolic encephalopathy, lactic acidosis, and concern for L heel osteomyelitis. MRI Brain negative. Most recently seen by SLP April 2025 with deficits related to oral holding and intermittent dry coughing with thin liquids but was ultimately discharged on Dys 2 diet with thin liquids. Previous swallow evaluation December 2024 revealed functional performance but with concern for  adequate intake in light of mentation. CT Chest 7/26 shows airway thickening in both lower lobes with some degree of airway plugging more notable in the L lower lobe, and bandlike regions of volume loss and mild airspace opacity in both lower lobes favoring atelectasis although aspiration pneumonitis is included in the differential diagnosis. 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, anemia, sacral and L heel decubitus      SLP Plan  Continue with current plan of care          Recommendations  Diet recommendations: Dysphagia 1 (puree);Thin liquid Liquids provided via: Teaspoon;Cup;Straw Medication Administration: Crushed with puree Supervision: Staff to assist with self feeding;Full supervision/cueing for compensatory strategies Compensations: Minimize environmental distractions;Slow rate;Small sips/bites;Monitor for anterior loss Postural Changes and/or Swallow Maneuvers: Seated upright 90 degrees;Upright 30-60 min after meal                  Oral care QID   Frequent or constant Supervision/Assistance Dysphagia, unspecified (R13.10)     Continue with current plan of care     Damien Blumenthal, M.A., CCC-SLP Speech Language Pathology, Acute Rehabilitation Services  Secure Chat preferred [phone]   03/10/2024, 5:04 PM

## 2024-03-10 NOTE — Progress Notes (Signed)
 Nutrition Follow-up  DOCUMENTATION CODES:  Severe malnutrition in context of social or environmental circumstances, Underweight  INTERVENTION:  Continue tube feeding via cortrak: Osmolite 1.5 at 60 ml/h (1440 ml per day) Free water : 100mL every 4 hours Provides 2160 kcal, 90 gm protein, 1097 ml free water  daily (TF+flush = free water ) 1 packet Juven BID, each packet provides 95 calories, 2.5 grams of protein (collagen) + micronutrients to support wound healing  NUTRITION DIAGNOSIS:  Severe Malnutrition related to social / environmental circumstances (frequent hospitalizations and SNF stay) as evidenced by severe muscle depletion, severe fat depletion, percent weight loss (14% x 6 months). - remains applicable   GOAL:  Patient will meet greater than or equal to 90% of their needs - remains applicable  MONITOR:  TF tolerance, Diet advancement, Labs, Weight trends, Skin  REASON FOR ASSESSMENT:  Consult Assessment of nutrition requirement/status  ASSESSMENT:  Pt with hx of prior CVA and dementia presented to ED with AMS after taking a new medication. Per wife, pt too sleepy to eat or drink for several days  7/22 - presented to ED 7/25 - cortrak tube placed 7/31 - thin liquids allowed, full liquid diet entered  Pt resting in bed at the time of assessment, no family at bedside. NT obtaining vitals. TF continues to infuse at goal rate and pt tolerating. New weight obtained yesterday, significantly different than weight last week and from admit weight. Visually, pt appears similar to previous assessments.   Will continue current TF regimen at this time and continue to monitor GOC discussions. Pt has had minimal PO intake of full liquid diet. SLP to re-evaluate today  Admit weight: 50.3 kg   Current weight: 56.2 kg  14% weight loss noted in the last ~6 months (08/2023-02/2024 which is severe)  Nutritionally Relevant Medications: Scheduled Meds:  amoxicillin -clavulanate  1  tablet Per Tube Q12H   doxazosin   1 mg Per Tube Daily   doxycycline   100 mg Per Tube Q12H   free water   100 mL Per Tube Q4H   JUVEN  1 packet Per Tube BID BM   thiamine   200 mg Per Tube Daily   Continuous Infusions:  feeding supplement (OSMOLITE 1.5 CAL) 60 mL/hr at 03/10/24 0600   PRN Meds: ondansetron , polyethylene glycol  Labs Reviewed: BUN 39 CBG ranges from 116-134 mg/dL over the last 24 hours  NUTRITION - FOCUSED PHYSICAL EXAM: Flowsheet Row Most Recent Value  Orbital Region Severe depletion  Upper Arm Region Severe depletion  Thoracic and Lumbar Region Moderate depletion  Buccal Region Severe depletion  Temple Region Severe depletion  Clavicle Bone Region Severe depletion  Clavicle and Acromion Bone Region Moderate depletion  Scapular Bone Region Severe depletion  Dorsal Hand Moderate depletion  Patellar Region Severe depletion  Anterior Thigh Region Severe depletion  Posterior Calf Region Severe depletion  Edema (RD Assessment) None  Hair Reviewed  Eyes Reviewed  Mouth Reviewed  Skin Reviewed  Nails Reviewed    Diet Order:   Diet Order             Diet full liquid Room service appropriate? No; Fluid consistency: Thin  Diet effective now                   EDUCATION NEEDS:  Education needs have been addressed  Skin:  Skin Assessment: Reviewed RN Assessment Stage 4:  - sacrum (7 x 4 x 0.4 cm) Stage 1: - Left hip (4.5 x 6 cm) Unstageable: - left heel (  2 x 1 x 0.5 cm)  Last BM:  8/2 - type 5  Height:  Ht Readings from Last 1 Encounters:  02/25/24 5' 5 (1.651 m)    Weight:  Wt Readings from Last 1 Encounters:  03/10/24 56.2 kg    Ideal Body Weight:  61.8 kg  BMI:  Body mass index is 20.63 kg/m.  Estimated Nutritional Needs:  Kcal:  1900-2100 kcal/d Protein:  85-100g/d Fluid:  2L/d    Ernest Campbell, RD, LDN, CNSC Registered Dietitian II Please reach out via secure chat

## 2024-03-11 DIAGNOSIS — F03C18 Unspecified dementia, severe, with other behavioral disturbance: Secondary | ICD-10-CM | POA: Diagnosis not present

## 2024-03-11 DIAGNOSIS — I739 Peripheral vascular disease, unspecified: Secondary | ICD-10-CM | POA: Diagnosis not present

## 2024-03-11 DIAGNOSIS — R40244 Other coma, without documented Glasgow coma scale score, or with partial score reported, unspecified time: Secondary | ICD-10-CM

## 2024-03-11 DIAGNOSIS — M86272 Subacute osteomyelitis, left ankle and foot: Secondary | ICD-10-CM | POA: Diagnosis not present

## 2024-03-11 LAB — GLUCOSE, CAPILLARY
Glucose-Capillary: 106 mg/dL — ABNORMAL HIGH (ref 70–99)
Glucose-Capillary: 112 mg/dL — ABNORMAL HIGH (ref 70–99)
Glucose-Capillary: 120 mg/dL — ABNORMAL HIGH (ref 70–99)
Glucose-Capillary: 83 mg/dL (ref 70–99)

## 2024-03-11 MED ORDER — SODIUM CHLORIDE 0.9 % IV BOLUS: 1000.0000 mL | Freq: Once | INTRAVENOUS | Status: DC

## 2024-03-11 NOTE — Progress Notes (Signed)
  Progress Note   Patient: Ernest Campbell FMW:[id number] DOB: 1951/02/17 DOA: 02/25/2024     13 DOS: the patient was seen and examined on 03/11/2024 at 8:36AM and 2:30PM      Brief hospital course: 73 year old M with PMH of parkinsonism, possible Lewy body dementia, anemia, sacral and left heel decubitus presenting with altered mental status since 02/21/2024 after he started Zyprexa  by his neurologist, and admitted with acute toxic and metabolic encephalopathy.  Workup in ED significant for lactic acidosis.  CT head and MRI brain without acute finding.  Left heel x-ray raise concern for osteomyelitis.  Neurology consulted. The next day, MRI of left heel suggested osteomyelitis.  Orthopedic surgery consulted but family declined surgery and chose nonoperative treatment.  Started on broad-spectrum antibiotics.  ID and palliative medicine consulted. Cortrack placed on 7/25 and started on TF.       Assessment and Plan: Encephalopathy  - Hold Zyprexa   Osteomyelitis -Continue Augmentin , doxycycline   Suspected lewy body dementia Parkinsonism -Continue Sinemet  -Continue aspirin  - Continue feeding supplements - Continue free water  - Continue Juven - Continue thiamine    Urinary retention -Discontinue Foley - Continue doxazosin  - Voiding trial  Normocytic anemia No bleeidng reported or observed       Subjective: No change to cognition.  No fever.  No respiratory distress, vomiting, pain complaints.     Physical Exam: BP (!) 91/57 (BP Location: Right Arm)   Pulse 86   Temp 98 F (36.7 C) (Oral)   Resp 12   Ht 5' 5 (1.651 m)   Wt 54 kg   SpO2 99%   BMI 19.80 kg/m   Thin frail elderly adult male, lying in bed RRR, no murmurs, no peripheral edema Respiratory rate normal, lungs clear without rales or wheezes Abdomen soft, no tenderness palpation Severe generalized weakness in all 4 extremities, mild contractures progressing, he occasionally makes eye contact, but does  not follow commands, he makes no intelligible verbalizations, mumbles hoarsely    Data Reviewed: Discussed with palliative care Hemoglobin stable BUN to creatinine ratio elevated Electrolytes and renal function normal  Family Communication: Wife at bedside    Disposition: Status is: Inpatient         Author: Lonni SHAUNNA Dalton, MD 03/11/2024 1:55 PM  For on call review [email or web address].

## 2024-03-11 NOTE — Progress Notes (Signed)
 Brief Nutrition Note  Pt able to have diet advanced 8/5 to DYS1. Per RN documentation, pt only consuming 5% of meals and yesterday RN reports that pt refused breakfast completely.   Now that pt is on a solid foods diet, will start kcal count to see if pt is taking in enough to sustain himself off of enteral nutrition. Placed envelope on door and discussed with MD and RN.   Niece and family friend in room at the time of visit. Discussed purpose of kcal count. Niece reports that she is very involved with pt's care and sees him almost daily. Attempted to feed him breakfast this AM but pt turning away and keeping mouth closed. States he was accepting of a few swabs  of juice. Pt consumed an applesauce container with his morning meds.   Will check in on tickets in the AM to determine how much PO pt is consuming.  Vernell Lukes, RD, LDN, CNSC Registered Dietitian II Please reach out via secure chat

## 2024-03-11 NOTE — Hospital Course (Signed)
 73 year old M with PMH of parkinsonism, possible Lewy body dementia, anemia, sacral and left heel decubitus presenting with altered mental status since 02/21/2024 after he started Zyprexa  by his neurologist, and admitted with acute toxic and metabolic encephalopathy.  Workup in ED significant for lactic acidosis.  CT head and MRI brain without acute finding.  Left heel x-ray raise concern for osteomyelitis.  Neurology consulted. The next day, MRI of left heel suggested osteomyelitis.  Orthopedic surgery consulted but family declined surgery and chose nonoperative treatment.  Started on broad-spectrum antibiotics.  ID and palliative medicine consulted. Cortrack placed on 7/25 and started on TF.

## 2024-03-11 NOTE — Evaluation (Signed)
 Physical Therapy Evaluation Patient Details Name: Ernest Campbell MRN: [id number] DOB: May 10, 1951 Today's Date: 03/11/2024  History of Present Illness  73 yo male presents to [facility] on 02/25/24 for AMS. MRI suggests L heel osteomyelitis. Family declined surgical intervention. PMH includes recent CVA, R hip ORIF 10/24/23, TBI, dementia, and parkinsonism.  Clinical Impression  Patient seen for PT evaluation. Supportive niece at the bedside reports patient was living at home with spouse and was pivoting to his wheelchair without assistance before coming to the hospital.  Today the patient is lethargic and needs stimulation to maintain alertness initially. He is unable to follow commands during this session. He does have sustained eye opening with sitting upright. Total assistance +2 person required to sit up on edge of bed. Total assistance +2 person also required for 2 standing bouts with knees blocked to prevent buckling. Both legs appear stiff and patient has intermittent grimacing with movement of legs. Anticipate the need for extensive caregiver assistance after this hospital stay. PT will continue to follow to maximize independence and decrease caregiver burden. Will try to coordinate PT sessions when family present for caregiver training and education as needed.       If plan is discharge home, recommend the following: Two people to help with walking and/or transfers;Two people to help with bathing/dressing/bathroom;Assistance with cooking/housework;Assistance with feeding;Direct supervision/assist for medications management;Direct supervision/assist for financial management;Assist for transportation;Help with stairs or ramp for entrance;Supervision due to cognitive status   Can travel by private vehicle        Equipment Recommendations Hospital bed (specialty air mattress due to wounds, ongoing assessment)  Recommendations for Other Services       Functional Status Assessment Patient has had a  recent decline in their functional status and demonstrates the ability to make significant improvements in function in a reasonable and predictable amount of time.     Precautions / Restrictions Precautions Precautions: Fall Recall of Precautions/Restrictions: Impaired Restrictions Weight Bearing Restrictions Per Provider Order: No      Mobility  Bed Mobility Overal bed mobility: Needs Assistance Bed Mobility: Rolling, Supine to Sit, Sit to Supine Rolling: Max assist   Supine to sit: Total assist, +2 for physical assistance Sit to supine: Total assist, +2 for physical assistance   General bed mobility comments: significant assistance required with all mobility efforts. patient does participate more with rolling to the right side but is unable to truly follow commands.    Transfers Overall transfer level: Needs assistance Equipment used: 2 person hand held assist Transfers: Sit to/from Stand Sit to Stand: Total assist, +2 physical assistance           General transfer comment: 2 standing bouts performed with total assistance required. faciliation and cues for anterior weight shifting and lift off. knees blocked to prevent buckling    Ambulation/Gait               General Gait Details: unable to at this time  Stairs            Wheelchair Mobility     Tilt Bed    Modified Rankin (Stroke Patients Only)       Balance Overall balance assessment: Needs assistance Sitting-balance support: Feet supported Sitting balance-Leahy Scale: Poor Sitting balance - Comments: external support required to maintain sitting balance. brief periods of SBA/CGA. loss of balance in all directions Postural control: Posterior lean, Right lateral lean, Left lateral lean Standing balance support: Bilateral upper extremity supported Standing balance-Leahy Scale: Zero Standing balance  comment: +2 person assistance required                             Pertinent  Vitals/Pain Pain Assessment Pain Assessment: PAINAD Breathing: normal Negative Vocalization: occasional moan/groan, low speech, negative/disapproving quality Facial Expression: smiling or inexpressive Body Language: tense, distressed pacing, fidgeting Consolability: no need to console PAINAD Score: 2    Home Living Family/patient expects to be discharged to:: Private residence Living Arrangements: Spouse/significant other;Other relatives Available Help at Discharge: Family;Available 24 hours/day Type of Home: House           Home Equipment: Cane - single point;Rollator (4 wheels);Rolling Walker (2 wheels);Grab bars - tub/shower;Hospital bed;Wheelchair - manual      Prior Function Prior Level of Function : Needs assist             Mobility Comments: squat pivot to wheelchair without physical assistance per niece ADLs Comments: intermittent assistance as needed     Extremity/Trunk Assessment   Upper Extremity Assessment Upper Extremity Assessment: Defer to OT evaluation    Lower Extremity Assessment Lower Extremity Assessment: RLE deficits/detail;LLE deficits/detail RLE Deficits / Details: rigidity with PROM. patient grimaces with movement at times. profound weakness throughout. unable to follow commands for formal MMT LLE Deficits / Details: rigidity with PROM. patient grimaces with movement at times. profound weakness throughout. unable to follow commands for formal MMT       Communication   Communication Communication: Impaired Factors Affecting Communication: Difficulty expressing self;Reduced clarity of speech    Cognition Arousal: Lethargic Behavior During Therapy: Flat affect   PT - Cognitive impairments: History of cognitive impairments, Difficult to assess Difficult to assess due to: Impaired communication, Level of arousal                     PT - Cognition Comments: patient is unable to follow commands despite multi modal cues. increased  alertness with mobility, sustained eye opening while sitting upright with assistance. speech attempts are mostly unintelligible Following commands: Impaired Following commands impaired:  (unable to follow commands)     Cueing Cueing Techniques: Verbal cues, Gestural cues, Tactile cues, Visual cues     General Comments General comments (skin integrity, edema, etc.): total assistance needed for pericare following a bowel movement. supportive niece present throughout session    Exercises     Assessment/Plan    PT Assessment Patient needs continued PT services  PT Problem List Decreased strength;Decreased range of motion;Decreased activity tolerance;Decreased balance;Decreased mobility;Decreased cognition;Decreased knowledge of use of DME;Decreased safety awareness;Decreased knowledge of precautions;Impaired tone       PT Treatment Interventions DME instruction;Functional mobility training;Therapeutic activities;Therapeutic exercise;Balance training;Neuromuscular re-education;Cognitive remediation;Patient/family education;Wheelchair mobility training    PT Goals (Current goals can be found in the Care Plan section)  Acute Rehab PT Goals Patient Stated Goal: niece goal is to return home, patient unable to participate with goal setting PT Goal Formulation: With family Time For Goal Achievement: 03/25/24 Potential to Achieve Goals: Poor    Frequency Min 1X/week     Co-evaluation PT/OT/SLP Co-Evaluation/Treatment: Yes Reason for Co-Treatment: Complexity of the patient's impairments (multi-system involvement) PT goals addressed during session: Mobility/safety with mobility;Balance OT goals addressed during session: ADL's and self-care;Strengthening/ROM       AM-PAC PT 6 Clicks Mobility  Outcome Measure Help needed turning from your back to your side while in a flat bed without using bedrails?: A Lot Help needed moving from lying on your  back to sitting on the side of a flat bed  without using bedrails?: Total Help needed moving to and from a bed to a chair (including a wheelchair)?: Total Help needed standing up from a chair using your arms (e.g., wheelchair or bedside chair)?: Total Help needed to walk in hospital room?: Total Help needed climbing 3-5 steps with a railing? : Total 6 Click Score: 7    End of Session Equipment Utilized During Treatment: Oxygen Activity Tolerance: Patient limited by fatigue;Patient limited by lethargy Patient left: in bed;with call bell/phone within reach;with nursing/sitter in room Nurse Communication: Mobility status PT Visit Diagnosis: Muscle weakness (generalized) (M62.81);Unsteadiness on feet (R26.81)    Time: 8894-8857 PT Time Calculation (min) (ACUTE ONLY): 37 min   Charges:   PT Evaluation $PT Eval Moderate Complexity: 1 Mod   PT General Charges $$ ACUTE PT VISIT: 1 Visit         Randine Essex, PT, MPT   Randine LULLA Essex 03/11/2024, 1:53 PM

## 2024-03-11 NOTE — Evaluation (Signed)
 Occupational Therapy Evaluation Patient Details Name: Ernest Campbell MRN: [id number] DOB: Nov 27, 1950 Today's Date: 03/11/2024   History of Present Illness   73 yo male presents to [facility] on 02/25/24 for AMS. MRI suggests L heel osteomyelitis. Family declined surgical intervention. PMH includes recent CVA, R hip ORIF 10/24/23, TBI, dementia, and parkinsonism.     Clinical Impressions Pt admitted for above, PTA the family reports that pt was able to pivot to w/c and had intermittent assist for ADLs but typically able to dress self independently. Pt currently presenting with impaired balance and cognition, not able to follow commands on eval and needing total A to total A+2 for ADLs and mobility. Pt without any initiation to wash face, using his L hand more functionally than his R. OT to continue following pt acutely to progress pt as able and reduce caregiver burden of care. Patient would benefit from post acute Home OT services to help maximize functional independence in natural environment, family adamant about return home vs rehab venues.      If plan is discharge home, recommend the following:   Two people to help with walking and/or transfers;Two people to help with bathing/dressing/bathroom;Assistance with cooking/housework;Direct supervision/assist for financial management;Assist for transportation;Help with stairs or ramp for entrance;Direct supervision/assist for medications management;Assistance with feeding;Supervision due to cognitive status     Functional Status Assessment   Patient has had a recent decline in their functional status and demonstrates the ability to make significant improvements in function in a reasonable and predictable amount of time.     Equipment Recommendations   Hospital bed;Other (comment) (air mattress overly on Hospital bed, will continue assessing DME needs on Fri)     Recommendations for Other Services         Precautions/Restrictions    Precautions Precautions: Fall Recall of Precautions/Restrictions: Impaired Restrictions Weight Bearing Restrictions Per Provider Order: No     Mobility Bed Mobility Overal bed mobility: Needs Assistance Bed Mobility: Rolling, Supine to Sit, Sit to Supine Rolling: Max assist   Supine to sit: Total assist, +2 for physical assistance Sit to supine: Total assist, +2 for physical assistance   General bed mobility comments: significant assistance required with all mobility efforts. patient does participate more with rolling to the right side but is unable to truly follow commands.    Transfers Overall transfer level: Needs assistance Equipment used: 2 person hand held assist Transfers: Sit to/from Stand Sit to Stand: Total assist, +2 physical assistance           General transfer comment: 2 standing bouts performed with total assistance required. faciliation and cues for anterior weight shifting and lift off. knees blocked to prevent buckling      Balance Overall balance assessment: Needs assistance Sitting-balance support: Feet supported Sitting balance-Leahy Scale: Poor Sitting balance - Comments: external support required to maintain sitting balance. brief periods of SBA/CGA. loss of balance in all directions Postural control: Posterior lean, Right lateral lean, Left lateral lean Standing balance support: Bilateral upper extremity supported Standing balance-Leahy Scale: Zero Standing balance comment: +2 person assistance required                           ADL either performed or assessed with clinical judgement   ADL  General ADL Comments: Pt not able to initate tasks, no effort despite hand over hand assist to promote initation. Total A to total A+2 for ADLs.     Vision   Additional Comments: limited assessment by cognition, full monitor/further assess     Perception         Praxis          Pertinent Vitals/Pain Pain Assessment Pain Assessment: PAINAD Breathing: normal Negative Vocalization: occasional moan/groan, low speech, negative/disapproving quality Facial Expression: smiling or inexpressive Body Language: relaxed Consolability: no need to console PAINAD Score: 1 Pain Intervention(s): Monitored during session     Extremity/Trunk Assessment Upper Extremity Assessment Upper Extremity Assessment: RUE deficits/detail;LUE deficits/detail;Difficult to assess due to impaired cognition RUE Deficits / Details: Rigidity with PROM, limited use but pt not following commands for testing. not able to squeze on command but will perform little elbow ROM LUE Deficits / Details: Pt using it more actively, bringing hand to nose. not FCs for testing.   Lower Extremity Assessment Lower Extremity Assessment: Difficult to assess due to impaired cognition RLE Deficits / Details: rigidity with PROM. patient grimaces with movement at times. profound weakness throughout. unable to follow commands for formal MMT LLE Deficits / Details: rigidity with PROM. patient grimaces with movement at times. profound weakness throughout. unable to follow commands for formal MMT       Communication Communication Communication: Impaired Factors Affecting Communication: Difficulty expressing self;Reduced clarity of speech   Cognition Arousal: Lethargic Behavior During Therapy: Flat affect Cognition: Difficult to assess Difficult to assess due to: Impaired communication                             Following commands: Impaired Following commands impaired:  (unable to follow commands)     Cueing  General Comments   Cueing Techniques: Verbal cues;Gestural cues;Tactile cues;Visual cues  total assistance needed for pericare following a bowel movement. supportive niece present throughout session   Exercises     Shoulder Instructions      Home Living Family/patient expects to be  discharged to:: Private residence Living Arrangements: Spouse/significant other;Other relatives Available Help at Discharge: Family;Available 24 hours/day Type of Home: House             Bathroom Shower/Tub: Walk-in shower;Tub/shower unit         Home Equipment: Cane - single point;Rollator (4 wheels);Rolling Walker (2 wheels);Grab bars - tub/shower;Hospital bed;Wheelchair - manual          Prior Functioning/Environment Prior Level of Function : Needs assist             Mobility Comments: squat pivot to wheelchair without physical assistance per niece ADLs Comments: intermittent assistance as needed    OT Problem List: Decreased cognition;Impaired balance (sitting and/or standing);Decreased strength   OT Treatment/Interventions: Self-care/ADL training;Patient/family education;Therapeutic exercise;Balance training;Therapeutic activities;Cognitive remediation/compensation      OT Goals(Current goals can be found in the care plan section)   Acute Rehab OT Goals Patient Stated Goal: To go back home OT Goal Formulation: With family Time For Goal Achievement: 03/25/24 Potential to Achieve Goals: Fair ADL Goals Pt Will Transfer to Toilet: bedside commode;with mod assist;squat pivot transfer (with care giver assist) Additional ADL Goal #1: Pt will complete bed mobility with min A in prep for seated EOB ADLs. Additional ADL Goal #2: Pt will demonstrate use of 1-3 gestural or verbal commands when prompted to demonstrate improving command following.   OT Frequency:  Min 1X/week    Co-evaluation PT/OT/SLP Co-Evaluation/Treatment: Yes Reason for Co-Treatment: Complexity of the patient's impairments (multi-system involvement) PT goals addressed during session: Mobility/safety with mobility;Balance OT goals addressed during session: ADL's and self-care;Strengthening/ROM      AM-PAC OT 6 Clicks Daily Activity     Outcome Measure Help from another person eating meals?:  Total Help from another person taking care of personal grooming?: Total Help from another person toileting, which includes using toliet, bedpan, or urinal?: Total Help from another person bathing (including washing, rinsing, drying)?: Total Help from another person to put on and taking off regular upper body clothing?: Total Help from another person to put on and taking off regular lower body clothing?: Total 6 Click Score: 6   End of Session Equipment Utilized During Treatment: Gait belt Nurse Communication: Mobility status  Activity Tolerance: Patient tolerated treatment well Patient left: with call bell/phone within reach;in bed;with nursing/sitter in room  OT Visit Diagnosis: Other abnormalities of gait and mobility (R26.89);Muscle weakness (generalized) (M62.81);Other symptoms and signs involving cognitive function                Time: 1106-1140 OT Time Calculation (min): 34 min Charges:  OT General Charges $OT Visit: 1 Visit OT Evaluation $OT Eval Moderate Complexity: 1 Mod  03/11/2024  AB, OTR/L  Acute Rehabilitation Services  Office: [phone]   NIEL PERETTI 03/11/2024, 3:33 PM

## 2024-03-11 NOTE — Progress Notes (Signed)
 Patient ID: Ernest Campbell, male   DOB: Sep 01, 1950, 73 y.o.   MRN: [id number]    Progress Note from the Palliative Medicine Team at [facility]   Patient Name: Ernest Campbell        Date: 03/11/2024 DOB: 26-Jun-1951  Age: 73 y.o. MRN#: [id number] Attending Physician: Jonel Lonni SQUIBB, * Primary Care Physician: Pcp, No Admit Date: 02/25/2024   Reason for Consultation/Follow-up   Establishing Goals of Care   HPI/ Brief Hospital Review  73 year old M with PMH of parkinsonism, possible Lewy body dementia, anemia, sacral and left heel decubitus presenting with altered mental status since 02/21/2024 after he started Zyprexa  by his neurologist, and admitted with acute toxic and metabolic encephalopathy.  Workup in ED significant for lactic acidosis.  CT head and MRI brain without acute finding.  MRI of left heel significant for  osteomyelitis.  Orthopedic surgery consulted but family declined surgery and chose nonoperative treatment.  Started on broad-spectrum antibiotics.  ID and palliative medicine consulted. Neurology has been consulted   Cortrack placed on 7/25 and started on TF.  Patient is more awake today, speech is garbled.  Family face treatment option decisions, advanced directive decisions and anticipatory care needs.   Subjective  Extensive chart review has been completed prior to meeting with patient/family  including labs, vital signs, imaging, progress/consult notes, orders, medications and available advance directive documents.    This NP assessed patient at the bedside as a follow up for palliative medicine needs and emotional support.  Wife  at bedside.  I met today at bedside with patient's wife, Dr. Jonel and Curtistine with Occupational Therapy.  Ongoing conversation regarding current medical situation specifically regarding patient's frailty and overall failure to thrive, ongoing swallow issues, left heel osteomyelitis and underlying Parkinson's  disease.  Attempted to present wife with best case scenario versus worst-case scenario and the importance of contemplating differing treatment paths.  We discussed issues related to dysphagia, mobility and cognition  Wife verbalizes that patient will never have an amputation or a permanent /PEG feeding tube. Education offered on concept specific to comfort feeds with known risk of aspiration   Education offered on the difference between a full medical support path attempting to prolong life versus a comfort supportive path allowing for nature to take its course.    For now Mrs.Pauwels  is open to all offered and available medical interventions to prolong life.  She remains hopeful for improvement.  Ultimately her hope is that her husband could return home.  Dr. Jonel discussed with wife revisiting this discussion again on Friday  We explored patient's increased nursing care needs in the home.  Education offered on hospice benefit  Education offered today regarding  the importance of continued conversation with medical providers regarding overall plan of care and treatment options,  ensuring decisions are within the context of the patients values and GOCs.  Questions and concerns addressed   Discussed with primary team and nursing staff  PMT will continue to support holistically   Time: 50  minutes  Detailed review of medical records ( labs, imaging, vital signs), medically appropriate exam ( MS, skin, cardiac,  resp)   discussed with treatment team, counseling and education to patient, family, staff, documenting clinical information, medication management, coordination of care    Ronal Plants NP  Palliative Medicine Team Team Phone # [phone] Pager [phone]

## 2024-03-12 DIAGNOSIS — R40244 Other coma, without documented Glasgow coma scale score, or with partial score reported, unspecified time: Secondary | ICD-10-CM | POA: Diagnosis not present

## 2024-03-12 LAB — COMPREHENSIVE METABOLIC PANEL WITH GFR
ALT: 21 U/L (ref 0–44)
AST: 19 U/L (ref 15–41)
Albumin: 2.8 g/dL — ABNORMAL LOW (ref 3.5–5.0)
Alkaline Phosphatase: 111 U/L (ref 38–126)
Anion gap: 8 (ref 5–15)
BUN: 38 mg/dL — ABNORMAL HIGH (ref 8–23)
CO2: 24 mmol/L (ref 22–32)
Calcium: 8.9 mg/dL (ref 8.9–10.3)
Chloride: 102 mmol/L (ref 98–111)
Creatinine, Ser: 0.79 mg/dL (ref 0.61–1.24)
GFR, Estimated: >60 mL/min (ref >60)
Glucose, Bld: 129 mg/dL — ABNORMAL HIGH (ref 70–99)
Potassium: 4.5 mmol/L (ref 3.5–5.1)
Sodium: 134 mmol/L — ABNORMAL LOW (ref 135–145)
Total Bilirubin: 0.4 mg/dL (ref 0.0–1.2)
Total Protein: 6.3 g/dL — ABNORMAL LOW (ref 6.5–8.1)

## 2024-03-12 LAB — CBC
HCT: 33.2 % — ABNORMAL LOW (ref 39.0–52.0)
Hemoglobin: 10.8 g/dL — ABNORMAL LOW (ref 13.0–17.0)
MCH: 31.3 pg (ref 26.0–34.0)
MCHC: 32.5 g/dL (ref 30.0–36.0)
MCV: 96.2 fL (ref 80.0–100.0)
Platelets: 558 K/uL — ABNORMAL HIGH (ref 150–400)
RBC: 3.45 MIL/uL — ABNORMAL LOW (ref 4.22–5.81)
RDW: 13.3 % (ref 11.5–15.5)
WBC: 9 K/uL (ref 4.0–10.5)
nRBC: 0 % (ref 0.0–0.2)

## 2024-03-12 LAB — GLUCOSE, CAPILLARY
Glucose-Capillary: 107 mg/dL — ABNORMAL HIGH (ref 70–99)
Glucose-Capillary: 147 mg/dL — ABNORMAL HIGH (ref 70–99)
Glucose-Capillary: 110 mg/dL — ABNORMAL HIGH (ref 70–99)

## 2024-03-12 MED ORDER — FREE WATER
100.0000 mL | Freq: Four times a day (QID) | Status: DC
  Administered 2024-03-12 – 2024-03-13 (×4): 100 mL

## 2024-03-12 NOTE — Progress Notes (Signed)
 Calorie Count Note  48-hour calorie count ordered.  Wife present at bedside this AM. Reviewed intake over the last 24 hours and results seen below. Noted that pt with minimal intake of breakfast and lunch but did eat well when wife was present at dinner. Also noted that breakfast tray still in room from 8/7 with very good intake.   Will monitor progress of PO intake goals and if pt's intake is consistent with meeting ~50% of his needs, will adjust tube feeding to nocturnal on 8/8 and begin weaning enteral feeds.   Pt does appear to consume more when wife is present and feeding him. Also discussed ordering with wife and being able to select foods that pt likes to customize his meals a little more. Menu was provided.  Diet: DYS 1, thin liquids  Estimated Nutritional Needs:  Kcal:  1900-2100 kcal/d Protein:  85-100g/d Fluid:  2L/d  8/6 Breakfast: 60 kcal, 0g protein (only had applesauce with meds) 8/6 Lunch: 198 kcal, 8g protein 8/6 Dinner: 609 kcal, 31g protein (wife fed)  Total intake: 867 kcal (46% of minimum estimated needs)  39 protein (46% of minimum estimated needs)  NUTRITION DIAGNOSIS:  Severe Malnutrition related to social / environmental circumstances (frequent hospitalizations and SNF stay) as evidenced by severe muscle depletion, severe fat depletion, percent weight loss (14% x 6 months). - remains applicable    GOAL:  Patient will meet greater than or equal to 90% of their needs - remains applicable  INTERVENTION:  Continue current diet as recommended per SLP Feeding assistance Continue tube feeding via cortrak: Osmolite 1.5 at 60 ml/h (1440 ml per day) Free water : 100mL every 6 hours Provides 2160 kcal, 90 gm protein, 1097 ml free water  daily (TF+flush = free water ) 1 packet Juven BID, each packet provides 95 calories, 2.5 grams of protein (collagen) + micronutrients to support wound healing    Vernell Lukes, RD, LDN, CNSC Registered Dietitian  II Please reach out via secure chat

## 2024-03-12 NOTE — Progress Notes (Signed)
 Speech Language Pathology Treatment: Dysphagia  Patient Details Name: Ernest Campbell MRN: [id number] DOB: 05/25/1951 Today's Date: 03/12/2024 Time: 0910-0930 SLP Time Calculation (min) (ACUTE ONLY): 20 min  Assessment / Plan / Recommendation Clinical Impression  Pt awake, nonverbal. Oral care completed with suction. Pt closed lips on suction wand and could not leave his mouth open per request. Following oral care, RN assisted with repositioning pt upright. Pt accepted boluses of puree, and appeared to tolerate trials well. Cup sips of thin liquid were tolerated with minimal anterior leakage. No overt s/s aspiration observed during PO trials. No family present at this time, however, RN reports pt had good PO intake yesterday, especially as the day/alertness progressed.  Advanced trials not assessed at this time, as dentures were not readily available, and pt rolled/slid in the bed making positioning unsafe for continued PO trials. Will continue to follow acutely to assess readiness for advanced solids.   HPI HPI: BURREL LEGRAND is a 73 yo male presenting to ED 7/22 with AMS since 7/18 after starting Zyprexa . Admitted with acute toxic and metabolic encephalopathy, lactic acidosis, and concern for L heel osteomyelitis. MRI Brain negative. Most recently seen by SLP April 2025 with deficits related to oral holding and intermittent dry coughing with thin liquids but was ultimately discharged on Dys 2 diet with thin liquids. Previous swallow evaluation December 2024 revealed functional performance but with concern for adequate intake in light of mentation. CT Chest 7/26 shows airway thickening in both lower lobes with some degree of airway plugging more notable in the L lower lobe, and bandlike regions of volume loss and mild airspace opacity in both lower lobes favoring atelectasis although aspiration pneumonitis is included in the differential diagnosis. 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,  anemia, sacral and L heel decubitus      SLP Plan  Continue with current plan of care          Recommendations  Diet recommendations: Dysphagia 1 (puree);Thin liquid Liquids provided via: Teaspoon;Cup;Straw Medication Administration: Crushed with puree Supervision: Full supervision/cueing for compensatory strategies Compensations: Minimize environmental distractions;Slow rate;Small sips/bites;Monitor for anterior loss Postural Changes and/or Swallow Maneuvers: Seated upright 90 degrees;Upright 30-60 min after meal           Oral care QID   Frequent or constant Supervision/Assistance Dysphagia, unspecified (R13.10)     Continue with current plan of care    Tyriana Helmkamp B. Dory, MSP, CCC-SLP Speech Language Pathologist Office: [phone]  Dory Caprice Daring 03/12/2024, 9:33 AM

## 2024-03-12 NOTE — TOC Progression Note (Signed)
 Transition of Care [facility]) - Progression Note    Patient Details  Name: Ernest Campbell MRN: [id number] Date of Birth: June 23, 1951  [facility] CM/SW Contact  [name], RN Phone Number: 03/12/2024, 1:54 PM  Clinical Narrative:     Plan is for home when he is medically ready. Still with cortrak but eating well with wife's assistance.  Wife requesting a Steady for home. CM will send this to [employer] once order is signed. Wife states they have all other needed DME at home.  Pt is already active with [facility]. CM will make sure resumption orders are done prior to d/c.  IP Care Management following.  Expected Discharge Plan: Home w Home Health Services                 Expected Discharge Plan and Services                                               Social Drivers of Health (SDOH) Interventions SDOH Screenings   Food Insecurity: Patient Unable To Answer (02/27/2024)  Housing: Low Risk  (03/11/2024)  Transportation Needs: Patient Unable To Answer (02/27/2024)  Utilities: Patient Unable To Answer (02/27/2024)  Financial Resource Strain: Low Risk  (10/20/2022)   Received from [facility]  Physical Activity: Unknown (10/20/2022)   Received from [facility]  Social Connections: Unknown (02/27/2024)  Stress: Patient Declined (10/20/2022)   Received from [facility]  Tobacco Use: High Risk (02/25/2024)    Readmission Risk Interventions    11/08/2023    1:14 PM  Readmission Risk Prevention Plan  Transportation Screening   Medication Review (RN Care Manager)   PCP or Specialist appointment within 3-5 days of discharge   PCP/Specialist Appt Not Complete comments   HRI or Home Care Consult   SW Recovery Care/Counseling Consult   Palliative Care Screening   Skilled Nursing Facility      Information is confidential and restricted. Go to Review Flowsheets to unlock data.

## 2024-03-12 NOTE — Progress Notes (Signed)
  Progress Note   Patient: Ernest Campbell FMW:[id number] DOB: 12-26-1950 DOA: 02/25/2024     14 DOS: the patient was seen and examined on 03/12/2024        Brief hospital course: 73 year old M with PMH of parkinsonism, possible Lewy body dementia, anemia, sacral and left heel decubitus presenting with altered mental status since 02/21/2024 after he started Zyprexa  by his neurologist, and admitted with acute toxic and metabolic encephalopathy.  Workup in ED significant for lactic acidosis.  CT head and MRI brain without acute finding.  Left heel x-ray raise concern for osteomyelitis.  Neurology consulted. The next day, MRI of left heel suggested osteomyelitis.  Orthopedic surgery consulted but family declined surgery and chose nonoperative treatment.  Started on broad-spectrum antibiotics.  ID and palliative medicine consulted. Cortrack placed on 7/25 and started on TF.       Assessment and Plan:  Encephalopathy This appears to be multifactorial.  Trajectory seems to be gradually improving, but very slowly, and endpoint is unclear if he focal recovered to a point where he can stand, pivot, eat, or interact appropriately. - Hold Zyprexa  - Delirium precautions  Osteomyelitis of the right calcaneus - Continue Augmentin  and doxycycline  - Outpatient follow-up with ID  Suspected Lewy body dementia Parkinsonism - Continue Sinemet , aspirin , feeding supplements, free water , Juven, thiamine   Urinary retention - Continue doxazosin   Normocytic anemia Hemoglobin stable, no bleeding reported or observed        Subjective: No change in cognition.  No fever.  No respiratory distress, no pain complaints, no vomiting.     Physical Exam: BP 105/77 (BP Location: Right Arm)   Pulse 85   Temp 97.9 F (36.6 C) (Oral)   Resp 20   Ht 5' 5 (1.651 m)   Wt 54 kg   SpO2 100%   BMI 19.80 kg/m   Thin frail elderly adult male, lying in bed, does not respond to my voice or touch RRR, no  murmurs, no peripheral edema Respiratory rate normal, lungs clear without rales or wheezes Abdomen soft, no grimace to palpation Generalized weakness in all 4 extremities, does not make eye contact, does not follow commands    Data Reviewed: Basic metabolic panel shows normal electrolytes and renal function, elevated BUN to creatinine ratio CBC shows stable anemia     Disposition: Status is: Inpatient         Author: Lonni SHAUNNA Dalton, MD 03/12/2024 11:00 AM  For on call review [email or web address].

## 2024-03-12 NOTE — Progress Notes (Addendum)
    Durable Medical Equipment  (From admission, onward)           Start     Ordered   03/12/24 1115  For home use only DME Other see comment  Once       Comments: Steady lift  Question:  Length of Need  Answer:  6 Months   03/12/24 1115             Patient requires a steady lift as he is unable to stand and transfer independently. His spouse is also unable to assist him in transfers without the use of the steady.

## 2024-03-12 NOTE — Progress Notes (Signed)
 Physical Therapy Treatment Patient Details Name: Ernest Campbell MRN: [id number] DOB: July 06, 1951 Today's Date: 03/12/2024   History of Present Illness 73 yo male presents to [facility] on 02/25/24 for AMS. MRI suggests L heel osteomyelitis. Family declined surgical intervention. PMH includes recent CVA, R hip ORIF 10/24/23, TBI, dementia, and parkinsonism.   PT Comments  Pt in bed upon arrival with wife present and agreeable to PT session. Pt intermittently following commands with incomprehensible speech. Pt required TotalAx2 to MaxAx2 for all mobility this session. Attempted x3 stands with MaxAx2 and use of Stedy with pt able to achieve upright posture 1/3 trials. Pt fatigues quickly with pt only able to slightly clear bottom on the last stand attempt. Recommending use of hoyer lift at home as pt is not able to safely transfer with Stedy at this time. Discussed using hoyer lift and having family perform BLE PROM to prevent further stiffness and contractures. As family is requesting to return home, continue to recommend HHPT. Acute PT to follow.     If plan is discharge home, recommend the following: Two people to help with walking and/or transfers;Two people to help with bathing/dressing/bathroom;Assistance with cooking/housework;Assistance with feeding;Direct supervision/assist for medications management;Direct supervision/assist for financial management;Assist for transportation;Help with stairs or ramp for entrance;Supervision due to cognitive status   Can travel by private vehicle      No  Equipment Recommendations  Hospital bed;Hoyer lift (specialty air mattress due to wounds)       Precautions / Restrictions Precautions Precautions: Fall Recall of Precautions/Restrictions: Impaired Restrictions Weight Bearing Restrictions Per Provider Order: No     Mobility  Bed Mobility Overal bed mobility: Needs Assistance Bed Mobility: Rolling, Sidelying to Sit, Sit to Supine Rolling: Total  assist Sidelying to sit: Total assist, +2 for physical assistance, +2 for safety/equipment   Sit to supine: Total assist, +2 for physical assistance, +2 for safety/equipment   General bed mobility comments: unable to assist with mobility, TotalAx2    Transfers Overall transfer level: Needs assistance Equipment used: Ambulation equipment used Transfers: Sit to/from Stand Sit to Stand: Max assist, +2 physical assistance, +2 safety/equipment, Via lift equipment    General transfer comment: x3 stand attempts with stedy and MaxAx2. Assist needed to reach for stedy handle with pt able to grip. Forward flexed posture with cues for hip extension. Only able to stand tall enough to put Stedy flaps down 1/3 trials. Fatigue with increased repetitions Transfer via Lift Equipment: Stedy  Ambulation/Gait  General Gait Details: unable to at this time     Balance Overall balance assessment: Needs assistance Sitting-balance support: Feet supported Sitting balance-Leahy Scale: Poor Sitting balance - Comments: ModA for seated balance with LOB in all directions. No righting responses Postural control: Posterior lean, Right lateral lean, Left lateral lean Standing balance support: Bilateral upper extremity supported, During functional activity, Reliant on assistive device for balance Standing balance-Leahy Scale: Zero Standing balance comment: unable to stand fully upright with MaxAx2 and [employer] Communication Communication: Impaired Factors Affecting Communication: Difficulty expressing self;Reduced clarity of speech  Cognition Arousal: Alert Behavior During Therapy: Flat affect   PT - Cognitive impairments: History of cognitive impairments, Awareness, Memory, Attention, Initiation, Sequencing, Problem solving, Safety/Judgement Difficult to assess due to: Impaired communication    PT - Cognition Comments: speech attempts mostly unintelligible with pt following commands <25% of  the time. Following commands: Impaired Following commands impaired: Follows one step commands inconsistently    Cueing Cueing Techniques: Verbal  cues, Gestural cues, Tactile cues, Visual cues     General Comments General comments (skin integrity, edema, etc.): Wife in room and supportive during session. Discussed perform B LE PROM and changing positions every two hours. Positioned towards the left with pillows supporting      Pertinent Vitals/Pain Pain Assessment Pain Assessment: Faces Faces Pain Scale: Hurts little more Pain Location: grimaces with movement, unable to localize Pain Descriptors / Indicators: Discomfort Pain Intervention(s): Limited activity within patient's tolerance, Monitored during session, Repositioned     PT Goals (current goals can now be found in the care plan section) Acute Rehab PT Goals PT Goal Formulation: With family Time For Goal Achievement: 03/25/24 Potential to Achieve Goals: Fair Progress towards PT goals: Progressing toward goals    Frequency    Min 1X/week       AM-PAC PT 6 Clicks Mobility   Outcome Measure  Help needed turning from your back to your side while in a flat bed without using bedrails?: Total Help needed moving from lying on your back to sitting on the side of a flat bed without using bedrails?: Total Help needed moving to and from a bed to a chair (including a wheelchair)?: Total Help needed standing up from a chair using your arms (e.g., wheelchair or bedside chair)?: Total Help needed to walk in hospital room?: Total Help needed climbing 3-5 steps with a railing? : Total 6 Click Score: 6    End of Session Equipment Utilized During Treatment: Gait belt Activity Tolerance: Patient limited by fatigue;Patient limited by lethargy Patient left: in bed;with call bell/phone within reach;with bed alarm set;with family/visitor present Nurse Communication: Mobility status PT Visit Diagnosis: Muscle weakness (generalized)  (M62.81);Unsteadiness on feet (R26.81)     Time: 1036-1100 PT Time Calculation (min) (ACUTE ONLY): 24 min  Charges:    $Therapeutic Exercise: 8-22 mins $Therapeutic Activity: 8-22 mins PT General Charges $$ ACUTE PT VISIT: 1 Visit                    Kate ORN, PT, DPT Secure Chat Preferred  Rehab Office [phone]   Kate BRAVO Ernest Campbell 03/12/2024, 2:15 PM

## 2024-03-12 NOTE — Plan of Care (Signed)
  Problem: Education: Goal: Knowledge of General Education information will improve Description: Including pain rating scale, medication(s)/side effects and non-pharmacologic comfort measures Outcome: Not Progressing   Problem: Health Behavior/Discharge Planning: Goal: Ability to manage health-related needs will improve Outcome: Not Progressing   Problem: Clinical Measurements: Goal: Ability to maintain clinical measurements within normal limits will improve Outcome: Progressing Goal: Will remain free from infection Outcome: Progressing Goal: Diagnostic test results will improve Outcome: Progressing Goal: Respiratory complications will improve Outcome: Progressing Goal: Cardiovascular complication will be avoided Outcome: Progressing   Problem: Activity: Goal: Risk for activity intolerance will decrease Outcome: Progressing   Problem: Nutrition: Goal: Adequate nutrition will be maintained Outcome: Progressing   Problem: Coping: Goal: Level of anxiety will decrease Outcome: Progressing   Problem: Elimination: Goal: Will not experience complications related to bowel motility Outcome: Progressing Goal: Will not experience complications related to urinary retention Outcome: Progressing   Problem: Pain Managment: Goal: General experience of comfort will improve and/or be controlled Outcome: Progressing   Problem: Safety: Goal: Ability to remain free from injury will improve Outcome: Progressing

## 2024-03-13 DIAGNOSIS — R40244 Other coma, without documented Glasgow coma scale score, or with partial score reported, unspecified time: Secondary | ICD-10-CM | POA: Diagnosis not present

## 2024-03-13 LAB — COMPREHENSIVE METABOLIC PANEL WITH GFR
ALT: 17 U/L (ref 0–44)
AST: 17 U/L (ref 15–41)
Albumin: 2.7 g/dL — ABNORMAL LOW (ref 3.5–5.0)
Alkaline Phosphatase: 114 U/L (ref 38–126)
Anion gap: 10 (ref 5–15)
BUN: 42 mg/dL — ABNORMAL HIGH (ref 8–23)
CO2: 24 mmol/L (ref 22–32)
Calcium: 8.9 mg/dL (ref 8.9–10.3)
Chloride: 105 mmol/L (ref 98–111)
Creatinine, Ser: 0.89 mg/dL (ref 0.61–1.24)
GFR, Estimated: >60 mL/min (ref >60)
Glucose, Bld: 112 mg/dL — ABNORMAL HIGH (ref 70–99)
Potassium: 4.4 mmol/L (ref 3.5–5.1)
Sodium: 139 mmol/L (ref 135–145)
Total Bilirubin: 0.4 mg/dL (ref 0.0–1.2)
Total Protein: 6 g/dL — ABNORMAL LOW (ref 6.5–8.1)

## 2024-03-13 LAB — GLUCOSE, CAPILLARY
Glucose-Capillary: 117 mg/dL — ABNORMAL HIGH (ref 70–99)
Glucose-Capillary: 121 mg/dL — ABNORMAL HIGH (ref 70–99)

## 2024-03-13 LAB — CBC
HCT: 30.5 % — ABNORMAL LOW (ref 39.0–52.0)
Hemoglobin: 10 g/dL — ABNORMAL LOW (ref 13.0–17.0)
MCH: 31.2 pg (ref 26.0–34.0)
MCHC: 32.8 g/dL (ref 30.0–36.0)
MCV: 95 fL (ref 80.0–100.0)
Platelets: 540 K/uL — ABNORMAL HIGH (ref 150–400)
RBC: 3.21 MIL/uL — ABNORMAL LOW (ref 4.22–5.81)
RDW: 13.3 % (ref 11.5–15.5)
WBC: 7.5 K/uL (ref 4.0–10.5)
nRBC: 0 % (ref 0.0–0.2)

## 2024-03-13 MED ORDER — ENSURE PLUS HIGH PROTEIN PO LIQD
237.0000 mL | Freq: Three times a day (TID) | ORAL | Status: DC
  Administered 2024-03-13 – 2024-03-16 (×9): 237 mL via ORAL

## 2024-03-13 MED ORDER — OSMOLITE 1.5 CAL PO LIQD: 1000.0000 mL | ORAL | Status: DC

## 2024-03-13 MED ORDER — DOXYCYCLINE HYCLATE 100 MG PO TABS
100.0000 mg | ORAL_TABLET | Freq: Two times a day (BID) | ORAL | Status: DC
  Administered 2024-03-13 – 2024-03-16 (×7): 100 mg via ORAL
  Filled 2024-03-13 (×6): qty 1

## 2024-03-13 MED ORDER — THIAMINE MONONITRATE 100 MG PO TABS
200.0000 mg | ORAL_TABLET | Freq: Every day | ORAL | Status: DC
  Administered 2024-03-14 – 2024-03-16 (×4): 200 mg via ORAL
  Filled 2024-03-13 (×3): qty 2

## 2024-03-13 MED ORDER — AMOXICILLIN-POT CLAVULANATE 875-125 MG PO TABS
1.0000 | ORAL_TABLET | Freq: Two times a day (BID) | ORAL | Status: DC
  Administered 2024-03-13 – 2024-03-16 (×7): 1 via ORAL
  Filled 2024-03-13 (×6): qty 1

## 2024-03-13 MED ORDER — ASPIRIN 81 MG PO CHEW
81.0000 mg | CHEWABLE_TABLET | Freq: Every day | ORAL | Status: DC
  Administered 2024-03-14 – 2024-03-16 (×4): 81 mg via ORAL
  Filled 2024-03-13 (×3): qty 1

## 2024-03-13 MED ORDER — PROSOURCE PLUS PO LIQD
30.0000 mL | Freq: Two times a day (BID) | ORAL | Status: DC
  Administered 2024-03-13 – 2024-03-16 (×7): 30 mL via ORAL
  Filled 2024-03-13 (×6): qty 30

## 2024-03-13 MED ORDER — DOXAZOSIN MESYLATE 1 MG PO TABS
1.0000 mg | ORAL_TABLET | Freq: Every day | ORAL | Status: DC
  Administered 2024-03-14 – 2024-03-16 (×4): 1 mg via ORAL
  Filled 2024-03-13 (×3): qty 1

## 2024-03-13 MED ORDER — CARBIDOPA-LEVODOPA 25-100 MG PO TABS
1.0000 | ORAL_TABLET | Freq: Three times a day (TID) | ORAL | Status: DC
  Administered 2024-03-13 – 2024-03-16 (×10): 1 via ORAL
  Filled 2024-03-13 (×9): qty 1

## 2024-03-13 NOTE — Progress Notes (Signed)
 Calorie Count Note  48-hour calorie count ordered.  RN and wife at bedside, reporting that pt is eating well. Per documentation, pt PO intake improved significantly from day prior. RD will adjust tube feeds to nocturnal and will continue to monitor PO intake. Plan to wean enteral nutrition as able id PO intake remains adequate.    Diet: Dysphagia 1, thin liquids  Estimated Nutritional Needs:  Kcal:  1900-2100 kcal/d Protein:  85-100g/d Fluid:  2L/d  8/07 Breakfast: 551 calories, 33 gm protein  8/07 Lunch: 807 calories, 43 gm protein 8/07 Dinner: 821 calories, 43 gm protein  Total intake: 2179 kcal (>100% of minimum estimated needs)  119 protein (>100% of minimum estimated needs)  NUTRITION DIAGNOSIS:  Severe Malnutrition related to social / environmental circumstances (frequent hospitalizations and SNF stay) as evidenced by severe muscle depletion, severe fat depletion, percent weight loss (14% x 6 months). - remains applicable    GOAL:  Patient will meet greater than or equal to 90% of their needs - remains applicable   INTERVENTION:  Continue current diet as recommended per SLP Feeding assistance Transition to nocturnal tube feeding via cortrak: Osmolite 1.5 at 60 ml/h x 12 hours from 1800 to 0600 (720 ml per day) Free water : 100mL every 6 hours Provides 1080 kcal, 45 gm protein, 549 ml free water  daily (TF+flush = 949 mL free water ) 1 packet Juven BID, each packet provides 95 calories, 2.5 grams of protein (collagen) + micronutrients to support wound healing    Nestora Glatter RD, LDN Clinical Dietitian

## 2024-03-13 NOTE — Plan of Care (Signed)
  Problem: Education: Goal: Knowledge of General Education information will improve Description: Including pain rating scale, medication(s)/side effects and non-pharmacologic comfort measures Outcome: Not Progressing   Problem: Health Behavior/Discharge Planning: Goal: Ability to manage health-related needs will improve Outcome: Not Progressing   Problem: Clinical Measurements: Goal: Ability to maintain clinical measurements within normal limits will improve Outcome: Progressing Goal: Will remain free from infection Outcome: Progressing Goal: Diagnostic test results will improve Outcome: Progressing Goal: Respiratory complications will improve Outcome: Progressing Goal: Cardiovascular complication will be avoided Outcome: Progressing   Problem: Nutrition: Goal: Adequate nutrition will be maintained Outcome: Progressing   Problem: Coping: Goal: Level of anxiety will decrease Outcome: Progressing   Problem: Elimination: Goal: Will not experience complications related to bowel motility Outcome: Progressing Goal: Will not experience complications related to urinary retention Outcome: Progressing   Problem: Pain Managment: Goal: General experience of comfort will improve and/or be controlled Outcome: Progressing   Problem: Safety: Goal: Ability to remain free from injury will improve Outcome: Progressing   Problem: Skin Integrity: Goal: Risk for impaired skin integrity will decrease Outcome: Not Progressing

## 2024-03-13 NOTE — Plan of Care (Signed)

## 2024-03-13 NOTE — Progress Notes (Signed)
 Occupational Therapy Treatment Patient Details Name: Ernest Campbell MRN: [id number] DOB: [birth year] Today's Date: 03/13/2024   History of present illness 73 yo male presents to [facility] on 02/25/24 for AMS. MRI suggests L heel osteomyelitis. Family declined surgical intervention. PMH includes recent CVA, R hip ORIF 10/24/23, TBI, dementia, and parkinsonism.   OT comments  Pt supine in bed, asleep but awakens to participate in OT/PT session. Patient requires total assist for hand over hand support to wash face, total assist +2 for bed mobility with preference to R lateral lean at EOB. Progressed to contact guard at EOB after lateral lean onto L elbow.  Attempted stands in stedy with max assist +2, unsuccessful.  Spouse reports plan to do pivot transfers at home, with help.  Discussed concerns and recommended bed level ADLs with assist due to poor tolerance and balance, as well as cognition.  Will follow acutely to decrease burden of care.       If plan is discharge home, recommend the following:  Two people to help with walking and/or transfers;Two people to help with bathing/dressing/bathroom;Assistance with cooking/housework;Direct supervision/assist for financial management;Assist for transportation;Help with stairs or ramp for entrance;Direct supervision/assist for medications management;Assistance with feeding;Supervision due to cognitive status   Equipment Recommendations  Hospital bed;Other (comment) (air mattress overlay on hospital bed)    Recommendations for Other Services      Precautions / Restrictions Precautions Precautions: Fall Recall of Precautions/Restrictions: Impaired Restrictions Weight Bearing Restrictions Per Provider Order: No       Mobility Bed Mobility Overal bed mobility: Needs Assistance Bed Mobility: Sit to Supine, Supine to Sit     Supine to sit: Total assist, +2 for physical assistance Sit to supine: Total assist, +2 for physical assistance   General bed  mobility comments: unable to assist with mobility, TotalAx2    Transfers Overall transfer level: Needs assistance Equipment used: Ambulation equipment used Transfers: Sit to/from Stand Sit to Stand: Via lift equipment, Max assist, +2 physical assistance           General transfer comment: attempts via steady x 3, requires assist to  reach forward and intiatie pull into stand.  Max assist +2 with poor clearance, 50% to standing on 3rd attempt but still unable to place flaps of stedy Transfer via Lift Equipment: Stedy   Balance Overall balance assessment: Needs assistance Sitting-balance support: Feet supported Sitting balance-Leahy Scale: Poor Sitting balance - Comments: lateral lean preference towards R side, mod assist fading to contact guard for safety. pt does not attempt to correct balance. Postural control: Right lateral lean Standing balance support: Bilateral upper extremity supported, During functional activity, Reliant on assistive device for balance Standing balance-Leahy Scale: Zero Standing balance comment: unable to stand fully upright with MaxAx2 and Stedy                           ADL either performed or assessed with clinical judgement   ADL                                         General ADL Comments: Pt not able to initate tasks, no effort despite hand over hand assist to promote initation. Total A to total A+2 for ADLs.    Extremity/Trunk Assessment              Vision   Additional Comments:  will continue to monitor   Perception     Praxis     Communication Communication Communication: Impaired Factors Affecting Communication: Difficulty expressing self;Reduced clarity of speech   Cognition Arousal: Lethargic (improved alertness at EOB) Behavior During Therapy: Flat affect Cognition: Difficult to assess Difficult to assess due to: Impaired communication           OT - Cognition Comments: pt mumbles and  attempts to verbalize but very difficult to understand. poor initation to tasks                 Following commands: Impaired Following commands impaired: Follows one step commands inconsistently      Cueing   Cueing Techniques: Verbal cues, Gestural cues, Tactile cues, Visual cues  Exercises      Shoulder Instructions       General Comments spouse in room and supportive.  Discussed safety with transfers and needing max assist +2 for attempted standing. Pt reports they will be fine at home and she will have assist, with plan to do pivot transfers? (although requested stedy for dc home).  Recommended ADLs at bed level.    Pertinent Vitals/ Pain       Pain Assessment Pain Assessment: Faces Faces Pain Scale: Hurts little more Pain Location: grimaces with movement, unable to localize Pain Descriptors / Indicators: Discomfort Pain Intervention(s): Limited activity within patient's tolerance, Monitored during session, Repositioned  Home Living                                          Prior Functioning/Environment              Frequency  Min 1X/week        Progress Toward Goals  OT Goals(current goals can now be found in the care plan section)  Progress towards OT goals: Progressing toward goals (slowly)  Acute Rehab OT Goals Patient Stated Goal: get back home OT Goal Formulation: With family Time For Goal Achievement: 03/25/24 Potential to Achieve Goals: Fair  Plan      Co-evaluation    PT/OT/SLP Co-Evaluation/Treatment: Yes Reason for Co-Treatment: Complexity of the patient's impairments (multi-system involvement) PT goals addressed during session: Mobility/safety with mobility;Balance OT goals addressed during session: ADL's and self-care;Strengthening/ROM      AM-PAC OT 6 Clicks Daily Activity     Outcome Measure   Help from another person eating meals?: Total Help from another person taking care of personal grooming?:  Total Help from another person toileting, which includes using toliet, bedpan, or urinal?: Total Help from another person bathing (including washing, rinsing, drying)?: Total Help from another person to put on and taking off regular upper body clothing?: Total Help from another person to put on and taking off regular lower body clothing?: Total 6 Click Score: 6    End of Session Equipment Utilized During Treatment: Gait belt;Other (comment) (stedy)  OT Visit Diagnosis: Other abnormalities of gait and mobility (R26.89);Muscle weakness (generalized) (M62.81);Other symptoms and signs involving cognitive function   Activity Tolerance Patient tolerated treatment well   Patient Left with call bell/phone within reach;in bed;with family/visitor present;with SCD's reapplied   Nurse Communication Mobility status        Time: 8841-8767 OT Time Calculation (min): 34 min  Charges: OT General Charges $OT Visit: 1 Visit OT Treatments $Self Care/Home Management : 8-22 mins  Etta NOVAK, OT Acute Rehabilitation Services Office [phone]  Secure Chat Preferred    Etta GORMAN Hope 03/13/2024, 1:21 PM

## 2024-03-13 NOTE — Progress Notes (Signed)
 Physical Therapy Treatment Patient Details Name: Ernest Campbell MRN: [id number] DOB: [birth year] Today's Date: 03/13/2024   History of Present Illness 73 yo male presents to [facility] on 02/25/24 for AMS. MRI suggests L heel osteomyelitis. Family declined surgical intervention. PMH includes recent CVA, R hip ORIF 10/24/23, TBI, dementia, and parkinsonism.   PT Comments  Pt making slow progress towards acute PT goals. Continues to require TotalAx2 for bed mobility with no initiation. Once seated on EOB, pt had a R lateral and posterior lean requiring MaxA to correct. After lateral lean onto L elbow, noted improvements in balance with pt maintaining midline with MinA/CGA. Attempted x3 sit<>stand transfers with MaxAx2 and Stedy with pt unable to reach upright posture to position Stedy flaps. Attempted to perform LE PROM, however, pt resisting movement after x1 repetition. Discussed equipment for return home with spouse reporting plan to perform pivot transfers at home. Discussed concerns and recommendation for hoyer lift with spouse declining at this time. Acute PT to follow.    If plan is discharge home, recommend the following: Two people to help with walking and/or transfers;Two people to help with bathing/dressing/bathroom;Assistance with cooking/housework;Assistance with feeding;Direct supervision/assist for medications management;Direct supervision/assist for financial management;Assist for transportation;Help with stairs or ramp for entrance;Supervision due to cognitive status   Can travel by private vehicle      No  Equipment Recommendations  Hospital bed;Hoyer lift (specialty air mattress due to wounds)       Precautions / Restrictions Precautions Precautions: Fall Recall of Precautions/Restrictions: Impaired Restrictions Weight Bearing Restrictions Per Provider Order: No     Mobility  Bed Mobility Overal bed mobility: Needs Assistance Bed Mobility: Sit to Supine, Supine to Sit    Supine  to sit: Total assist, +2 for physical assistance Sit to supine: Total assist, +2 for physical assistance   General bed mobility comments: unable to assist with mobility, TotalAx2    Transfers Overall transfer level: Needs assistance Equipment used: Ambulation equipment used Transfers: Sit to/from Stand Sit to Stand: Via lift equipment, Max assist, +2 physical assistance      General transfer comment: attempts via steady x 3, requires assist to  reach forward and intiate pull into stand.  Max assist +2 with poor clearance, 50% to standing on 3rd attempt but still unable to place flaps of stedy Transfer via Lift Equipment: Stedy     Balance Overall balance assessment: Needs assistance Sitting-balance support: Feet supported Sitting balance-Leahy Scale: Poor Sitting balance - Comments: lateral lean preference towards R side, MaxA assist fading to MinA/CGA for safety. pt does not attempt to correct balance. Postural control: Right lateral lean Standing balance support: Bilateral upper extremity supported, During functional activity, Reliant on assistive device for balance Standing balance-Leahy Scale: Zero Standing balance comment: unable to stand fully upright with MaxAx2 and Stedy       Communication Communication Communication: Impaired Factors Affecting Communication: Difficulty expressing self;Reduced clarity of speech  Cognition Arousal: Lethargic (improved alertness at EOB) Behavior During Therapy: Flat affect   PT - Cognitive impairments: History of cognitive impairments, Awareness, Memory, Attention, Initiation, Sequencing, Problem solving, Safety/Judgement      PT - Cognition Comments: speech attempts mostly unintelligble with pt intermittently following commands Following commands: Impaired Following commands impaired: Follows one step commands inconsistently    Cueing Cueing Techniques: Verbal cues, Gestural cues, Tactile cues, Visual cues  Exercises General  Exercises - Lower Extremity Ankle Circles/Pumps: PROM, Both, Supine, 5 reps Heel Slides: PROM, Both, Supine (1 rep)  General Comments General comments (skin integrity, edema, etc.): spouse in room and supportive. Discussed safety with transfers and needing max assist +2 for attempted standing. Pt reports they will be fine at home and she will have assist, with plan to do pivot transfers? (although requested stedy for dc home). Recommended ADLs at bed level.      Pertinent Vitals/Pain Pain Assessment Pain Assessment: Faces Faces Pain Scale: Hurts little more Pain Location: grimaces with movement, unable to localize Pain Descriptors / Indicators: Discomfort Pain Intervention(s): Limited activity within patient's tolerance, Monitored during session, Repositioned     PT Goals (current goals can now be found in the care plan section) Acute Rehab PT Goals PT Goal Formulation: With family Time For Goal Achievement: 03/25/24 Potential to Achieve Goals: Fair Progress towards PT goals: Progressing toward goals    Frequency    Min 1X/week           Co-evaluation   Reason for Co-Treatment: Complexity of the patient's impairments (multi-system involvement) PT goals addressed during session: Mobility/safety with mobility;Balance OT goals addressed during session: ADL's and self-care;Strengthening/ROM      AM-PAC PT 6 Clicks Mobility   Outcome Measure  Help needed turning from your back to your side while in a flat bed without using bedrails?: Total Help needed moving from lying on your back to sitting on the side of a flat bed without using bedrails?: Total Help needed moving to and from a bed to a chair (including a wheelchair)?: Total Help needed standing up from a chair using your arms (e.g., wheelchair or bedside chair)?: Total Help needed to walk in hospital room?: Total Help needed climbing 3-5 steps with a railing? : Total 6 Click Score: 6    End of Session  Equipment Utilized During Treatment: Gait belt Activity Tolerance: Patient limited by fatigue;Patient limited by lethargy Patient left: in bed;with call bell/phone within reach;with bed alarm set;with family/visitor present Nurse Communication: Mobility status PT Visit Diagnosis: Muscle weakness (generalized) (M62.81);Unsteadiness on feet (R26.81)     Time: 8841-8767 PT Time Calculation (min) (ACUTE ONLY): 34 min  Charges:    $Therapeutic Activity: 8-22 mins PT General Charges $$ ACUTE PT VISIT: 1 Visit           Kate ORN, PT, DPT Secure Chat Preferred  Rehab Office [phone]    Kate BRAVO Wendolyn 03/13/2024, 3:37 PM

## 2024-03-13 NOTE — Progress Notes (Signed)
  Progress Note   Patient: MEAD SLANE FMW:[id number] DOB: 05/11/1951 DOA: 02/25/2024     15 DOS: the patient was seen and examined on 03/13/2024        Brief hospital course: 73 year old M with PMH of parkinsonism, possible Lewy body dementia, anemia, sacral and left heel decubitus presenting with altered mental status since 02/21/2024 after he started Zyprexa  by his neurologist, and admitted with acute toxic and metabolic encephalopathy.  Workup in ED significant for lactic acidosis.  CT head and MRI brain without acute finding.  Left heel x-ray raise concern for osteomyelitis.  Neurology consulted. The next day, MRI of left heel suggested osteomyelitis.  Orthopedic surgery consulted but family declined surgery and chose nonoperative treatment.  Started on broad-spectrum antibiotics.  ID and palliative medicine consulted. Cortrack placed on 7/25 and started on TF.       Assessment and Plan: Encephalopathy - PT/OT  Dysphagia Severe protein calorie malnutrition Passed calorie count well.  Discussed risks and benefits of cortrak with wife, she prefers to remove now.  Given good calorie count results and new nosebleed today, I think this is wise. - Continue Ensure and Prosource - Dietitian  Osteomyelitis - Continue doxycycline  and Augmentin   Suspected Lewy body dementia Parkinsonism - Continue Sinemet , aspirin , thiamine   Urinary retention Doing well with foley removed - Continue doxazosin   Normocytic anemia Stable, mild nose bleed          Subjective: Did well with calorie count.  Still very weak and does not respond intellgibly.     Physical Exam: BP (!) 89/55 (BP Location: Right Arm)   Pulse 88   Temp 99 F (37.2 C) (Oral)   Resp 18   Ht 5' 5 (1.651 m)   Wt 59.9 kg   SpO2 99%   BMI 21.97 kg/m   Thin frail elderly adult male, lying in bed, does not respond to my voice or touch RRR, no murmurs, no peripheral edema Respiratory rate normal, lungs clear  without rales or wheezes Abdomen soft, no grimace to palpation Generalized weakness in all 4 extremities, does not make eye contact, does not follow commands  Data Reviewed: CBC showed stable anemia BMP shows normal Cr, elevated BUN/Cr otherwise normal electrolytes     Family Communication: wife at bedside    Disposition: Status is: Inpatient         Author: Lonni SHAUNNA Dalton, MD 03/13/2024 2:34 PM  For on call review [email or web address].

## 2024-03-14 DIAGNOSIS — R40244 Other coma, without documented Glasgow coma scale score, or with partial score reported, unspecified time: Secondary | ICD-10-CM | POA: Diagnosis not present

## 2024-03-14 LAB — GLUCOSE, CAPILLARY
Glucose-Capillary: 94 mg/dL (ref 70–99)
Glucose-Capillary: 91 mg/dL (ref 70–99)

## 2024-03-14 MED ORDER — IBUPROFEN 200 MG PO TABS
400.0000 mg | ORAL_TABLET | Freq: Once | ORAL | Status: AC
  Administered 2024-03-14: 400 mg via ORAL
  Filled 2024-03-14: qty 2

## 2024-03-14 MED ORDER — DICLOFENAC SODIUM 1 % EX GEL
4.0000 g | Freq: Four times a day (QID) | CUTANEOUS | Status: DC
  Administered 2024-03-14 – 2024-03-16 (×8): 4 g via TOPICAL
  Filled 2024-03-14: qty 100

## 2024-03-14 NOTE — Plan of Care (Signed)

## 2024-03-14 NOTE — Progress Notes (Signed)
 Progress Note   Patient: Ernest Campbell FMW:[id number] DOB: 21-Nov-1950 DOA: 02/25/2024     16 DOS: the patient was seen and examined on 03/14/2024       Brief hospital course: 73 y.o. M with cognitive impairment, history Parkinsonism, who presented with acute decreased mentation.  Evidently patient recently started on Zyprexa  by Neurology.  After two doses he became stuporous and when this did not abate for ~48 hours, patient was brought to the hospital.  Subsequently found also to have osteomyelitis of the left heel.  Ortho consulted, family declined amputation.       Assessment and Plan:  Acute metabolic encphalopathy Admitted and Neurology consulted.  CT head, MRI brain showed no acute finding.  EEG showed diffuse encephalopathy, no seizure-like activity.  UA ruled out UTI.  B12, RPR, TSH, ammonia, VBG unremarkable.  Neurology suspected worsening motor and cognitive function in the setting of antipsychotic use with LBD.    After 2 weeks, the patient had no improvement, neurology were reconsulted and reviewed with family the uncertain and variable (sometimes irreversible) cognitive and motor worsening after antipsychotic use and LBD patients.  In the last 72 hours, the patient has had some improvement in alertness, and ability to take an oral intake, to the point that he has been able to have his core track removed, and is eating somewhat well.  Palliative care was consulted, and family meeting with the wife, medical team shared our uncertainty that he could sustain his fulid and calorie needs orally and discussed feeding tube or Hospice.  Family considered both but rejected both.  Cortrak removed 8/7. - Palliative to follow at home   Osteomyelitis of the left heel Admitted and found to have osteomyelitis.  Evaluated by orthopedics, Dr. Trude.  Second opinion requested, seen by Dr. Barton from orthopedics and also plastic surgery, who recommended wound care and outpatient  follow-up.  ID consulted. - Continue Augmentin  and doxycycline , EOT 9/2 - Outpatient follow-up with wound care center unless amputation/operative treatment plan were to be considered - Offload with prevalon - Daily wound care (see orders)   Dementia Patient of Dr. Chalice since 2022 at which time he was being evaluated for shuffling gait and forgetfulness after falling off the kitchen counter and hitting his head.    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  From my discussions with wife, she expresses uncertainty about the diagnosis of dementia, and certainly I see some of that uncertainty in Neurology notes, and certainly the abrupt onset after TBI, the occurrence of at least 2 episodes of delirium (with associated fluctuations in cognitive function) have obscured what appears to be an obvious pattern in the last 3 years: MMSE Nov 2022: 23/30 MMSE Jun 2023: 22/30 with serial 7s, 26/30 with DLROW MOCA Jun 2024: 17/30  MMSE June 2025: 14/30   Severe protein calorie malnutrition Failure to thrive Dysphagia due to generalized weakness See our reservations above - Continue dietary supplements   Parkinsonism - Continue Sinemet   Urinary retention Foley removed several days ago, doing well - Continue doxazosin   Normocytic anemia Stable       Subjective: No new complaints, no nursing concerns     Physical Exam: BP (P) 107/60 (BP Location: Right Arm)   Pulse (P) 78   Temp (P) 97.8 F (36.6 C) (Axillary)   Resp (P) 18   Ht 5' 5 (1.651 m)   Wt 56.3 kg   SpO2 (P) 98%  BMI 20.65 kg/m   Thin frail elderly adult male, lying in bed, oropharynx moist, opens eyes to touch, does not make eye contact RRR, no murmurs, no peripheral edema Respiratory rate normal, lungs clear without rales or wheezes, overall diminished, poor inspiratory effort Abdomen soft, no grimace to palpation, no  guarding rouses sluggishly to touch, briefly makes eye contact, does not follow commands, severe generalized weakness in all 4 extremities makes no intelligible responses to questions      Data Reviewed: No new labs     Family Communication:     Disposition: Status is: Inpatient         Author: Lonni SHAUNNA Dalton, MD 03/14/2024 1:07 PM  For on call review [email or web address].

## 2024-03-14 NOTE — Plan of Care (Signed)
  Problem: Health Behavior/Discharge Planning: Goal: Ability to manage health-related needs will improve Outcome: Progressing   Problem: Clinical Measurements: Goal: Will remain free from infection Outcome: Progressing   Problem: Clinical Measurements: Goal: Respiratory complications will improve Outcome: Progressing   Problem: Nutrition: Goal: Adequate nutrition will be maintained Outcome: Progressing   Problem: Activity: Goal: Risk for activity intolerance will decrease Outcome: Progressing   Problem: Coping: Goal: Level of anxiety will decrease Outcome: Progressing   Problem: Elimination: Goal: Will not experience complications related to bowel motility Outcome: Progressing   Problem: Safety: Goal: Ability to remain free from injury will improve Outcome: Progressing   Problem: Skin Integrity: Goal: Risk for impaired skin integrity will decrease Outcome: Progressing

## 2024-03-15 DIAGNOSIS — R40244 Other coma, without documented Glasgow coma scale score, or with partial score reported, unspecified time: Secondary | ICD-10-CM | POA: Diagnosis not present

## 2024-03-15 LAB — CBC
HCT: 32.6 % — ABNORMAL LOW (ref 39.0–52.0)
Hemoglobin: 10.7 g/dL — ABNORMAL LOW (ref 13.0–17.0)
MCH: 31.1 pg (ref 26.0–34.0)
MCHC: 32.8 g/dL (ref 30.0–36.0)
MCV: 94.8 fL (ref 80.0–100.0)
Platelets: 535 K/uL — ABNORMAL HIGH (ref 150–400)
RBC: 3.44 MIL/uL — ABNORMAL LOW (ref 4.22–5.81)
RDW: 14 % (ref 11.5–15.5)
WBC: 7 K/uL (ref 4.0–10.5)
nRBC: 0 % (ref 0.0–0.2)

## 2024-03-15 LAB — COMPREHENSIVE METABOLIC PANEL WITH GFR
ALT: 14 U/L (ref 0–44)
AST: 23 U/L (ref 15–41)
Albumin: 2.9 g/dL — ABNORMAL LOW (ref 3.5–5.0)
Alkaline Phosphatase: 86 U/L (ref 38–126)
Anion gap: 10 (ref 5–15)
BUN: 34 mg/dL — ABNORMAL HIGH (ref 8–23)
CO2: 23 mmol/L (ref 22–32)
Calcium: 9.1 mg/dL (ref 8.9–10.3)
Chloride: 105 mmol/L (ref 98–111)
Creatinine, Ser: 0.9 mg/dL (ref 0.61–1.24)
GFR, Estimated: >60 mL/min (ref >60)
Glucose, Bld: 112 mg/dL — ABNORMAL HIGH (ref 70–99)
Potassium: 4 mmol/L (ref 3.5–5.1)
Sodium: 138 mmol/L (ref 135–145)
Total Bilirubin: 0.6 mg/dL (ref 0.0–1.2)
Total Protein: 6.4 g/dL — ABNORMAL LOW (ref 6.5–8.1)

## 2024-03-15 LAB — GLUCOSE, CAPILLARY: Glucose-Capillary: 132 mg/dL — ABNORMAL HIGH (ref 70–99)

## 2024-03-15 NOTE — Progress Notes (Signed)
  Progress Note   Patient: Ernest Campbell FMW:[id number] DOB: [birth year] DOA: 02/25/2024     17 DOS: the patient was seen and examined on 03/15/2024       Brief hospital course: 73 y.o. M with cognitive impairment, history Parkinsonism, who presented with acute decreased mentation.         Assessment and Plan:  Acute metabolic encphalopathy Seems better today, see note from 8/9 - Palliative to follow at home   Osteomyelitis of the left heel - Continue Augmentin , see plan from 8/9   Dementia Stable, see note from 8/9   Severe protein calorie malnutrition Failure to thrive Dysphagia due to generalized weakness - Continue dietary supplements   Parkinsonism - Continue Sinemet   Urinary retention Foley removed several days ago, doing well - Continue doxazosin   Normocytic anemia Stable       Subjective: No new complaints, no nursing concerns     Physical Exam: BP 120/62 (BP Location: Right Arm)   Pulse 81   Temp 97.8 F (36.6 C) (Oral)   Resp 18   Ht 5' 5 (1.651 m)   Wt 57.7 kg   SpO2 98%   BMI 21.17 kg/m   Thin frail elderly adult male, lying in bed, oropharynx moist, opens eyes to touch, does not make eye contact RRR, no murmurs, no peripheral edema Respiratory rate normal, lungs clear without rales or wheezes, overall diminished, poor inspiratory effort Abdomen soft, no grimace to palpation, no guarding Opens eyes to voice and tries to answer qustions, but voice hypophonic and without teeth or saliva not intelligible.  Face symmetric.  Severe gneralied weakness.  No tremor.  Severe loss of muscle mass and fat diffusely.      Data Reviewed: No new labs     Family Communication:     Disposition: Status is: Inpatient         Author: Lonni SHAUNNA Dalton, MD 03/15/2024 3:03 PM  For on call review [email or web address].

## 2024-03-15 NOTE — Plan of Care (Signed)
  Problem: Health Behavior/Discharge Planning: Goal: Ability to manage health-related needs will improve Outcome: Progressing   Problem: Clinical Measurements: Goal: Will remain free from infection Outcome: Progressing   Problem: Activity: Goal: Risk for activity intolerance will decrease Outcome: Progressing   Problem: Nutrition: Goal: Adequate nutrition will be maintained Outcome: Progressing   Problem: Safety: Goal: Ability to remain free from injury will improve Outcome: Progressing   Problem: Skin Integrity: Goal: Risk for impaired skin integrity will decrease Outcome: Progressing

## 2024-03-16 ENCOUNTER — Other Ambulatory Visit (HOSPITAL_COMMUNITY): Payer: Self-pay

## 2024-03-16 DIAGNOSIS — R40244 Other coma, without documented Glasgow coma scale score, or with partial score reported, unspecified time: Secondary | ICD-10-CM | POA: Diagnosis not present

## 2024-03-16 LAB — GLUCOSE, CAPILLARY: Glucose-Capillary: 114 mg/dL — ABNORMAL HIGH (ref 70–99)

## 2024-03-16 MED ORDER — AMOXICILLIN-POT CLAVULANATE 875-125 MG PO TABS
1.0000 | ORAL_TABLET | Freq: Two times a day (BID) | ORAL | 0 refills | Status: AC
  Filled 2024-03-16: qty 44, 22d supply, fill #0

## 2024-03-16 MED ORDER — ASPIRIN 81 MG PO CHEW
81.0000 mg | CHEWABLE_TABLET | Freq: Every day | ORAL | 0 refills | Status: AC
  Filled 2024-03-16: qty 30, 30d supply, fill #0

## 2024-03-16 MED ORDER — MEDIHONEY WOUND/BURN DRESSING EX PSTE
1.0000 | PASTE | Freq: Every day | CUTANEOUS | 0 refills | Status: AC
  Filled 2024-03-16: qty 44, 44d supply, fill #0

## 2024-03-16 MED ORDER — ENSURE PLUS HIGH PROTEIN PO LIQD
237.0000 mL | Freq: Three times a day (TID) | ORAL | Status: AC
Start: 2024-03-16 — End: ?

## 2024-03-16 MED ORDER — PROSOURCE PLUS PO LIQD: 30.0000 mL | Freq: Two times a day (BID) | ORAL | Status: AC

## 2024-03-16 MED ORDER — CARBIDOPA-LEVODOPA 25-100 MG PO TABS: 1.0000 | ORAL_TABLET | Freq: Three times a day (TID) | ORAL | 11 refills | Status: AC

## 2024-03-16 MED ORDER — DOXYCYCLINE HYCLATE 100 MG PO TABS
100.0000 mg | ORAL_TABLET | Freq: Two times a day (BID) | ORAL | 0 refills | Status: AC
  Filled 2024-03-16: qty 44, 22d supply, fill #0

## 2024-03-16 MED ORDER — DOXAZOSIN MESYLATE 2 MG PO TABS
1.0000 mg | ORAL_TABLET | Freq: Every day | ORAL | 0 refills | Status: AC
  Filled 2024-03-16: qty 15, 30d supply, fill #0

## 2024-03-16 NOTE — Discharge Summary (Signed)
 Physician Discharge Summary   Patient: Ernest Campbell MRN: [id number] DOB: 16-Mar-1951  Admit date:     02/25/2024  Discharge date: 03/16/24  Discharge Physician: Lonni SHAUNNA Dalton   PCP: [facility]    Recommendations at discharge:  Follow up with Infectious Disease for calcaneal osteomyelitis [facility]: Please refer to Wound Care for heel wound if able to transport Recommend Palliative Care to follow after discharge, if functional status worsens despite appropriate treatment, would recommend Hospice referral Check CBC and BMP in 2 weeks and then at least every 2 weeks until off antibiotics     Discharge Diagnoses: Principal Problem:   Acute metabolic encephalopathy Active Problems:   Osteomyelitis of the left heel   Dementia   Severe protein calorie malnutrition   Failure to thrive   Dysphagia due to generalized weakness   Parkinsonism   Urinary retention     Normocytic anemia    Pressure injury of skin, left heel unstageable and sacrum stage II, present on admission      Hospital Course: 73 y.o. M with cognitive impairment, history Parkinsonism, who presented with acute decreased mentation.   Evidently patient recently started on Zyprexa  by Neurology.  After two doses he became stuporous and when this did not abate for ~48 hours, patient was brought to the hospital.   Subsequently found also to have osteomyelitis of the left heel.  Ortho consulted, family declined amputation.      Acute metabolic encphalopathy Admitted and Neurology consulted.  CT head, MRI brain showed no acute finding.  EEG showed diffuse encephalopathy, no seizure-like activity.  UA ruled out UTI.  B12, RPR, TSH, ammonia, VBG unremarkable.  Neurology suspected worsening motor and cognitive function in the setting of antipsychotic use with lewy body dementia.     After 2 weeks, the patient had no improvement, neurology were reconsulted and reviewed with family the uncertain and variable  (sometimes irreversible) cognitive and motor worsening after antipsychotic use in LBD patients.   Gradually, the patient has had some improvement in alertness, and ability to take an oral intake, to the point that he was able to tolerate PO intake well, had Cortrak feeding tube removed and was able to participate in self cares.      Palliative care was consulted, and in family meeting with the wife, medical team shared our frank uncertainty about his prognosis.  Wife remains hopeful of sustained recovery which is reasonable.    SNF rehab was recommended but deferred by family.  Services and equipment were maximized to the extent possible by the hospital, and patient was discharged home where he has good care.  I continue to have a high degree of uncertainty that he can sustain his fluid and calorie needs orally.  I did discuss feeding tube or Hospice with wife, who thoughtfully considered both but rejects both and remains hopeful.    Recommend palliative to follow at home.  If patient has decline in mentation or functional status or oral intake, or is readmitted, would recommend hospice.       Osteomyelitis of the left heel Admitted and found to have osteomyelitis by MRI.    Evaluated by orthopedics, Dr. Harden.  Second opinion requested, seen by Dr. Barton from orthopedics and also plastic surgery, who recommended wound care and outpatient follow-up.  ID consulted. - Continue Augmentin  and doxycycline , EOT 9/2 - Would recommend outpatient follow-up with wound care center unless amputation/operative treatment plan were to be considered - Offload to the extent able - Daily  wound care reviewed with wife     Dementia Patient of Dr. Chalice since 2022 at which time he was being evaluated for shuffling gait and forgetfulness after falling off the kitchen counter and hitting his head.     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   From my discussions with wife, she expresses uncertainty about the diagnosis of dementia, and certainly I see some of that uncertainty in Neurology notes, at times, although I suspect this is Dr. Lionell most likely diagnosis.  This uncertainty (by wife) makes sense given that the abrupt onset after TBI, the occurrence of at least 2 episodes of delirium (with associated fluctuations in cognitive function) have probably obscured what appears to be an obvious pattern in the last 3 years: MMSE Nov 2022: 23/30 MMSE Jun 2023: 22/30 with serial 7s, 26/30 with DLROW MOCA Jun 2024: 17/30  MMSE June 2025: 71/30   The patient has clearly lost a lot of weight, consistent with advancing dementia.  At the time of discharge, the patient now does not appaer able to sit up without assistance, and he can only speak a few words, and requires to be fed.   Severe protein calorie malnutrition Failure to thrive Dysphagia due to generalized weakness See our reservations above.  Nutritional supplements recommended.   Parkinsonism On Sinemet    Urinary retention Developed urinary retention in the hospital.  Had a foley for a time, this was removed after starting doxazosin  and patient doing well.   Normocytic anemia Stable        The [place]  Controlled Substances Registry was reviewed for this patient prior to discharge.  Consultants: Neurology Palliative Care    Disposition: Home health   DISCHARGE MEDICATION: Allergies as of 03/16/2024       Reactions   Fentanyl  And Related Anaphylaxis   Other Other (See Comments)   According to the patient, all opioids cause hallucinations    Prednisone  Other (See Comments)   Hallucinations - Allergic, per [facility]   Benadryl  [diphenhydramine ] Other (See Comments)   hallucinations   Codeine Other (See Comments)   Hallucinations - Allergic, per [facility]   Haldol  [haloperidol ]    Prolonged encephalopathy in setting of suspected LBD    Morphine  Other (See Comments)   Hallucinations - Allergic, per [facility]   Oxycodone Other (See Comments)   Hallucinations - Allergic, per [facility]   Zyprexa  [olanzapine ]    Prolonged encephalopathy in setting of suspected LBD        Medication List     PAUSE taking these medications    memantine  5 MG tablet Wait to take this until your doctor or other care provider tells you to start again. Commonly known as: NAMENDA  Take 5 mg by mouth 2 (two) times daily.       STOP taking these medications    ALPRAZolam  0.5 MG tablet Commonly known as: XANAX    OLANZapine  2.5 MG tablet Commonly known as: ZyPREXA        TAKE these medications    (feeding supplement) PROSource Plus liquid Take 30 mLs by mouth 2 (two) times daily between meals.   feeding supplement Liqd Take 237 mLs by mouth 3 (three) times daily between meals.   amoxicillin -clavulanate 875-125 MG tablet Commonly known as: AUGMENTIN  Take 1 tablet by mouth every 12 (twelve) hours for 22 days.   Aspirin  Low Dose 81 MG chewable tablet Generic drug: aspirin  Chew 1 tablet (81 mg total) by mouth daily.  Start taking on: March 17, 2024   carbidopa -levodopa  25-100 MG tablet Commonly known as: SINEMET  IR Place 1 tablet into feeding tube 3 (three) times daily.   doxazosin  2 MG tablet Commonly known as: CARDURA  Take 0.5 tablets (1 mg total) by mouth daily. Start taking on: March 17, 2024   doxycycline  100 MG tablet Commonly known as: VIBRA -TABS Take 1 tablet (100 mg total) by mouth every 12 (twelve) hours for 22 days.   leptospermum manuka honey Pste paste Apply 1 Application topically daily.               Durable Medical Equipment  (From admission, onward)           Start     Ordered   03/12/24 1115  For home use only DME Other see comment  Once       Comments: Steady lift  Question:  Length of Need  Answer:  6 Months   03/12/24 1115              Discharge Care Instructions  (From  admission, onward)           Start     Ordered   03/16/24 0000  Discharge wound care:       Comments: Left heel: Apply Santyl  or Medihoney to the left heel wound once daily Cover with moist gauze, then ABD pad and Kerlix Replace daily or if soiled   Back side/sacrum: Change dressing daily Remove old dressing Apply Aquacel packing to sacrum  Cover with foam dressing  Foam dressing can stay in place a few days with packing underneath changed Change foam dressing every three days   03/16/24 1113            Follow-up Information     Sallee Barks, MD Follow up.   Specialty: Family Medicine Contact information: [address] [place] [address] [phone]         Fleeta Rothman, Jomarie SAILOR, MD Follow up.   Specialty: Infectious Diseases Contact information: [address] [place] [address] [phone]                 Discharge Instructions     Discharge instructions   Complete by: As directed    **IMPORTANT DISCHARGE INSTRUCTIONS**   From Dr. Jonel: You were admitted to the hospital for decreased responsiveness. We call this encephalopathy' meaning a global brain dysfunction Delirium is one example of encephalopathy. In this case, we looked for many many reversible causes of the encephalopathy with a CT of the brain, MRI of the brain, blood work for thyroid disease, B12 deficiency, liver or kidney failure or brain infection.  Ultimately, these tests were normal and we have confidence that the cause was from the infection in the heel bone and the Zyprexa  in the setting of chronic dementia.  Stop Zyprexa   For the heel: Continue wound care as below The plastic surgeon said it does not need plastic surgery, but it would be reasonable to ask your primary doctor for a referral to a wound care center It would also be reasonable to tend the wound by yourslef, making sure to offload the heel, applying wound care as detailed  below, and going to see the infectious disease doctors  Take the antibiotics for infection Augmentin  and doxycycline  The plan for now is to finish six weeks of antibiotics on Sep 2 Go see Dr. Fleeta Rothman from infectious disease in 2 weeks and discuss (see date and time below)  In the hospital, Koren had some bladder retention, so we started the medicine doxazosin . You could try weaning off this medicine at home in a couple weeks. Discuss with your primary doctor   Discharge wound care:   Complete by: As directed    Left heel: Apply Santyl  or Medihoney to the left heel wound once daily Cover with moist gauze, then ABD pad and Kerlix Replace daily or if soiled   Back side/sacrum: Change dressing daily Remove old dressing Apply Aquacel packing to sacrum  Cover with foam dressing  Foam dressing can stay in place a few days with packing underneath changed Change foam dressing every three days   Increase activity slowly   Complete by: As directed        Discharge Exam: Filed Weights   03/13/24 0743 03/14/24 0500 03/15/24 0500  Weight: 59.9 kg 56.3 kg 57.7 kg  BP 112/76 (BP Location: Right Arm)   Pulse 100   Temp 98.3 F (36.8 C)   Resp 18   Ht 5' 5 (1.651 m)   Wt 57.7 kg   SpO2 100%   BMI 21.17 kg/m    General: Pt is awake, not in acute distress, but mentation limited Cardiovascular: Slight tachycardia, regular, nl S1-S2, no murmurs appreciated.   No LE edema.   Respiratory: Normal respiratory rate and rhythm.  CTAB without rales or wheezes. Abdominal: Abdomen soft and non-tender.  No distension or HSM.   MSK: Severe diffuse loss of subcutaneous muscle mass and fat Neuro/Psych: He seems to have contractures in all four extremities.  Deos not follow commands consistently with me.  Resists movement in upper extremities symmetrically, strength seems good.  Memory seems impaired.  Makes no intelligible responses to me.  Answers yes/no to some other questions, identifies family.       Condition at discharge: poor  The results of significant diagnostics from this hospitalization (including imaging, microbiology, ancillary and laboratory) are listed below for reference.   Imaging Studies: VAS US  LOWER EXTREMITY VENOUS (DVT) Result Date: 03/04/2024  Lower Venous DVT Study Patient Name:  IGNACE MANDIGO  Date of Exam:   03/03/2024 Medical Rec #: [id number]         Accession #:    [id number] Date of Birth: 1951/07/28          Patient Gender: M Patient Age:   87 years Exam Location:  [facility] Procedure:      VAS US  LOWER EXTREMITY VENOUS (DVT) Referring Phys: MIGNON GONFA --------------------------------------------------------------------------------  Indications: Fever. Other Indications: Posterior left heel pressure ulcer. Risk Factors: Stroke. Performing Technologist: Ricka Sturdivant-Jones RDMS, RVT  Examination Guidelines: A complete evaluation includes B-mode imaging, spectral Doppler, color Doppler, and power Doppler as needed of all accessible portions of each vessel. Bilateral testing is considered an integral part of a complete examination. Limited examinations for reoccurring indications may be performed as noted. The reflux portion of the exam is performed with the patient in reverse Trendelenburg.  +---------+---------------+---------+-----------+----------+--------------+ RIGHT    CompressibilityPhasicitySpontaneityPropertiesThrombus Aging +---------+---------------+---------+-----------+----------+--------------+ CFV      Full           Yes      Yes                                 +---------+---------------+---------+-----------+----------+--------------+ SFJ      Full                                                        +---------+---------------+---------+-----------+----------+--------------+  FV Prox  Full                                                        +---------+---------------+---------+-----------+----------+--------------+  FV Mid   Full                                                        +---------+---------------+---------+-----------+----------+--------------+ FV DistalFull                                                        +---------+---------------+---------+-----------+----------+--------------+ PFV      Full                                                        +---------+---------------+---------+-----------+----------+--------------+ POP      Full           Yes      Yes                                 +---------+---------------+---------+-----------+----------+--------------+ PTV      Full                                                        +---------+---------------+---------+-----------+----------+--------------+ PERO     Full                                                        +---------+---------------+---------+-----------+----------+--------------+   +---------+---------------+---------+-----------+----------+--------------+ LEFT     CompressibilityPhasicitySpontaneityPropertiesThrombus Aging +---------+---------------+---------+-----------+----------+--------------+ CFV      Full           Yes      Yes                                 +---------+---------------+---------+-----------+----------+--------------+ SFJ      Full                                                        +---------+---------------+---------+-----------+----------+--------------+ FV Prox  Full                                                        +---------+---------------+---------+-----------+----------+--------------+  FV Mid   Full                                                        +---------+---------------+---------+-----------+----------+--------------+ FV DistalFull                                                        +---------+---------------+---------+-----------+----------+--------------+ PFV      Full                                                         +---------+---------------+---------+-----------+----------+--------------+ POP      Full           Yes      Yes                                 +---------+---------------+---------+-----------+----------+--------------+ PTV      Full                                                        +---------+---------------+---------+-----------+----------+--------------+ PERO     Full                                                        +---------+---------------+---------+-----------+----------+--------------+     Summary: BILATERAL: - No evidence of deep vein thrombosis seen in the lower extremities, bilaterally. -No evidence of popliteal cyst, bilaterally.   *See table(s) above for measurements and observations. Electronically signed by Norman Serve on 03/04/2024 at 10:48:36 AM.    Final    VAS US  UPPER EXTREMITY VENOUS DUPLEX Result Date: 03/04/2024 UPPER VENOUS STUDY  Patient Name:  ALIXANDER RALLIS  Date of Exam:   03/03/2024 Medical Rec #: [id number]         Accession #:    [id number] Date of Birth: 1951-07-31          Patient Gender: M Patient Age:   50 years Exam Location:  [facility] Procedure:      VAS US  UPPER EXTREMITY VENOUS DUPLEX Referring Phys: TRUNG VU --------------------------------------------------------------------------------  Indications: Swelling Limitations: Very limited exam due to patient resisting and not allowing to position extremity as need to imaging. Performing Technologist: Elmarie Lindau, RVT  Examination Guidelines: A complete evaluation includes B-mode imaging, spectral Doppler, color Doppler, and power Doppler as needed of all accessible portions of each vessel. Bilateral testing is considered an integral part of a complete examination. Limited examinations for reoccurring indications may be performed as noted.  Right Findings: +----------+------------+---------+-----------+----------+-------+ RIGHT      CompressiblePhasicitySpontaneousPropertiesSummary +----------+------------+---------+-----------+----------+-------+ IJV  Full       Yes                                +----------+------------+---------+-----------+----------+-------+ Subclavian               Yes                                +----------+------------+---------+-----------+----------+-------+ Brachial      Full                                          +----------+------------+---------+-----------+----------+-------+ Basilic       Full                                          +----------+------------+---------+-----------+----------+-------+  Left Findings: +----------+------------+---------+-----------+----------+-------+ LEFT      CompressiblePhasicitySpontaneousPropertiesSummary +----------+------------+---------+-----------+----------+-------+ IJV           Full       Yes                                +----------+------------+---------+-----------+----------+-------+ Subclavian               Yes                                +----------+------------+---------+-----------+----------+-------+  Summary:  Right: No evidence of right upper extremity DVT/SVT in areas visualized.  Left: No evidence of left upper extremity DVT in areas visualized.  *See table(s) above for measurements and observations.  Diagnosing physician: Norman Serve Electronically signed by Norman Serve on 03/04/2024 at 10:48:21 AM.    Final    CT ABDOMEN PELVIS W CONTRAST Result Date: 03/03/2024 CLINICAL DATA:  Sepsis with fever EXAM: CT ABDOMEN AND PELVIS WITH CONTRAST TECHNIQUE: Multidetector CT imaging of the abdomen and pelvis was performed using the standard protocol following bolus administration of intravenous contrast. RADIATION DOSE REDUCTION: This exam was performed according to the departmental dose-optimization program which includes automated exposure control, adjustment of the mA and/or kV  according to patient size and/or use of iterative reconstruction technique. CONTRAST:  75mL OMNIPAQUE  IOHEXOL  350 MG/ML SOLN COMPARISON:  None Available. FINDINGS: Lower chest: There is some atelectasis in both lung bases. Hepatobiliary: No focal liver abnormality is seen. No gallstones, gallbladder wall thickening, or biliary dilatation. Pancreas: Unremarkable. No pancreatic ductal dilatation or surrounding inflammatory changes. Spleen: Normal in size without focal abnormality. Adrenals/Urinary Tract: The bilateral kidneys and adrenal glands are within normal limits. The bladder is decompressed by Foley catheter. Cannot exclude diffuse bladder wall thickening. Stomach/Bowel: Stomach is within normal limits. Enteric tube tip is in the gastric antrum. Appendix is not seen. No evidence of bowel wall thickening, distention, or inflammatory changes. There is sigmoid colon diverticulosis. Vascular/Lymphatic: Aortic atherosclerosis. No enlarged abdominal or pelvic lymph nodes. Reproductive: Prostate is unremarkable. Other: There is no ascites or focal abdominal wall hernia. There is a small focus of subcutaneous air in the lower anterior abdominal wall image 3/77. Musculoskeletal: Right hip screw is present. IMPRESSION: 1. No acute localizing process in the abdomen or  pelvis. 2. Bladder is decompressed by Foley catheter. Cannot exclude diffuse bladder wall thickening. Correlate clinically for cystitis. 3. Small focus of subcutaneous air in the lower anterior abdominal wall, possibly related to recent surgery or injection site. 4. Sigmoid colon diverticulosis. Aortic Atherosclerosis (ICD10-I70.0). Electronically Signed   By: Greig Pique M.D.   On: 03/03/2024 16:19   VAS US  ABI WITH/WO TBI Result Date: 03/02/2024  LOWER EXTREMITY DOPPLER STUDY Patient Name:  MURVIN GIFT  Date of Exam:   03/01/2024 Medical Rec #: [id number]         Accession #:    [id number] Date of Birth: Jul 09, 1951          Patient Gender: M Patient  Age:   39 years Exam Location:  [facility] Procedure:      VAS US  ABI WITH/WO TBI Referring Phys: MIGNON GONFA --------------------------------------------------------------------------------  Indications: Ulceration, left calcaneal osteomyelitis High Risk Factors: Prior CVA.  Comparison Study: No prior study on file Performing Technologist: Rachel Pellet RVS  Examination Guidelines: A complete evaluation includes at minimum, Doppler waveform signals and systolic blood pressure reading at the level of bilateral brachial, anterior tibial, and posterior tibial arteries, when vessel segments are accessible. Bilateral testing is considered an integral part of a complete examination. Photoelectric Plethysmograph (PPG) waveforms and toe systolic pressure readings are included as required and additional duplex testing as needed. Limited examinations for reoccurring indications may be performed as noted.  ABI Findings: +---------+------------------+-----+--------+--------+ Right    Rt Pressure (mmHg)IndexWaveformComment  +---------+------------------+-----+--------+--------+ Brachial 142                    biphasic         +---------+------------------+-----+--------+--------+ PTA      115               0.81 biphasic         +---------+------------------+-----+--------+--------+ DP       114               0.80 biphasic         +---------+------------------+-----+--------+--------+ Great Toe58                0.41                  +---------+------------------+-----+--------+--------+ +---------+------------------+-----+-----------+-------+ Left     Lt Pressure (mmHg)IndexWaveform   Comment +---------+------------------+-----+-----------+-------+ Brachial 135                    biphasic           +---------+------------------+-----+-----------+-------+ PTA      118               0.83 multiphasic        +---------+------------------+-----+-----------+-------+ DP        113               0.80 multiphasic        +---------+------------------+-----+-----------+-------+ Great Toe54                0.38                    +---------+------------------+-----+-----------+-------+ +-------+-----------+-----------+------------+------------+ ABI/TBIToday's ABIToday's TBIPrevious ABIPrevious TBI +-------+-----------+-----------+------------+------------+ Right  0.81       0.41                                +-------+-----------+-----------+------------+------------+ Left   0.83  0.38                                +-------+-----------+-----------+------------+------------+  Summary: Right: Resting right ankle-brachial index indicates mild right lower extremity arterial disease. The right toe-brachial index is abnormal. Left: Resting left ankle-brachial index indicates mild left lower extremity arterial disease. The left toe-brachial index is abnormal. *See table(s) above for measurements and observations.  Electronically signed by Norman Serve on 03/02/2024 at 2:34:37 PM.    Final    CT CHEST WO CONTRAST Result Date: 02/29/2024 CLINICAL DATA:  Pneumonia EXAM: CT CHEST WITHOUT CONTRAST TECHNIQUE: Multidetector CT imaging of the chest was performed following the standard protocol without IV contrast. RADIATION DOSE REDUCTION: This exam was performed according to the departmental dose-optimization program which includes automated exposure control, adjustment of the mA and/or kV according to patient size and/or use of iterative reconstruction technique. COMPARISON:  Radiographs 02/28/2024 FINDINGS: Cardiovascular: Coronary, aortic arch, and branch vessel atherosclerotic vascular disease. Mitral valve calcification. Mediastinum/Nodes: A feeding tube terminates in the duodenal bulb. Lungs/Pleura: Airway thickening is present especially in both lower lobes with some degree of airway plugging more notable in the left lower lobe, and bandlike regions of volume loss  and mild airspace opacity in both lower lobes favoring atelectasis although aspiration pneumonitis is included in the differential diagnosis. Upper Abdomen: Unremarkable Musculoskeletal: Left greater than right degenerative glenohumeral arthropathy. IMPRESSION: 1. Airway thickening is present especially in both lower lobes with some degree of airway plugging more notable in the left lower lobe, and bandlike regions of volume loss and mild airspace opacity in both lower lobes favoring atelectasis although aspiration pneumonitis is included in the differential diagnosis. 2. Mitral valve calcification. 3. Left greater than right degenerative glenohumeral arthropathy. 4.  Aortic Atherosclerosis (ICD10-I70.0). Electronically Signed   By: Ryan Salvage M.D.   On: 02/29/2024 12:39   DG CHEST PORT 1 VIEW Result Date: 02/28/2024 CLINICAL DATA:  Pneumonia. EXAM: PORTABLE CHEST 1 VIEW COMPARISON:  Chest radiograph dated 02/25/2024. FINDINGS: Enteric tube extends below diaphragm with tip beyond the inferior margin of the image. Minimal left lung base atelectasis or infiltrate. Background of emphysema. No pleural effusion or pneumothorax. The cardiac silhouette is within normal limits. No acute osseous pathology. IMPRESSION: Minimal left lung base atelectasis or infiltrate. Electronically Signed   By: Vanetta Chou M.D.   On: 02/28/2024 12:36   EEG adult Result Date: 02/26/2024 Shelton Arlin KIDD, MD     02/26/2024 12:30 PM Patient Name: DYLLAN HUGHETT MRN: [id number] Epilepsy Attending: Arlin KIDD Shelton Referring Physician/Provider: Donnamarie Lebron PARAS, MD Date: 02/26/2024 Duration: 23.20 mins Patient history: 73yo M with ams. EEG to evaluate for seizure Level of alertness: sleep/ lethargic AEDs during EEG study: None Technical aspects: This EEG study was done with scalp electrodes positioned according to the 10-20 International system of electrode placement. Electrical activity was reviewed with band pass filter  of 1-70Hz , sensitivity of 7 uV/mm, display speed of 9mm/sec with a 60Hz  notched filter applied as appropriate. EEG data were recorded continuously and digitally stored.  Video monitoring was available and reviewed as appropriate. Description: EEG showed continuous generalized predominantly 5 to 7 Hz theta slowing admixed with intermittent 2 to 3 Hz delta slowing, at times with triphasic morphology.  Hyperventilation and photic stimulation were not performed.   ABNORMALITY - Continuous slow, generalized IMPRESSION: This study is suggestive of moderate diffuse encephalopathy. No seizures or epileptiform discharges were seen  throughout the recording. Arlin MALVA Krebs   MR HEEL LEFT W WO CONTRAST Result Date: 02/26/2024 CLINICAL DATA:  Osteomyelitis of the foot EXAM: MRI OF LOWER LEFT EXTREMITY WITHOUT AND WITH CONTRAST TECHNIQUE: Multiplanar, multisequence MR imaging of the left foot from the posterior ankle through the Lisfranc joint was performed both before and after administration of intravenous contrast. CONTRAST:  5mL GADAVIST  GADOBUTROL  1 MMOL/ML IV SOLN COMPARISON:  Radiographs 06/27/2024 FINDINGS: Bones/Joint/Cartilage Abnormal edema and enhancement in the posteroinferior calcaneus as on image 15 series 1043 compatible with osteomyelitis. Ligaments Suboptimal characterization due to imaging plane choices. Grossly intact. Muscles and Tendons Trace nonspecific edema the quadratus plantae muscle potentially from mild myositis. Mild distal Achilles tendinopathy. Soft tissues Deep ulceration along the posteroinferior heel extending to the periosteal margin of the posteroinferior calcaneus on image 14 series 1043. IMPRESSION: 1. 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 2. Trace nonspecific edema in the quadratus plantae muscle potentially from mild myositis. 3. Mild distal  Achilles tendinopathy. Electronically Signed   By: Ryan Salvage M.D.   On: 02/26/2024 10:52   MR Brain W and Wo Contrast Result Date: 02/25/2024 EXAM: MRI BRAIN WITH AND WITHOUT CONTRAST 02/25/2024 11:40:00 PM TECHNIQUE: Multiplanar multisequence MRI of the head/brain was performed with and without the administration of intravenous contrast. COMPARISON: 07/18/2023 CLINICAL HISTORY: Altered mental status, nontraumatic (Ped 0-17y). FINDINGS: BRAIN AND VENTRICLES: No acute infarct. No acute intracranial hemorrhage. No mass or abnormal enhancement. No midline shift. No hydrocephalus. The sella is unremarkable. Normal flow voids. Mild volume loss. Multifocal hyperintense T2-weighted signal within the cerebral white matter, most commonly due to chronic small vessel disease. ORBITS: No acute abnormality. SINUSES AND MASTOIDS: Right maxillary sinus mucosal thickening. No acute abnormality. BONES AND SOFT TISSUES: Normal bone marrow signal. No acute soft tissue abnormality. IMPRESSION: 1. No acute intracranial abnormality. 2. Mild volume loss and multifocal hyperintense T2-weighted signal within the cerebral white matter, most commonly due to chronic small vessel disease. 3. Right maxillary sinus mucosal thickening. Electronically signed by: Franky Stanford MD 02/25/2024 11:50 PM EDT RP Workstation: [id number]   DG Hip Unilat W or Wo Pelvis 2-3 Views Right Result Date: 02/25/2024 CLINICAL DATA:  Altered mental status. History of prior ORIF of the right femur. EXAM: DG HIP (WITH OR WITHOUT PELVIS) 2-3V RIGHT COMPARISON:  10/24/2023. FINDINGS: Diffuse osseous demineralization. No definite acute fracture or dislocation. Postoperative changes related to prior ORIF of a right femoral shaft fracture with evidence of interval hearing. Prior fixation of a right proximal femur intertrochanteric fracture. IMPRESSION: Postoperative changes related to prior ORIF of a right femoral shaft fracture with evidence of interval  hearing and prior fixation of a right proximal femur intertrochanteric fracture. No definite acute osseous abnormality. Electronically Signed   By: Harrietta Sherry M.D.   On: 02/25/2024 18:15   DG Foot Complete Left Result Date: 02/25/2024 CLINICAL DATA:  Foot ulcer. EXAM: LEFT FOOT - COMPLETE 3+ VIEW COMPARISON:  None Available. FINDINGS: Soft tissue ulceration and swelling of the heel with cortical indistinctness of the underlying calcaneal tuberosity. The remainder of the visualized bones appear intact. No evidence of dislocation. Mild first MTP joint space narrowing. IMPRESSION: Soft tissue ulceration and swelling of the heel with cortical indistinctness of the underlying calcaneal tuberosity, concerning for osteomyelitis. Electronically Signed   By: Harrietta Sherry M.D.   On: 02/25/2024 18:10   CT Head Wo Contrast Result Date: 02/25/2024 CLINICAL  DATA:  Altered mental status EXAM: CT HEAD WITHOUT CONTRAST TECHNIQUE: Contiguous axial images were obtained from the base of the skull through the vertex without intravenous contrast. RADIATION DOSE REDUCTION: This exam was performed according to the departmental dose-optimization program which includes automated exposure control, adjustment of the mA and/or kV according to patient size and/or use of iterative reconstruction technique. COMPARISON:  CT brain 11/07/2023 FINDINGS: Brain: No acute territorial infarction, hemorrhage or intracranial mass. Mild white matter hypodensity likely chronic small vessel ischemic change. Mild atrophy. Stable ventricle size. Vascular: No hyperdense vessels.  Carotid vascular calcification Skull: No fracture Sinuses/Orbits: Opacified right maxillary sinus with hyperdense secretions or possible fungal colonization as before. Other: None IMPRESSION: 1. No CT evidence for acute intracranial abnormality. 2. Mild atrophy and chronic small vessel ischemic changes of the white matter. 3. Chronic right maxillary sinus disease.  Electronically Signed   By: Luke Bun M.D.   On: 02/25/2024 16:21   DG Chest Portable 1 View Result Date: 02/25/2024 CLINICAL DATA:  Altered mental status. EXAM: PORTABLE CHEST 1 VIEW COMPARISON:  Chest radiograph dated 11/07/2023. FINDINGS: No focal consolidation, pleural effusion or pneumothorax. The cardiac silhouette is within normal limits. No acute osseous pathology. IMPRESSION: No active disease. Electronically Signed   By: Vanetta Chou M.D.   On: 02/25/2024 14:43    Microbiology: Results for orders placed or performed during the hospital encounter of 02/25/24  Resp panel by RT-PCR (RSV, Flu A&B, Covid) Anterior Nasal Swab     Status: None   Collection Time: 02/25/24  3:49 PM   Specimen: Anterior Nasal Swab  Result Value Ref Range Status   SARS Coronavirus 2 by RT PCR NEGATIVE NEGATIVE Final   Influenza A by PCR NEGATIVE NEGATIVE Final   Influenza B by PCR NEGATIVE NEGATIVE Final    Comment: (NOTE) The Xpert Xpress SARS-CoV-2/FLU/RSV plus assay is intended as an aid in the diagnosis of influenza from Nasopharyngeal swab specimens and should not be used as a sole basis for treatment. Nasal washings and aspirates are unacceptable for Xpert Xpress SARS-CoV-2/FLU/RSV testing.  Fact Sheet for Patients: BloggerCourse.com  Fact Sheet for Healthcare Providers: SeriousBroker.it  This test is not yet approved or cleared by the [place]  FDA and has been authorized for detection and/or diagnosis of SARS-CoV-2 by FDA under an Emergency Use Authorization (EUA). This EUA will remain in effect (meaning this test can be used) for the duration of the COVID-19 declaration under Section 564(b)(1) of the Act, 21 U.S.C. section 360bbb-3(b)(1), unless the authorization is terminated or revoked.     Resp Syncytial Virus by PCR NEGATIVE NEGATIVE Final    Comment: (NOTE) Fact Sheet for  Patients: BloggerCourse.com  Fact Sheet for Healthcare Providers: SeriousBroker.it  This test is not yet approved or cleared by the [place]  FDA and has been authorized for detection and/or diagnosis of SARS-CoV-2 by FDA under an Emergency Use Authorization (EUA). This EUA will remain in effect (meaning this test can be used) for the duration of the COVID-19 declaration under Section 564(b)(1) of the Act, 21 U.S.C. section 360bbb-3(b)(1), unless the authorization is terminated or revoked.  Performed at [facility], [address] [place], [place] [address]   Blood culture (routine x 2)     Status: None   Collection Time: 02/25/24  5:00 PM   Specimen: BLOOD RIGHT HAND  Result Value Ref Range Status   Specimen Description BLOOD RIGHT HAND  Final   Special Requests   Final  BOTTLES DRAWN AEROBIC ONLY Blood Culture results may not be optimal due to an inadequate volume of blood received in culture bottles   Culture   Final    NO GROWTH 5 DAYS Performed at [facility], [address] [place], [place] [address]    Report Status 03/01/2024 FINAL  Final  Blood culture (routine x 2)     Status: None   Collection Time: 02/25/24  5:10 PM   Specimen: BLOOD LEFT HAND  Result Value Ref Range Status   Specimen Description BLOOD LEFT HAND  Final   Special Requests   Final    BOTTLES DRAWN AEROBIC ONLY Blood Culture results may not be optimal due to an inadequate volume of blood received in culture bottles   Culture   Final    NO GROWTH 5 DAYS Performed at [facility], [address] [place], [place] [address]    Report Status 03/01/2024 FINAL  Final  Respiratory (~20 pathogens) panel by PCR     Status: None   Collection Time: 03/02/24 10:39 AM   Specimen: Nasopharyngeal Swab; Respiratory  Result Value Ref Range Status   Adenovirus NOT DETECTED NOT DETECTED Final   Coronavirus 229E NOT DETECTED NOT DETECTED  Final    Comment: (NOTE) The Coronavirus on the Respiratory Panel, DOES NOT test for the novel  Coronavirus (2019 nCoV)    Coronavirus HKU1 NOT DETECTED NOT DETECTED Final   Coronavirus NL63 NOT DETECTED NOT DETECTED Final   Coronavirus OC43 NOT DETECTED NOT DETECTED Final   Metapneumovirus NOT DETECTED NOT DETECTED Final   Rhinovirus / Enterovirus NOT DETECTED NOT DETECTED Final   Influenza A NOT DETECTED NOT DETECTED Final   Influenza B NOT DETECTED NOT DETECTED Final   Parainfluenza Virus 1 NOT DETECTED NOT DETECTED Final   Parainfluenza Virus 2 NOT DETECTED NOT DETECTED Final   Parainfluenza Virus 3 NOT DETECTED NOT DETECTED Final   Parainfluenza Virus 4 NOT DETECTED NOT DETECTED Final   Respiratory Syncytial Virus NOT DETECTED NOT DETECTED Final   Bordetella pertussis NOT DETECTED NOT DETECTED Final   Bordetella Parapertussis NOT DETECTED NOT DETECTED Final   Chlamydophila pneumoniae NOT DETECTED NOT DETECTED Final   Mycoplasma pneumoniae NOT DETECTED NOT DETECTED Final    Comment: Performed at [facility], [address] [place], [place] [address]    Labs: CBC: Recent Labs  Lab 03/10/24 0444 03/12/24 0414 03/13/24 0315 03/15/24 1100  WBC 8.6 9.0 7.5 7.0  HGB 10.3* 10.8* 10.0* 10.7*  HCT 31.6* 33.2* 30.5* 32.6*  MCV 94.9 96.2 95.0 94.8  PLT 545* 558* 540* 535*   Basic Metabolic Panel: Recent Labs  Lab 03/10/24 0444 03/12/24 0414 03/13/24 0315 03/15/24 1100  NA 136 134* 139 138  K 4.1 4.5 4.4 4.0  CL 104 102 105 105  CO2 24 24 24 23   GLUCOSE 133* 129* 112* 112*  BUN 39* 38* 42* 34*  CREATININE 0.76 0.79 0.89 0.90  CALCIUM  8.9 8.9 8.9 9.1  MG 2.2  --   --   --   PHOS 3.9  --   --   --    Liver Function Tests: Recent Labs  Lab 03/12/24 0414 03/13/24 0315 03/15/24 1100  AST 19 17 23   ALT 21 17 14   ALKPHOS 111 114 86  BILITOT 0.4 0.4 0.6  PROT 6.3* 6.0* 6.4*  ALBUMIN 2.8* 2.7* 2.9*   CBG: Recent Labs  Lab 03/13/24 0607 03/14/24 0133  03/14/24 0620 03/15/24 2107  03/16/24 0616  GLUCAP 121* 94 91 132* 114*    Discharge time spent: approximately 45 minutes spent on discharge counseling, evaluation of patient on day of discharge, and coordination of discharge planning with nursing, social work, pharmacy and case management  Signed: Lonni SHAUNNA Dalton, MD [facility] 03/16/2024

## 2024-03-16 NOTE — Progress Notes (Signed)
 Physical Therapy Treatment Patient Details Name: Ernest Campbell MRN: [id number] DOB: 11/01/1950 Today's Date: 03/16/2024   History of Present Illness 73 yo male [facility]/25/24 for AMS. MRI suggests L heel osteomyelitis. Family declined surgical intervention. PMH includes recent CVA, R hip ORIF 10/24/23, TBI, dementia, and parkinsonism.    PT Comments  Pt received in supine and lethargic. Able to arouse with verbal and tactile stimulation. Pt requiring two person total assist for bed mobility and slide board transfer from bed to chair. Continue to recommend hoyer lift out of bed if discharging home for decreased caregiver burden as well as air mattress and q2 turns for pressure relief.    If plan is discharge home, recommend the following: Two people to help with walking and/or transfers;Two people to help with bathing/dressing/bathroom;Assistance with cooking/housework;Assistance with feeding;Direct supervision/assist for medications management;Direct supervision/assist for financial management;Assist for transportation;Help with stairs or ramp for entrance;Supervision due to cognitive status   Can travel by private vehicle        Equipment Recommendations  Hospital bed;Hoyer lift (air mattress)    Recommendations for Other Services       Precautions / Restrictions Precautions Precautions: Fall Recall of Precautions/Restrictions: Impaired Restrictions Weight Bearing Restrictions Per Provider Order: No     Mobility  Bed Mobility Overal bed mobility: Needs Assistance Bed Mobility: Sit to Supine, Supine to Sit     Supine to sit: Total assist, +2 for physical assistance Sit to supine: Total assist, +2 for physical assistance        Transfers Overall transfer level: Needs assistance Equipment used: Sliding board Transfers: Bed to chair/wheelchair/BSC Sit to Stand: Via lift equipment, +2 physical assistance, Total assist          Lateral/Scoot Transfers: Total  assist, +2 physical assistance General transfer comment: using slideboard, but overall needing total assist +2 to scoot towards L side    Ambulation/Gait                   Stairs             Wheelchair Mobility     Tilt Bed    Modified Rankin (Stroke Patients Only)       Balance Overall balance assessment: Needs assistance Sitting-balance support: Feet supported Sitting balance-Leahy Scale: Poor Sitting balance - Comments: lateral lean preference towards R side, MaxA assist fading to MinA for safety. pt does not attempt to correct balance. Postural control: Right lateral lean                                  Communication Communication Communication: Impaired Factors Affecting Communication: Difficulty expressing self;Reduced clarity of speech  Cognition Arousal: Lethargic (improved alertness once EOB) Behavior During Therapy: Flat affect   PT - Cognitive impairments: History of cognitive impairments, Awareness, Memory, Attention, Initiation, Sequencing, Problem solving, Safety/Judgement                         Following commands: Impaired Following commands impaired: Follows one step commands inconsistently, Follows one step commands with increased time    Cueing Cueing Techniques: Verbal cues, Gestural cues, Tactile cues, Visual cues  Exercises      General Comments        Pertinent Vitals/Pain Pain Assessment Pain Assessment: Faces Faces Pain Scale: Hurts little more Pain Location: grimaces with movement, unable to localize Pain Descriptors / Indicators: Discomfort  Pain Intervention(s): Monitored during session, Repositioned, Limited activity within patient's tolerance    Home Living                          Prior Function            PT Goals (current goals can now be found in the care plan section) Acute Rehab PT Goals Potential to Achieve Goals: Fair Progress towards PT goals: Not progressing toward  goals - comment    Frequency    Min 1X/week      PT Plan      Co-evaluation PT/OT/SLP Co-Evaluation/Treatment: Yes Reason for Co-Treatment: For patient/therapist safety;To address functional/ADL transfers;Complexity of the patient's impairments (multi-system involvement);Necessary to address cognition/behavior during functional activity PT goals addressed during session: Mobility/safety with mobility;Balance OT goals addressed during session: ADL's and self-care;Strengthening/ROM      AM-PAC PT 6 Clicks Mobility   Outcome Measure  Help needed turning from your back to your side while in a flat bed without using bedrails?: Total Help needed moving from lying on your back to sitting on the side of a flat bed without using bedrails?: Total Help needed moving to and from a bed to a chair (including a wheelchair)?: Total Help needed standing up from a chair using your arms (e.g., wheelchair or bedside chair)?: Total Help needed to walk in hospital room?: Total Help needed climbing 3-5 steps with a railing? : Total 6 Click Score: 6    End of Session Equipment Utilized During Treatment: Gait belt Activity Tolerance: Patient tolerated treatment well Patient left: in chair;with call bell/phone within reach;with chair alarm set Nurse Communication: Mobility status;Need for lift equipment PT Visit Diagnosis: Muscle weakness (generalized) (M62.81);Unsteadiness on feet (R26.81)     Time: 9164-9093 PT Time Calculation (min) (ACUTE ONLY): 31 min  Charges:    $Therapeutic Activity: 8-22 mins PT General Charges $$ ACUTE PT VISIT: 1 Visit                     Aleck Daring, PT, DPT Acute Rehabilitation Services Office [phone]    Alayne ONEIDA Daring 03/16/2024, 12:48 PM

## 2024-03-16 NOTE — Progress Notes (Signed)
 Occupational Therapy Treatment Patient Details Name: PARKS CZAJKOWSKI MRN: [id number] DOB: 1951/05/18 Today's Date: 03/16/2024   History of present illness 73 yo male presents to [facility] on 02/25/24 for AMS. MRI suggests L heel osteomyelitis. Family declined surgical intervention. PMH includes recent CVA, R hip ORIF 10/24/23, TBI, dementia, and parkinsonism.   OT comments  Pt in recliner and RN asking for assist to transfer back to bed.  Hoyer lift not available, +2 assist for lateral scoot from recliner to EOB.  Pt requires assist to lean towards L side in recliner before transfer.  Continue to recommend max HH services (as family not open to rehab options), bed level Adls.  Will follow acutely.       If plan is discharge home, recommend the following:  Two people to help with walking and/or transfers;Two people to help with bathing/dressing/bathroom;Assistance with cooking/housework;Direct supervision/assist for financial management;Assist for transportation;Help with stairs or ramp for entrance;Direct supervision/assist for medications management;Assistance with feeding;Supervision due to cognitive status   Equipment Recommendations  Hospital bed;Other (comment);Hoyer lift (air mattress overlay on hospital bed)    Recommendations for Other Services      Precautions / Restrictions Precautions Precautions: Fall Recall of Precautions/Restrictions: Impaired Restrictions Weight Bearing Restrictions Per Provider Order: No       Mobility Bed Mobility Overal bed mobility: Needs Assistance Bed Mobility: Supine to Sit     Supine to sit: Total assist, +2 for physical assistance Sit to supine: Total assist, +2 for physical assistance        Transfers Overall transfer level: Needs assistance Equipment used: Sliding board Transfers: Bed to chair/wheelchair/BSC Sit to Stand: Via lift equipment, +2 physical assistance, Total assist          Lateral/Scoot Transfers: Total assist, +2  physical assistance General transfer comment: assisted back to bed to with nursing assist, towards L side total assist +2     Balance Overall balance assessment: Needs assistance Sitting-balance support: Feet supported Sitting balance-Leahy Scale: Poor Sitting balance - Comments: lateral lean preference towards R side, MaxA assist fading to MinA for safety. pt does not attempt to correct balance. Postural control: Right lateral lean                                 ADL either performed or assessed with clinical judgement   ADL                                              Extremity/Trunk Assessment              Vision       Perception     Praxis     Communication Communication Communication: Impaired Factors Affecting Communication: Difficulty expressing self;Reduced clarity of speech   Cognition Arousal: Lethargic Behavior During Therapy: Flat affect Cognition: Difficult to assess Difficult to assess due to: Impaired communication           OT - Cognition Comments: pt mumbles and attempts to verbalize but very difficult to understand. poor initation to tasks                 Following commands: Impaired Following commands impaired: Follows one step commands inconsistently, Follows one step commands with increased time      Cueing   Cueing Techniques: Verbal cues, Gestural  cues, Tactile cues, Visual cues  Exercises      Shoulder Instructions       General Comments family in room and supportive    Pertinent Vitals/ Pain       Pain Assessment Pain Assessment: Faces Faces Pain Scale: Hurts little more Pain Location: grimaces with movement, unable to localize Pain Descriptors / Indicators: Discomfort Pain Intervention(s): Limited activity within patient's tolerance, Monitored during session, Repositioned  Home Living                                          Prior Functioning/Environment               Frequency  Min 1X/week        Progress Toward Goals  OT Goals(current goals can now be found in the care plan section)  Progress towards OT goals: Progressing toward goals (slowly)  Acute Rehab OT Goals Patient Stated Goal: home OT Goal Formulation: With family Time For Goal Achievement: 03/25/24 Potential to Achieve Goals: Fair  Plan      Co-evaluation      Reason for Co-Treatment: For patient/therapist safety;To address functional/ADL transfers;Complexity of the patient's impairments (multi-system involvement);Necessary to address cognition/behavior during functional activity PT goals addressed during session: Mobility/safety with mobility;Balance OT goals addressed during session: ADL's and self-care;Strengthening/ROM      AM-PAC OT 6 Clicks Daily Activity     Outcome Measure   Help from another person eating meals?: Total Help from another person taking care of personal grooming?: Total Help from another person toileting, which includes using toliet, bedpan, or urinal?: Total Help from another person bathing (including washing, rinsing, drying)?: Total Help from another person to put on and taking off regular upper body clothing?: Total Help from another person to put on and taking off regular lower body clothing?: Total 6 Click Score: 6    End of Session Equipment Utilized During Treatment: Gait belt  OT Visit Diagnosis: Other abnormalities of gait and mobility (R26.89);Muscle weakness (generalized) (M62.81);Other symptoms and signs involving cognitive function   Activity Tolerance Patient tolerated treatment well   Patient Left in bed;with call bell/phone within reach;with family/visitor present;with nursing/sitter in room   Nurse Communication Mobility status        Time: 1010-1019 OT Time Calculation (min): 9 min  Charges: OT General Charges $OT Visit: 1 Visit OT Treatments $Self Care/Home Management : 8-22 mins  Etta NOVAK, OT Acute  Rehabilitation Services Office [phone] Secure Chat Preferred    Etta GORMAN Hope 03/16/2024, 1:31 PM

## 2024-03-16 NOTE — Plan of Care (Signed)

## 2024-03-16 NOTE — TOC Transition Note (Signed)
 [facility] of [facility]) - Discharge Note   Patient Details  Name: Ernest Campbell MRN: [id number] Date of Birth: 1951-02-06  Transition of [facility]) CM/SW Contact:  [name], RN Phone Number: 03/16/2024, 11:50 AM   Clinical Narrative:     Pt is discharging home with resumption of home health services through [facility]. Resumption orders are entered.  Steady lift for home ordered through [employer] and will be delivered to the home today.  Pt will transport home via [facility]. CM has verified the home address and arranged transport.  Bedside RN to make sure d/c medications provided to spouse before pt leaves.   Final next level of care: Home w Home Health Services Barriers to Discharge: No Barriers Identified   Patient Goals and CMS Choice   CMS Medicare.gov Compare Post Acute Care list provided to:: Patient Represenative (must comment) Choice offered to / list presented to : Spouse      Discharge Placement                       Discharge Plan and Services Additional resources added to the After Visit Summary for                  DME Arranged:  (Steady) DME Agency: [employer] Date DME Agency Contacted: 03/13/24   Representative spoke with at DME Agency: Darlyn HH Arranged: PT, OT, RN   Date HH Agency Contacted: 03/16/24   Representative spoke with at [facility] Agency: Jon  Social Drivers of Health (SDOH) Interventions SDOH Screenings   Food Insecurity: Patient Unable To Answer (02/27/2024)  Housing: Low Risk  (03/11/2024)  Transportation Needs: Patient Unable To Answer (02/27/2024)  Utilities: Patient Unable To Answer (02/27/2024)  Financial Resource Strain: Low Risk  (10/20/2022)   Received from [facility]  Physical Activity: Unknown (10/20/2022)   Received from [facility]  Social Connections: Unknown (02/27/2024)  Stress: Patient Declined (10/20/2022)   Received from [facility]  Tobacco Use: High Risk (02/25/2024)     Readmission Risk  Interventions    11/08/2023    1:14 PM  Readmission Risk Prevention Plan  Transportation Screening   Medication Review (RN Care Manager)   PCP or Specialist appointment within 3-5 days of discharge   PCP/Specialist Appt Not Complete comments   HRI or Home Care Consult   SW Recovery Care/Counseling Consult   Palliative Care Screening   Skilled Nursing Facility      Information is confidential and restricted. Go to Review Flowsheets to unlock data.

## 2024-03-16 NOTE — Progress Notes (Signed)
 Occupational Therapy Treatment Patient Details Name: Ernest Campbell MRN: [id number] DOB: 1951-07-28 Today's Date: 03/16/2024   History of present illness 73 yo male presents [facility] on 02/25/24 for AMS. MRI suggests L heel osteomyelitis. Family declined surgical intervention. PMH includes recent CVA, R hip ORIF 10/24/23, TBI, dementia, and parkinsonism.   OT comments  Patient supine in bed, lethargic.  Total assist +2 to transition to EOB, sitting EOB with at best min assist.  L lateral leans on forearm to support more midline positioning, but only briefly.  Pt requires overall total assist +2 for lateral scoot/squat pivot to recliner using slide board for ease.  At this time would not recommend slide board for home use, and continue to recommend hoyer lift for safety.  Pt attempts to assist with L UE during washing face and suction oral care but overall needs total assist.  RN aware of pt position in recliner. Will follow acutely.       If plan is discharge home, recommend the following:  Two people to help with walking and/or transfers;Two people to help with bathing/dressing/bathroom;Assistance with cooking/housework;Direct supervision/assist for financial management;Assist for transportation;Help with stairs or ramp for entrance;Direct supervision/assist for medications management;Assistance with feeding;Supervision due to cognitive status   Equipment Recommendations  Hospital bed;Other (comment);Hoyer lift (air matters overlay on hospital bed)    Recommendations for Other Services      Precautions / Restrictions Precautions Precautions: Fall Recall of Precautions/Restrictions: Impaired Restrictions Weight Bearing Restrictions Per Provider Order: No       Mobility Bed Mobility Overal bed mobility: Needs Assistance Bed Mobility: Sit to Supine, Supine to Sit     Supine to sit: Total assist, +2 for physical assistance Sit to supine: Total assist, +2 for physical assistance         Transfers Overall transfer level: Needs assistance Equipment used: Sliding board Transfers: Bed to chair/wheelchair/BSC Sit to Stand: Via lift equipment, +2 physical assistance, Total assist          Lateral/Scoot Transfers: Total assist, +2 physical assistance General transfer comment: using slideboard, but overall needing total assist +2 to scoot towards L side     Balance Overall balance assessment: Needs assistance Sitting-balance support: Feet supported Sitting balance-Leahy Scale: Poor Sitting balance - Comments: lateral lean preference towards R side, MaxA assist fading to MinA for safety. pt does not attempt to correct balance. Postural control: Right lateral lean                                 ADL either performed or assessed with clinical judgement   ADL Overall ADL's : Needs assistance/impaired     Grooming: Wash/dry face;Oral care;Bed level;Sitting;Total assistance (suction oral care) Grooming Details (indicate cue type and reason): pt reaching for mouth with L hand during suction oral care but overall requires total assist to complete task             Lower Body Dressing: Total assistance;+2 for physical assistance   Toilet Transfer: Total assistance;+2 for physical assistance Toilet Transfer Details (indicate cue type and reason): slideboard simulated to drop arm recliner towards L side         Functional mobility during ADLs: Total assistance;+2 for physical assistance General ADL Comments: Pt not able to initate tasks, no effort despite hand over hand assist to promote initation. Total A to total A+2 for ADLs.    Extremity/Trunk Assessment  Vision   Additional Comments: will continue to  monitor   Perception     Praxis     Communication Communication Communication: Impaired Factors Affecting Communication: Difficulty expressing self;Reduced clarity of speech   Cognition Arousal: Lethargic (improved alertness  once EOB) Behavior During Therapy: Flat affect Cognition: Difficult to assess Difficult to assess due to: Impaired communication           OT - Cognition Comments: pt mumbles and attempts to verbalize but very difficult to understand. poor initation to tasks                 Following commands: Impaired Following commands impaired: Follows one step commands inconsistently, Follows one step commands with increased time      Cueing   Cueing Techniques: Verbal cues, Gestural cues, Tactile cues, Visual cues  Exercises      Shoulder Instructions       General Comments      Pertinent Vitals/ Pain       Pain Assessment Pain Assessment: Faces Faces Pain Scale: Hurts little more Pain Location: grimaces with movement, unable to localize Pain Descriptors / Indicators: Discomfort Pain Intervention(s): Limited activity within patient's tolerance, Monitored during session, Repositioned  Home Living                                          Prior Functioning/Environment              Frequency  Min 1X/week        Progress Toward Goals  OT Goals(current goals can now be found in the care plan section)  Progress towards OT goals: Progressing toward goals (slowly)  Acute Rehab OT Goals Patient Stated Goal: get back home OT Goal Formulation: With family Time For Goal Achievement: 03/25/24 Potential to Achieve Goals: Fair  Plan      Co-evaluation    PT/OT/SLP Co-Evaluation/Treatment: Yes Reason for Co-Treatment: For patient/therapist safety;To address functional/ADL transfers;Complexity of the patient's impairments (multi-system involvement);Necessary to address cognition/behavior during functional activity PT goals addressed during session: Mobility/safety with mobility;Balance OT goals addressed during session: ADL's and self-care;Strengthening/ROM      AM-PAC OT 6 Clicks Daily Activity     Outcome Measure   Help from another person  eating meals?: Total Help from another person taking care of personal grooming?: Total Help from another person toileting, which includes using toliet, bedpan, or urinal?: Total Help from another person bathing (including washing, rinsing, drying)?: Total Help from another person to put on and taking off regular upper body clothing?: Total Help from another person to put on and taking off regular lower body clothing?: Total 6 Click Score: 6    End of Session Equipment Utilized During Treatment: Gait belt  OT Visit Diagnosis: Other abnormalities of gait and mobility (R26.89);Muscle weakness (generalized) (M62.81);Other symptoms and signs involving cognitive function   Activity Tolerance Patient tolerated treatment well   Patient Left in chair;with call bell/phone within reach;with chair alarm set;with family/visitor present   Nurse Communication Mobility status        Time: 9161-9094 OT Time Calculation (min): 27 min  Charges: OT General Charges $OT Visit: 1 Visit OT Treatments $Self Care/Home Management : 8-22 mins  Etta NOVAK, OT Acute Rehabilitation Services Office [phone] Secure Chat Preferred    Etta GORMAN Hope 03/16/2024, 10:07 AM

## 2024-03-17 ENCOUNTER — Telehealth: Payer: Self-pay | Admitting: Neurology

## 2024-03-17 ENCOUNTER — Other Ambulatory Visit (HOSPITAL_COMMUNITY): Payer: Self-pay

## 2024-03-17 NOTE — Telephone Encounter (Signed)
 His wife told Tonya at [facility] they are going to hold off on scheduling the pet scan due to his recent hospitalization and she will call when they're ready to schedule it. That scheduling phone number is [phone].

## 2024-03-18 ENCOUNTER — Other Ambulatory Visit: Payer: Self-pay | Admitting: Neurology

## 2024-03-30 ENCOUNTER — Ambulatory Visit: Payer: Self-pay | Admitting: Infectious Disease

## 2024-04-06 DEATH — deceased

## 2024-04-17 ENCOUNTER — Telehealth: Payer: Self-pay | Admitting: Neurology

## 2024-04-17 NOTE — Telephone Encounter (Signed)
 Wife has reported pt passed on 04/20/2024 , she would like a call from RN to discuss.

## 2024-04-20 NOTE — Telephone Encounter (Signed)
 Sympathy card created for pod labled, provided to pod, and lvm 1st attempt by hf 04/20/24

## 2024-04-21 NOTE — Telephone Encounter (Signed)
 Card placed in outgoing mail

## 2024-05-21 ENCOUNTER — Encounter (HOSPITAL_COMMUNITY): Payer: Self-pay

## 2024-05-25 ENCOUNTER — Ambulatory Visit: Admitting: Neurology

## 2024-06-23 ENCOUNTER — Telehealth: Payer: Self-pay | Admitting: Neurology

## 2024-06-23 NOTE — Telephone Encounter (Signed)
 I called and LM (but not sure it took).

## 2024-06-23 NOTE — Telephone Encounter (Signed)
 Pt wife called to request to speak to Nurse asap. Pt wife has some concern and question she would like to discuss with  Nurse or MD

## 2024-06-23 NOTE — Telephone Encounter (Signed)
 Lmvm that returned call (2nd).

## 2024-06-24 NOTE — Telephone Encounter (Signed)
 I called and mobile, stated not available then beeped.  Will close message now.
